# Patient Record
Sex: Female | Born: 1945 | Race: White | Hispanic: Yes | Marital: Married | Smoking: Never smoker
Health system: Southern US, Community
[De-identification: ages and names within clinical notes are randomized; demographics above are authoritative.]

## PROBLEM LIST (undated history)

## (undated) ENCOUNTER — Emergency Department (HOSPITAL_COMMUNITY): Discharge status: Absent without leave | Payer: Medicare (Managed Care)

## (undated) DIAGNOSIS — K219 Gastro-esophageal reflux disease without esophagitis: Secondary | ICD-10-CM

## (undated) DIAGNOSIS — E7800 Pure hypercholesterolemia, unspecified: Secondary | ICD-10-CM

## (undated) DIAGNOSIS — I10 Essential (primary) hypertension: Secondary | ICD-10-CM

## (undated) DIAGNOSIS — G8929 Other chronic pain: Secondary | ICD-10-CM

## (undated) DIAGNOSIS — N200 Calculus of kidney: Secondary | ICD-10-CM

## (undated) DIAGNOSIS — E079 Disorder of thyroid, unspecified: Secondary | ICD-10-CM

## (undated) DIAGNOSIS — G459 Transient cerebral ischemic attack, unspecified: Secondary | ICD-10-CM

## (undated) DIAGNOSIS — Z87442 Personal history of urinary calculi: Secondary | ICD-10-CM

## (undated) DIAGNOSIS — M549 Dorsalgia, unspecified: Secondary | ICD-10-CM

## (undated) DIAGNOSIS — M1990 Unspecified osteoarthritis, unspecified site: Secondary | ICD-10-CM

## (undated) DIAGNOSIS — F410 Panic disorder [episodic paroxysmal anxiety] without agoraphobia: Secondary | ICD-10-CM

## (undated) DIAGNOSIS — F329 Major depressive disorder, single episode, unspecified: Secondary | ICD-10-CM

## (undated) DIAGNOSIS — F449 Dissociative and conversion disorder, unspecified: Secondary | ICD-10-CM

## (undated) DIAGNOSIS — F32A Depression, unspecified: Secondary | ICD-10-CM

## (undated) DIAGNOSIS — Z86718 Personal history of other venous thrombosis and embolism: Secondary | ICD-10-CM

## (undated) HISTORY — PX: THYROID SURGERY: SHX805

## (undated) HISTORY — PX: CYSTOSCOPY: SUR368

## (undated) HISTORY — PX: ABDOMINAL HYSTERECTOMY: SHX81

## (undated) HISTORY — PX: APPENDECTOMY: SHX54

## (undated) HISTORY — PX: CHOLECYSTECTOMY: SHX55

---

## 1968-07-23 HISTORY — PX: APPENDECTOMY: SHX54

## 1977-07-23 HISTORY — PX: STERILIZATION: SHX533

## 1978-07-23 HISTORY — PX: OVARY SURGERY: SHX727

## 1982-07-23 HISTORY — PX: LAPAROSCOPY: SHX197

## 1988-07-23 HISTORY — PX: CYSTOSCOPY: SUR368

## 2005-09-10 ENCOUNTER — Ambulatory Visit: Payer: Self-pay | Admitting: Family Medicine

## 2005-09-11 ENCOUNTER — Ambulatory Visit: Payer: Self-pay | Admitting: Family Medicine

## 2005-09-12 ENCOUNTER — Ambulatory Visit: Payer: Self-pay | Admitting: *Deleted

## 2005-10-08 ENCOUNTER — Ambulatory Visit: Payer: Self-pay | Admitting: Family Medicine

## 2005-12-21 DIAGNOSIS — Z8711 Personal history of peptic ulcer disease: Secondary | ICD-10-CM | POA: Insufficient documentation

## 2006-01-16 ENCOUNTER — Ambulatory Visit: Payer: Self-pay | Admitting: Family Medicine

## 2006-01-24 ENCOUNTER — Ambulatory Visit: Payer: Self-pay | Admitting: Family Medicine

## 2006-03-28 ENCOUNTER — Ambulatory Visit: Payer: Self-pay | Admitting: Family Medicine

## 2006-05-21 ENCOUNTER — Ambulatory Visit: Payer: Self-pay | Admitting: Family Medicine

## 2006-06-26 ENCOUNTER — Ambulatory Visit: Payer: Self-pay | Admitting: Family Medicine

## 2006-09-12 ENCOUNTER — Ambulatory Visit (HOSPITAL_COMMUNITY): Admission: RE | Admit: 2006-09-12 | Discharge: 2006-09-12 | Payer: Self-pay | Admitting: Family Medicine

## 2007-03-03 ENCOUNTER — Ambulatory Visit: Payer: Self-pay | Admitting: Internal Medicine

## 2007-03-03 DIAGNOSIS — S139XXA Sprain of joints and ligaments of unspecified parts of neck, initial encounter: Secondary | ICD-10-CM | POA: Insufficient documentation

## 2007-03-03 DIAGNOSIS — G43909 Migraine, unspecified, not intractable, without status migrainosus: Secondary | ICD-10-CM | POA: Insufficient documentation

## 2007-03-04 ENCOUNTER — Ambulatory Visit (HOSPITAL_COMMUNITY): Admission: RE | Admit: 2007-03-04 | Discharge: 2007-03-04 | Payer: Self-pay | Admitting: Internal Medicine

## 2007-03-04 ENCOUNTER — Ambulatory Visit: Payer: Self-pay | Admitting: Internal Medicine

## 2007-03-04 ENCOUNTER — Encounter (INDEPENDENT_AMBULATORY_CARE_PROVIDER_SITE_OTHER): Payer: Self-pay | Admitting: Nurse Practitioner

## 2007-03-04 LAB — CONVERTED CEMR LAB
ALT: 12 units/L (ref 0–35)
Basophils Absolute: 0 10*3/uL (ref 0.0–0.1)
CO2: 23 meq/L (ref 19–32)
Chloride: 104 meq/L (ref 96–112)
Eosinophils Relative: 3 % (ref 0–5)
Lymphocytes Relative: 16 % (ref 12–46)
Neutro Abs: 5.9 10*3/uL (ref 1.7–7.7)
Neutrophils Relative %: 75 % (ref 43–77)
Platelets: 271 10*3/uL (ref 150–400)
RDW: 14.5 % — ABNORMAL HIGH (ref 11.5–14.0)
Sodium: 140 meq/L (ref 135–145)
TSH: 0.979 microintl units/mL (ref 0.350–5.50)
Total Bilirubin: 0.8 mg/dL (ref 0.3–1.2)
Total Protein: 7.2 g/dL (ref 6.0–8.3)

## 2007-03-05 ENCOUNTER — Encounter (INDEPENDENT_AMBULATORY_CARE_PROVIDER_SITE_OTHER): Payer: Self-pay | Admitting: Nurse Practitioner

## 2007-03-07 ENCOUNTER — Encounter (INDEPENDENT_AMBULATORY_CARE_PROVIDER_SITE_OTHER): Payer: Self-pay | Admitting: Nurse Practitioner

## 2007-03-07 DIAGNOSIS — M1710 Unilateral primary osteoarthritis, unspecified knee: Secondary | ICD-10-CM | POA: Insufficient documentation

## 2007-03-07 DIAGNOSIS — K219 Gastro-esophageal reflux disease without esophagitis: Secondary | ICD-10-CM | POA: Insufficient documentation

## 2007-03-07 DIAGNOSIS — E785 Hyperlipidemia, unspecified: Secondary | ICD-10-CM | POA: Insufficient documentation

## 2007-03-17 ENCOUNTER — Telehealth (INDEPENDENT_AMBULATORY_CARE_PROVIDER_SITE_OTHER): Payer: Self-pay | Admitting: *Deleted

## 2007-04-08 ENCOUNTER — Telehealth (INDEPENDENT_AMBULATORY_CARE_PROVIDER_SITE_OTHER): Payer: Self-pay | Admitting: *Deleted

## 2007-04-09 ENCOUNTER — Encounter (INDEPENDENT_AMBULATORY_CARE_PROVIDER_SITE_OTHER): Payer: Self-pay | Admitting: *Deleted

## 2007-05-21 ENCOUNTER — Telehealth (INDEPENDENT_AMBULATORY_CARE_PROVIDER_SITE_OTHER): Payer: Self-pay | Admitting: *Deleted

## 2007-05-30 ENCOUNTER — Ambulatory Visit: Payer: Self-pay | Admitting: Nurse Practitioner

## 2007-05-31 ENCOUNTER — Encounter (INDEPENDENT_AMBULATORY_CARE_PROVIDER_SITE_OTHER): Payer: Self-pay | Admitting: Nurse Practitioner

## 2007-06-16 ENCOUNTER — Emergency Department (HOSPITAL_COMMUNITY): Admission: EM | Admit: 2007-06-16 | Discharge: 2007-06-16 | Payer: Self-pay | Admitting: Emergency Medicine

## 2007-06-27 ENCOUNTER — Inpatient Hospital Stay (HOSPITAL_COMMUNITY): Admission: EM | Admit: 2007-06-27 | Discharge: 2007-06-29 | Payer: Self-pay | Admitting: Emergency Medicine

## 2007-07-09 ENCOUNTER — Ambulatory Visit: Payer: Self-pay | Admitting: Nurse Practitioner

## 2007-07-09 DIAGNOSIS — M26629 Arthralgia of temporomandibular joint, unspecified side: Secondary | ICD-10-CM | POA: Insufficient documentation

## 2007-07-23 ENCOUNTER — Ambulatory Visit: Payer: Self-pay | Admitting: Nurse Practitioner

## 2007-09-01 ENCOUNTER — Encounter (INDEPENDENT_AMBULATORY_CARE_PROVIDER_SITE_OTHER): Payer: Self-pay | Admitting: Nurse Practitioner

## 2007-09-09 ENCOUNTER — Ambulatory Visit: Payer: Self-pay | Admitting: Nurse Practitioner

## 2007-09-11 ENCOUNTER — Encounter (INDEPENDENT_AMBULATORY_CARE_PROVIDER_SITE_OTHER): Payer: Self-pay | Admitting: Nurse Practitioner

## 2007-09-17 ENCOUNTER — Encounter (INDEPENDENT_AMBULATORY_CARE_PROVIDER_SITE_OTHER): Payer: Self-pay | Admitting: Nurse Practitioner

## 2007-09-17 LAB — CONVERTED CEMR LAB
ALT: 15 units/L (ref 0–35)
AST: 16 units/L (ref 0–37)
Alkaline Phosphatase: 139 units/L — ABNORMAL HIGH (ref 39–117)
Basophils Absolute: 0.1 10*3/uL (ref 0.0–0.1)
Basophils Relative: 1 % (ref 0–1)
Chloride: 98 meq/L (ref 96–112)
Creatinine, Ser: 0.56 mg/dL (ref 0.40–1.20)
Eosinophils Relative: 7 % — ABNORMAL HIGH (ref 0–5)
Hemoglobin: 15.1 g/dL — ABNORMAL HIGH (ref 12.0–15.0)
LDL Cholesterol: 214 mg/dL — ABNORMAL HIGH (ref 0–99)
MCHC: 32.3 g/dL (ref 30.0–36.0)
Monocytes Absolute: 0.4 10*3/uL (ref 0.1–1.0)
Neutro Abs: 4.6 10*3/uL (ref 1.7–7.7)
RDW: 14.7 % (ref 11.5–15.5)
TSH: 1.058 microintl units/mL (ref 0.350–5.50)
Total Bilirubin: 0.7 mg/dL (ref 0.3–1.2)
Total CHOL/HDL Ratio: 5
VLDL: 41 mg/dL — ABNORMAL HIGH (ref 0–40)

## 2007-11-07 ENCOUNTER — Ambulatory Visit: Payer: Self-pay | Admitting: Nurse Practitioner

## 2007-11-13 ENCOUNTER — Ambulatory Visit (HOSPITAL_COMMUNITY): Admission: RE | Admit: 2007-11-13 | Discharge: 2007-11-13 | Payer: Self-pay | Admitting: Internal Medicine

## 2007-11-21 ENCOUNTER — Encounter: Admission: RE | Admit: 2007-11-21 | Discharge: 2007-11-21 | Payer: Self-pay | Admitting: Internal Medicine

## 2008-01-08 ENCOUNTER — Ambulatory Visit: Payer: Self-pay | Admitting: Nurse Practitioner

## 2008-01-08 DIAGNOSIS — K5289 Other specified noninfective gastroenteritis and colitis: Secondary | ICD-10-CM | POA: Insufficient documentation

## 2008-01-08 DIAGNOSIS — R319 Hematuria, unspecified: Secondary | ICD-10-CM | POA: Insufficient documentation

## 2008-01-08 LAB — CONVERTED CEMR LAB
Specific Gravity, Urine: 1.015
pH: 6

## 2008-01-09 ENCOUNTER — Encounter (INDEPENDENT_AMBULATORY_CARE_PROVIDER_SITE_OTHER): Payer: Self-pay | Admitting: Nurse Practitioner

## 2008-01-10 ENCOUNTER — Encounter (INDEPENDENT_AMBULATORY_CARE_PROVIDER_SITE_OTHER): Payer: Self-pay | Admitting: Nurse Practitioner

## 2008-01-16 ENCOUNTER — Emergency Department (HOSPITAL_COMMUNITY): Admission: EM | Admit: 2008-01-16 | Discharge: 2008-01-16 | Payer: Self-pay | Admitting: Emergency Medicine

## 2008-01-16 LAB — CONVERTED CEMR LAB
Calcium: 1.2 mg/dL
Creatinine, Ser: 1 mg/dL
Glucose, Bld: 87 mg/dL
Hemoglobin: 14.6 g/dL

## 2008-01-20 ENCOUNTER — Encounter (INDEPENDENT_AMBULATORY_CARE_PROVIDER_SITE_OTHER): Payer: Self-pay | Admitting: *Deleted

## 2008-02-06 ENCOUNTER — Encounter (INDEPENDENT_AMBULATORY_CARE_PROVIDER_SITE_OTHER): Payer: Self-pay | Admitting: *Deleted

## 2008-03-18 ENCOUNTER — Ambulatory Visit: Payer: Self-pay | Admitting: Nurse Practitioner

## 2008-03-18 DIAGNOSIS — N200 Calculus of kidney: Secondary | ICD-10-CM | POA: Insufficient documentation

## 2008-03-18 LAB — CONVERTED CEMR LAB
Glucose, Urine, Semiquant: NEGATIVE
Nitrite: NEGATIVE
Protein, U semiquant: 100
Specific Gravity, Urine: >=1.03
WBC Urine, dipstick: NEGATIVE
pH: 5

## 2008-03-23 ENCOUNTER — Encounter (INDEPENDENT_AMBULATORY_CARE_PROVIDER_SITE_OTHER): Payer: Self-pay | Admitting: Nurse Practitioner

## 2008-03-24 ENCOUNTER — Ambulatory Visit (HOSPITAL_COMMUNITY): Admission: RE | Admit: 2008-03-24 | Discharge: 2008-03-24 | Payer: Self-pay | Admitting: Urology

## 2008-03-31 ENCOUNTER — Telehealth (INDEPENDENT_AMBULATORY_CARE_PROVIDER_SITE_OTHER): Payer: Self-pay | Admitting: Nurse Practitioner

## 2008-05-11 ENCOUNTER — Emergency Department (HOSPITAL_COMMUNITY): Admission: EM | Admit: 2008-05-11 | Discharge: 2008-05-11 | Payer: Self-pay | Admitting: Emergency Medicine

## 2008-06-21 ENCOUNTER — Telehealth (INDEPENDENT_AMBULATORY_CARE_PROVIDER_SITE_OTHER): Payer: Self-pay | Admitting: Nurse Practitioner

## 2008-06-24 ENCOUNTER — Ambulatory Visit: Payer: Self-pay | Admitting: Nurse Practitioner

## 2008-06-24 LAB — CONVERTED CEMR LAB
Cholesterol, target level: 200 mg/dL
Urobilinogen, UA: 1

## 2008-06-30 LAB — CONVERTED CEMR LAB
AST: 20 units/L (ref 0–37)
Albumin: 4.5 g/dL (ref 3.5–5.2)
Alkaline Phosphatase: 152 units/L — ABNORMAL HIGH (ref 39–117)
BUN: 18 mg/dL (ref 6–23)
Basophils Relative: 1 % (ref 0–1)
Creatinine, Ser: 0.68 mg/dL (ref 0.40–1.20)
Eosinophils Absolute: 0.3 10*3/uL (ref 0.0–0.7)
Eosinophils Relative: 4 % (ref 0–5)
Glucose, Bld: 97 mg/dL (ref 70–99)
HCT: 47.8 % — ABNORMAL HIGH (ref 36.0–46.0)
HDL: 59 mg/dL (ref >39)
Hemoglobin: 15.4 g/dL — ABNORMAL HIGH (ref 12.0–15.0)
LDL Cholesterol: 189 mg/dL — ABNORMAL HIGH (ref 0–99)
Lymphs Abs: 1.8 10*3/uL (ref 0.7–4.0)
MCHC: 32.2 g/dL (ref 30.0–36.0)
MCV: 97.6 fL (ref 78.0–100.0)
Monocytes Absolute: 0.5 10*3/uL (ref 0.1–1.0)
Monocytes Relative: 7 % (ref 3–12)
Neutrophils Relative %: 62 % (ref 43–77)
RBC: 4.9 M/uL (ref 3.87–5.11)
Total Bilirubin: 0.7 mg/dL (ref 0.3–1.2)
Total CHOL/HDL Ratio: 4.8
Triglycerides: 179 mg/dL — ABNORMAL HIGH (ref <150)
WBC: 7 10*3/uL (ref 4.0–10.5)

## 2008-07-01 ENCOUNTER — Telehealth (INDEPENDENT_AMBULATORY_CARE_PROVIDER_SITE_OTHER): Payer: Self-pay | Admitting: Nurse Practitioner

## 2008-07-13 ENCOUNTER — Emergency Department (HOSPITAL_COMMUNITY): Admission: EM | Admit: 2008-07-13 | Discharge: 2008-07-13 | Payer: Self-pay | Admitting: Emergency Medicine

## 2008-07-14 ENCOUNTER — Telehealth (INDEPENDENT_AMBULATORY_CARE_PROVIDER_SITE_OTHER): Payer: Self-pay | Admitting: Nurse Practitioner

## 2008-07-14 DIAGNOSIS — M899 Disorder of bone, unspecified: Secondary | ICD-10-CM | POA: Insufficient documentation

## 2008-07-14 DIAGNOSIS — M7730 Calcaneal spur, unspecified foot: Secondary | ICD-10-CM | POA: Insufficient documentation

## 2008-07-14 DIAGNOSIS — M949 Disorder of cartilage, unspecified: Secondary | ICD-10-CM

## 2008-07-23 HISTORY — PX: KIDNEY STONE SURGERY: SHX686

## 2008-07-23 HISTORY — PX: THYROID SURGERY: SHX805

## 2008-07-30 ENCOUNTER — Encounter (INDEPENDENT_AMBULATORY_CARE_PROVIDER_SITE_OTHER): Payer: Self-pay | Admitting: Nurse Practitioner

## 2008-08-03 ENCOUNTER — Ambulatory Visit: Payer: Self-pay | Admitting: Nurse Practitioner

## 2008-08-03 DIAGNOSIS — M545 Low back pain, unspecified: Secondary | ICD-10-CM | POA: Insufficient documentation

## 2008-09-29 ENCOUNTER — Ambulatory Visit: Payer: Self-pay | Admitting: Nurse Practitioner

## 2008-09-29 DIAGNOSIS — H8309 Labyrinthitis, unspecified ear: Secondary | ICD-10-CM | POA: Insufficient documentation

## 2009-01-11 ENCOUNTER — Ambulatory Visit: Payer: Self-pay | Admitting: Nurse Practitioner

## 2009-01-11 DIAGNOSIS — M25569 Pain in unspecified knee: Secondary | ICD-10-CM | POA: Insufficient documentation

## 2009-01-17 LAB — CONVERTED CEMR LAB
ALT: 23 units/L (ref 0–35)
AST: 28 units/L (ref 0–37)
Albumin: 4.3 g/dL (ref 3.5–5.2)
Alkaline Phosphatase: 140 units/L — ABNORMAL HIGH (ref 39–117)
BUN: 19 mg/dL (ref 6–23)
CO2: 27 meq/L (ref 19–32)
Calcium: 10.2 mg/dL (ref 8.4–10.5)
Chloride: 100 meq/L (ref 96–112)
Cholesterol: 321 mg/dL — ABNORMAL HIGH (ref 0–200)
Creatinine, Ser: 0.67 mg/dL (ref 0.40–1.20)
Glucose, Bld: 99 mg/dL (ref 70–99)
HDL: 58 mg/dL (ref >39)
LDL Cholesterol: 219 mg/dL — ABNORMAL HIGH (ref 0–99)
Potassium: 4.8 meq/L (ref 3.5–5.3)
Sodium: 140 meq/L (ref 135–145)
TSH: 0.809 microintl units/mL (ref 0.350–4.500)
Total Bilirubin: 0.6 mg/dL (ref 0.3–1.2)
Total CHOL/HDL Ratio: 5.5
Total Protein: 7.4 g/dL (ref 6.0–8.3)
Triglycerides: 221 mg/dL — ABNORMAL HIGH (ref <150)
VLDL: 44 mg/dL — ABNORMAL HIGH (ref 0–40)

## 2009-01-18 ENCOUNTER — Encounter (INDEPENDENT_AMBULATORY_CARE_PROVIDER_SITE_OTHER): Payer: Self-pay | Admitting: Nurse Practitioner

## 2009-01-18 ENCOUNTER — Ambulatory Visit (HOSPITAL_COMMUNITY): Admission: RE | Admit: 2009-01-18 | Discharge: 2009-01-18 | Payer: Self-pay | Admitting: Internal Medicine

## 2009-01-20 ENCOUNTER — Encounter (INDEPENDENT_AMBULATORY_CARE_PROVIDER_SITE_OTHER): Payer: Self-pay | Admitting: Nurse Practitioner

## 2009-01-27 IMAGING — CT CT HEAD W/O CM
1 series · 16 of 30 positions shown, 20 images · non-contrast
Comparison: none

CLINICAL DATA: Headache

[Series 2: head routine 4.8 h47s · axial · 0.45mm/px · z∈[-127,+10]mm · 16 of 30 slices shown, 20 images]
[im 2/30  brain]
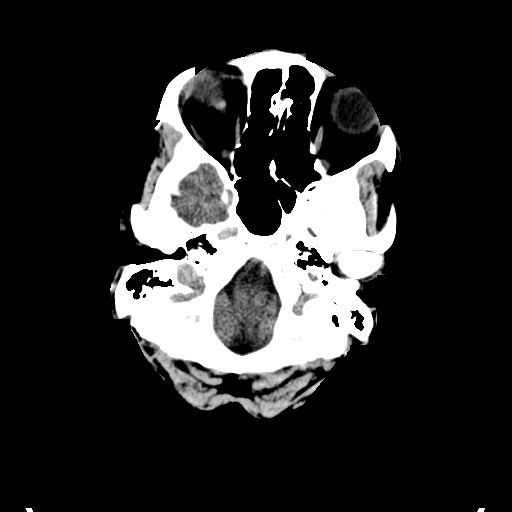
[im 2/30  bone]
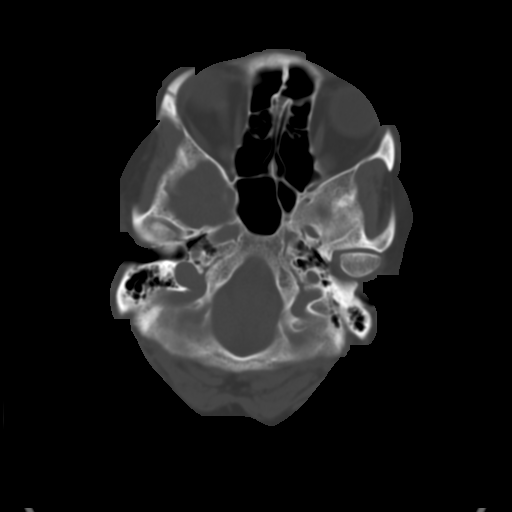
[im 4/30  brain]
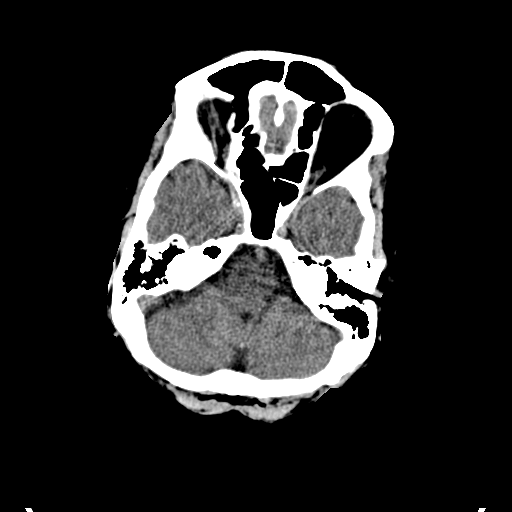
[im 6/30  brain]
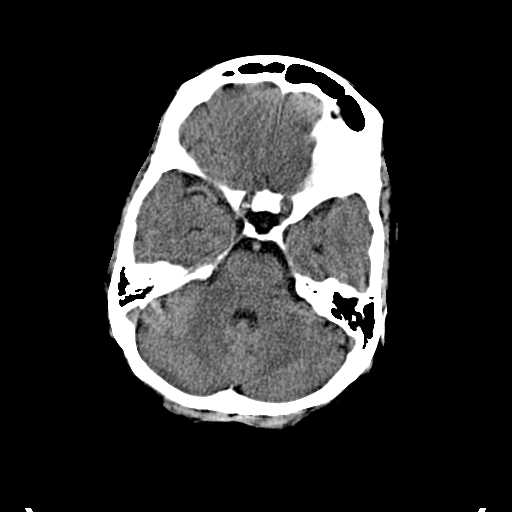
[im 8/30  brain]
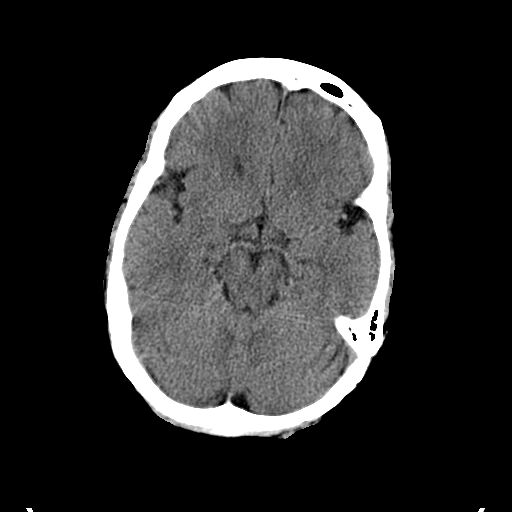
[im 9/30  brain]
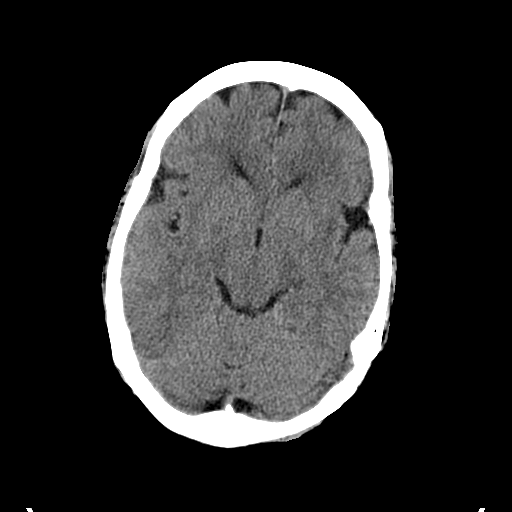
[im 9/30  bone]
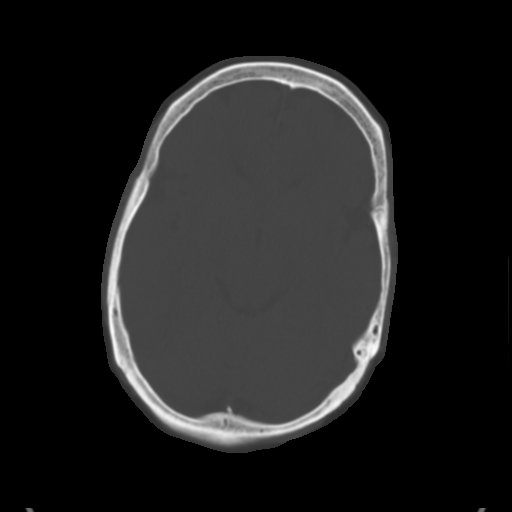
[im 11/30  brain]
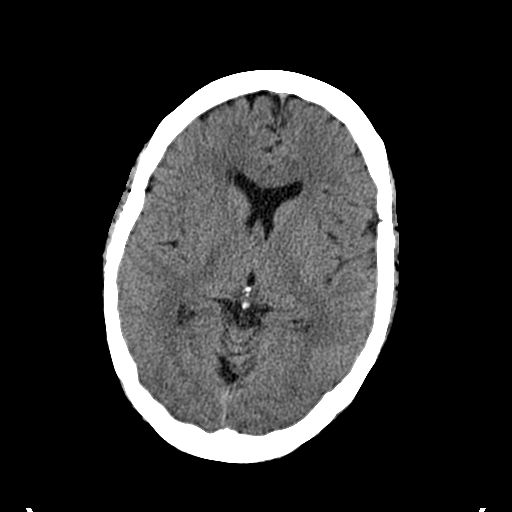
[im 13/30  brain]
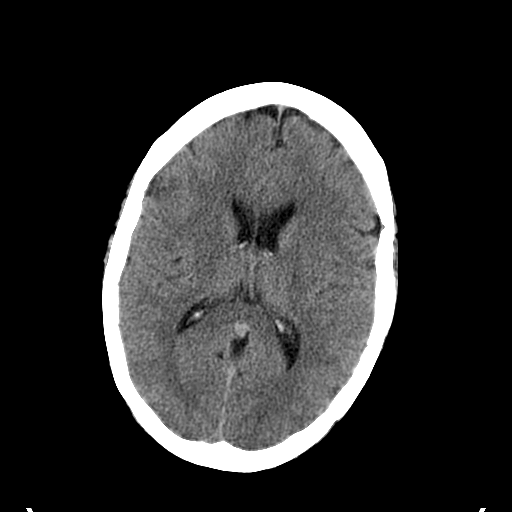
[im 15/30  brain]
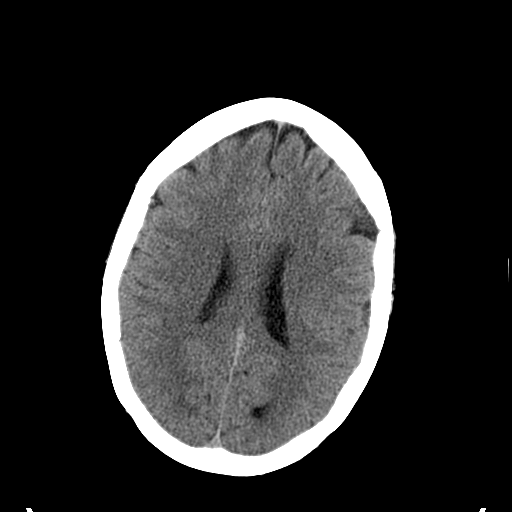
[im 16/30  brain]
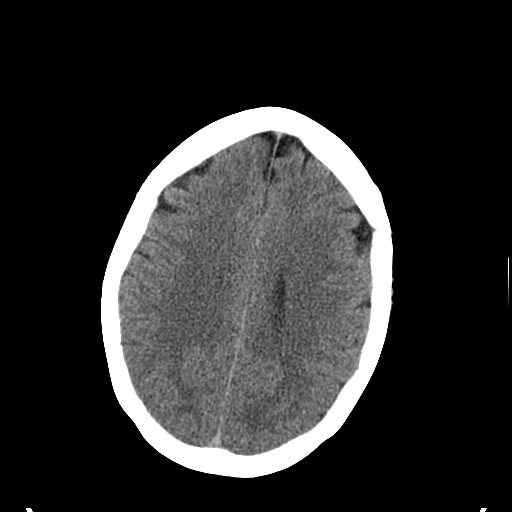
[im 16/30  bone]
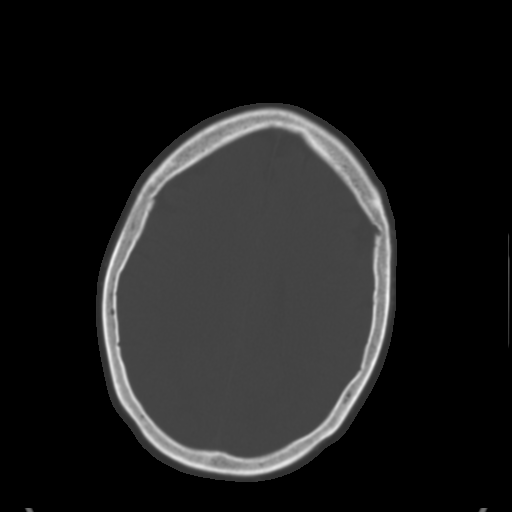
[im 18/30  brain]
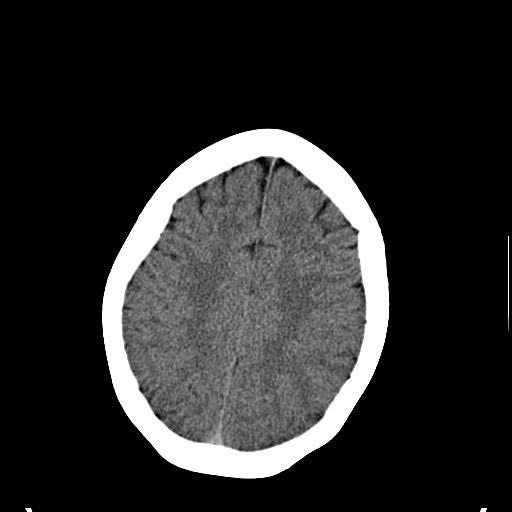
[im 20/30  brain]
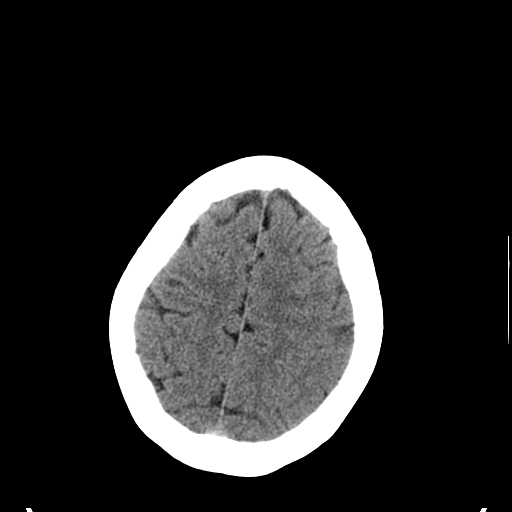
[im 22/30  brain]
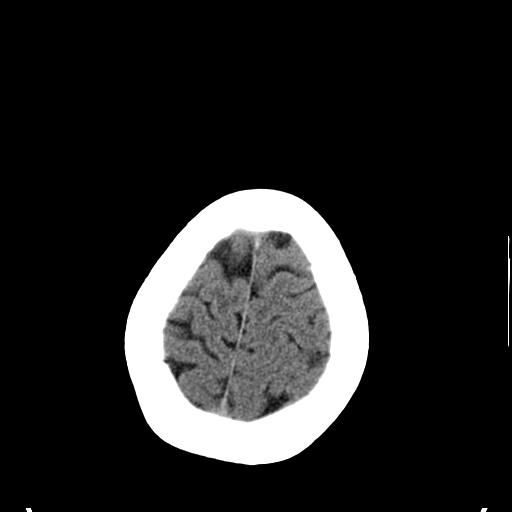
[im 23/30  brain]
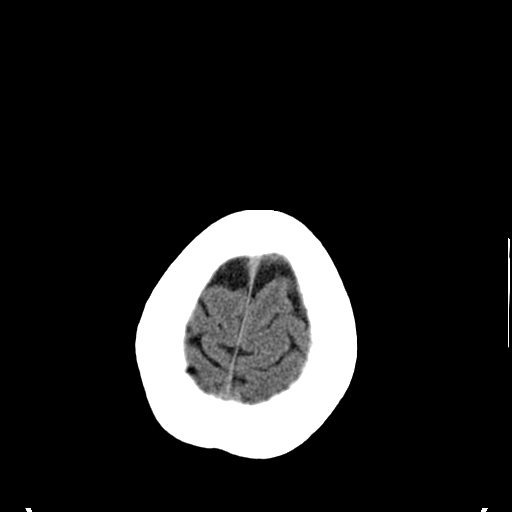
[im 23/30  bone]
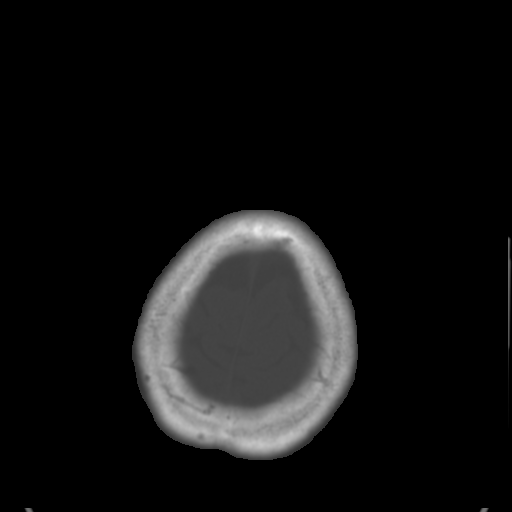
[im 25/30  brain]
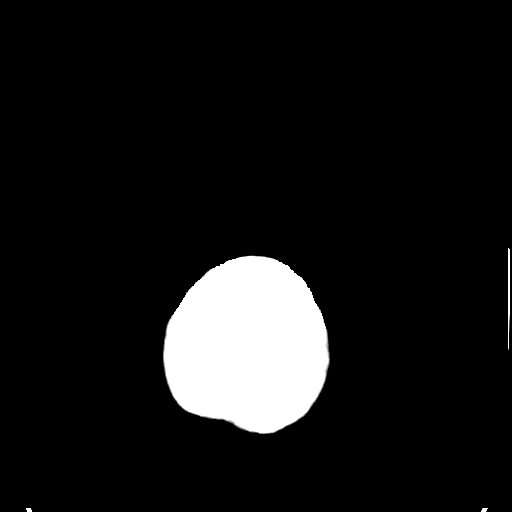
[im 27/30  brain]
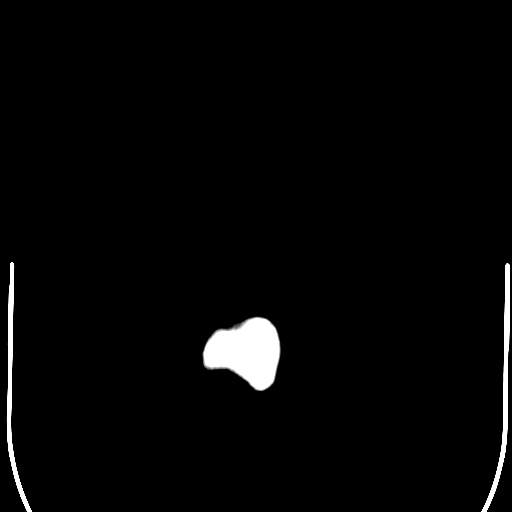
[im 29/30  brain]
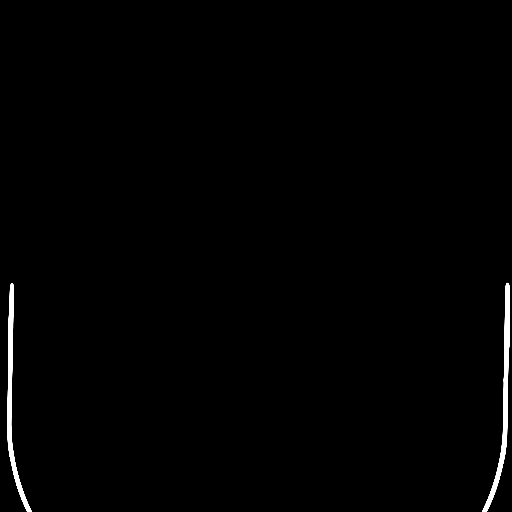

[16 of 30 positions shown; findings below may reference images not displayed]

CT head without contrast:

No previous for comparison.  Mild diffuse parenchymal atrophy. Patchy areas of
hypoattenuation in  periventricular white matter most marked in the frontal
regions bilaterally. Negative for acute intracranial hemorrhage, midline shift,
or mass-effect. Acute infarct may be inapparent on noncontrast CT. Ventricles
and sulci symmetric. Bone windows demonstrate no focal lesion.
IMPRESSION: 1. Negative for bleed or other acute intracranial process.
2. Early atrophy and nonspecific white matter changes

## 2009-03-14 ENCOUNTER — Telehealth (INDEPENDENT_AMBULATORY_CARE_PROVIDER_SITE_OTHER): Payer: Self-pay | Admitting: Nurse Practitioner

## 2009-04-04 ENCOUNTER — Telehealth (INDEPENDENT_AMBULATORY_CARE_PROVIDER_SITE_OTHER): Payer: Self-pay | Admitting: Nurse Practitioner

## 2009-04-13 ENCOUNTER — Emergency Department (HOSPITAL_COMMUNITY): Admission: EM | Admit: 2009-04-13 | Discharge: 2009-04-13 | Payer: Self-pay | Admitting: Emergency Medicine

## 2009-04-15 ENCOUNTER — Telehealth (INDEPENDENT_AMBULATORY_CARE_PROVIDER_SITE_OTHER): Payer: Self-pay | Admitting: Nurse Practitioner

## 2009-04-27 ENCOUNTER — Ambulatory Visit: Payer: Self-pay | Admitting: Nurse Practitioner

## 2009-04-27 LAB — CONVERTED CEMR LAB
Protein, U semiquant: NEGATIVE
Urobilinogen, UA: 0.2

## 2009-06-01 ENCOUNTER — Encounter (INDEPENDENT_AMBULATORY_CARE_PROVIDER_SITE_OTHER): Payer: Self-pay | Admitting: Nurse Practitioner

## 2009-06-03 ENCOUNTER — Encounter (INDEPENDENT_AMBULATORY_CARE_PROVIDER_SITE_OTHER): Payer: Self-pay | Admitting: Nurse Practitioner

## 2009-06-08 ENCOUNTER — Telehealth (INDEPENDENT_AMBULATORY_CARE_PROVIDER_SITE_OTHER): Payer: Self-pay | Admitting: Nurse Practitioner

## 2009-06-20 ENCOUNTER — Encounter (INDEPENDENT_AMBULATORY_CARE_PROVIDER_SITE_OTHER): Payer: Self-pay | Admitting: Nurse Practitioner

## 2009-06-22 ENCOUNTER — Encounter (INDEPENDENT_AMBULATORY_CARE_PROVIDER_SITE_OTHER): Payer: Self-pay | Admitting: Nurse Practitioner

## 2009-07-13 ENCOUNTER — Encounter (INDEPENDENT_AMBULATORY_CARE_PROVIDER_SITE_OTHER): Payer: Self-pay | Admitting: Nurse Practitioner

## 2009-07-19 ENCOUNTER — Encounter (INDEPENDENT_AMBULATORY_CARE_PROVIDER_SITE_OTHER): Payer: Self-pay | Admitting: Nurse Practitioner

## 2009-07-19 DIAGNOSIS — E213 Hyperparathyroidism, unspecified: Secondary | ICD-10-CM | POA: Insufficient documentation

## 2009-07-21 ENCOUNTER — Encounter (INDEPENDENT_AMBULATORY_CARE_PROVIDER_SITE_OTHER): Payer: Self-pay | Admitting: Nurse Practitioner

## 2009-07-23 HISTORY — PX: CHOLECYSTECTOMY: SHX55

## 2009-07-25 ENCOUNTER — Encounter (INDEPENDENT_AMBULATORY_CARE_PROVIDER_SITE_OTHER): Payer: Self-pay | Admitting: Nurse Practitioner

## 2009-08-25 ENCOUNTER — Encounter (INDEPENDENT_AMBULATORY_CARE_PROVIDER_SITE_OTHER): Payer: Self-pay | Admitting: Nurse Practitioner

## 2009-08-26 ENCOUNTER — Telehealth (INDEPENDENT_AMBULATORY_CARE_PROVIDER_SITE_OTHER): Payer: Self-pay | Admitting: Nurse Practitioner

## 2009-09-16 ENCOUNTER — Encounter (INDEPENDENT_AMBULATORY_CARE_PROVIDER_SITE_OTHER): Payer: Self-pay | Admitting: Nurse Practitioner

## 2009-10-12 ENCOUNTER — Ambulatory Visit: Payer: Self-pay | Admitting: Nurse Practitioner

## 2009-10-12 DIAGNOSIS — J309 Allergic rhinitis, unspecified: Secondary | ICD-10-CM | POA: Insufficient documentation

## 2009-10-12 DIAGNOSIS — G4733 Obstructive sleep apnea (adult) (pediatric): Secondary | ICD-10-CM | POA: Insufficient documentation

## 2009-10-12 DIAGNOSIS — G4730 Sleep apnea, unspecified: Secondary | ICD-10-CM

## 2009-10-13 ENCOUNTER — Encounter (INDEPENDENT_AMBULATORY_CARE_PROVIDER_SITE_OTHER): Payer: Self-pay | Admitting: Nurse Practitioner

## 2009-10-19 ENCOUNTER — Encounter (INDEPENDENT_AMBULATORY_CARE_PROVIDER_SITE_OTHER): Payer: Self-pay | Admitting: Nurse Practitioner

## 2009-10-20 ENCOUNTER — Encounter (INDEPENDENT_AMBULATORY_CARE_PROVIDER_SITE_OTHER): Payer: Self-pay | Admitting: Nurse Practitioner

## 2009-10-21 ENCOUNTER — Encounter (INDEPENDENT_AMBULATORY_CARE_PROVIDER_SITE_OTHER): Payer: Self-pay | Admitting: Nurse Practitioner

## 2009-10-24 LAB — CONVERTED CEMR LAB
Bilirubin, Direct: 0.1 mg/dL (ref 0.0–0.3)
Indirect Bilirubin: 0.3 mg/dL (ref 0.0–0.9)
LDL Cholesterol: 194 mg/dL — ABNORMAL HIGH (ref 0–99)
Total Bilirubin: 0.4 mg/dL (ref 0.3–1.2)
Total CHOL/HDL Ratio: 5.2
VLDL: 45 mg/dL — ABNORMAL HIGH (ref 0–40)

## 2009-10-28 ENCOUNTER — Encounter (INDEPENDENT_AMBULATORY_CARE_PROVIDER_SITE_OTHER): Payer: Self-pay | Admitting: Nurse Practitioner

## 2009-10-28 DIAGNOSIS — I10 Essential (primary) hypertension: Secondary | ICD-10-CM | POA: Insufficient documentation

## 2009-10-31 ENCOUNTER — Telehealth (INDEPENDENT_AMBULATORY_CARE_PROVIDER_SITE_OTHER): Payer: Self-pay | Admitting: Nurse Practitioner

## 2009-11-10 ENCOUNTER — Encounter (INDEPENDENT_AMBULATORY_CARE_PROVIDER_SITE_OTHER): Payer: Self-pay | Admitting: Nurse Practitioner

## 2009-11-15 ENCOUNTER — Encounter (INDEPENDENT_AMBULATORY_CARE_PROVIDER_SITE_OTHER): Payer: Self-pay | Admitting: *Deleted

## 2010-02-03 ENCOUNTER — Emergency Department (HOSPITAL_COMMUNITY): Admission: EM | Admit: 2010-02-03 | Discharge: 2010-02-04 | Payer: Self-pay | Admitting: Emergency Medicine

## 2010-02-10 ENCOUNTER — Telehealth (INDEPENDENT_AMBULATORY_CARE_PROVIDER_SITE_OTHER): Payer: Self-pay | Admitting: Nurse Practitioner

## 2010-03-17 ENCOUNTER — Ambulatory Visit: Payer: Self-pay | Admitting: Nurse Practitioner

## 2010-03-17 DIAGNOSIS — F329 Major depressive disorder, single episode, unspecified: Secondary | ICD-10-CM

## 2010-04-19 ENCOUNTER — Ambulatory Visit: Payer: Self-pay | Admitting: Nurse Practitioner

## 2010-04-20 ENCOUNTER — Ambulatory Visit: Payer: Self-pay | Admitting: Cardiology

## 2010-04-21 ENCOUNTER — Encounter: Payer: Self-pay | Admitting: Cardiology

## 2010-04-21 ENCOUNTER — Inpatient Hospital Stay (HOSPITAL_COMMUNITY): Admission: EM | Admit: 2010-04-21 | Discharge: 2010-04-25 | Payer: Self-pay | Admitting: Emergency Medicine

## 2010-04-24 ENCOUNTER — Encounter (INDEPENDENT_AMBULATORY_CARE_PROVIDER_SITE_OTHER): Payer: Self-pay

## 2010-04-25 DIAGNOSIS — Z9089 Acquired absence of other organs: Secondary | ICD-10-CM | POA: Insufficient documentation

## 2010-06-01 ENCOUNTER — Telehealth (INDEPENDENT_AMBULATORY_CARE_PROVIDER_SITE_OTHER): Payer: Self-pay | Admitting: Nurse Practitioner

## 2010-06-02 ENCOUNTER — Ambulatory Visit: Payer: Self-pay | Admitting: Nurse Practitioner

## 2010-06-13 ENCOUNTER — Ambulatory Visit (HOSPITAL_COMMUNITY)
Admission: RE | Admit: 2010-06-13 | Discharge: 2010-06-13 | Payer: Self-pay | Source: Home / Self Care | Admitting: Internal Medicine

## 2010-06-29 ENCOUNTER — Ambulatory Visit: Payer: Self-pay | Admitting: Nurse Practitioner

## 2010-07-06 ENCOUNTER — Telehealth (INDEPENDENT_AMBULATORY_CARE_PROVIDER_SITE_OTHER): Payer: Self-pay | Admitting: Nurse Practitioner

## 2010-07-12 ENCOUNTER — Ambulatory Visit: Payer: Self-pay | Admitting: Nurse Practitioner

## 2010-07-12 DIAGNOSIS — E669 Obesity, unspecified: Secondary | ICD-10-CM

## 2010-07-19 ENCOUNTER — Ambulatory Visit: Payer: Self-pay | Admitting: Nurse Practitioner

## 2010-07-25 ENCOUNTER — Encounter (INDEPENDENT_AMBULATORY_CARE_PROVIDER_SITE_OTHER): Payer: Self-pay | Admitting: Nurse Practitioner

## 2010-07-25 ENCOUNTER — Ambulatory Visit
Admission: RE | Admit: 2010-07-25 | Discharge: 2010-07-25 | Payer: Self-pay | Source: Home / Self Care | Attending: Nurse Practitioner | Admitting: Nurse Practitioner

## 2010-07-27 ENCOUNTER — Encounter (INDEPENDENT_AMBULATORY_CARE_PROVIDER_SITE_OTHER): Payer: Self-pay | Admitting: Nurse Practitioner

## 2010-07-27 LAB — CONVERTED CEMR LAB
Albumin: 4.3 g/dL (ref 3.5–5.2)
Alkaline Phosphatase: 121 units/L — ABNORMAL HIGH (ref 39–117)
BUN: 20 mg/dL (ref 6–23)
Creatinine, Ser: 0.6 mg/dL (ref 0.40–1.20)
Eosinophils Absolute: 0.4 10*3/uL (ref 0.0–0.7)
Eosinophils Relative: 5 % (ref 0–5)
Glucose, Bld: 107 mg/dL — ABNORMAL HIGH (ref 70–99)
HCT: 44.4 % (ref 36.0–46.0)
HDL: 46 mg/dL (ref >39)
Hemoglobin: 14.9 g/dL (ref 12.0–15.0)
LDL Cholesterol: 224 mg/dL — ABNORMAL HIGH (ref 0–99)
Lymphs Abs: 1.8 10*3/uL (ref 0.7–4.0)
MCHC: 33.6 g/dL (ref 30.0–36.0)
MCV: 94.1 fL (ref 78.0–100.0)
Monocytes Absolute: 0.4 10*3/uL (ref 0.1–1.0)
Monocytes Relative: 5 % (ref 3–12)
Neutrophils Relative %: 67 % (ref 43–77)
Potassium: 5 meq/L (ref 3.5–5.3)
RBC: 4.72 M/uL (ref 3.87–5.11)
Total CHOL/HDL Ratio: 6.7
Triglycerides: 190 mg/dL — ABNORMAL HIGH (ref <150)
WBC: 7.8 10*3/uL (ref 4.0–10.5)

## 2010-08-22 NOTE — Progress Notes (Signed)
Summary: Office Visit//DEPRESSION SCREENING  Office Visit//DEPRESSION SCREENING   Imported By: Arta Bruce 03/17/2010 11:37:04  _____________________________________________________________________  External Attachment:    Type:   Image     Comment:   External Document

## 2010-08-22 NOTE — Letter (Signed)
Summary: surgical pathology report  surgical pathology report   Imported By: Arta Bruce 09/07/2009 12:40:30  _____________________________________________________________________  External Attachment:    Type:   Image     Comment:   External Document

## 2010-08-22 NOTE — Letter (Signed)
Summary: Handout Printed  Printed Handout:  - Depression-Brief 

## 2010-08-22 NOTE — Miscellaneous (Signed)
Summary: Med changes  Full discharge note from [facility] to be scanned in EMR  Medications Added PROPRANOLOL HCL 20 MG  TABS (PROPRANOLOL HCL) One tablet by mouth three times a day FISH OIL 1000 MG CAPS (OMEGA-3 FATTY ACIDS) One capsule by mouth two times a day TYLENOL 325 MG TABS (ACETAMINOPHEN) 1-2 tablets by mouth every 6 hours as needed for pain       Clinical Lists Changes  Problems: Changed problem from HYPERTENSION, BENIGN ESSENTIAL (ICD-401.1) to HYPERTENSION, BENIGN ESSENTIAL (ICD-401.1) - Echo done 10/20/2009 - EF 55% during hospitalization for chest pain Medications: Changed medication from PROPRANOLOL HCL 20 MG  TABS (PROPRANOLOL HCL) 1 tablet by mouth twice daily to PROPRANOLOL HCL 20 MG  TABS (PROPRANOLOL HCL) One tablet by mouth three times a day Added new medication of FISH OIL 1000 MG CAPS (OMEGA-3 FATTY ACIDS) One capsule by mouth two times a day Added new medication of TYLENOL 325 MG TABS (ACETAMINOPHEN) 1-2 tablets by mouth every 6 hours as needed for pain

## 2010-08-22 NOTE — Letter (Signed)
Summary: [facility]/GENERAL SURGERY  [facility]/GENERAL [facility]   Imported By: Arta Bruce 12/14/2009 15:34:46  _____________________________________________________________________  External Attachment:    Type:   Image     Comment:   External Document

## 2010-08-22 NOTE — Letter (Signed)
Summary: [facility] /GENERAL [facility]GERY  [facility] /GENERAL [facility]   Imported By: Arta Bruce 12/02/2009 14:16:43  _____________________________________________________________________  External Attachment:    Type:   Image     Comment:   External Document

## 2010-08-22 NOTE — Letter (Signed)
Summary: [facility]//KIDNEY [facility]  [facility]//K[facility]NEY [facility]   Imported By: Arta Bruce 08/04/2009 14:46:33  _____________________________________________________________________  External Attachment:    Type:   Image     Comment:   External Document

## 2010-08-22 NOTE — Letter (Signed)
Summary: [facility]//COMPLAINT/[facility]ON[facility]  [facility]//COMPLAINT/RENAL STONES   Imported By: Arta Bruce 11/04/2009 10:01:03  _____________________________________________________________________  External Attachment:    Type:   Image     Comment:   External Document

## 2010-08-22 NOTE — Letter (Signed)
Summary: External OtherINTACT PTH  [facility]TACT PTH   Imported By: Arta Bruce 08/04/2009 14:54:51  _____________________________________________________________________  External Attachment:    Type:   Image     Comment:   External Document

## 2010-08-22 NOTE — Progress Notes (Signed)
Summary: Thyroid Surgery             Phone Note Call from Patient Call back at Home Phone [phone]   Summary of Call: This past Tuesday the pt went to [facility] to do different test including (CT Scan test, and she thinks that they have done MRI) and according to her she needs to do a thyroid surgery this coming Monday (Feb 7).  The reason why she is having the thyroid surgery is because   the tyroid are segregating a lot of calcium and as result they need to extract both glands because she had kidney stone.  If you have any further question, please call back to Duanne Guess MD ([facility] [phone]) fax [phone].  [facility] FNP   Initial call taken by: Manon Hilding,  August 26, 2009 4:45 PM  Follow-up for Phone Call        This note is just an FYI. will contact pt to see how she is doing. Continue to f/u at [facility] and schedule to see Zena Amos once she is cleared from [facility] ( this was also discussed with pts husband at last visit) Follow-up by: Michelle Nasuti,  September 08, 2009 12:13 PM

## 2010-08-22 NOTE — Letter (Signed)
Summary: OUTPATIENT CHEMISTRY RESULT  OUTPATIENT CHEMISTRY RESULT   Imported By: Arta Bruce 11/16/2009 15:01:23  _____________________________________________________________________  External Attachment:    Type:   Image     Comment:   External Document

## 2010-08-22 NOTE — Letter (Signed)
Summary: Lipid Letter  [facility]  [address] [place], [place] [address]   Phone: [phone]  Fax: [phone]    10/21/2009  Dayjah Selman [address] [place], [place]  [address]  Dear Ms. Krasner:  We have carefully reviewed your last lipid profile from 10/12/2009 and the results are noted below with a summary of recommendations for lipid management.    Cholesterol:       296     Goal: less than 200   HDL "good" Cholesterol:   57     Goal: greater than 40   LDL "bad" Cholesterol:   194     Goal: less than 100   Triglycerides:       226     Goal: less than 150    This office has been trying to reach you about your cholesterol.  You need to add crestor back into your medication regimen.  The zetia is not enough.  I am aware that you have been to [facility] for chest pain.  Inform this office when you get out of the hospital of any changes in medications.  Call the office to get more information about what you should do to lower your cholesterol.    Current Medications: 1)    Amitriptyline Hcl 50 Mg  Tabs (Amitriptyline hcl) .... 2 tablets by mouth at night for headache 2)    Crestor 20 Mg  Tabs (Rosuvastatin calcium) .Marland Kitchen.. 1 tablet by mouth at night for cholesterol 3)    Zetia 10 Mg  Tabs (Ezetimibe) .Marland Kitchen.. 1 tablet by mouth once daily 4)    Lisinopril-hydrochlorothiazide 20-25 Mg  Tabs (Lisinopril-hydrochlorothiazide) .Marland Kitchen.. 1 tablet by mouth daily 5)    Protonix 40 Mg Tbec (Pantoprazole sodium) .... One tablet by mouth daily 6)    Propranolol Hcl 20 Mg  Tabs (Propranolol hcl) .Marland Kitchen.. 1 tablet by mouth twice daily 7)    Aspirin Ec 81 Mg  Tbec (Aspirin) .Marland Kitchen.. 1 tablet by mouth daily for circulation 8)    Calcium Antacid Extra Strength 750 Mg Chew (Calcium carbonate antacid) .... 2 tablets by mouth three times a day  If you have any questions, please call. We appreciate being able to work with you.   Sincerely,    [facility] Lehman Prom  FNP

## 2010-08-22 NOTE — Letter (Signed)
Summary: Discharge Summary  Discharge Summary   Imported By: Arta Bruce 11/09/2009 09:53:41  _____________________________________________________________________  External Attachment:    Type:   Image     Comment:   External Document

## 2010-08-22 NOTE — Progress Notes (Signed)
Summary: Pt is under a lot of depression   Phone Note Call from Patient Call back at [phone]   Summary of Call: On Friday, the pt lost her child in a car accident and the pt is under a lot of depression.  The pt is constantly crying and she out from three different medications including amitriptyline, crestor and zetia. [facility] FNP  Initial call taken by: Manon Hilding,  February 10, 2010 9:49 AM  Follow-up for Phone Call        Pt.'s son age 64 was killed in a car accident yesterday. Pt. requesting refills of Amitriptyline, Crestor and Zetia, states she has no way to pay for medications. Per med record pt. has refills available. Contacted [facility] [address] they do have an active script for Amitriptyline($10.53) none for Crestor or Zetia. Check with [facility] no active scripts there. Spoke with husband, he said no problem they can get the Amitriptyline. He does not know where she has gotten Crestor or Zetia however really only wanted the Amitriptyline @ this time. Offered to have her come in today to see our Child psychotherapist. Husband will talk with her and call back if she wants to do that. Jesse Fall FNP aware. Follow-up by: Gaylyn Cheers RN,  February 10, 2010 10:45 AM     Appended Document: Crestor Samples Notified Husband if someone wants to come by the office we will give her samples of Crestor. Gaylyn Cheers RN  February 10, 2010 11:35 AM  Husband came by office 4 pkgs of 7 tablets Crestor 20 mg given. per Jesse Fall FNP    Clinical Lists Changes

## 2010-08-22 NOTE — Letter (Signed)
Summary: Discharge Summary//[facility]  Discharge Summary//[facility]   Imported By: Arta Bruce 12/30/2009 14:40:37  _____________________________________________________________________  External Attachment:    Type:   Image     Comment:   External Document

## 2010-08-22 NOTE — Assessment & Plan Note (Signed)
Summary: Depression   Vital Signs:  Patient profile:   65 year old female Menstrual status:  postmenopausal Height:      62.25 inches Weight:      214 pounds BMI:     38.97 Temp:     98.6 degrees F oral Pulse rate:   88 / minute Pulse rhythm:   regular Resp:     18 per minute BP sitting:   140 / 82  (left arm) Cuff size:   large  Vitals Entered By: [name] (April 19, 2010 3:21 PM) CC: f/u.... med review.. Is Patient Diabetic? No Pain Assessment Patient in pain? no       Does patient need assistance? Functional Status Self care Ambulation Normal   Primary Care Provider:  Lehman Prom FNP  CC:  f/u.... med review...  History of Present Illness: Here for f/u on depression. Daughter recenlty died. Jesse Fall started on Sertraline.  Patient only taking as needed.  Husband to interpret.  States he misunderstood the directions.  Has taken xanax as needed. Seems to help when she takes it.  Having crying spells.  No suicidal thoughts.  Gets chest pain when she gets upset.  Open to going to counseling. Sleeping ok.  Current Medications (verified): 1)  Amitriptyline Hcl 50 Mg  Tabs (Amitriptyline Hcl) .... 2 Tablets By Mouth At Night For Headache 2)  Crestor 20 Mg  Tabs (Rosuvastatin Calcium) .Marland Kitchen.. 1 Tablet By Mouth At Night For Cholesterol 3)  Zetia 10 Mg  Tabs (Ezetimibe) .Marland Kitchen.. 1 Tablet By Mouth Once Daily 4)  Lisinopril-Hydrochlorothiazide 20-25 Mg  Tabs (Lisinopril-Hydrochlorothiazide) .Marland Kitchen.. 1 Tablet By Mouth Daily 5)  Protonix 40 Mg Tbec (Pantoprazole Sodium) .... One Tablet By Mouth Daily 6)  Propranolol Hcl 20 Mg  Tabs (Propranolol Hcl) .... One Tablet By Mouth Three Times A Day 7)  Aspirin Ec 81 Mg  Tbec (Aspirin) .Marland Kitchen.. 1 Tablet By Mouth Daily For Circulation 8)  Calcium Antacid Extra Strength 750 Mg Chew (Calcium Carbonate Antacid) .... 2 Tablets By Mouth Three Times A Day 9)  Fish Oil 1000 Mg Caps (Omega-3 Fatty Acids) .... One Capsule By Mouth Two Times A  Day 10)  Tylenol 325 Mg Tabs (Acetaminophen) .Marland Kitchen.. 1-2 Tablets By Mouth Every 6 Hours As Needed For Pain 11)  Sertraline Hcl 50 Mg Tabs (Sertraline Hcl) .... One Tablet By Mouth Nightly For Mood 12)  Alprazolam 0.5 Mg Tabs (Alprazolam) .... One Tablet By Mouth Daily As Needed For Nerves  Allergies (verified): 1)  ! Morphine  Physical Exam  General:  alert, well-developed, and well-nourished.   Head:  normocephalic and atraumatic.   Neck:  supple.   Lungs:  normal breath sounds.   Heart:  normal rate and regular rhythm.   Neurologic:  alert & oriented X3 and cranial nerves II-XII intact.   Psych:  tearful.     Impression & Recommendations:  Problem # 1:  DEPRESSION (ICD-311)  not really taking sertraline correctly discussed this with husband refer to Ethelene Browns f/u with PCP in 6 weeks  Her updated medication list for this problem includes:    Amitriptyline Hcl 50 Mg Tabs (Amitriptyline hcl) .Marland Kitchen... 2 tablets by mouth at night for headache    Sertraline Hcl 50 Mg Tabs (Sertraline hcl) ..... One tablet by mouth nightly for mood    Alprazolam 0.5 Mg Tabs (Alprazolam) ..... One tablet by mouth daily as needed for nerves  Orders: Psychology Referral (Psychology)  Problem # 2:  HYPERTENSION, BENIGN ESSENTIAL (  ICD-401.1) Assessment: Improved  Her updated medication list for this problem includes:    Lisinopril-hydrochlorothiazide 20-25 Mg Tabs (Lisinopril-hydrochlorothiazide) .Marland Kitchen... 1 tablet by mouth daily    Propranolol Hcl 20 Mg Tabs (Propranolol hcl) ..... One tablet by mouth three times a day  Complete Medication List: 1)  Amitriptyline Hcl 50 Mg Tabs (Amitriptyline hcl) .... 2 tablets by mouth at night for headache 2)  Crestor 20 Mg Tabs (Rosuvastatin calcium) .Marland Kitchen.. 1 tablet by mouth at night for cholesterol 3)  Zetia 10 Mg Tabs (Ezetimibe) .Marland Kitchen.. 1 tablet by mouth once daily 4)  Lisinopril-hydrochlorothiazide 20-25 Mg Tabs (Lisinopril-hydrochlorothiazide) .Marland Kitchen.. 1 tablet by  mouth daily 5)  Protonix 40 Mg Tbec (Pantoprazole sodium) .... One tablet by mouth daily 6)  Propranolol Hcl 20 Mg Tabs (Propranolol hcl) .... One tablet by mouth three times a day 7)  Aspirin Ec 81 Mg Tbec (Aspirin) .Marland Kitchen.. 1 tablet by mouth daily for circulation 8)  Calcium Antacid Extra Strength 750 Mg Chew (Calcium carbonate antacid) .... 2 tablets by mouth three times a day 9)  Fish Oil 1000 Mg Caps (Omega-3 fatty acids) .... One capsule by mouth two times a day 10)  Tylenol 325 Mg Tabs (Acetaminophen) .Marland Kitchen.. 1-2 tablets by mouth every 6 hours as needed for pain 11)  Sertraline Hcl 50 Mg Tabs (Sertraline hcl) .... One tablet by mouth nightly for mood 12)  Alprazolam 0.5 Mg Tabs (Alprazolam) .... One tablet by mouth daily as needed for nerves  Patient Instructions: 1)  Take Sertraline 50 mg at bedtime.  Take this medicine every night.  It will take time to make you feel better. 2)  Schedule an appt with Ethelene Browns.  Interpreter will be needed. 3)  Schedule follow up with Lehman Prom, NP in 6 weeks for depression.

## 2010-08-22 NOTE — Progress Notes (Signed)
Summary: NEED REFILL amitriptyline  Phone Note Refill Request Call back at [phone]   Refills Requested: Medication #1:  AMITRIPTYLINE HCL 50 MG  TABS 2 tablets by mouth at night for headache  NEED REFILL PHARMACY [facility] [address]  Initial call taken by: Domenic Polite,  June 01, 2010 11:20 AM    Prescriptions: AMITRIPTYLINE HCL 50 MG  TABS (AMITRIPTYLINE HCL) 2 tablets by mouth at night for headache  #60 x 2   Entered by:   Dutch Quint RN   Authorized by:   Lehman Prom FNP   Signed by:   Dutch Quint RN on 06/01/2010   Method used:   Electronically to        [facility] [phone]* (retail)       [address]       [place], [place]  [address]       Ph: [phone]       Fax: [phone]   RxID:   [id number]

## 2010-08-22 NOTE — Letter (Signed)
Summary: Lipid Letter  [facility]  [address] [place], [place] [address]   Phone: [phone]  Fax: [phone]    10/13/2009  Kendra [name] [address] [place], [place]  [address]  Dear Ms. Ternes:  We have carefully reviewed your last lipid profile from 10/12/2009 and the results are noted below with a summary of recommendations for lipid management.    Cholesterol:       296     Goal: less than 200   HDL "good" Cholesterol:   57     Goal: greater than 40   LDL "bad" Cholesterol:   194     Goal: less than 100   Triglycerides:       226     Goal: less than 150    Your cholesterol is still VERY high.  You should have been contacted by this office. You should continue to take BOTH crestor and zetia.       Current Medications: 1)    Amitriptyline Hcl 50 Mg  Tabs (Amitriptyline hcl) .... 2 tablets by mouth at night for headache 2)    Crestor 20 Mg  Tabs (Rosuvastatin calcium) .Marland Kitchen.. 1 tablet by mouth at night for cholesterol 3)    Zetia 10 Mg  Tabs (Ezetimibe) .Marland Kitchen.. 1 tablet by mouth once daily 4)    Lisinopril-hydrochlorothiazide 20-25 Mg  Tabs (Lisinopril-hydrochlorothiazide) .Marland Kitchen.. 1 tablet by mouth daily 5)    Protonix 40 Mg Tbec (Pantoprazole sodium) .... One tablet by mouth daily 6)    Propranolol Hcl 20 Mg  Tabs (Propranolol hcl) .Marland Kitchen.. 1 tablet by mouth twice daily 7)    Aspirin Ec 81 Mg  Tbec (Aspirin) .Marland Kitchen.. 1 tablet by mouth daily for circulation 8)    Calcium Antacid Extra Strength 750 Mg Chew (Calcium carbonate antacid) .... 2 tablets by mouth three times a day  If you have any questions, please call. We appreciate being able to work with you.   Sincerely,    [facility] Lehman Prom FNP

## 2010-08-22 NOTE — Letter (Signed)
Summary: [facility]GENERAL SURGERY  [facility]GENERAL [facility]   Imported By: Arta Bruce 12/02/2009 10:07:16  _____________________________________________________________________  External Attachment:    Type:   Image     Comment:   External Document

## 2010-08-22 NOTE — Assessment & Plan Note (Signed)
Summary: Depression   Vital Signs:  Patient profile:   65 year old female Menstrual status:  postmenopausal Weight:      213.2 pounds Pulse rate:   80 / minute Pulse rhythm:   regular Resp:     20 per minute BP sitting:   150 / 110  (left arm) Cuff size:   large  Vitals Entered By: Levon Hedger (March 17, 2010 10:01 AM) CC: nervousness x 2 months, Depression, Hypertension Management, Lipid Management, Headache Is Patient Diabetic? No Pain Assessment Patient in pain? yes     Location: head Intensity: 6-7  Does patient need assistance? Functional Status Self care Ambulation Normal     Menstrual Status postmenopausal   CC:  nervousness x 2 months, Depression, Hypertension Management, Lipid Management, and Headache.  History of Present Illness:  Pt into the office for f/u on mood. Pt recently experienced the death of her son.  Husband present today with pt who is her interpreter  Depression History:      The patient comes in today for her first follow up visit for depression.  The patient is having a depressed mood most of the day and has a diminished interest in her usual daily activities.  Positive alarm features for depression include impaired concentration (indecisiveness).  However, she denies recurrent thoughts of death or suicide.        Psychosocial stress factors include the recent death of a loved one.  The patient denies that she feels like life is not worth living, denies that she wishes that she were dead, and denies that she has thought about ending her life.        Comments:  Sudden loss of Son on last month in a car accident.  Pt does not want to drive anymore.  Depression Treatment History:  Prior Medication Used:   Start Date: Assessment of Effect:   Comments:  Zoloft (sertraline)     03/17/2010     --       improved  Headache HPI:      The headaches are not associated with an aura.  The location of the headaches are unilateral-right.  Headache  quality is throbbing or pulsating.  The headaches are associated with nausea.         Headache Treatment History:      She has tried beta blockers and tricyclic antidepressants which were effective.    Hypertension History:      She denies headache, chest pain, and palpitations.  She notes no problems with any antihypertensive medication side effects.        Positive major cardiovascular risk factors include female age 30 years old or older, hyperlipidemia, hypertension, and family history for ischemic heart disease (females less than 23 years old).  Negative major cardiovascular risk factors include no history of diabetes and non-tobacco-user status.        Further assessment for target organ damage reveals no history of ASHD, cardiac end-organ damage (CHF/LVH), stroke/TIA, peripheral vascular disease, renal insufficiency, or hypertensive retinopathy.    Lipid Management History:      Positive NCEP/ATP III risk factors include female age 50 years old or older, family history for ischemic heart disease (females less than 79 years old), and hypertension.  Negative NCEP/ATP III risk factors include non-diabetic, non-tobacco-user status, no ASHD (atherosclerotic heart disease), no prior stroke/TIA, no peripheral vascular disease, and no history of aortic aneurysm.        The patient states that she knows about the "  Therapeutic Lifestyle Change" diet.  Her compliance with the TLC diet is fair.  The patient does not know about adjunctive measures for cholesterol lowering.  She expresses no side effects from her lipid-lowering medication.  Comments include: Pt is taking crestor 20mg  samples.  The patient denies any symptoms to suggest myopathy or liver disease.            Habits & Providers  Alcohol-Tobacco-Diet     Alcohol drinks/day: 0     Tobacco Status: never  Exercise-Depression-Behavior     Does Patient Exercise: no     Have you felt down or hopeless? yes     Have you felt little pleasure  in things? yes     Depression Counseling: further diagnostic testing and/or other treatment is indicated     Drug Use: no     Sun Exposure: occasionally  Comments: PHQ-9 score = 9  Current Medications (verified): 1)  Amitriptyline Hcl 50 Mg  Tabs (Amitriptyline Hcl) .... 2 Tablets By Mouth At Night For Headache 2)  Crestor 20 Mg  Tabs (Rosuvastatin Calcium) .Marland Kitchen.. 1 Tablet By Mouth At Night For Cholesterol 3)  Zetia 10 Mg  Tabs (Ezetimibe) .Marland Kitchen.. 1 Tablet By Mouth Once Daily 4)  Lisinopril-Hydrochlorothiazide 20-25 Mg  Tabs (Lisinopril-Hydrochlorothiazide) .Marland Kitchen.. 1 Tablet By Mouth Daily 5)  Protonix 40 Mg Tbec (Pantoprazole Sodium) .... One Tablet By Mouth Daily 6)  Propranolol Hcl 20 Mg  Tabs (Propranolol Hcl) .... One Tablet By Mouth Three Times A Day 7)  Aspirin Ec 81 Mg  Tbec (Aspirin) .Marland Kitchen.. 1 Tablet By Mouth Daily For Circulation 8)  Calcium Antacid Extra Strength 750 Mg Chew (Calcium Carbonate Antacid) .... 2 Tablets By Mouth Three Times A Day 9)  Fish Oil 1000 Mg Caps (Omega-3 Fatty Acids) .... One Capsule By Mouth Two Times A Day 10)  Tylenol 325 Mg Tabs (Acetaminophen) .Marland Kitchen.. 1-2 Tablets By Mouth Every 6 Hours As Needed For Pain  Allergies (verified): 1)  ! Morphine  Review of Systems CV:  Denies chest pain or discomfort. Resp:  Denies cough. GI:  Denies abdominal pain, nausea, and vomiting. Neuro:  Complains of headaches. Psych:  Complains of anxiety and depression.  Physical Exam  General:  alert.   Head:  normocephalic.   Msk:  up to the exam table Neurologic:  alert & oriented X3.   Psych:  crying during the exam   Impression & Recommendations:  Problem # 1:  DEPRESSION (ICD-311) will start sertraline  handout given recent loss of son has triggered pt Her updated medication list for this problem includes:    Amitriptyline Hcl 50 Mg Tabs (Amitriptyline hcl) .Marland Kitchen... 2 tablets by mouth at night for headache    Sertraline Hcl 50 Mg Tabs (Sertraline hcl) ..... One tablet  by mouth nightly for mood    Alprazolam 0.5 Mg Tabs (Alprazolam) ..... One tablet by mouth daily as needed for nerves  Problem # 2:  HYPERTENSION, BENIGN ESSENTIAL (ICD-401.1) BP elevated today but pt has not taken her medications yet advised her to take daily as ordered Her updated medication list for this problem includes:    Lisinopril-hydrochlorothiazide 20-25 Mg Tabs (Lisinopril-hydrochlorothiazide) .Marland Kitchen... 1 tablet by mouth daily    Propranolol Hcl 20 Mg Tabs (Propranolol hcl) ..... One tablet by mouth three times a day  Problem # 3:  DYSLIPIDEMIA (ICD-272.4) will give crestor samples Her updated medication list for this problem includes:    Crestor 20 Mg Tabs (Rosuvastatin calcium) .Marland Kitchen... 1 tablet by mouth  at night for cholesterol    Zetia 10 Mg Tabs (Ezetimibe) .Marland Kitchen... 1 tablet by mouth once daily  Problem # 4:  GERD (ICD-530.81)  Her updated medication list for this problem includes:    Protonix 40 Mg Tbec (Pantoprazole sodium) ..... One tablet by mouth daily    Calcium Antacid Extra Strength 750 Mg Chew (Calcium carbonate antacid) .Marland Kitchen... 2 tablets by mouth three times a day  Complete Medication List: 1)  Amitriptyline Hcl 50 Mg Tabs (Amitriptyline hcl) .... 2 tablets by mouth at night for headache 2)  Crestor 20 Mg Tabs (Rosuvastatin calcium) .Marland Kitchen.. 1 tablet by mouth at night for cholesterol 3)  Zetia 10 Mg Tabs (Ezetimibe) .Marland Kitchen.. 1 tablet by mouth once daily 4)  Lisinopril-hydrochlorothiazide 20-25 Mg Tabs (Lisinopril-hydrochlorothiazide) .Marland Kitchen.. 1 tablet by mouth daily 5)  Protonix 40 Mg Tbec (Pantoprazole sodium) .... One tablet by mouth daily 6)  Propranolol Hcl 20 Mg Tabs (Propranolol hcl) .... One tablet by mouth three times a day 7)  Aspirin Ec 81 Mg Tbec (Aspirin) .Marland Kitchen.. 1 tablet by mouth daily for circulation 8)  Calcium Antacid Extra Strength 750 Mg Chew (Calcium carbonate antacid) .... 2 tablets by mouth three times a day 9)  Fish Oil 1000 Mg Caps (Omega-3 fatty acids) .... One  capsule by mouth two times a day 10)  Tylenol 325 Mg Tabs (Acetaminophen) .Marland Kitchen.. 1-2 tablets by mouth every 6 hours as needed for pain 11)  Sertraline Hcl 50 Mg Tabs (Sertraline hcl) .... One tablet by mouth nightly for mood 12)  Alprazolam 0.5 Mg Tabs (Alprazolam) .... One tablet by mouth daily as needed for nerves  Hypertension Assessment/Plan:      The patient's hypertensive risk group is category B: At least one risk factor (excluding diabetes) with no target organ damage.  Her calculated 10 year risk of coronary heart disease is 20 %.  Today's blood pressure is 150/110.  Her blood pressure goal is < 140/90.  Lipid Assessment/Plan:      Based on NCEP/ATP III, the patient's risk factor category is "2 or more risk factors and a calculated 10 year CAD risk of < 20%".  The patient's lipid goals are as follows: Total cholesterol goal is 200; LDL cholesterol goal is 130; HDL cholesterol goal is 40; Triglyceride goal is 150.    Patient Instructions: 1)  Read handnout on depression 2)  Start sertraline 50mg  1/2 tablet by mouth nightly for 1 week then increase to a full tablet by mouth nightly. 3)  May take alporazolam as needed for increased anxiety and crying spells during the day.  4)  It will take the sertraline some time (about 4-6 weeks) to build up the system so take it nightly. 5)  Follow up in 4 weeks for mood. 6)  Will review medications at that time Prescriptions: ALPRAZOLAM 0.5 MG TABS (ALPRAZOLAM) One tablet by mouth daily as needed for nerves  #20 x 0   Entered and Authorized by:   Lehman Prom FNP   Signed by:   Lehman Prom FNP on 03/17/2010   Method used:   Print then Give to Patient   RxID:   [id number] SERTRALINE HCL 50 MG TABS (SERTRALINE HCL) One tablet by mouth nightly for mood  #30 x 1   Entered and Authorized by:   Lehman Prom FNP   Signed by:   Lehman Prom FNP on 03/17/2010   Method used:   Print then Give to Patient   RxID:    [id number]  Appended Document: Depression     Clinical Lists Changes  Orders: Added new Service order of Est. Patient Level III ([address]) - Signed

## 2010-08-22 NOTE — Letter (Signed)
Summary: [place] [facility]OUTPATIENT CHE[facility]STRY RESULTS  [place] [facility]OUTPATIENT CHEMISTRY RESULTS   Imported By: Arta Bruce 09/20/2009 14:54:18  _____________________________________________________________________  External Attachment:    Type:   Image     Comment:   External Document

## 2010-08-22 NOTE — Progress Notes (Signed)
Summary: Please f/u with patient   Phone Note Call from Patient Call back at [facility] Phone [phone]   Summary of Call: The pt needs more refills from crestor medication.   Pt states that the provider send a prescription to [facility]) but the medication cost 80 dollars and she doesn't remember the name of the medication. [facility] FNP Initial call taken by: Manon Hilding,  October 31, 2009 11:47 AM  Follow-up for Phone Call        Ashby Dawes, Please call patient to clarify this message. I understand she needs a refill on crestor not sure what the second half of the message means. Is she talking about the crestor or a different medication. Unless she tells [place] the name we dont know what medication cost $80.00 Follow-up by: Mikey College CMA,  November 01, 2009 11:19 AM  Additional Follow-up for Phone Call Additional follow up Details #1::        Left a message to the pt to call back.Manon Hilding  November 03, 2009 1:48 PM  The pt cannot get the refills from the [facility] because she doesn't  has the plan D from medicare, so as result she needs to have the plan D to get the medication from [facility].  It seems that needs refills from crestor, so when she went to [facility], the pharmacist told her that there was a medication that cost $80 but she didn't asked which one was.  Pt is out of crestor and amitriptyline. Manon Hilding  November 09, 2009 8:21 AM Daphine Deutscher FNP    Additional Follow-up for Phone Call Additional follow up Details #2::    Regardless of having a plan D or not she can not use [facility] with medicare, will check to see if there is something on [facility] $4 plan for cholesterol with [employer]. Follow-up by: Vesta Mixer CMA,  November 10, 2009 9:42 AM  Additional Follow-up for Phone Call Additional follow up Details #3:: Details for Additional Follow-up Action Taken: pt really need crestor for cholesterol - I have a savings card for here and  she can pick up along with a new Rx so she can get a discount from [facility] In basket for pt to pick up  Levon Hedger  November 10, 2009 12:52 PM Left message on machine for pt to return call to the office Levon Hedger  November 14, 2009 3:12 PM Left message on machine for pt to return call to the office.  Left message on answering machine for pt to call back...will mail letter.Marland KitchenMarland KitchenArmenia Shannon  November 15, 2009 2:38 PM  Additional Follow-up by: Lehman Prom FNP,  November 10, 2009 9:50 AM  Prescriptions: AMITRIPTYLINE HCL 50 MG  TABS (AMITRIPTYLINE HCL) 2 tablets by mouth at night for headache  #60 x 5   Entered and Authorized by:   Lehman Prom FNP   Signed by:   Lehman Prom FNP on 11/10/2009   Method used:   Print then Give to Patient   RxID:   [id number] CRESTOR 20 MG  TABS (ROSUVASTATIN CALCIUM) 1 tablet by mouth at night for cholesterol  #30 x 5   Entered and Authorized by:   Lehman Prom FNP   Signed by:   Lehman Prom FNP on 11/10/2009   Method used:   Print then Give to Patient   RxID:   [id number]

## 2010-08-22 NOTE — Assessment & Plan Note (Signed)
Summary: S/p Cholectystectomy   Vital Signs:  Patient profile:   65 year old female Menstrual status:  postmenopausal Weight:      206.3 pounds BMI:     37.57 Temp:     98.1 degrees F oral Pulse rate:   80 / minute Pulse rhythm:   regular Resp:     20 per minute BP sitting:   128 / 94  (left arm) Cuff size:   large  Vitals Entered By: Levon Hedger (June 02, 2010 11:14 AM)  Nutrition Counseling: Patient's BMI is greater than 25 and therefore counseled on weight management options. CC: everytime she eats  she has pain in her stomach...states she  had her gallbladder removed and she is still having pain with alot of diarrhea...pain in right breast, Hypertension Management, Lipid Management, Depression Is Patient Diabetic? No Pain Assessment Patient in pain? no       Does patient need assistance? Functional Status Self care Ambulation Normal   Primary Care Provider:  Lehman Prom FNP  CC:  everytime she eats  she has pain in her stomach...states she  had her gallbladder removed and she is still having pain with alot of diarrhea...pain in right breast, Hypertension Management, Lipid Management, and Depression.  History of Present Illness:  Pt into the office for f/u s/p cholecystectomy. Operation done 04/25/2010 at [facility] Her complaints at this is diarrhea.  Pt reports that she was told in 2 weeks she would be back to normal. Diet - she is eating salad, chicken, No red meat  [employer] used for interpreter  Depression - still grieving the loss of her son who died suddenly  Depression History:      The patient comes in today for her second follow up visit for depression.  The patient is having a depressed mood most of the day and has a diminished interest in her usual daily activities.  Positive alarm features for depression include insomnia and fatigue (loss of energy).  However, she denies recurrent thoughts of death or suicide.        Psychosocial  stress factors include the recent death of a loved one.  The patient denies that she feels like life is not worth living, denies that she wishes that she were dead, and denies that she has thought about ending her life.         Depression Treatment History:  Prior Medication Used:   Start Date: Assessment of Effect:   Comments:  Zoloft (sertraline)     03/17/2010   some improvement     improved  Hypertension History:      She denies headache, chest pain, and palpitations.  She notes no problems with any antihypertensive medication side effects.        Positive major cardiovascular risk factors include female age 47 years old or older, hyperlipidemia, hypertension, and family history for ischemic heart disease (females less than 19 years old).  Negative major cardiovascular risk factors include no history of diabetes and non-tobacco-user status.        Further assessment for target organ damage reveals no history of ASHD, cardiac end-organ damage (CHF/LVH), stroke/TIA, peripheral vascular disease, renal insufficiency, or hypertensive retinopathy.    Lipid Management History:      Positive NCEP/ATP III risk factors include female age 69 years old or older, family history for ischemic heart disease (females less than 14 years old), and hypertension.  Negative NCEP/ATP III risk factors include non-diabetic, non-tobacco-user status, no ASHD (atherosclerotic heart  disease), no prior stroke/TIA, no peripheral vascular disease, and no history of aortic aneurysm.        The patient states that she knows about the "Therapeutic Lifestyle Change" diet.  Her compliance with the TLC diet is fair.  Comments include: no current medications - meds stopped while in hospital.       Allergies (verified): 1)  ! Morphine  Past History:  Past Surgical History: Hysterectomy Cholecystectomy 04/25/2010  Review of Systems CV:  Denies chest pain or discomfort. Resp:  Denies cough. GI:  Complains of diarrhea. MS:   Complains of low back pain. Psych:  Complains of depression; pt missed her appt with amanda vaughn.  Physical Exam  General:  alert.   Head:  normocephalic.   Eyes:  glasses Breasts:  no masses.   Lungs:  normal breath sounds.   Heart:  normal rate and regular rhythm.   Msk:  normal ROM.   Neurologic:  alert & oriented X3.   Skin:  color normal.   Psych:  Oriented X3.     Impression & Recommendations:  Problem # 1:  CHOLECYSTECTOMY, HX OF (ICD-V45.79) Reviewed with pt that she needs a low fat diet to decrease the diarrhea  Problem # 2:  UNSPECIFIED BREAST SCREENING (ICD-V76.10) will order mammogram Orders: Mammogram (Screening) (Mammo)  Problem # 3:  HYPERTENSION, BENIGN ESSENTIAL (ICD-401.1) some meds d/c'd when pt was in the hospital The following medications were removed from the medication list:    Lisinopril-hydrochlorothiazide 20-25 Mg Tabs (Lisinopril-hydrochlorothiazide) .Marland Kitchen... 1 tablet by mouth daily Her updated medication list for this problem includes:    Propranolol Hcl 20 Mg Tabs (Propranolol hcl) ..... One tablet by mouth three times a day  Problem # 4:  NEED PROPHYLACTIC VACCINATION&INOCULATION FLU (ICD-V04.81) pt had while in the hospital  Complete Medication List: 1)  Amitriptyline Hcl 50 Mg Tabs (Amitriptyline hcl) .... 2 tablets by mouth at night for headache 2)  Propranolol Hcl 20 Mg Tabs (Propranolol hcl) .... One tablet by mouth three times a day 3)  Aspirin Ec 81 Mg Tbec (Aspirin) .Marland Kitchen.. 1 tablet by mouth daily for circulation 4)  Calcium Antacid Extra Strength 750 Mg Chew (Calcium carbonate antacid) .... 2 tablets by mouth three times a day 5)  Fish Oil 1000 Mg Caps (Omega-3 fatty acids) .... One capsule by mouth two times a day 6)  Tylenol 325 Mg Tabs (Acetaminophen) .Marland Kitchen.. 1-2 tablets by mouth every 6 hours as needed for pain 7)  Sertraline Hcl 50 Mg Tabs (Sertraline hcl) .... One tablet by mouth nightly for mood 8)  Alprazolam 0.5 Mg Tabs  (Alprazolam) .... One tablet by mouth daily as needed for nerves  Hypertension Assessment/Plan:      The patient's hypertensive risk group is category B: At least one risk factor (excluding diabetes) with no target organ damage.  Her calculated 10 year risk of coronary heart disease is 17 %.  Today's blood pressure is 128/94.  Her blood pressure goal is < 140/90.  Lipid Assessment/Plan:      Based on NCEP/ATP III, the patient's risk factor category is "2 or more risk factors and a calculated 10 year CAD risk of < 20%".  The patient's lipid goals are as follows: Total cholesterol goal is 200; LDL cholesterol goal is 130; HDL cholesterol goal is 40; Triglyceride goal is 150.    Patient Instructions: 1)  Schedule an appointment with Aquilla Solian 2)  Restart your medications for mood 3)  Gallbladder removal - You  may still have some diarrhea so be sure to eat low fat foods. Also avoid fried foods. 4)  Keep your appointment for mammogram 5)  Follow up in 6 weeks or sooner if necessary. Prescriptions: ALPRAZOLAM 0.5 MG TABS (ALPRAZOLAM) One tablet by mouth daily as needed for nerves  #20 x 0   Entered and Authorized by:   Lehman Prom FNP   Signed by:   Lehman Prom FNP on 06/02/2010   Method used:   Print then Give to Patient   RxID:   [id number] SERTRALINE HCL 50 MG TABS (SERTRALINE HCL) One tablet by mouth nightly for mood  #30 x 3   Entered and Authorized by:   Lehman Prom FNP   Signed by:   Lehman Prom FNP on 06/02/2010   Method used:   Print then Give to Patient   RxID:   [id number] PROPRANOLOL HCL 20 MG  TABS (PROPRANOLOL HCL) One tablet by mouth three times a day  #120 x 5   Entered and Authorized by:   Lehman Prom FNP   Signed by:   Lehman Prom FNP on 06/02/2010   Method used:   Print then Give to Patient   RxID:   [id number]    Orders Added: 1)  Est. Patient Level III [01027] 2)  Mammogram (Screening) [Mammo]

## 2010-08-22 NOTE — Letter (Signed)
Summary: *HSN Results Follow up  [facility]  [address] [place], [place] [address]   Phone: [phone]  Fax: [phone]      11/15/2009   MIIA BLANKS [address] [place], [place]  [address]   Dear  Ms. Kendra [name],                            ____S.Drinkard,FNP   ____D. Gore,FNP       ____B. McPherson,MD   ____V. Rankins,MD    ____E. Mulberry,MD    ____N. Daphine Deutscher, FNP  ____D. Reche Dixon, MD    ____K. Philipp Deputy, MD    ____Other     This letter is to inform you that your recent test(s):  _______Pap Smear    _______Lab Test     _______X-ray    _______ is within acceptable limits  _______ requires a medication change  _______ requires a follow-up lab visit  ___X____ requires a follow-up visit with your provider   Comments:  We have been trying to reach you.  Please give the office a call at your earliest convenience.       _________________________________________________________ If you have any questions, please contact our office                     Sincerely,  [name] [facility]

## 2010-08-22 NOTE — Progress Notes (Signed)
Summary: Office Visit//[facility]  Office Visit//[facility]   Imported By: Arta Bruce 09/07/2009 13:00:00  _____________________________________________________________________  External Attachment:    Type:   Image     Comment:   External Document

## 2010-08-22 NOTE — Letter (Signed)
Summary: [facility]//POST -OP FOLLOW UP  [facility]//POST -OP FOLLOW UP   Imported By: Arta Bruce 09/07/2009 12:51:04  _____________________________________________________________________  External Attachment:    Type:   Image     Comment:   External Document

## 2010-08-22 NOTE — Assessment & Plan Note (Signed)
Summary: Allergic Rhinitis   Vital Signs:  Patient profile:   65 year old female Weight:      215.3 pounds BMI:     39.20 BSA:     1.98 Temp:     98.1 degrees F oral Pulse rate:   79 / minute Pulse rhythm:   regular Resp:     20 per minute BP sitting:   144 / 95  (left arm) Cuff size:   large  Vitals Entered By: Levon Hedger (October 12, 2009 2:36 PM) CC: pt states she needs her cholesterol checked...Marland KitchenMarland Kitchenfeeling nauseated, Hypertension Management, Lipid Management Is Patient Diabetic? No Pain Assessment Patient in pain? no       Does patient need assistance? Functional Status Self care Ambulation Normal Comments pt had coffee this morning   CC:  pt states she needs her cholesterol checked...Marland KitchenMarland Kitchenfeeling nauseated, Hypertension Management, and Lipid Management.  History of Present Illness:  Pt into the office with complaints of right ear pain. No drainage from ear Slight itching +dizziness  Sleep Apnea - pt has a CPAP that she has had for many years, at least 10 years.  She purchased the machine in [place].  The mask and supplies that she has for the machine are getting worn and needs replacement.  Hyperparathyroidism - s/p removal at [facility].   Parathyroid removed. Pt is taking calcium tablets. Likely kidney stones, osteoporosis and cholesterol are side effects of parathyroid  Son present today during the exam who is serving as her interpreter  Hypertension History:      She denies headache, chest pain, and palpitations.  She notes no problems with any antihypertensive medication side effects.        Positive major cardiovascular risk factors include female age 42 years old or older, hyperlipidemia, hypertension, and family history for ischemic heart disease (females less than 49 years old).  Negative major cardiovascular risk factors include non-tobacco-user status.        Further assessment for target organ damage reveals no history of ASHD, cardiac end-organ  damage (CHF/LVH), stroke/TIA, peripheral vascular disease, renal insufficiency, or hypertensive retinopathy.    Lipid Management History:      Positive NCEP/ATP III risk factors include female age 33 years old or older, family history for ischemic heart disease (females less than 56 years old), and hypertension.  Negative NCEP/ATP III risk factors include non-tobacco-user status, no ASHD (atherosclerotic heart disease), no prior stroke/TIA, no peripheral vascular disease, and no history of aortic aneurysm.        The patient states that she knows about the "Therapeutic Lifestyle Change" diet.  Her compliance with the TLC diet is fair.  The patient does not know about adjunctive measures for cholesterol lowering.  She expresses no side effects from her lipid-lowering medication.  Comments include: Pt has stopped taking her zetia under the advisement of provider at [facility].  The patient denies any symptoms to suggest myopathy or liver disease.      Allergies (verified): 1)  ! Morphine  Review of Systems General:  Denies fever. ENT:  Complains of earache; right . CV:  Denies chest pain or discomfort. Resp:  Denies cough. GI:  Denies abdominal pain, nausea, and vomiting.  Physical Exam  General:  alert.   Head:  normocephalic.   Ears:  bil ears with clear fluid - no erythema Mouth:  pharynx pink and moist.   Neck:  healed incision to anterior neck Lungs:  normal breath sounds.   Heart:  normal  rate and regular rhythm.   Abdomen:  normal bowel sounds.   Msk:  up to the exam table Neurologic:  alert & oriented X3.   Skin:  color normal.   Psych:  Oriented X3.     Impression & Recommendations:  Problem # 1:  ALLERGIC RHINITIS (ICD-477.9) pt can take allegra over the counter for allergies  Problem # 2:  HYPERPARATHYROIDISM UNSPECIFIED (ICD-252.00) pt is s/p surgery at [facility] healing well  Problem # 3:  DYSLIPIDEMIA (ICD-272.4) pt is fasting. will check labs today Her  updated medication list for this problem includes:    Crestor 20 Mg Tabs (Rosuvastatin calcium) .Marland Kitchen... 1 tablet by mouth at night for cholesterol    Zetia 10 Mg Tabs (Ezetimibe) .Marland Kitchen... 1 tablet by mouth once daily  Orders: T-Lipid Profile (54098-11914) T-Hepatic Function [phone])  Complete Medication List: 1)  Amitriptyline Hcl 50 Mg Tabs (Amitriptyline hcl) .... 2 tablets by mouth at night for headache 2)  Crestor 20 Mg Tabs (Rosuvastatin calcium) .Marland Kitchen.. 1 tablet by mouth at night for cholesterol 3)  Zetia 10 Mg Tabs (Ezetimibe) .Marland Kitchen.. 1 tablet by mouth once daily 4)  Lisinopril-hydrochlorothiazide 20-25 Mg Tabs (Lisinopril-hydrochlorothiazide) .Marland Kitchen.. 1 tablet by mouth daily 5)  Protonix 40 Mg Tbec (Pantoprazole sodium) .... One tablet by mouth daily 6)  Propranolol Hcl 20 Mg Tabs (Propranolol hcl) .Marland Kitchen.. 1 tablet by mouth twice daily 7)  Aspirin Ec 81 Mg Tbec (Aspirin) .Marland Kitchen.. 1 tablet by mouth daily for circulation 8)  Calcium Antacid Extra Strength 750 Mg Chew (Calcium carbonate antacid) .... 2 tablets by mouth three times a day  Hypertension Assessment/Plan:      The patient's hypertensive risk group is category B: At least one risk factor (excluding diabetes) with no target organ damage.  Her calculated 10 year risk of coronary heart disease is 17 %.  Today's blood pressure is 144/95.  Her blood pressure goal is < 140/90.  Lipid Assessment/Plan:      Based on NCEP/ATP III, the patient's risk factor category is "2 or more risk factors and a calculated 10 year CAD risk of < 20%".  The patient's lipid goals are as follows: Total cholesterol goal is 200; LDL cholesterol goal is 130; HDL cholesterol goal is 40; Triglyceride goal is 150.    Patient Instructions: 1)  Ear pain - most likely due to allegies.   2)  You should take allegra daily (buy over the counter) 3)  Blood pressure is slightly elevated today.  Will need to monitor on your next visit and is still elevated titrate medications 4)   You will be notified of any abnormal labs

## 2010-08-22 NOTE — Letter (Signed)
Summary: [facility]OUTPATIENT CHEMISTRY RESULT  [facility]OUTPATIENT CHEMISTRY RESULT   Imported By: Arta Bruce 09/20/2009 14:52:27  _____________________________________________________________________  External Attachment:    Type:   Image     Comment:   External Document

## 2010-08-24 NOTE — Progress Notes (Signed)
Summary: Refill of lisinopril  Phone Note Call from Patient   Summary of Call: pt's husband called got 3 refills from [facility] wed. but did not get the lisinoprol for blood pressure  Follow-up for Phone Call        COMPUTER SHUT DOWN BEFORE I FINISHED WRITING  PHONE NOTE///NEEDS LISINOPRIL FOR BLOOD PRESSURE//PICKED UP 3 REFILLS WED ,BUT DID'NT HAVE THIS ONE/USES [facility] [address]//PHONE # IS LISTED IN RECORDS Follow-up by: Arta Bruce,  July 06, 2010 4:53 PM  Additional Follow-up for Phone Call Additional follow up Details #1::        Lisinopril was d/c'd on last month's visit.  Does not need refill.  Left message on answering machine for pt. to return call.  Dutch Quint RN  July 06, 2010 5:11 PM     Additional Follow-up for Phone Call Additional follow up Details #2::    Pt. called... adv. her that her lisinipril was discon't at the hosp. and she is to take  Propranolol Hcl 20 Mg Tabs (Propranolol hcl) ..... One tablet by mouth three times a day .... Hale Drone CMA  July 07, 2010 10:49 AM

## 2010-08-24 NOTE — Assessment & Plan Note (Signed)
Summary: Depression   Vital Signs:  Patient profile:   65 year old female Menstrual status:  postmenopausal Weight:      208 pounds Temp:     97.9 degrees F oral Pulse rate:   84 / minute Pulse rhythm:   regular Resp:     20 per minute BP sitting:   158 / 106  (left arm) Cuff size:   regular  Vitals Entered By: Hale Drone CMA (July 12, 2010 10:32 AM) CC: 6 week f/u. No longer having abd. pain but is having lower back pain. Believes it may be due to kidney stones. BP was high. Complaining of HA's, feeling lighted headed, blurry vision and ankle swelling. Denies chest pains. States her BP yesterday morning was 170/115. , Hypertension Management, Depression, Lipid Management Pain Assessment Patient in pain? yes     Location: lower back Intensity: 6 Type: throbbing/sharp  Does patient need assistance? Functional Status Self care Ambulation Normal   Primary Care Provider:  Lehman Prom FNP  CC:  6 week f/u. No longer having abd. pain but is having lower back pain. Believes it may be due to kidney stones. BP was high. Complaining of HA's, feeling lighted headed, blurry vision and ankle swelling. Denies chest pains. States her BP yesterday morning was 170/115. , Hypertension Management, Depression, and Lipid Management.  History of Present Illness:  Pt into the office for 6 week f/u - depression  Community liason present today to interpret for pt  Depression History:      The patient comes in today for her third follow up visit for depression.  The patient is having a depressed mood most of the day but denies diminished interest in her usual daily activities.  The patient denies recurrent thoughts of death or suicide.        Psychosocial stress factors include the recent death of a loved one and major life changes.  The patient denies that she feels like life is not worth living, denies that she wishes that she were dead, and denies that she has thought about ending her  life.        Comments:  pt did see Aquilla Solian as ordered.  Depression Treatment History:  Prior Medication Used:   Start Date: Assessment of Effect:   Comments:  Zoloft (sertraline)     03/17/2010   some improvement     dose increased  Hypertension History:      She denies headache, chest pain, and palpitations.  pt has not been taking her lisinopril. She needs a refill.        Positive major cardiovascular risk factors include female age 21 years old or older, hyperlipidemia, hypertension, and family history for ischemic heart disease (females less than 38 years old).  Negative major cardiovascular risk factors include no history of diabetes and non-tobacco-user status.        Further assessment for target organ damage reveals no history of ASHD, cardiac end-organ damage (CHF/LVH), stroke/TIA, peripheral vascular disease, renal insufficiency, or hypertensive retinopathy.    Lipid Management History:      Positive NCEP/ATP III risk factors include female age 37 years old or older, family history for ischemic heart disease (females less than 79 years old), and hypertension.  Negative NCEP/ATP III risk factors include non-diabetic, non-tobacco-user status, no ASHD (atherosclerotic heart disease), no prior stroke/TIA, no peripheral vascular disease, and no history of aortic aneurysm.        The patient states that she knows about the "  Therapeutic Lifestyle Change" diet.  Her compliance with the TLC diet is fair.  Comments include: no current medications for cholesterol. This was stopped when pt was in the hospital.       Habits & Providers  Alcohol-Tobacco-Diet     Alcohol drinks/day: 0     Tobacco Status: never  Exercise-Depression-Behavior     Does Patient Exercise: no     Depression Counseling: further diagnostic testing and/or other treatment is indicated     Drug Use: no     Sun Exposure: occasionally  Current Medications (verified): 1)  Amitriptyline Hcl 50 Mg  Tabs  (Amitriptyline Hcl) .... 2 Tablets By Mouth At Night For Headache 2)  Propranolol Hcl 20 Mg  Tabs (Propranolol Hcl) .... One Tablet By Mouth Three Times A Day 3)  Aspirin Ec 81 Mg  Tbec (Aspirin) .Marland Kitchen.. 1 Tablet By Mouth Daily For Circulation 4)  Calcium Antacid Extra Strength 750 Mg Chew (Calcium Carbonate Antacid) .... 2 Tablets By Mouth Three Times A Day 5)  Fish Oil 1000 Mg Caps (Omega-3 Fatty Acids) .... One Capsule By Mouth Two Times A Day 6)  Tylenol 325 Mg Tabs (Acetaminophen) .Marland Kitchen.. 1-2 Tablets By Mouth Every 6 Hours As Needed For Pain 7)  Sertraline Hcl 50 Mg Tabs (Sertraline Hcl) .... One Tablet By Mouth Nightly For Mood 8)  Alprazolam 0.5 Mg Tabs (Alprazolam) .... One Tablet By Mouth Daily As Needed For Nerves  Allergies (verified): 1)  ! Morphine  Review of Systems General:  Denies fever. CV:  Denies fatigue. Resp:  Denies cough. GI:  Denies abdominal pain, nausea, and vomiting.  Physical Exam  General:  alert.   Head:  normocephalic.   Eyes:  glasses Lungs:  normal breath sounds.   Heart:  normal rate and regular rhythm.   Msk:  up to the exam table Neurologic:  alert & oriented X3.   Skin:  color normal.   Psych:  Oriented X3.     Impression & Recommendations:  Problem # 1:  DEPRESSION (ICD-311) pt has been to see amanda vaughn and was referred to psych advised pt to find ways to keep occupied. pt is using alprazolam very sparingly. still some left in bottle from last refill Her updated medication list for this problem includes:    Amitriptyline Hcl 50 Mg Tabs (Amitriptyline hcl) .Marland Kitchen... 2 tablets by mouth at night for headache    Sertraline Hcl 50 Mg Tabs (Sertraline hcl) .Marland Kitchen... 2 tablet by mouth nightly for mood    Alprazolam 0.5 Mg Tabs (Alprazolam) ..... One tablet by mouth daily as needed for nerves  Problem # 2:  HYPERTENSION, BENIGN ESSENTIAL (ICD-401.1) BP elevated will restart lisinopril/HCTZ Her updated medication list for this problem includes:     Propranolol Hcl 20 Mg Tabs (Propranolol hcl) ..... One tablet by mouth two times a day    Lisinopril-hydrochlorothiazide 10-12.5 Mg Tabs (Lisinopril-hydrochlorothiazide) ..... One tablet by mouth daily for blood pressure  Problem # 3:  DYSLIPIDEMIA (ICD-272.4)  pt is not fasting today for labs she will schedule a fasting lab visit  Problem # 4:  OBESITY (ICD-278.00) weight increasing encourage exercise  Complete Medication List: 1)  Amitriptyline Hcl 50 Mg Tabs (Amitriptyline hcl) .... 2 tablets by mouth at night for headache 2)  Propranolol Hcl 20 Mg Tabs (Propranolol hcl) .... One tablet by mouth two times a day 3)  Aspirin Ec 81 Mg Tbec (Aspirin) .Marland Kitchen.. 1 tablet by mouth daily for circulation 4)  Calcium Antacid Extra Strength  750 Mg Chew (Calcium carbonate antacid) .... 2 tablets by mouth three times a day 5)  Fish Oil 1000 Mg Caps (Omega-3 fatty acids) .... One capsule by mouth two times a day 6)  Tylenol 325 Mg Tabs (Acetaminophen) .Marland Kitchen.. 1-2 tablets by mouth every 6 hours as needed for pain 7)  Sertraline Hcl 50 Mg Tabs (Sertraline hcl) .... 2 tablet by mouth nightly for mood 8)  Alprazolam 0.5 Mg Tabs (Alprazolam) .... One tablet by mouth daily as needed for nerves 9)  Lisinopril-hydrochlorothiazide 10-12.5 Mg Tabs (Lisinopril-hydrochlorothiazide) .... One tablet by mouth daily for blood pressure  Other Orders: Pneumococcal Vaccine (04540) Admin 1st Vaccine (98119)  Hypertension Assessment/Plan:      The patient's hypertensive risk group is category B: At least one risk factor (excluding diabetes) with no target organ damage.  Her calculated 10 year risk of coronary heart disease is 20 %.  Today's blood pressure is 158/106.  Her blood pressure goal is < 140/90.  Lipid Assessment/Plan:      Based on NCEP/ATP III, the patient's risk factor category is "2 or more risk factors and a calculated 10 year CAD risk of < 20%".  The patient's lipid goals are as follows: Total cholesterol goal  is 200; LDL cholesterol goal is 130; HDL cholesterol goal is 40; Triglyceride goal is 150.    Patient Instructions: 1)  Schedule an appointment for fasting labs - no food after midnight before this visit.  You may drink water only. 2)  will need lipids (272.4), cmet (401.1), cbc (530.81), TSH (311.0 & 280.0) 3)  Blood pressure - most likely high becuase you have not been taking lisinopril.  Headaches and dizziness may be due to increased blood pressure.  4)  You have received the pnuemovax today 5)  Keep your appointment with the [facility] center.  Be sure to tell them about the changes in your medications.  They may want to change your medications and this is ok just let me know what your medications are changed to. 6)  Follow up in 2 months with n.martin,fnp for blood pressure and depression. or sooner if dizziness continues Prescriptions: LISINOPRIL-HYDROCHLOROTHIAZIDE 10-12.5 MG TABS (LISINOPRIL-HYDROCHLOROTHIAZIDE) One tablet by mouth daily for blood pressure  #30 x 5   Entered and Authorized by:   Lehman Prom FNP   Signed by:   Lehman Prom FNP on 07/12/2010   Method used:   Print then Give to Patient   RxID:   [id number] SERTRALINE HCL 50 MG TABS (SERTRALINE HCL) 2 tablet by mouth nightly for mood  #60 x 5   Entered and Authorized by:   Lehman Prom FNP   Signed by:   Lehman Prom FNP on 07/12/2010   Method used:   Print then Give to Patient   RxID:   [id number] PROPRANOLOL HCL 20 MG  TABS (PROPRANOLOL HCL) One tablet by mouth two times a day  #60 x 5   Entered and Authorized by:   Lehman Prom FNP   Signed by:   Lehman Prom FNP on 07/12/2010   Method used:   Print then Give to Patient   RxID:   [id number]    Orders Added: 1)  Pneumococcal Vaccine [42595] 2)  Admin 1st Vaccine [90471] 3)  Est. Patient Level III [63875]   Immunizations Administered:  Pneumonia Vaccine:    Vaccine Type: Pneumovax (Medicare)    Site: right deltoid     Mfr: Merck    Dose: 0.5 ml    Route: IM  Given by: Hale Drone CMA    Exp. Date: 11/26/2011    Lot #: [id number]    VIS given: 06/27/09 version given July 12, 2010.   Immunizations Administered:  Pneumonia Vaccine:    Vaccine Type: Pneumovax (Medicare)    Site: right deltoid    Mfr: Merck    Dose: 0.5 ml    Route: IM    Given by: Hale Drone CMA    Exp. Date: 11/26/2011    Lot #: [id number]    VIS given: 06/27/09 version given July 12, 2010.   Prevention & Chronic Care Immunizations   Influenza vaccine: given in hospital  (04/24/2010)    Tetanus booster: Not documented    Pneumococcal vaccine: Pneumovax (Medicare)  (07/12/2010)    H. zoster vaccine: Not documented  Colorectal Screening   Hemoccult: Not documented    Colonoscopy: H. Pylori  (07/24/2003)  Other Screening   Pap smear: Not documented    Mammogram: ASSESSMENT: Negative - BI-RADS 1^MM DIGITAL SCREENING  (06/13/2010)   Mammogram action/deferral: Screening mammogram in 1 year.     (01/18/2009)    DXA bone density scan: Not documented   Smoking status: never  (07/12/2010)  Lipids   Total Cholesterol: 296  (10/12/2009)   LDL: 194  (10/12/2009)   LDL Direct: Not documented   HDL: 57  (10/12/2009)   Triglycerides: 226  (10/12/2009)    SGOT (AST): 17  (10/12/2009)   SGPT (ALT): 10  (10/12/2009)   Alkaline phosphatase: 118  (10/12/2009)   Total bilirubin: 0.4  (10/12/2009)  Hypertension   Last Blood Pressure: 158 / 106  (07/12/2010)   Serum creatinine: 0.67  (01/11/2009)   Serum potassium 4.8  (01/11/2009)  Self-Management Support :    Hypertension self-management support: Not documented    Lipid self-management support: Not documented

## 2010-08-24 NOTE — Letter (Signed)
Summary: Lipid Letter  [facility]  [address] [place], [place] [address]   Phone: [phone]  Fax: [phone]    07/27/2010  Kendra [name] [address] [place], [place]  [address]  Dear Ms. Hackel:  We have carefully reviewed your last lipid profile from 07/25/2010 and the results are noted below with a summary of recommendations for lipid management.    Cholesterol:       308     Goal: less than 200   HDL "good" Cholesterol:   46     Goal: greater than 40   LDL "bad" Cholesterol:   224     Goal: less than 100   Triglycerides:       190     Goal: less than 150    You should have been called by this office about the need to restart your cholesterol medications. You will need your labs rechecked in 6 weeks    Current Medications: 1)    Amitriptyline Hcl 50 Mg  Tabs (Amitriptyline hcl) .... 2 tablets by mouth at night for headache 2)    Propranolol Hcl 20 Mg  Tabs (Propranolol hcl) .... One tablet by mouth two times a day 3)    Aspirin Ec 81 Mg  Tbec (Aspirin) .Marland Kitchen.. 1 tablet by mouth daily for circulation 4)    Calcium Antacid Extra Strength 750 Mg Chew (Calcium carbonate antacid) .... 2 tablets by mouth three times a day 5)    Fish Oil 1000 Mg Caps (Omega-3 fatty acids) .... One capsule by mouth two times a day 6)    Tylenol 325 Mg Tabs (Acetaminophen) .Marland Kitchen.. 1-2 tablets by mouth every 6 hours as needed for pain 7)    Sertraline Hcl 50 Mg Tabs (Sertraline hcl) .... 2 tablet by mouth nightly for mood 8)    Alprazolam 0.5 Mg Tabs (Alprazolam) .... One tablet by mouth daily as needed for nerves 9)    Lisinopril-hydrochlorothiazide 10-12.5 Mg Tabs (Lisinopril-hydrochlorothiazide) .... One tablet by mouth daily for blood pressure 10) Crestor 10mg  ... One tablet by mouth nightly for cholesterol If you have any questions, please call. We appreciate being able to work with you.   Sincerely,    [facility] Lehman Prom FNP

## 2010-09-06 ENCOUNTER — Encounter (INDEPENDENT_AMBULATORY_CARE_PROVIDER_SITE_OTHER): Payer: Self-pay | Admitting: Nurse Practitioner

## 2010-09-06 ENCOUNTER — Telehealth (INDEPENDENT_AMBULATORY_CARE_PROVIDER_SITE_OTHER): Payer: Self-pay | Admitting: Nurse Practitioner

## 2010-09-06 DIAGNOSIS — K047 Periapical abscess without sinus: Secondary | ICD-10-CM | POA: Insufficient documentation

## 2010-09-12 ENCOUNTER — Encounter: Payer: Self-pay | Admitting: Nurse Practitioner

## 2010-09-12 ENCOUNTER — Encounter (INDEPENDENT_AMBULATORY_CARE_PROVIDER_SITE_OTHER): Payer: Self-pay | Admitting: Nurse Practitioner

## 2010-09-12 LAB — CONVERTED CEMR LAB
HDL: 55 mg/dL (ref >39)
LDL Cholesterol: 122 mg/dL — ABNORMAL HIGH (ref 0–99)
Total CHOL/HDL Ratio: 3.8

## 2010-09-13 ENCOUNTER — Encounter (INDEPENDENT_AMBULATORY_CARE_PROVIDER_SITE_OTHER): Payer: Self-pay | Admitting: Nurse Practitioner

## 2010-09-19 NOTE — Assessment & Plan Note (Signed)
Summary: HTN   Vital Signs:  Patient profile:   65 year old female Menstrual status:  postmenopausal Weight:      205.3 pounds Temp:     97.4 degrees F oral Pulse rate:   80 / minute Pulse rhythm:   regular Resp:     20 per minute BP sitting:   110 / 74  (left arm) Cuff size:   regular  Vitals Entered By: Levon Hedger (September 12, 2010 12:10 PM) CC: dizziness, Hypertension Management, Lipid Management, Headache, Abdominal Pain, Depression Pain Assessment Patient in pain? no       Does patient need assistance? Functional Status Self care Ambulation Normal   Primary Care Provider:  Lehman Prom FNP  CC:  dizziness, Hypertension Management, Lipid Management, Headache, Abdominal Pain, and Depression.  History of Present Illness: Pt into the office for follow up on diabetes  Depression History:      The patient denies recurrent thoughts of death or suicide.        The patient denies that she feels like life is not worth living, denies that she wishes that she were dead, and denies that she has thought about ending her life.         Depression Treatment History:  Prior Medication Used:   Start Date: Assessment of Effect:   Comments:  Zoloft (sertraline)     03/17/2010   some improvement     dose increased  Dyspepsia History:      She has no alarm features of dyspepsia including no history of melena, hematochezia, dysphagia, persistent vomiting, or involuntary weight loss > 5%.  There is a prior history of GERD.  The patient has a prior history of documented ulcer disease.  The dominant symptom is not heartburn or acid reflux.  An H-2 blocker medication is not currently being taken.    Headache HPI:      The headaches are not associated with an aura.  The location of the headaches are unilateral-right.  Headache quality is throbbing or pulsating.  The headaches are associated with nausea.         Headache Treatment History:      She has tried beta blockers and  tricyclic antidepressants which were effective.    Hypertension History:      She denies headache, chest pain, and palpitations.  She notes no problems with any antihypertensive medication side effects.        Positive major cardiovascular risk factors include female age 63 years old or older, hyperlipidemia, hypertension, and family history for ischemic heart disease (females less than 78 years old).  Negative major cardiovascular risk factors include no history of diabetes and non-tobacco-user status.        Further assessment for target organ damage reveals no history of ASHD, cardiac end-organ damage (CHF/LVH), stroke/TIA, peripheral vascular disease, renal insufficiency, or hypertensive retinopathy.    Lipid Management History:      Positive NCEP/ATP III risk factors include female age 3 years old or older, family history for ischemic heart disease (females less than 109 years old), and hypertension.  Negative NCEP/ATP III risk factors include non-diabetic, non-tobacco-user status, no ASHD (atherosclerotic heart disease), no prior stroke/TIA, no peripheral vascular disease, and no history of aortic aneurysm.        The patient states that she knows about the "Therapeutic Lifestyle Change" diet.  Her compliance with the TLC diet is fair.  She expresses no side effects from her lipid-lowering medication.  Comments include: pt  is taking crestor .  The patient denies any symptoms to suggest myopathy or liver disease.         Allergies (verified): 1)  ! Morphine  Review of Systems General:  Denies fever. CV:  Denies chest pain or discomfort. Resp:  Denies cough. GI:  Denies abdominal pain, nausea, and vomiting. Neuro:  Complains of headaches. Psych:  Complains of depression.  Physical Exam  General:  alert.   Head:  normocephalic.   Lungs:  normal breath sounds.   Heart:  normal rate and regular rhythm.   Abdomen:  normal bowel sounds.   Neurologic:  alert & oriented X3.   Skin:   color normal.   Psych:  Oriented X3.     Impression & Recommendations:  Problem # 1:  HYPERTENSION, BENIGN ESSENTIAL (ICD-401.1) BP is stable continue current meds Her updated medication list for this problem includes:    Propranolol Hcl 20 Mg Tabs (Propranolol hcl) ..... One tablet by mouth two times a day    Lisinopril-hydrochlorothiazide 10-12.5 Mg Tabs (Lisinopril-hydrochlorothiazide) ..... One tablet by mouth daily for blood pressure  Problem # 2:  DYSLIPIDEMIA (ICD-272.4) crestor samples given will check labs today Her updated medication list for this problem includes:    Crestor 20 Mg Tabs (Rosuvastatin calcium) ..... One tablet by mouth daily for cholesterol  Orders: T-Lipid Profile (34742-59563)  Problem # 3:  GERD (ICD-530.81)  Her updated medication list for this problem includes:    Calcium Antacid Extra Strength 750 Mg Chew (Calcium carbonate antacid) .Marland Kitchen... 2 tablets by mouth three times a day  Problem # 4:  DEPRESSION (ICD-311)  The following medications were removed from the medication list:    Alprazolam 0.5 Mg Tabs (Alprazolam) ..... One tablet by mouth daily as needed for nerves Her updated medication list for this problem includes:    Amitriptyline Hcl 50 Mg Tabs (Amitriptyline hcl) .Marland Kitchen... 2 tablets by mouth at night for headache    Sertraline Hcl 50 Mg Tabs (Sertraline hcl) .Marland Kitchen... 2 tablet by mouth nightly for mood    Clonazepam 0.5 Mg Tabs (Clonazepam) ..... One tablet by mouth two times a day as needed for nerves **rx by [facility]**  Problem # 5:  MIGRAINE HEADACHE (ICD-346.90)  Her updated medication list for this problem includes:    Propranolol Hcl 20 Mg Tabs (Propranolol hcl) ..... One tablet by mouth two times a day    Aspirin Ec 81 Mg Tbec (Aspirin) .Marland Kitchen... 1 tablet by mouth daily for circulation    Tylenol 325 Mg Tabs (Acetaminophen) .Marland Kitchen... 1-2 tablets by mouth every 6 hours as needed for pain  Complete Medication List: 1)  Amitriptyline  Hcl 50 Mg Tabs (Amitriptyline hcl) .... 2 tablets by mouth at night for headache 2)  Propranolol Hcl 20 Mg Tabs (Propranolol hcl) .... One tablet by mouth two times a day 3)  Aspirin Ec 81 Mg Tbec (Aspirin) .Marland Kitchen.. 1 tablet by mouth daily for circulation 4)  Calcium Antacid Extra Strength 750 Mg Chew (Calcium carbonate antacid) .... 2 tablets by mouth three times a day 5)  Fish Oil 1000 Mg Caps (Omega-3 fatty acids) .... One capsule by mouth two times a day 6)  Tylenol 325 Mg Tabs (Acetaminophen) .Marland Kitchen.. 1-2 tablets by mouth every 6 hours as needed for pain 7)  Sertraline Hcl 50 Mg Tabs (Sertraline hcl) .... 2 tablet by mouth nightly for mood 8)  Lisinopril-hydrochlorothiazide 10-12.5 Mg Tabs (Lisinopril-hydrochlorothiazide) .... One tablet by mouth daily for blood pressure 9)  Crestor 20 Mg Tabs (Rosuvastatin calcium) .... One tablet by mouth daily for cholesterol 10)  Clonazepam 0.5 Mg Tabs (Clonazepam) .... One tablet by mouth two times a day as needed for nerves **rx by [facility]**  Hypertension Assessment/Plan:      The patient's hypertensive risk group is category B: At least one risk factor (excluding diabetes) with no target organ damage.  Her calculated 10 year risk of coronary heart disease is 9 %.  Today's blood pressure is 110/74.  Her blood pressure goal is < 140/90.  Lipid Assessment/Plan:      Based on NCEP/ATP III, the patient's risk factor category is "2 or more risk factors and a calculated 10 year CAD risk of < 20%".  The patient's lipid goals are as follows: Total cholesterol goal is 200; LDL cholesterol goal is 130; HDL cholesterol goal is 40; Triglyceride goal is 150.    Patient Instructions: 1)  Dental clinic - your referral has been sent to the dental clinic.  You will be notified of the time/date of the appointment.  Take the amoxil 500mg  by mouth three times a day for infection 2)  Cholesterol - Your labs will be checked today.  You will be notified of the  results. 3)  Keep your appointment to the [facility] as ordered 4)  Follow up in 2 months for blood pressure and cholesterol. Prescriptions: CRESTOR 20 MG TABS (ROSUVASTATIN CALCIUM) One tablet by mouth daily for cholesterol  #43 x 0   Entered and Authorized by:   Lehman Prom FNP   Signed by:   Lehman Prom FNP on 09/12/2010   Method used:   Samples Given   RxID:   [id number]    Orders Added: 1)  Est. Patient Level IV [95621] 2)  T-Lipid Profile [[id number]]

## 2010-09-19 NOTE — Letter (Signed)
Summary: Lipid Letter  [facility]  [address] [place], [place] [address]   Phone: [phone]  Fax: [phone]    09/13/2010  Kendra [name] [address] [place], [place]  [address]  Dear [name]:  We have carefully reviewed your last lipid profile from 09/12/2010 and the results are noted below with a summary of recommendations for lipid management.    Cholesterol:      210    Goal: less than 200   HDL "good" Cholesterol:   55     Goal: greater than 40   LDL "bad" Cholesterol:  122    Goal: less than 100   Triglycerides:      163    Goal: less than 150  Cholesterol lab have improved.  Keep taking your cholesterol medication as ordered.    Current Medications: 1)    Amitriptyline Hcl 50 Mg  Tabs (Amitriptyline hcl) .... 2 tablets by mouth at night for headache 2)    Propranolol Hcl 20 Mg  Tabs (Propranolol hcl) .... One tablet by mouth two times a day 3)    Aspirin Ec 81 Mg  Tbec (Aspirin) .Marland Kitchen.. 1 tablet by mouth daily for circulation 4)    Calcium Antacid Extra Strength 750 Mg Chew (Calcium carbonate antacid) .... 2 tablets by mouth three times a day 5)    Fish Oil 1000 Mg Caps (Omega-3 fatty acids) .... One capsule by mouth two times a day 6)    Tylenol 325 Mg Tabs (Acetaminophen) .Marland Kitchen.. 1-2 tablets by mouth every 6 hours as needed for pain 7)    Sertraline Hcl 50 Mg Tabs (Sertraline hcl) .... 2 tablet by mouth nightly for mood 8)    Lisinopril-hydrochlorothiazide 10-12.5 Mg Tabs (Lisinopril-hydrochlorothiazide) .... One tablet by mouth daily for blood pressure 9)    Crestor 20 Mg Tabs (Rosuvastatin calcium) .... One tablet by mouth daily for cholesterol 10)    Clonazepam 0.5 Mg Tabs (Clonazepam) .... One tablet by mouth two times a day as needed for nerves **rx by [facility]**  If you have any questions, please call. We appreciate being able to work with you.   Sincerely,    [facility] Lehman Prom FNP

## 2010-09-19 NOTE — Progress Notes (Signed)
Summary: AMOXICILLIN  REFILL for tooth pain  Phone Note Call from Patient   Summary of Call: PT IS HAVING A TOOTH PAIN AND SHE NEED A REFILL OF AMOXICILLIN PT SAID THAT FNP MARTIN PRESCRIBE HER IN [facility] [address] . Fuller Song PT [phone] THANK YOU  Initial call taken by: Cheryll Dessert,  September 06, 2010 3:28 PM  Follow-up for Phone Call        Voicemail mailbox is full.  Dutch Quint RN  September 06, 2010 4:04 PM  Voice mailbox is full -- cannot accept new messages.  Dutch Quint RN  September 07, 2010 12:36 PM  Has a hole in the tooth and it hurts - the nerve is exposed -- it is the same lower molar on the right.  Her whole cheek is in pain, no swelling, no drainage, no fever.  Has sore throat and sore ear on right side.  Had treatment from pharmacy to cover the hole, uses anbesol to help her get to sleep, treatment used came out of tooth.  Advised to use OTC NSAIDS with foods.  If she can't take NSAIDS, to take Tylenol ES 2 tabs no more than every 6 hours.  Also wants refills on amitriptyline -- refills completed- pt. notified. Follow-up by: Dutch Quint RN,  September 08, 2010 4:00 PM  Additional Follow-up for Phone Call Additional follow up Details #1::        Pt needs the tooth extracted.  will do an urgent dental referral given her symptoms and i have seen her before for this but it has progressed. be advised that the dental services have been cut so i don't know when she will be seen.  she can call later this week to see where she is on the list.  however advise pt that if she has a private dentist she can go to for extraction that will be better.  will refill amoxil 500mg  three times a day for infection (rx in basket)  New Problems: ABSCESS, TOOTH (ICD-522.5)   Additional Follow-up for Phone Call Additional follow up Details #2::    SENT A REFERRAL TO [facility]    [facility] # [id number] ADDRESS [address]  THEY WILL CONTACT THE PT TO MADE AN  APPT.Marland KitchenCheryll Dessert  September 11, 2010 9:01 AM  Pt. notified re Rx - faxed to [facility] [address] per request.  Also notified re urgent dental referral and provider's response re same -- does not have private dentist, given number of dental clinic to check for dental appt.  Dutch Quint RN  September 11, 2010 12:20 PM   New Problems: ABSCESS, TOOTH (ICD-522.5) New/Updated Medications: AMOXICILLIN 500 MG TABS (AMOXICILLIN) One tablet by mouth three times a day for infection Prescriptions: AMOXICILLIN 500 MG TABS (AMOXICILLIN) One tablet by mouth three times a day for infection  #30 x 0   Entered and Authorized by:   Lehman Prom FNP   Signed by:   Lehman Prom FNP on 09/11/2010   Method used:   Printed then faxed to ...       [facility] [address] [phone]* (retail)       [address]       [place], [place]  [address]       Ph: [phone]       Fax: [phone]   RxID:   [id number] AMITRIPTYLINE HCL 50 MG  TABS (AMITRIPTYLINE HCL) 2 tablets by mouth at night for headache  #60 x  2   Entered by:   Dutch Quint RN   Authorized by:   Lehman Prom FNP   Signed by:   Dutch Quint RN on 09/08/2010   Method used:   Electronically to        [facility] [phone]* (retail)       [address]       [place], [place]  [address]       Ph: [phone]       Fax: [phone]   RxID:   [id number]

## 2010-09-19 NOTE — Letter (Signed)
Summary: DENTAL REFERRAL  DENTAL REFERRAL   Imported By: Arta Bruce 09/11/2010 10:14:52  _____________________________________________________________________  External Attachment:    Type:   Image     Comment:   External Document

## 2010-10-05 LAB — DIFFERENTIAL
Basophils Absolute: 0 10*3/uL (ref 0.0–0.1)
Basophils Relative: 1 % (ref 0–1)
Basophils Relative: 1 % (ref 0–1)
Eosinophils Absolute: 0.2 10*3/uL (ref 0.0–0.7)
Lymphocytes Relative: 17 % (ref 12–46)
Lymphs Abs: 1.4 10*3/uL (ref 0.7–4.0)
Monocytes Absolute: 0.4 10*3/uL (ref 0.1–1.0)
Monocytes Absolute: 0.4 10*3/uL (ref 0.1–1.0)
Monocytes Relative: 5 % (ref 3–12)
Monocytes Relative: 7 % (ref 3–12)
Monocytes Relative: 5 % (ref 3–12)
Neutro Abs: 5.6 10*3/uL (ref 1.7–7.7)
Neutro Abs: 6.4 10*3/uL (ref 1.7–7.7)
Neutrophils Relative %: 71 % (ref 43–77)

## 2010-10-05 LAB — COMPREHENSIVE METABOLIC PANEL
ALT: 623 U/L — ABNORMAL HIGH (ref 0–35)
AST: 588 U/L — ABNORMAL HIGH (ref 0–37)
Albumin: 3.6 g/dL (ref 3.5–5.2)
Albumin: 3.7 g/dL (ref 3.5–5.2)
Alkaline Phosphatase: 354 U/L — ABNORMAL HIGH (ref 39–117)
Alkaline Phosphatase: 370 U/L — ABNORMAL HIGH (ref 39–117)
BUN: 5 mg/dL — ABNORMAL LOW (ref 6–23)
Chloride: 95 mEq/L — ABNORMAL LOW (ref 96–112)
Glucose, Bld: 138 mg/dL — ABNORMAL HIGH (ref 70–99)
Potassium: 3.7 mEq/L (ref 3.5–5.1)
Potassium: 3.7 mEq/L (ref 3.5–5.1)
Sodium: 134 mEq/L — ABNORMAL LOW (ref 135–145)
Total Bilirubin: 1 mg/dL (ref 0.3–1.2)
Total Protein: 7 g/dL (ref 6.0–8.3)

## 2010-10-05 LAB — CBC
HCT: 42.8 % (ref 36.0–46.0)
Hemoglobin: 13.2 g/dL (ref 12.0–15.0)
Hemoglobin: 14.6 g/dL (ref 12.0–15.0)
MCH: 32 pg (ref 26.0–34.0)
MCHC: 33.9 g/dL (ref 30.0–36.0)
MCHC: 34.1 g/dL (ref 30.0–36.0)
Platelets: 221 10*3/uL (ref 150–400)
Platelets: 227 10*3/uL (ref 150–400)
RBC: 4.57 MIL/uL (ref 3.87–5.11)
RDW: 14.2 % (ref 11.5–15.5)
RDW: 14.4 % (ref 11.5–15.5)
WBC: 7.9 10*3/uL (ref 4.0–10.5)

## 2010-10-05 LAB — POCT CARDIAC MARKERS
Myoglobin, poc: 31.4 ng/mL (ref 12–200)
Troponin i, poc: <0.05 ng/mL (ref 0.00–0.09)

## 2010-10-05 LAB — HEPATIC FUNCTION PANEL
AST: 138 U/L — ABNORMAL HIGH (ref 0–37)
AST: 1277 U/L — ABNORMAL HIGH (ref 0–37)
Albumin: 3.2 g/dL — ABNORMAL LOW (ref 3.5–5.2)
Albumin: 3.4 g/dL — ABNORMAL LOW (ref 3.5–5.2)
Bilirubin, Direct: 2.2 mg/dL — ABNORMAL HIGH (ref 0.0–0.3)
Total Protein: 6.4 g/dL (ref 6.0–8.3)

## 2010-10-05 LAB — TSH: TSH: 1.223 u[IU]/mL (ref 0.350–4.500)

## 2010-10-05 LAB — BASIC METABOLIC PANEL
CO2: 31 mEq/L (ref 19–32)
Calcium: 8.6 mg/dL (ref 8.4–10.5)
Creatinine, Ser: 0.54 mg/dL (ref 0.4–1.2)
GFR calc Af Amer: >60 mL/min (ref >60)
GFR calc non Af Amer: >60 mL/min (ref >60)
Glucose, Bld: 122 mg/dL — ABNORMAL HIGH (ref 70–99)
Glucose, Bld: 113 mg/dL — ABNORMAL HIGH (ref 70–99)
Potassium: 4.2 mEq/L (ref 3.5–5.1)
Sodium: 140 mEq/L (ref 135–145)
Sodium: 138 mEq/L (ref 135–145)

## 2010-10-05 LAB — CARDIAC PANEL(CRET KIN+CKTOT+MB+TROPI)
CK, MB: 0.8 ng/mL (ref 0.3–4.0)
Relative Index: INVALID (ref 0.0–2.5)
Relative Index: INVALID (ref 0.0–2.5)
Total CK: 43 U/L (ref 7–177)
Total CK: 48 U/L (ref 7–177)
Total CK: 61 U/L (ref 7–177)
Troponin I: 0.03 ng/mL (ref 0.00–0.06)

## 2010-10-05 LAB — AMYLASE: Amylase: 57 U/L (ref 0–105)

## 2010-10-05 LAB — LIPASE, BLOOD: Lipase: 33 U/L (ref 11–59)

## 2010-10-05 LAB — LIPID PANEL
Cholesterol: 202 mg/dL — ABNORMAL HIGH (ref 0–200)
LDL Cholesterol: 129 mg/dL — ABNORMAL HIGH (ref 0–99)
Total CHOL/HDL Ratio: 3.7 RATIO

## 2010-10-05 LAB — HEPATITIS PANEL, ACUTE
Hep A IgM: NEGATIVE
Hep B C IgM: NEGATIVE

## 2010-10-05 LAB — APTT: aPTT: 39 seconds — ABNORMAL HIGH (ref 24–37)

## 2010-10-07 LAB — CBC
HCT: 43.9 % (ref 36.0–46.0)
Hemoglobin: 15.1 g/dL — ABNORMAL HIGH (ref 12.0–15.0)
MCHC: 34.4 g/dL (ref 30.0–36.0)
RBC: 4.63 MIL/uL (ref 3.87–5.11)
WBC: 10.5 10*3/uL (ref 4.0–10.5)

## 2010-10-07 LAB — DIFFERENTIAL
Basophils Relative: 1 % (ref 0–1)
Lymphocytes Relative: 12 % (ref 12–46)
Lymphs Abs: 1.3 10*3/uL (ref 0.7–4.0)
Monocytes Absolute: 0.4 10*3/uL (ref 0.1–1.0)
Monocytes Relative: 4 % (ref 3–12)
Neutro Abs: 8.6 10*3/uL — ABNORMAL HIGH (ref 1.7–7.7)
Neutrophils Relative %: 82 % — ABNORMAL HIGH (ref 43–77)

## 2010-10-07 LAB — GLUCOSE, CAPILLARY: Glucose-Capillary: 98 mg/dL (ref 70–99)

## 2010-10-07 LAB — BASIC METABOLIC PANEL
Calcium: 8.9 mg/dL (ref 8.4–10.5)
GFR calc Af Amer: >60 mL/min (ref >60)
GFR calc non Af Amer: >60 mL/min (ref >60)
Glucose, Bld: 145 mg/dL — ABNORMAL HIGH (ref 70–99)
Potassium: 3.9 mEq/L (ref 3.5–5.1)
Sodium: 137 mEq/L (ref 135–145)

## 2010-10-27 LAB — CBC
HCT: 41.9 % (ref 36.0–46.0)
MCV: 93.5 fL (ref 78.0–100.0)
Platelets: 282 10*3/uL (ref 150–400)
RDW: 13.7 % (ref 11.5–15.5)
WBC: 10.2 10*3/uL (ref 4.0–10.5)

## 2010-10-27 LAB — COMPREHENSIVE METABOLIC PANEL
ALT: 17 U/L (ref 0–35)
Alkaline Phosphatase: 120 U/L — ABNORMAL HIGH (ref 39–117)
Calcium: 9.9 mg/dL (ref 8.4–10.5)
GFR calc Af Amer: >60 mL/min (ref >60)
GFR calc non Af Amer: >60 mL/min (ref >60)
Glucose, Bld: 99 mg/dL (ref 70–99)
Total Bilirubin: 0.7 mg/dL (ref 0.3–1.2)

## 2010-10-27 LAB — URINALYSIS, ROUTINE W REFLEX MICROSCOPIC
Bilirubin Urine: NEGATIVE
Specific Gravity, Urine: 1.015 (ref 1.005–1.030)
Urobilinogen, UA: 1 mg/dL (ref 0.0–1.0)
pH: 6.5 (ref 5.0–8.0)

## 2010-10-27 LAB — URINE MICROSCOPIC-ADD ON

## 2010-10-27 LAB — DIFFERENTIAL
Basophils Absolute: 0 10*3/uL (ref 0.0–0.1)
Basophils Relative: 0 % (ref 0–1)
Eosinophils Absolute: 0.5 10*3/uL (ref 0.0–0.7)
Neutrophils Relative %: 66 % (ref 43–77)

## 2010-12-05 NOTE — Discharge Summary (Signed)
Kendra [name], [place]               ACCOUNT NO.:  [id number]   MEDICAL RECORD NO.:  [id number]          PATIENT TYPE:  INP   LOCATION:  4728                         FACILITY:  [facility]   PHYSICIAN:  Elliot Cousin, M.D.    DATE OF BIRTH:  01-Jul-1946   DATE OF ADMISSION:  06/27/2007  DATE OF DISCHARGE:  06/29/2007                               DISCHARGE SUMMARY   DISCHARGE DIAGNOSES:  1. Chest pain, myocardial infarction ruled out.  Most likely etiology      was gastrointestinal in origin.  2. Chronic nonsteroidal anti-inflammatory drug use secondary to      migraine headaches.  3. Migraine headaches with occasional dizziness.  CT scan of the head      was negative.  4. Hyperlipidemia.  5. Hypertension.  6. Transient hyperglycemia.  Resolved during hospital course.      Hemoglobin A1c was 5.8.  7. Depression.   DISCHARGE MEDICATIONS:  1. Carafate 10 mL four times daily or q.a.c. and nightly.  2. Vicodin 5 mg twice daily only as needed for pain.  3. Lisinopril/HCTZ 20/25 mg daily.  4. Amitriptyline 50 mg 1-2 tablets at bedtime.  5. Inderal 20 mg b.i.d.  6. Protonix 40 mg daily.  7. Prozac, question dose daily.  8. Aspirin 325 mg daily.  9. Zetia 10 mg daily.  10.Crestor 10 mg daily.  11.DISCONTINUE IBUPROFEN.   DISPOSITION:  The patient was discharged to home in improved and stable  condition on June 29, 2007.   FOLLOW UP:  She was advised to follow up with her primary care physician  at the [facility] in 1 week.   PROCEDURES PERFORMED:  1. Chest x-ray on June 27, 2007, with no focal air space disease.      Prominent interstitial markings may be chronic.  2. CT scan of the head without contrast on June 27, 2007, with no      interval change from comparison exam.  No acute intracranial      process.  Minimal white matter disease consistent with mild small      vessel ischemia.   CONSULTATIONS:  None.   HISTORY OF PRESENT ILLNESS:  The patient is a  65 year old woman with a  past medical history significant for hypertension, migraine headaches,  hyperlipidemia and gastroesophageal reflux disease.  She presented to  the emergency department on June 27, 2007, with a chief complaint of  chest pain.  She had also had intermittent headaches with some  accompanying dizziness.  The patient does have a history of migraine  headaches.  During the evaluation in the emergency department, the  patient was noted to be afebrile and hemodynamically stable.  However,  given her risk factors, she was admitted for further evaluation and  management.   For additional details, please see the dictated history and physical.   HOSPITAL COURSE:  Problem 1.  CHEST PAIN, POSSIBLY SECONDARY TO  ESOPHAGITIS OR GASTROESOPHAGEAL REFLUX DISEASE:  The patient's EKG at  the time of the hospital admission revealed normal sinus rhythm with  right atrial enlargement and a heart rate of 100 beats  per minute.  Her  initial cardiac markers were negative.  She was started empirically on a  nitroglycerin drip, prophylactic Lovenox and prophylactic Protonix.  Aspirin at 325 mg daily was continued as well as her cardiac  medications.  For further evaluation, cardiac enzymes were ordered as  well as a TSH and fasting lipid profile.  The cardiac enzymes were  completely normal during the hospitalization.  Her TSH was within normal  limits at 0.917.  Her fasting lipid profile revealed a total cholesterol  of 259, triglycerides of 104, HDL of 49 and LDL of 189.  The patient was  maintained on Zetia during the hospital course, however, the patient  informed the dictating physician that she also took Crestor 10 mg daily.  The patient was advised to continue Zetia and Crestor following hospital  discharge.  She was advised to follow a low-fat diet.  Further  management will be deferred to her primary care physician at  [facility].   The patient was also started on Carafate  four times a day during the  hospital course as her symptoms were felt to be secondary to  gastroesophageal reflux disease or esophagitis;  she has a hiatal hernia  by history.  It was also noted that the patient has a history of taking  800 mg of ibuprofen twice daily as needed for migraine headaches.  She  was strongly advised to discontinue the ibuprofen and other NSAIDS with  the exception of her daily aspirin.  She voiced understanding.  The  patient was informed that she could take Tylenol or Vicodin as  prescribed for pain.   Problem 2.  MIGRAINE HEADACHES WITH DIZZINESS:  The patient has a  history of chronic migraines.  She did have associated dizziness and  nausea at the time of her initial presentation.  Per  the neurological  exam, there were no focal deficits.  The patient was further evaluated  with a CT scan of the head and it revealed no acute findings.  The  patient was maintained on amitriptyline and Inderal initially, however,  they were held for low-normal blood pressures.  As-needed Vicodin was  added for pain.  In addition, the nitroglycerin was discontinued as this  was felt to worsen her headache.   Problem 3.  HYPERTENSION:  The patient's blood pressures were low-normal  during hospitalization.  The patient was advised to not take Inderal,  lisinopril or hydrochlorothiazide if she felt dizzy or if she felt her  blood pressure was low.   Problem 4.  HYPERGLYCEMIA:  The patient's venous glucose was 110 on  admission.  However, during the hospital course, her venous glucose  increased to 145.  Her capillary blood glucose was monitored during  hospital course and generally ranged between 88 and 110.  Her hemoglobin  A1c was found to be within normal limits at 5.8.      Elliot Cousin, M.D.  Electronically Signed     DF/MEDQ  D:  06/29/2007  T:  06/30/2007  Job:  [id number]

## 2010-12-05 NOTE — H&P (Signed)
[name], [name]               ACCOUNT NO.:  [id number]   MEDICAL RECORD NO.:  [id number]          PATIENT TYPE:  INP   LOCATION:  4728                         FACILITY:  [facility]   PHYSICIAN:  Della Goo, M.D. DATE OF BIRTH:  1945/08/15   DATE OF ADMISSION:  06/27/2007  DATE OF DISCHARGE:                              HISTORY & PHYSICAL   PRIMARY CARE PHYSICIAN:  [facility]   CHIEF COMPLAINT:  Chest pain.   HISTORY OF PRESENT ILLNESS:  This is a 65 year old Spanish-speaking only  female who was brought to the emergency department by her family,  secondary to complaints of severe substernal chest pain that through  translation she describes as being a 9/10 in intensity, sharp and heavy  in the middle of her chest.  She reports this pain started about 5:00  p.m. in the evening and lasted about 3 minutes and was associated with  shortness of breath, nausea, headache and dizziness.  She does report  having previous similar episodes.  Her family reports when the pain  started, she checked her blood pressure and it was elevated and she took  her blood pressure medication, and they checked her blood pressure  later, and it was even higher, then they decided to bring her to the  emergency department for further evaluation.  The patient does have a  history of migraine headaches, as well.   PAST MEDICAL HISTORY:  Significant for hypertension, migraine headaches,  hyperlipidemia, gastroesophageal reflux disease, depression.   PAST SURGICAL HISTORY:  Significant for a total abdominal hysterectomy  and history of a right salpingo-oophorectomy, along with an  appendectomy.   MEDICATIONS:  Include:  1. Lisinopril/HCTZ 20/25 one p.o. daily.  2. Crestor 10 mg one p.o. daily.  3. Zetia 10 mg one p.o. daily.  4. Aspirin 325 mg one p.o. daily.  5. Amitriptyline 50 mg one p.o. q.h.s.  6. Prozac, dose unknown at this time.  7. Propranolol 20 mg one p.o. b.i.d.  8. Ibuprofen 800 mg one  p.o. b.i.d. p.r.n. headaches.   ALLERGIES:  TO MORPHINE WHICH CAUSE SWELLING AND REDNESS.   SOCIAL HISTORY:  Patient is a nonsmoker, nondrinker.   FAMILY HISTORY:  Noncontributory.   REVIEW OF SYSTEMS:  Pertinent as mentioned above.   PHYSICAL EXAMINATION FINDINGS:  GENERAL:  This is an obese 65 year old  female, in no acute distress currently.  VITAL SIGNS:  Temperature 91, blood pressure 135/71 heart rate 92,  respirations 20, O2 saturations 96% on room air.  HEENT:  Examination normocephalic, atraumatic.  Pupils equally round,  reactive to light.  Extraocular muscles are intact, funduscopic benign.  Oropharynx is clear.  NECK:  Supple, full range of motion.  No thyromegaly, adenopathy,  jugular venous distention.  CARDIOVASCULAR:  Regular rate and rhythm.  No murmurs, gallops or rubs.  LUNGS:  Clear to auscultation bilaterally.  ABDOMEN:  Positive bowel sounds, soft, nontender, nondistended.  EXTREMITIES:  Without cyanosis, clubbing or edema.  NEUROLOGIC:  The patient is alert and oriented.  There is no facial  asymmetry or drooping.  Cranial nerves are intact.  Motor and sensory  function are intact.   LABORATORY STUDIES:  White blood cell count 11.3, hemoglobin 15.0,  hematocrit 44.1, MCV 94.7, platelets 298, neutrophils 71%, lymphocytes  18%,  sodium 135, potassium 3.9, chloride 107, bicarb 25.4, glucose 110.  Cardiac enzymes:  Myoglobin 41.7, CK-MB less than 1.0, troponin less  than 0.05.   EKG:  Normal sinus rhythm without acute ST-segment changes.  CT of the  head performed, results pending at this time.   ASSESSMENT:  A 65 year old female being admitted with:  1. Atypical chest pain.  2. Hypertension.  3. Hyperlipidemia.  4. Migraine headaches.   PLAN:  The patient will be admitted to telemetry area, and cardiac  enzymes will be performed.  The patient will be placed on nitrates,  oxygen and aspirin therapy, and she will continue on her regular  medications at  this time.  Further workup and evaluation will ensue,  pending results of her studies.      Della Goo, M.D.  Electronically Signed     HJ/MEDQ  D:  06/28/2007  T:  06/28/2007  Job:  [id number]

## 2011-04-03 ENCOUNTER — Emergency Department (HOSPITAL_COMMUNITY)
Admission: EM | Admit: 2011-04-03 | Discharge: 2011-04-03 | Disposition: A | Discharge status: Home / Self Care | Payer: Medicare Other | Attending: Emergency Medicine | Admitting: Emergency Medicine

## 2011-04-03 ENCOUNTER — Emergency Department (HOSPITAL_COMMUNITY): Payer: Medicare Other

## 2011-04-03 DIAGNOSIS — Z79899 Other long term (current) drug therapy: Secondary | ICD-10-CM | POA: Insufficient documentation

## 2011-04-03 DIAGNOSIS — R109 Unspecified abdominal pain: Secondary | ICD-10-CM | POA: Insufficient documentation

## 2011-04-03 DIAGNOSIS — R112 Nausea with vomiting, unspecified: Secondary | ICD-10-CM | POA: Insufficient documentation

## 2011-04-03 DIAGNOSIS — R0789 Other chest pain: Secondary | ICD-10-CM | POA: Insufficient documentation

## 2011-04-03 DIAGNOSIS — I10 Essential (primary) hypertension: Secondary | ICD-10-CM | POA: Insufficient documentation

## 2011-04-03 DIAGNOSIS — R51 Headache: Secondary | ICD-10-CM | POA: Insufficient documentation

## 2011-04-03 DIAGNOSIS — E7800 Pure hypercholesterolemia, unspecified: Secondary | ICD-10-CM | POA: Insufficient documentation

## 2011-04-03 DIAGNOSIS — R05 Cough: Secondary | ICD-10-CM | POA: Insufficient documentation

## 2011-04-03 DIAGNOSIS — L509 Urticaria, unspecified: Secondary | ICD-10-CM | POA: Insufficient documentation

## 2011-04-03 DIAGNOSIS — R42 Dizziness and giddiness: Secondary | ICD-10-CM | POA: Insufficient documentation

## 2011-04-03 DIAGNOSIS — R059 Cough, unspecified: Secondary | ICD-10-CM | POA: Insufficient documentation

## 2011-04-03 DIAGNOSIS — R197 Diarrhea, unspecified: Secondary | ICD-10-CM | POA: Insufficient documentation

## 2011-04-03 DIAGNOSIS — J40 Bronchitis, not specified as acute or chronic: Secondary | ICD-10-CM

## 2011-04-03 HISTORY — DX: Disorder of thyroid, unspecified: E07.9

## 2011-04-03 HISTORY — DX: Unspecified osteoarthritis, unspecified site: M19.90

## 2011-04-03 HISTORY — DX: Essential (primary) hypertension: I10

## 2011-04-03 HISTORY — DX: Pure hypercholesterolemia, unspecified: E78.00

## 2011-04-03 LAB — COMPREHENSIVE METABOLIC PANEL
CO2: 31 mEq/L (ref 19–32)
Calcium: 9.8 mg/dL (ref 8.4–10.5)
Chloride: 101 mEq/L (ref 96–112)
Creatinine, Ser: 0.54 mg/dL (ref 0.50–1.10)
GFR calc Af Amer: >60 mL/min (ref >60)
GFR calc non Af Amer: >60 mL/min (ref >60)
Glucose, Bld: 98 mg/dL (ref 70–99)
Total Bilirubin: 0.4 mg/dL (ref 0.3–1.2)

## 2011-04-03 LAB — URINALYSIS, ROUTINE W REFLEX MICROSCOPIC
Bilirubin Urine: NEGATIVE
Nitrite: NEGATIVE
Protein, ur: NEGATIVE mg/dL
Specific Gravity, Urine: >1.03 — ABNORMAL HIGH (ref 1.005–1.030)
Urobilinogen, UA: 0.2 mg/dL (ref 0.0–1.0)

## 2011-04-03 LAB — CBC
HCT: 44.1 % (ref 36.0–46.0)
Hemoglobin: 14.8 g/dL (ref 12.0–15.0)
MCH: 31.4 pg (ref 26.0–34.0)
MCV: 93.6 fL (ref 78.0–100.0)
RBC: 4.71 MIL/uL (ref 3.87–5.11)

## 2011-04-03 MED ORDER — DIPHENHYDRAMINE HCL 50 MG/ML IJ SOLN
50.0000 mg | Freq: Once | INTRAMUSCULAR | Status: AC
  Administered 2011-04-03: 50 mg via INTRAMUSCULAR

## 2011-04-03 MED ORDER — PREDNISONE 20 MG PO TABS
60.0000 mg | ORAL_TABLET | Freq: Once | ORAL | Status: AC
  Administered 2011-04-03: 60 mg via ORAL
  Filled 2011-04-03: qty 3

## 2011-04-03 MED ORDER — METOCLOPRAMIDE HCL 5 MG/ML IJ SOLN
10.0000 mg | Freq: Once | INTRAMUSCULAR | Status: DC
  Filled 2011-04-03: qty 2

## 2011-04-03 MED ORDER — PREDNISONE 10 MG PO TABS: ORAL_TABLET | ORAL | Status: AC

## 2011-04-03 MED ORDER — AZITHROMYCIN 250 MG PO TABS
500.0000 mg | ORAL_TABLET | Freq: Once | ORAL | Status: AC
  Administered 2011-04-03: 500 mg via ORAL
  Filled 2011-04-03: qty 2

## 2011-04-03 MED ORDER — AZITHROMYCIN 250 MG PO TABS: 250.0000 mg | ORAL_TABLET | Freq: Every day | ORAL | Status: AC

## 2011-04-03 MED ORDER — METOCLOPRAMIDE HCL 5 MG/ML IJ SOLN
10.0000 mg | Freq: Once | INTRAMUSCULAR | Status: AC
  Administered 2011-04-03: 10 mg via INTRAMUSCULAR

## 2011-04-03 MED ORDER — DIPHENHYDRAMINE HCL 50 MG/ML IJ SOLN
25.0000 mg | Freq: Once | INTRAMUSCULAR | Status: DC
  Filled 2011-04-03: qty 1

## 2011-04-03 MED ORDER — SODIUM CHLORIDE 0.9 % IV BOLUS (SEPSIS): 1000.0000 mL | Freq: Once | INTRAVENOUS | Status: DC

## 2011-04-03 NOTE — ED Notes (Signed)
Pt states her ha is getting better. NAD noted. Pt resting w/ eyes closed, rise & fall of the chest noted. Family at the bed side.

## 2011-04-03 NOTE — ED Notes (Signed)
Awakened and assisted to restroom to collect urine for u/a--Continues to report much decrease in pain.

## 2011-04-03 NOTE — ED Notes (Signed)
Returned to ED via carrier--Lights down to increase comfort--Husband at bedside.

## 2011-04-03 NOTE — ED Notes (Signed)
After 4 attempts by me and another nurse--unable to obtain IV--veins very superficial, fragile--pt. States it is always difficult to draw blood or obtain IV's on her--Dr. Notified and order changed to Benadryl 50 mg IM and Reglan 10 mg IM--  Given in two sites left hip--Tolerated well.  Pt. Still c/o severe headache pain

## 2011-04-03 NOTE — ED Notes (Signed)
Lights out to increase comfort and ice pack given.

## 2011-04-03 NOTE — ED Notes (Signed)
Sleeping on carrier---Husband at bedside.  Side Rails up

## 2011-04-03 NOTE — ED Provider Notes (Signed)
Scribed for Ward Givens, MD, the patient was seen in room APA03/APA03 . This chart was scribed by Ellie Lunch. This patient's care was started at 15:08 PM.   CSN: [id number] Arrival date & time: 04/03/2011  1:51 PM  Chief Complaint  Patient presents with  . Nausea  . Emesis  . Dizziness  . Abdominal Pain   HPI Kendra [name] is a 65 y.o. female who presents to the Emergency Department complaining of  Unproductive cough and low grade fever sx starting on week ago. Pt c/o associated chills, runny nose, SOB and left chest pain when she coughs. Pt also c/o nausea, general abdominal pain, and diahrrea 3-4 times a day when she eats. Diarrhea is loose and watery. Pt denies vomiting. Pt denies sick contact. Pt has a history of migraines and reports having a migraine now with associated photophobia. PT has an ice pack on her head which she states usually helps her headache. Pt also reports a rash for the past few weeks that itches all over. Denies changing soap or detergent.    Past Medical History  Diagnosis Date  . Hypertension   . Migraine   . Hypercholesterolemia   . Arthritis   . Thyroid disease     Past Surgical History  Procedure Date  . Cholecystectomy   . Thyroid surgery   . Abdominal hysterectomy    MEDICATIONS:  Current Discharge Medication List    CONTINUE these medications which have NOT CHANGED   Details  pantoprazole (PROTONIX) 20 MG tablet Take 20 mg by mouth daily.      propranolol (INDERAL) 20 MG tablet Take 20 mg by mouth 2 (two) times daily.      amitriptyline (ELAVIL) 50 MG tablet Take 50 mg by mouth at bedtime as needed. For sleep     lisinopril-hydrochlorothiazide (PRINZIDE,ZESTORETIC) 10-12.5 MG per tablet Take 1 tablet by mouth daily.      sertraline (ZOLOFT) 50 MG tablet Take 100 mg by mouth at bedtime as needed. For mood       Propranolol Crestor- Rx'd but cannot afford Amitriptyline- ran out Sertraline protonix  ALLERGIES:  Allergies as of  04/03/2011 - Review Complete 04/03/2011  Allergen Reaction Noted  . Morphine  03/07/2007     History reviewed. No pertinent family history.  History  Substance Use Topics  . Smoking status: Never Smoker   . Smokeless tobacco: Not on file  . Alcohol Use: No  Accompanied to ED with Daughter and husband  Review of Systems  Constitutional: Positive for fever and chills.  HENT: Positive for rhinorrhea.   Eyes: Positive for photophobia.  Respiratory: Positive for cough and chest tightness.   Gastrointestinal: Positive for nausea, abdominal pain and diarrhea. Negative for vomiting.  Skin: Positive for rash.  Neurological: Positive for headaches.  All other systems reviewed and are negative.    Physical Exam  BP 121/84  Pulse 75  Temp(Src) 98.5 F (36.9 C) (Oral)  Resp 22  Ht 5\' 5"  (1.651 m)  Wt 217 lb (98.431 kg)  BMI 36.11 kg/m2  SpO2 100%  Physical Exam  Constitutional: She appears well-developed and well-nourished.  HENT:  Head: Normocephalic and atraumatic.  Eyes: Conjunctivae and EOM are normal. Pupils are equal, round, and reactive to light.  Neck: Normal range of motion. Neck supple.  Cardiovascular: Normal rate, regular rhythm and normal heart sounds.   Pulmonary/Chest: Effort normal and breath sounds normal.  Abdominal: Soft. Bowel sounds are normal.  Musculoskeletal: Normal range of motion.  Neurological: She is alert.       Patient has an ice pack on her forehead and appears to have pain with her headache she also appears to have some photophobia. She has not had any focal neurological deficit  Skin: Skin is warm and dry.       Patient has diffuse urticarial type lesions on her arms rarely patients on her legs no swelling of her face.  Psychiatric:       Patient has flat affect   Procedures  OTHER DATA REVIEWED: Nursing notes, vital signs, and past medical records reviewed.   DIAGNOSTIC STUDIES: Oxygen Saturation is 100% on room air, normal by my  interpretation.    LABS / RADIOLOGY:  Results for orders placed during the hospital encounter of 04/03/11  CBC      Component Value Range   WBC 9.2  4.0 - 10.5 (K/uL)   RBC 4.71  3.87 - 5.11 (MIL/uL)   Hemoglobin 14.8  12.0 - 15.0 (g/dL)   HCT 16.1  09.6 - 04.5 (%)   MCV 93.6  78.0 - 100.0 (fL)   MCH 31.4  26.0 - 34.0 (pg)   MCHC 33.6  30.0 - 36.0 (g/dL)   RDW 40.9  81.1 - 91.4 (%)   Platelets 276  150 - 400 (K/uL)  COMPREHENSIVE METABOLIC PANEL      Component Value Range   Sodium 140  135 - 145 (mEq/L)   Potassium 3.8  3.5 - 5.1 (mEq/L)   Chloride 101  96 - 112 (mEq/L)   CO2 31  19 - 32 (mEq/L)   Glucose, Bld 98  70 - 99 (mg/dL)   BUN 15  6 - 23 (mg/dL)   Creatinine, Ser 7.82  0.50 - 1.10 (mg/dL)   Calcium 9.8  8.4 - 95.6 (mg/dL)   Total Protein 7.5  6.0 - 8.3 (g/dL)   Albumin 3.9  3.5 - 5.2 (g/dL)   AST 21  0 - 37 (U/L)   ALT 13  0 - 35 (U/L)   Alkaline Phosphatase 99  39 - 117 (U/L)   Total Bilirubin 0.4  0.3 - 1.2 (mg/dL)   GFR calc non Af Amer >60  >60 (mL/min)   GFR calc Af Amer >60  >60 (mL/min)  URINALYSIS, ROUTINE W REFLEX MICROSCOPIC      Component Value Range   Color, Urine YELLOW  YELLOW    Appearance CLEAR  CLEAR    Specific Gravity, Urine >1.030 (*) 1.005 - 1.030    pH 5.5  5.0 - 8.0    Glucose, UA NEGATIVE  NEGATIVE (mg/dL)   Hgb urine dipstick NEGATIVE  NEGATIVE    Bilirubin Urine NEGATIVE  NEGATIVE    Ketones, ur TRACE (*) NEGATIVE (mg/dL)   Protein, ur NEGATIVE  NEGATIVE (mg/dL)   Urobilinogen, UA 0.2  0.0 - 1.0 (mg/dL)   Nitrite NEGATIVE  NEGATIVE    Leukocytes, UA NEGATIVE  NEGATIVE    Laboratory interpretation all normal  Dg Chest 2 View  04/03/2011  *RADIOLOGY REPORT*  Clinical Data: Cough, fever  CHEST - 2 VIEW  Comparison: 04/20/2010  Findings: Lungs are hyperinflated and clear. No pleural effusion or pneumothorax. The cardiomediastinal contours are within normal limits. Atherosclerotic calcification of the aortic arch The visualized bones  and soft tissues are without significant appreciable abnormality.  Multilevel degenerative changes of the thoracic spine.  Surgical clips right upper quadrant.  IMPRESSION: No acute process identified.  Original Report Authenticated By: Waneta Martins, M.D.  ED COURSE / COORDINATION OF CARE: 15:17- Discussed ordering a Chest xray to rule out pneumonia with PT and family.  19:23- PT recheck. PT reports improvement of HA. EDP discussed xray and lab results with PT and family member. 20:00- PT recheck. Pt states her rash is also better after the benadryl injection.   MDM: 4:32 nurses unable to get IV started, IV meds changed to IM meds.    MEDICATIONS GIVEN IN THE E.D.  Medications  metoCLOPramide (REGLAN) injection 10 mg (not administered)  diphenhydrAMINE (BENADRYL) injection 25 mg (not administered)    DISCHARGE MEDICATIONS: New Prescriptions   AZITHROMYCIN (ZITHROMAX Z-PAK) 250 MG TABLET    Take 1 tablet (250 mg total) by mouth daily.   PREDNISONE (DELTASONE) 10 MG TABLET    Take 3 po QD x 2d starting tomorrow, then 2 po QD x 3d then 1 po QD x 3d    I personally performed the services described in this documentation, which was scribed in my presence. The recorded information has been reviewed and considered. Devoria Albe, MD, Armando Gang        Ward Givens, MD 04/03/11 2019

## 2011-04-03 NOTE — ED Notes (Signed)
Taken to radiology via stretcher.  Stable

## 2011-04-03 NOTE — ED Notes (Signed)
Reports slight easing of headache pain---Lights off and husband at bedside.

## 2011-04-03 NOTE — ED Notes (Signed)
Pt presents with abdominal pain, n/v/d, and headache for 3 weeks per husband. Pt speaks little english. No active vomiting in triage. Per husband pt has been depressed.

## 2011-04-03 NOTE — ED Notes (Signed)
Pt states nausea is better, ha rate the pain a 8/10 at this time.

## 2011-04-03 NOTE — ED Notes (Signed)
C/o "Migraine headache" for the past two days---with nausea and vomiting--similar to previous episodes--onset two days ago--rates pain 8 on 1-10 scale--Photophobic---also rash to left forearm

## 2011-04-19 LAB — CBC
HCT: 41.8
Hemoglobin: 14.1
MCV: 93.9
Platelets: 269
RBC: 4.45
WBC: 8.5

## 2011-04-19 LAB — POCT I-STAT, CHEM 8
BUN: 18
Calcium, Ion: 1.2
Creatinine, Ser: 1
Glucose, Bld: 87
Hemoglobin: 14.6
TCO2: 27

## 2011-04-19 LAB — DIFFERENTIAL
Eosinophils Absolute: 0.4
Eosinophils Relative: 5
Lymphocytes Relative: 17
Lymphs Abs: 1.4
Monocytes Absolute: 0.6
Monocytes Relative: 8

## 2011-04-19 LAB — URINE MICROSCOPIC-ADD ON

## 2011-04-19 LAB — URINALYSIS, ROUTINE W REFLEX MICROSCOPIC
Ketones, ur: NEGATIVE
Protein, ur: NEGATIVE
Urobilinogen, UA: 1

## 2011-04-30 LAB — I-STAT 8, (EC8 V) (CONVERTED LAB)
BUN: 22
Chloride: 107
Glucose, Bld: 110 — ABNORMAL HIGH
HCT: 48 — ABNORMAL HIGH
Hemoglobin: 16.3 — ABNORMAL HIGH
Operator id: 294511
Sodium: 135

## 2011-04-30 LAB — CBC
HCT: 40.5
Hemoglobin: 15
MCHC: 34
MCHC: 34.8
MCV: 93.2
Platelets: 298
Platelets: 331
RDW: 13.8
RDW: 13.9

## 2011-04-30 LAB — CK TOTAL AND CKMB (NOT AT ARMC)
CK, MB: 0.7
Relative Index: INVALID
Total CK: 69

## 2011-04-30 LAB — BASIC METABOLIC PANEL
BUN: 16
BUN: 19
CO2: 23
Calcium: 10.1
Calcium: 10.5
Chloride: 100
Creatinine, Ser: 0.79
GFR calc non Af Amer: >60
GFR calc non Af Amer: >60
Glucose, Bld: 109 — ABNORMAL HIGH
Sodium: 133 — ABNORMAL LOW

## 2011-04-30 LAB — DIFFERENTIAL
Basophils Absolute: 0
Basophils Relative: 0
Eosinophils Relative: 3
Lymphocytes Relative: 18
Monocytes Absolute: 0.9
Neutro Abs: 8 — ABNORMAL HIGH

## 2011-04-30 LAB — LIPID PANEL
Cholesterol: 259 — ABNORMAL HIGH
HDL: 49
LDL Cholesterol: 189 — ABNORMAL HIGH
Triglycerides: 104

## 2011-04-30 LAB — POCT CARDIAC MARKERS
CKMB, poc: <1 — ABNORMAL LOW
Operator id: 294511

## 2011-04-30 LAB — CARDIAC PANEL(CRET KIN+CKTOT+MB+TROPI)
Relative Index: INVALID
Relative Index: INVALID

## 2011-04-30 LAB — HOMOCYSTEINE: Homocysteine: 9.6

## 2011-05-01 LAB — I-STAT 8, (EC8 V) (CONVERTED LAB)
Acid-Base Excess: 5 — ABNORMAL HIGH
Glucose, Bld: 90
Hemoglobin: 17 — ABNORMAL HIGH
Potassium: 5
Sodium: 135
TCO2: 35

## 2011-05-01 LAB — DIFFERENTIAL
Eosinophils Absolute: 0.4
Lymphs Abs: 2
Monocytes Relative: 6
Neutrophils Relative %: 70

## 2011-05-01 LAB — CBC
MCV: 94.3
RBC: 4.74
WBC: 10.1

## 2011-05-01 LAB — POCT I-STAT CREATININE
Creatinine, Ser: 0.8
Operator id: 294501

## 2011-05-08 ENCOUNTER — Encounter (HOSPITAL_COMMUNITY): Payer: Self-pay | Admitting: Radiology

## 2011-05-08 ENCOUNTER — Emergency Department (HOSPITAL_COMMUNITY)
Admission: EM | Admit: 2011-05-08 | Discharge: 2011-05-08 | Disposition: A | Discharge status: Home / Self Care | Payer: Medicare Other | Attending: Emergency Medicine | Admitting: Emergency Medicine

## 2011-05-08 ENCOUNTER — Emergency Department (HOSPITAL_COMMUNITY): Payer: Medicare Other

## 2011-05-08 DIAGNOSIS — Z79899 Other long term (current) drug therapy: Secondary | ICD-10-CM | POA: Insufficient documentation

## 2011-05-08 DIAGNOSIS — F3289 Other specified depressive episodes: Secondary | ICD-10-CM | POA: Insufficient documentation

## 2011-05-08 DIAGNOSIS — F329 Major depressive disorder, single episode, unspecified: Secondary | ICD-10-CM | POA: Insufficient documentation

## 2011-05-08 DIAGNOSIS — I252 Old myocardial infarction: Secondary | ICD-10-CM | POA: Insufficient documentation

## 2011-05-08 DIAGNOSIS — E7800 Pure hypercholesterolemia, unspecified: Secondary | ICD-10-CM | POA: Insufficient documentation

## 2011-05-08 DIAGNOSIS — I10 Essential (primary) hypertension: Secondary | ICD-10-CM | POA: Insufficient documentation

## 2011-05-08 DIAGNOSIS — Z8673 Personal history of transient ischemic attack (TIA), and cerebral infarction without residual deficits: Secondary | ICD-10-CM | POA: Insufficient documentation

## 2011-05-08 DIAGNOSIS — I2510 Atherosclerotic heart disease of native coronary artery without angina pectoris: Secondary | ICD-10-CM | POA: Insufficient documentation

## 2011-05-08 DIAGNOSIS — Z87442 Personal history of urinary calculi: Secondary | ICD-10-CM | POA: Insufficient documentation

## 2011-05-08 DIAGNOSIS — M545 Low back pain, unspecified: Secondary | ICD-10-CM | POA: Insufficient documentation

## 2011-05-08 DIAGNOSIS — N200 Calculus of kidney: Secondary | ICD-10-CM | POA: Insufficient documentation

## 2011-05-08 DIAGNOSIS — K219 Gastro-esophageal reflux disease without esophagitis: Secondary | ICD-10-CM | POA: Insufficient documentation

## 2011-05-08 LAB — CBC
HCT: 42.4 % (ref 36.0–46.0)
Hemoglobin: 14.5 g/dL (ref 12.0–15.0)
MCH: 32.2 pg (ref 26.0–34.0)
MCHC: 34.2 g/dL (ref 30.0–36.0)

## 2011-05-08 LAB — BASIC METABOLIC PANEL
BUN: 14 mg/dL (ref 6–23)
Calcium: 9.6 mg/dL (ref 8.4–10.5)
GFR calc non Af Amer: >90 mL/min (ref >90)
Glucose, Bld: 88 mg/dL (ref 70–99)

## 2011-05-08 LAB — URINALYSIS, ROUTINE W REFLEX MICROSCOPIC
Bilirubin Urine: NEGATIVE
Ketones, ur: NEGATIVE mg/dL
Nitrite: NEGATIVE
Protein, ur: NEGATIVE mg/dL
Urobilinogen, UA: 0.2 mg/dL (ref 0.0–1.0)

## 2011-06-25 ENCOUNTER — Other Ambulatory Visit: Payer: Self-pay | Admitting: Internal Medicine

## 2011-06-25 DIAGNOSIS — Z1231 Encounter for screening mammogram for malignant neoplasm of breast: Secondary | ICD-10-CM

## 2011-07-30 ENCOUNTER — Other Ambulatory Visit: Payer: Self-pay | Admitting: Internal Medicine

## 2011-07-30 ENCOUNTER — Ambulatory Visit (HOSPITAL_COMMUNITY)
Admission: RE | Admit: 2011-07-30 | Discharge: 2011-07-30 | Disposition: A | Discharge status: Home / Self Care | Payer: Medicare Other | Source: Ambulatory Visit | Attending: Internal Medicine | Admitting: Internal Medicine

## 2011-07-30 DIAGNOSIS — Z1231 Encounter for screening mammogram for malignant neoplasm of breast: Secondary | ICD-10-CM

## 2011-07-30 DIAGNOSIS — N644 Mastodynia: Secondary | ICD-10-CM

## 2011-08-08 ENCOUNTER — Ambulatory Visit
Admission: RE | Admit: 2011-08-08 | Discharge: 2011-08-08 | Disposition: A | Discharge status: Home / Self Care | Payer: Medicare Other | Source: Ambulatory Visit | Attending: Internal Medicine | Admitting: Internal Medicine

## 2011-08-08 DIAGNOSIS — N644 Mastodynia: Secondary | ICD-10-CM

## 2011-09-13 ENCOUNTER — Encounter (HOSPITAL_COMMUNITY): Payer: Self-pay | Admitting: *Deleted

## 2011-09-13 ENCOUNTER — Emergency Department (HOSPITAL_COMMUNITY)
Admission: EM | Admit: 2011-09-13 | Discharge: 2011-09-13 | Disposition: A | Discharge status: Home / Self Care | Payer: Medicare Other | Attending: Emergency Medicine | Admitting: Emergency Medicine

## 2011-09-13 ENCOUNTER — Emergency Department (HOSPITAL_COMMUNITY): Payer: Medicare Other

## 2011-09-13 DIAGNOSIS — I10 Essential (primary) hypertension: Secondary | ICD-10-CM | POA: Insufficient documentation

## 2011-09-13 DIAGNOSIS — M549 Dorsalgia, unspecified: Secondary | ICD-10-CM | POA: Insufficient documentation

## 2011-09-13 DIAGNOSIS — Z8739 Personal history of other diseases of the musculoskeletal system and connective tissue: Secondary | ICD-10-CM | POA: Insufficient documentation

## 2011-09-13 DIAGNOSIS — Z79899 Other long term (current) drug therapy: Secondary | ICD-10-CM | POA: Insufficient documentation

## 2011-09-13 DIAGNOSIS — E7800 Pure hypercholesterolemia, unspecified: Secondary | ICD-10-CM | POA: Insufficient documentation

## 2011-09-13 DIAGNOSIS — R109 Unspecified abdominal pain: Secondary | ICD-10-CM | POA: Insufficient documentation

## 2011-09-13 DIAGNOSIS — E079 Disorder of thyroid, unspecified: Secondary | ICD-10-CM | POA: Insufficient documentation

## 2011-09-13 LAB — URINALYSIS, ROUTINE W REFLEX MICROSCOPIC
Bilirubin Urine: NEGATIVE
Glucose, UA: NEGATIVE mg/dL
Hgb urine dipstick: NEGATIVE
Specific Gravity, Urine: 1.015 (ref 1.005–1.030)
Urobilinogen, UA: 0.2 mg/dL (ref 0.0–1.0)

## 2011-09-13 MED ORDER — DIPHENHYDRAMINE HCL 12.5 MG/5ML PO ELIX
12.5000 mg | ORAL_SOLUTION | Freq: Once | ORAL | Status: AC
  Administered 2011-09-13: 12.5 mg via ORAL
  Filled 2011-09-13: qty 5

## 2011-09-13 MED ORDER — HYDROCODONE-ACETAMINOPHEN 7.5-325 MG PO TABS: 1.0000 | ORAL_TABLET | Freq: Four times a day (QID) | ORAL | Status: AC | PRN

## 2011-09-13 MED ORDER — DEXAMETHASONE 6 MG PO TABS: ORAL_TABLET | ORAL | Status: AC

## 2011-09-13 MED ORDER — ONDANSETRON 8 MG PO TBDP
8.0000 mg | ORAL_TABLET | Freq: Once | ORAL | Status: AC
  Administered 2011-09-13: 8 mg via ORAL
  Filled 2011-09-13: qty 1

## 2011-09-13 MED ORDER — HYDROMORPHONE HCL PF 1 MG/ML IJ SOLN
1.0000 mg | Freq: Once | INTRAMUSCULAR | Status: AC
Start: 2011-09-13 — End: 2011-09-13
  Administered 2011-09-13: 1 mg via INTRAMUSCULAR
  Filled 2011-09-13: qty 1

## 2011-09-13 NOTE — ED Notes (Signed)
Pain upper back for 2 weeks, no injury

## 2011-09-13 NOTE — Discharge Instructions (Signed)
Your CT scan is negative for renal colic. Please use decadron daily with food until all taken. Please use norco for pain. This may cause drowsiness. Please see Dr Eduard Clos for additional evaluation of your back pain.Dolor de espalda en el adulto (Back Pain, Adult) El dolor de cintura es frecuente. Aproximadamente 1 de cada 5 personas lo sufren.La causa rara vez pone en peligro la vida. Con frecuencia mejora luego de algn tiempo.Alrededor de la mitad de las personas que sufren un inicio sbito de dolor de cintura, se sentirn mejor luego de 2 semanas. Aproximadamente 8 de cada 10 se sentirn mejor luego de 6 semanas.  CAUSAS  Algunas causas comunes son:   Distensin de los msculos o ligamentos que sostienen la columna vertebral.   Desgaste (degeneracin) de los discos vertebrales.   Artritis   Traumatismos directos en la espalda.  DIAGNSTICO  La mayor parte de las veces, la causa directa no se conoce.Sin embargo, Chief Technology Officer puede tratarse efectivamente an cuando no se Best boy.Una de las formas ms precisas de asegurar que la causa del dolor no constituye un peligro es responder a las preguntas del mdico acerca de su salud y sus sntomas. Si el mdico necesita ms informacin, podr indicar anlisis de laboratorio o Education officer, environmental un diagnstico por imgenes (radiografas o Health visitor).Sin embargo, aunque las [employer] modificaciones, generalmente no es necesaria la [place].  INSTRUCCIONES PARA EL CUIDADO EN EL HOGAR  En algunas personas, el dolor de espalda vuelve.Como rara vez es peligroso, los pacientes pueden aprender a Interior and spatial designer.   Mantngase activo. Si permanece sentado o de pie mucho tiempo en el mismo lugar, se tensiona la espalda.  No se siente, maneje ni se quede parado en un mismo lugar por ms de 30 minutos. Realice caminatas cortas en superficies planas ni bien el dolor haya cedido. Trate de Copy tiempo que camina .   No se quede  en la cama.Si hace reposo durante ms de 1 o 2 das, puede Estate agent.   No evite los ejercicios ni el trabajo.El cuerpo est hecho para moverse.No es peligroso estar [place], aunque le duela la espalda.La espalda se curar ms rpido si contina sus actividades antes de que el dolor se vaya.   Preste atencin a su cuerpo cuando se incline y se levante. Muchas personas sienten menos molestias cuando levantan objetos si doblan las rodillas, mantienen la carga cerca del cuerpo y evitan torcerse. Generalmente, las posiciones ms cmodas son las que ejercen menos tensin en la espalda en recuperacin.   Encuentre una posicin cmoda para dormir. Utilice un colchn firme y recustese de [place]. Doble ligeramente sus rodillas. Si se recuesta sobre su espalda, coloque una almohada debajo de sus rodillas.   Tome slo medicamentos de venta libre o recetados, segn las indicaciones del mdico. Los medicamentos de venta libre para Primary school teacher y reducir Futures trader, son los que en general ms ayudan.El mdico podr prescribirle relajantes musculares.Estos medicamentos calman el dolor de modo que pueda retornar a sus actividades normales y a Investment banker, operational.   Aplique hielo sobre la zona lesionada.   Ponga el hielo en una bolsa plstica.   Colquese una toalla entre la piel y la bolsa de hielo.   Deje la bolsa de hielo durante 15 a 3 a 4 veces por da, durante los primeros 2  3 das. Luego podr alternar Eusebio Me calor y hielo para reducir Chief Technology Officer y los espasmos.   Consulte a  su mdico si puede tratar de hacer ejercicios para la espalda y recibir un masaje suave. Pueden ser beneficiosos.   Evite sentirse ansioso o estresado.El estrs aumenta la tensin muscular y puede empeorar el dolor de espalda.Es importante reconocer cuando est ansioso o estresado y aprender la forma de controlarlos.El ejercicio es una gran opcin.  SOLICITE ATENCIN MDICA SI:    Siente un dolor que no se alivia con reposo o medicamentos.   El dolor no mejora en 1 semana.   Desarrolla nuevos sntomas.   No se siente bien en general.  SOLICITE ATENCIN MDICA DE INMEDIATO SI:  Siente un dolor que se irradia desde la espalda hacia sus piernas.   Desarrolla nuevos problemas en el intestino o la vejiga.   Siente debilidad o adormecimiento inusual en sus brazos o piernas.   Presenta nuseas o vmitos.   Presenta dolor abdominal.   Se siente desfalleciente.  Document Released: 07/09/2005 Document Revised: 03/21/2011 [facility] Patient Information 2012 [place], [place].

## 2011-09-13 NOTE — ED Provider Notes (Signed)
History     CSN: [id number]  Arrival date & time 09/13/11  1247   None     Chief Complaint  Patient presents with  . Back Pain    (Consider location/radiation/quality/duration/timing/severity/associated sxs/prior treatment) HPI Comments: Pt states this pain is similar to the pain she had with a large kidney stone nearly a year ago.  Patient is a 66 y.o. female presenting with back pain. The history is provided by the patient and the spouse. The history is limited by a language barrier. Language Interpreter Used: Husband translates for the patient.  Back Pain  This is a recurrent problem. The current episode started more than 1 week ago. The problem occurs daily. The problem has been gradually worsening. The pain is associated with no known injury. Pain location: Left flank and thoracic area. The quality of the pain is described as aching. The pain is severe. The symptoms are aggravated by bending, twisting and certain positions. The pain is the same all the time. Stiffness is present all day. Pertinent negatives include no chest pain, no fever, no abdominal pain, no bowel incontinence, no perianal numbness, no bladder incontinence, no dysuria and no paresthesias. She has tried analgesics for the symptoms. The treatment provided no relief.    Past Medical History  Diagnosis Date  . Hypertension   . Migraine   . Hypercholesterolemia   . Arthritis   . Thyroid disease   . S/P appy     Past Surgical History  Procedure Date  . Cholecystectomy   . Thyroid surgery   . Abdominal hysterectomy   . Appendectomy     History reviewed. No pertinent family history.  History  Substance Use Topics  . Smoking status: Never Smoker   . Smokeless tobacco: Not on file  . Alcohol Use: No    OB History    Grav Para Term Preterm Abortions TAB SAB Ect Mult Living                  Review of Systems  Constitutional: Negative for fever and activity change.       All ROS Neg except as noted  in HPI  HENT: Negative for nosebleeds and neck pain.   Eyes: Negative for photophobia and discharge.  Respiratory: Negative for cough, shortness of breath and wheezing.   Cardiovascular: Negative for chest pain and palpitations.  Gastrointestinal: Negative for abdominal pain, blood in stool and bowel incontinence.  Genitourinary: Negative for bladder incontinence, dysuria, frequency and hematuria.  Musculoskeletal: Positive for back pain. Negative for arthralgias.  Skin: Negative.   Neurological: Negative for dizziness, seizures, speech difficulty and paresthesias.  Psychiatric/Behavioral: Negative for hallucinations and confusion.    Allergies  Morphine  Home Medications   Current Outpatient Rx  Name Route Sig Dispense Refill  . AMITRIPTYLINE HCL 50 MG PO TABS Oral Take 50 mg by mouth at bedtime as needed. For sleep     . LISINOPRIL-HYDROCHLOROTHIAZIDE 10-12.5 MG PO TABS Oral Take 1 tablet by mouth daily.      Marland Kitchen PANTOPRAZOLE SODIUM 20 MG PO TBEC Oral Take 20 mg by mouth daily.      Marland Kitchen PROPRANOLOL HCL 20 MG PO TABS Oral Take 20 mg by mouth 2 (two) times daily.      . SERTRALINE HCL 50 MG PO TABS Oral Take 100 mg by mouth at bedtime as needed. For mood       BP 116/72  Pulse 86  Temp(Src) 98.2 F (36.8 C) (Oral)  Resp  18  Ht 5\' 5"  (1.651 m)  Wt 213 lb (96.616 kg)  BMI 35.44 kg/m2  SpO2 97%  Physical Exam  Nursing note and vitals reviewed. Constitutional: She is oriented to person, place, and time. She appears well-developed and well-nourished.  Non-toxic appearance.  HENT:  Head: Normocephalic.  Right Ear: Tympanic membrane and external ear normal.  Left Ear: Tympanic membrane and external ear normal.  Eyes: EOM and lids are normal. Pupils are equal, round, and reactive to light.  Neck: Normal range of motion. Neck supple. Carotid bruit is not present.  Cardiovascular: Normal rate, regular rhythm, normal heart sounds, intact distal pulses and normal pulses.     Pulmonary/Chest: Breath sounds normal. No respiratory distress.  Abdominal: Soft. Bowel sounds are normal. There is no tenderness. There is no guarding.  Musculoskeletal: Normal range of motion.  Lymphadenopathy:       Head (right side): No submandibular adenopathy present.       Head (left side): No submandibular adenopathy present.    She has no cervical adenopathy.  Neurological: She is alert and oriented to person, place, and time. She has normal strength. No cranial nerve deficit or sensory deficit.  Skin: Skin is warm and dry.  Psychiatric: She has a normal mood and affect. Her speech is normal.    ED Course  Procedures (including critical care time)  Labs Reviewed - No data to display No results found.   Left back/flank pain   MDM  I have reviewed nursing notes, vital signs, and all appropriate lab and imaging results for this patient. This patient has a history of arthritis. She has been having approximately 2 weeks of back pain particularly on the left side. She states that she has had problems with kidney stones in the past and that this pain feels similar. CT scan reveals stones in the kidney but no obstructing stone in the ureter. The vital signs are stable. And after pain medication the patient was able to ambulate to the bathroom with assistance. The patient is advised to consult Dr. Eduard Clos for additional assessment of the spine and assistance with the ongoing pain. Prescription for Decadron and Norco given to the patient. Instructions given to the patient through her husband who does her interpreting for her.   Results for orders placed during the hospital encounter of 05/08/11  URINALYSIS, ROUTINE W REFLEX MICROSCOPIC      Component Value Range   Color, Urine YELLOW  YELLOW    APPearance CLEAR  CLEAR    Specific Gravity, Urine 1.014  1.005 - 1.030    pH 7.5  5.0 - 8.0    Glucose, UA NEGATIVE  NEGATIVE (mg/dL)   Hgb urine dipstick NEGATIVE  NEGATIVE    Bilirubin  Urine NEGATIVE  NEGATIVE    Ketones, ur NEGATIVE  NEGATIVE (mg/dL)   Protein, ur NEGATIVE  NEGATIVE (mg/dL)   Urobilinogen, UA 0.2  0.0 - 1.0 (mg/dL)   Nitrite NEGATIVE  NEGATIVE    Leukocytes, UA SMALL (*) NEGATIVE   URINE MICROSCOPIC-ADD ON      Component Value Range   Squamous Epithelial / LPF RARE  RARE    WBC, UA 0-2  <3 (WBC/hpf)   Bacteria, UA RARE  RARE   CBC      Component Value Range   WBC 8.2  4.0 - 10.5 (K/uL)   RBC 4.51  3.87 - 5.11 (MIL/uL)   Hemoglobin 14.5  12.0 - 15.0 (g/dL)   HCT 44.0  10.2 - 72.5 (%)  MCV 94.0  78.0 - 100.0 (fL)   MCH 32.2  26.0 - 34.0 (pg)   MCHC 34.2  30.0 - 36.0 (g/dL)   RDW 21.3  08.6 - 57.8 (%)   Platelets 268  150 - 400 (K/uL)  BASIC METABOLIC PANEL      Component Value Range   Sodium 136  135 - 145 (mEq/L)   Potassium 3.6  3.5 - 5.1 (mEq/L)   Chloride 97  96 - 112 (mEq/L)   CO2 28  19 - 32 (mEq/L)   Glucose, Bld 88  70 - 99 (mg/dL)   BUN 14  6 - 23 (mg/dL)   Creatinine, Ser 4.69  0.50 - 1.10 (mg/dL)   Calcium 9.6  8.4 - 62.9 (mg/dL)   GFR calc non Af Amer >90  >90 (mL/min)   GFR calc Af Amer >90  >90 (mL/min)   Ct Abdomen Pelvis Wo Contrast  09/13/2011  *RADIOLOGY REPORT*  Clinical Data: Back pain and left flank pain.  CT ABDOMEN AND PELVIS WITHOUT CONTRAST  Technique:  Multidetector CT imaging of the abdomen and pelvis was performed following the standard protocol without intravenous contrast.  Comparison: CT scan 05/08/2011.  Findings: The lung bases are clear.  No pleural effusions or pulmonary nodules.  The unenhanced appearance of the liver is unremarkable and stable. Stable calcified lesion in the right hepatic lobe inferiorly and stable cyst in the right hepatic lobe superiorly.  No biliary dilatation.  The gallbladder is surgically absent.  No significant common bile duct dilatation.  The pancreas demonstrates mild stable atrophy.  No inflammatory changes or mass lesion.  The spleen is normal in size.  No focal lesions.  The  adrenal glands and kidneys are stable.  There are small left-sided renal calculi and probable bilateral parapelvic cysts.  No hydronephrosis or hydroureter.  No obstructing ureteral calculi.  No bladder calculi.  The stomach, duodenum, small bowel and colon are grossly normal without oral contrast.  No inflammatory changes or mass lesions.  The aorta is normal in caliber.  Mild to moderate atherosclerotic calcifications but no aneurysm.  No mesenteric or retroperitoneal masses or lymphadenopathy.  The uterus is surgically absent.  The left ovary is still present and appears normal.  No pelvic mass, adenopathy or free pelvic fluid collections.  The bladder appears normal.  Stable calcified densities in the deep pelvis.  No inguinal mass or hernia.  The bony structures are intact.  There is a stable lytic lesion in the left L4 pedicle.  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  IMPRESSION:  1.  Left renal calculi but no obstructing ureteral calculi. 2.  Probable bilateral parapelvic cyst. 3.  Stable hepatic lesions. 4.  Pelvic calcifications, likely postsurgical change. 5. No acute abdominal/pelvic findings.  Original Report Authenticated By: P. Loralie Champagne, M.D.       Kathie Dike, [place] 09/15/11 1640

## 2011-09-13 NOTE — ED Notes (Signed)
Waiting on result of urinalysis to discharge pt.

## 2011-09-16 NOTE — ED Provider Notes (Signed)
Medical screening examination/treatment/procedure(s) were conducted as a shared visit with non-physician practitioner(s) and myself.  I personally evaluated the patient during the encounter  Pt well appearing, no distress Her abdomen was soft on exam Pt/family agreeable with plan  Joya Gaskins, MD 09/16/11 [phone]

## 2012-06-03 ENCOUNTER — Encounter (HOSPITAL_COMMUNITY): Payer: Self-pay | Admitting: Emergency Medicine

## 2012-06-03 ENCOUNTER — Emergency Department (HOSPITAL_COMMUNITY): Payer: Medicare Other

## 2012-06-03 ENCOUNTER — Emergency Department (HOSPITAL_COMMUNITY)
Admission: EM | Admit: 2012-06-03 | Discharge: 2012-06-03 | Disposition: A | Discharge status: Home / Self Care | Payer: Medicare Other | Attending: Emergency Medicine | Admitting: Emergency Medicine

## 2012-06-03 DIAGNOSIS — R112 Nausea with vomiting, unspecified: Secondary | ICD-10-CM | POA: Insufficient documentation

## 2012-06-03 DIAGNOSIS — Z8739 Personal history of other diseases of the musculoskeletal system and connective tissue: Secondary | ICD-10-CM | POA: Insufficient documentation

## 2012-06-03 DIAGNOSIS — F3289 Other specified depressive episodes: Secondary | ICD-10-CM | POA: Insufficient documentation

## 2012-06-03 DIAGNOSIS — D179 Benign lipomatous neoplasm, unspecified: Secondary | ICD-10-CM

## 2012-06-03 DIAGNOSIS — R51 Headache: Secondary | ICD-10-CM | POA: Insufficient documentation

## 2012-06-03 DIAGNOSIS — I10 Essential (primary) hypertension: Secondary | ICD-10-CM | POA: Insufficient documentation

## 2012-06-03 DIAGNOSIS — G43909 Migraine, unspecified, not intractable, without status migrainosus: Secondary | ICD-10-CM | POA: Insufficient documentation

## 2012-06-03 DIAGNOSIS — R42 Dizziness and giddiness: Secondary | ICD-10-CM | POA: Insufficient documentation

## 2012-06-03 DIAGNOSIS — L293 Anogenital pruritus, unspecified: Secondary | ICD-10-CM | POA: Insufficient documentation

## 2012-06-03 DIAGNOSIS — Z79899 Other long term (current) drug therapy: Secondary | ICD-10-CM | POA: Insufficient documentation

## 2012-06-03 DIAGNOSIS — E079 Disorder of thyroid, unspecified: Secondary | ICD-10-CM | POA: Insufficient documentation

## 2012-06-03 DIAGNOSIS — Z9089 Acquired absence of other organs: Secondary | ICD-10-CM | POA: Insufficient documentation

## 2012-06-03 DIAGNOSIS — M546 Pain in thoracic spine: Secondary | ICD-10-CM | POA: Insufficient documentation

## 2012-06-03 DIAGNOSIS — N760 Acute vaginitis: Secondary | ICD-10-CM

## 2012-06-03 DIAGNOSIS — F329 Major depressive disorder, single episode, unspecified: Secondary | ICD-10-CM | POA: Insufficient documentation

## 2012-06-03 DIAGNOSIS — E7800 Pure hypercholesterolemia, unspecified: Secondary | ICD-10-CM | POA: Insufficient documentation

## 2012-06-03 DIAGNOSIS — Z8673 Personal history of transient ischemic attack (TIA), and cerebral infarction without residual deficits: Secondary | ICD-10-CM | POA: Insufficient documentation

## 2012-06-03 HISTORY — DX: Major depressive disorder, single episode, unspecified: F32.9

## 2012-06-03 HISTORY — DX: Depression, unspecified: F32.A

## 2012-06-03 LAB — URINALYSIS, ROUTINE W REFLEX MICROSCOPIC
Bilirubin Urine: NEGATIVE
Glucose, UA: NEGATIVE mg/dL
Ketones, ur: NEGATIVE mg/dL
Nitrite: NEGATIVE
Specific Gravity, Urine: 1.01 (ref 1.005–1.030)
pH: 7 (ref 5.0–8.0)

## 2012-06-03 LAB — CBC WITH DIFFERENTIAL/PLATELET
HCT: 41.2 % (ref 36.0–46.0)
Hemoglobin: 14 g/dL (ref 12.0–15.0)
Lymphocytes Relative: 21 % (ref 12–46)
Monocytes Absolute: 0.5 10*3/uL (ref 0.1–1.0)
Monocytes Relative: 6 % (ref 3–12)
Neutro Abs: 5.5 10*3/uL (ref 1.7–7.7)
Neutrophils Relative %: 67 % (ref 43–77)
RBC: 4.44 MIL/uL (ref 3.87–5.11)
WBC: 8.2 10*3/uL (ref 4.0–10.5)

## 2012-06-03 LAB — URINE MICROSCOPIC-ADD ON

## 2012-06-03 LAB — BASIC METABOLIC PANEL
BUN: 21 mg/dL (ref 6–23)
CO2: 32 mEq/L (ref 19–32)
Chloride: 99 mEq/L (ref 96–112)
Creatinine, Ser: 0.71 mg/dL (ref 0.50–1.10)
Potassium: 4.2 mEq/L (ref 3.5–5.1)

## 2012-06-03 MED ORDER — HYDROCODONE-ACETAMINOPHEN 5-325 MG PO TABS: 1.0000 | ORAL_TABLET | Freq: Four times a day (QID) | ORAL | Status: DC | PRN

## 2012-06-03 MED ORDER — HYDROMORPHONE HCL PF 1 MG/ML IJ SOLN
1.0000 mg | Freq: Once | INTRAMUSCULAR | Status: AC
  Administered 2012-06-03: 1 mg via INTRAMUSCULAR

## 2012-06-03 MED ORDER — SODIUM CHLORIDE 0.9 % IV BOLUS (SEPSIS)
1000.0000 mL | Freq: Once | INTRAVENOUS | Status: AC
  Administered 2012-06-03: 1000 mL via INTRAVENOUS

## 2012-06-03 MED ORDER — ONDANSETRON 8 MG PO TBDP
ORAL_TABLET | ORAL | Status: AC
  Administered 2012-06-03: 8 mg via ORAL
  Filled 2012-06-03: qty 1

## 2012-06-03 MED ORDER — MICONAZOLE NITRATE 4 % VA CREA: TOPICAL_CREAM | VAGINAL | Status: DC

## 2012-06-03 MED ORDER — PROMETHAZINE HCL 25 MG PO TABS: 25.0000 mg | ORAL_TABLET | Freq: Four times a day (QID) | ORAL | Status: DC | PRN

## 2012-06-03 MED ORDER — HYDROMORPHONE HCL PF 1 MG/ML IJ SOLN
INTRAMUSCULAR | Status: AC
  Administered 2012-06-03: 1 mg via INTRAMUSCULAR
  Filled 2012-06-03: qty 1

## 2012-06-03 MED ORDER — ONDANSETRON 8 MG PO TBDP
8.0000 mg | ORAL_TABLET | Freq: Once | ORAL | Status: AC
  Administered 2012-06-03: 8 mg via ORAL

## 2012-06-03 NOTE — ED Notes (Signed)
Pt states is feeling al ittle better. Water given for oral trial.

## 2012-06-03 NOTE — ED Notes (Signed)
In to d/c and pt vomiting. Dizziness worse. edp aware and vo received

## 2012-06-03 NOTE — ED Notes (Signed)
edp aware of ortho statics and vo given. D/c held at this time

## 2012-06-03 NOTE — ED Notes (Addendum)
Pt c/o stabbing r side neck pain x 2 months. States now has radiating down r side of body into lower back and around into abd that started x 1 month. N/dizziness started last night. Pain worse with movement and dizziness worse with movement. Burning and itching to vaginal area x 1 month. Denies vag d/c or bleeding. States pain to rest of body feels like the pain on r side of neck. Denies v/d. Hand grip and leg strength equal. Smile symmetrical. pupills perrla.

## 2012-06-03 NOTE — ED Provider Notes (Signed)
History     CSN: [id number]  Arrival date & time 06/03/12  1154   First MD Initiated Contact with Patient 06/03/12 1305      Chief Complaint  Patient presents with  . Neck Pain    (Consider location/radiation/quality/duration/timing/severity/associated sxs/prior treatment) Patient is a 66 y.o. female presenting with neck pain. The history is provided by the patient (pt complains of upper back pain). No language interpreter was used.  Neck Pain  This is a new problem. The current episode started more than 2 days ago. The problem occurs constantly. The problem has not changed since onset.Associated with: movement. There has been no fever. The pain is present in the generalized neck. The quality of the pain is described as stabbing. Radiates to: upper back. The pain is at a severity of 3/10. The pain is moderate. The symptoms are aggravated by bending. The pain is the same all the time. Pertinent negatives include no photophobia, no chest pain and no headaches.    Past Medical History  Diagnosis Date  . Hypertension   . Migraine   . Hypercholesterolemia   . Arthritis   . Thyroid disease   . S/P appy   . Stroke   . Depression     Past Surgical History  Procedure Date  . Cholecystectomy   . Thyroid surgery   . Abdominal hysterectomy   . Appendectomy   . Thyroid surgery     History reviewed. No pertinent family history.  History  Substance Use Topics  . Smoking status: Never Smoker   . Smokeless tobacco: Not on file  . Alcohol Use: No    OB History    Grav Para Term Preterm Abortions TAB SAB Ect Mult Living                  Review of Systems  Constitutional: Negative for fatigue.  HENT: Positive for neck pain. Negative for congestion, sinus pressure and ear discharge.   Eyes: Negative for photophobia and discharge.  Respiratory: Negative for cough.   Cardiovascular: Negative for chest pain.  Gastrointestinal: Negative for abdominal pain and diarrhea.    Genitourinary: Negative for frequency and hematuria.  Musculoskeletal: Negative for back pain.  Skin: Negative for rash.  Neurological: Negative for seizures and headaches.  Hematological: Negative.   Psychiatric/Behavioral: Negative for hallucinations.    Allergies  Morphine  Home Medications   Current Outpatient Rx  Name  Route  Sig  Dispense  Refill  . AMITRIPTYLINE HCL 50 MG PO TABS   Oral   Take 100 mg by mouth at bedtime as needed. For sleep         . LISINOPRIL 20 MG PO TABS   Oral   Take 20 mg by mouth daily.         Marland Kitchen PANTOPRAZOLE SODIUM 20 MG PO TBEC   Oral   Take 20 mg by mouth daily.           Marland Kitchen PROPRANOLOL HCL 20 MG PO TABS   Oral   Take 20 mg by mouth 2 (two) times daily.           . SERTRALINE HCL 50 MG PO TABS   Oral   Take 100 mg by mouth at bedtime as needed. For mood            BP 121/78  Pulse 86  Temp 98.7 F (37.1 C) (Oral)  Resp 19  Ht 5\' 4"  (1.626 m)  Wt 210 lb (95.255 kg)  BMI 36.05 kg/m2  SpO2 98%  Physical Exam  Constitutional: She is oriented to person, place, and time. She appears well-developed.  HENT:  Head: Normocephalic and atraumatic.  Eyes: Conjunctivae normal and EOM are normal. No scleral icterus.  Neck: Neck supple. No thyromegaly present.  Cardiovascular: Normal rate and regular rhythm.  Exam reveals no gallop and no friction rub.   No murmur heard. Pulmonary/Chest: No stridor. She has no wheezes. She has no rales. She exhibits no tenderness.  Abdominal: She exhibits no distension. There is no tenderness. There is no rebound.  Musculoskeletal: Normal range of motion.       Tender and swelling upper thoracic spine  Lymphadenopathy:    She has no cervical adenopathy.  Neurological: She is oriented to person, place, and time. Coordination normal.  Skin: No rash noted. No erythema.  Psychiatric: She has a normal mood and affect. Her behavior is normal.    ED Course  Procedures (including critical care  time)  Labs Reviewed  CBC WITH DIFFERENTIAL - Abnormal; Notable for the following:    Eosinophils Relative 6 (*)     All other components within normal limits  BASIC METABOLIC PANEL - Abnormal; Notable for the following:    Glucose, Bld 108 (*)     GFR calc non Af Amer 87 (*)     All other components within normal limits  URINALYSIS, ROUTINE W REFLEX MICROSCOPIC - Abnormal; Notable for the following:    Leukocytes, UA TRACE (*)     All other components within normal limits  URINE MICROSCOPIC-ADD ON   Mr Thoracic Spine Wo Contrast  06/03/2012  *RADIOLOGY REPORT*  Clinical Data: 66 year old female upper back pain radiating to both shoulders.  Palpable mass at the base of the neck.  MRI THORACIC SPINE WITHOUT CONTRAST  Technique:  Multiplanar and multiecho pulse sequences of the thoracic spine were obtained without intravenous contrast.  Comparison: Chest radiograph 04/03/2011 and earlier. CT abdomen pelvis 09/13/2011, 05/08/2011 and earlier.  Findings: Limited sagittal imaging of the cervical spine is grossly normal.  The palpable abnormality at the posterior base of the neck was marked with skin markers and corresponds to a simple appearing lipoma in the midline encompassing roughly 39 x 44 x 95 mm (AP by transverse by CC) and located superficial to the muscular fascia. No paraspinal soft tissue edema or other abnormality identified.  There are trace bilateral pleural effusions.  There is a 10 mm T2 hyperintense lesion in the posterior right dome of the liver which is unchanged and appears benign.  Trace nonspecific left pararenal stranding and fluid.  Thyromegaly, no discrete thyroid mass or nodule.  Thoracic vertebral height, alignment, and marrow signal within normal limits.  Incidental benign vertebral hemangioma at T11. No acute osseous abnormality identified.  Patent and somewhat capacious thoracic spinal canal.  No upper or mid thoracic spinal or foraminal stenosis.  At T11-T12 there is a  small to moderate broad-based central disc protrusion which narrows the ventral CSF space, but results in no significant spinal or foraminal stenosis.  At T12-L1 there is more subtle right paracentral annular tear and tiny protrusion.  No spinal cord signal abnormality.  Conus medullaris appears normal at T12-L1.  IMPRESSION: 1.  No acute osseous abnormality in the thoracic spine.  No significant upper or mid thoracic degenerative changes.  Disc protrusions at T11-T12 and T12-L1 without significant spinal stenosis. 2.  Lipoma at the base of the neck corresponds to the palpable area of concern. 3.  Trace pleural effusions.   Original Report Authenticated By: Erskine Speed, M.D.      No diagnosis found.    MDM          Benny Lennert, MD 06/03/12 1550

## 2012-09-10 ENCOUNTER — Emergency Department (HOSPITAL_COMMUNITY)
Admission: EM | Admit: 2012-09-10 | Discharge: 2012-09-11 | Disposition: A | Discharge status: Home / Self Care | Payer: Medicare Other | Attending: Emergency Medicine | Admitting: Emergency Medicine

## 2012-09-10 ENCOUNTER — Encounter (HOSPITAL_COMMUNITY): Payer: Self-pay

## 2012-09-10 DIAGNOSIS — F329 Major depressive disorder, single episode, unspecified: Secondary | ICD-10-CM | POA: Insufficient documentation

## 2012-09-10 DIAGNOSIS — I10 Essential (primary) hypertension: Secondary | ICD-10-CM | POA: Insufficient documentation

## 2012-09-10 DIAGNOSIS — R55 Syncope and collapse: Secondary | ICD-10-CM | POA: Insufficient documentation

## 2012-09-10 DIAGNOSIS — Z8639 Personal history of other endocrine, nutritional and metabolic disease: Secondary | ICD-10-CM | POA: Insufficient documentation

## 2012-09-10 DIAGNOSIS — F411 Generalized anxiety disorder: Secondary | ICD-10-CM | POA: Insufficient documentation

## 2012-09-10 DIAGNOSIS — R209 Unspecified disturbances of skin sensation: Secondary | ICD-10-CM | POA: Insufficient documentation

## 2012-09-10 DIAGNOSIS — R002 Palpitations: Secondary | ICD-10-CM | POA: Insufficient documentation

## 2012-09-10 DIAGNOSIS — Z79899 Other long term (current) drug therapy: Secondary | ICD-10-CM | POA: Insufficient documentation

## 2012-09-10 DIAGNOSIS — F3289 Other specified depressive episodes: Secondary | ICD-10-CM | POA: Insufficient documentation

## 2012-09-10 DIAGNOSIS — Z8739 Personal history of other diseases of the musculoskeletal system and connective tissue: Secondary | ICD-10-CM | POA: Insufficient documentation

## 2012-09-10 DIAGNOSIS — H53149 Visual discomfort, unspecified: Secondary | ICD-10-CM | POA: Insufficient documentation

## 2012-09-10 DIAGNOSIS — R0789 Other chest pain: Secondary | ICD-10-CM | POA: Insufficient documentation

## 2012-09-10 DIAGNOSIS — R0602 Shortness of breath: Secondary | ICD-10-CM | POA: Insufficient documentation

## 2012-09-10 DIAGNOSIS — Z862 Personal history of diseases of the blood and blood-forming organs and certain disorders involving the immune mechanism: Secondary | ICD-10-CM | POA: Insufficient documentation

## 2012-09-10 DIAGNOSIS — Z8673 Personal history of transient ischemic attack (TIA), and cerebral infarction without residual deficits: Secondary | ICD-10-CM | POA: Insufficient documentation

## 2012-09-10 DIAGNOSIS — Z8679 Personal history of other diseases of the circulatory system: Secondary | ICD-10-CM | POA: Insufficient documentation

## 2012-09-10 DIAGNOSIS — R42 Dizziness and giddiness: Secondary | ICD-10-CM | POA: Insufficient documentation

## 2012-09-10 DIAGNOSIS — E7800 Pure hypercholesterolemia, unspecified: Secondary | ICD-10-CM | POA: Insufficient documentation

## 2012-09-10 DIAGNOSIS — R51 Headache: Secondary | ICD-10-CM | POA: Insufficient documentation

## 2012-09-10 NOTE — ED Notes (Signed)
Per Pt's husband, pt has a hx of anxiety attacks and this episode was accompanied by dizziness and chest pain. Pain is replicated on palpation. Pt states she felt her heart racing and she got scared.

## 2012-09-10 NOTE — ED Notes (Signed)
Pt was at church tonight when per bystanders, pt had a near syncopal episode.

## 2012-09-10 NOTE — ED Notes (Signed)
Pt ambulated to bathroom by family, instructed to provide urine for sample if needed

## 2012-09-11 ENCOUNTER — Emergency Department (HOSPITAL_COMMUNITY): Payer: Medicare Other

## 2012-09-11 LAB — CBC WITH DIFFERENTIAL/PLATELET
Basophils Absolute: 0 10*3/uL (ref 0.0–0.1)
Basophils Relative: 0 % (ref 0–1)
HCT: 39.1 % (ref 36.0–46.0)
Lymphocytes Relative: 22 % (ref 12–46)
MCHC: 34 g/dL (ref 30.0–36.0)
Monocytes Absolute: 0.6 10*3/uL (ref 0.1–1.0)
Neutro Abs: 6.4 10*3/uL (ref 1.7–7.7)
Neutrophils Relative %: 68 % (ref 43–77)
RDW: 14 % (ref 11.5–15.5)
WBC: 9.4 10*3/uL (ref 4.0–10.5)

## 2012-09-11 LAB — BASIC METABOLIC PANEL
CO2: 27 mEq/L (ref 19–32)
Chloride: 100 mEq/L (ref 96–112)
GFR calc Af Amer: >90 mL/min (ref >90)
Potassium: 3.3 mEq/L — ABNORMAL LOW (ref 3.5–5.1)
Sodium: 137 mEq/L (ref 135–145)

## 2012-09-11 LAB — URINALYSIS, ROUTINE W REFLEX MICROSCOPIC
Glucose, UA: NEGATIVE mg/dL
Ketones, ur: NEGATIVE mg/dL
Nitrite: NEGATIVE
Specific Gravity, Urine: <1.005 — ABNORMAL LOW (ref 1.005–1.030)
pH: 7 (ref 5.0–8.0)

## 2012-09-11 LAB — URINE MICROSCOPIC-ADD ON

## 2012-09-11 LAB — TROPONIN I: Troponin I: <0.3 ng/mL (ref <0.30)

## 2012-09-11 MED ORDER — ASPIRIN 81 MG PO CHEW
324.0000 mg | CHEWABLE_TABLET | Freq: Once | ORAL | Status: AC
  Administered 2012-09-11: 324 mg via ORAL
  Filled 2012-09-11: qty 4

## 2012-09-11 MED ORDER — DIAZEPAM 5 MG PO TABS
5.0000 mg | ORAL_TABLET | Freq: Once | ORAL | Status: AC
  Administered 2012-09-11: 5 mg via ORAL
  Filled 2012-09-11: qty 1

## 2012-09-11 MED ORDER — DIAZEPAM 5 MG PO TABS: 5.0000 mg | ORAL_TABLET | Freq: Two times a day (BID) | ORAL | Status: DC

## 2012-09-11 MED ORDER — MECLIZINE HCL 12.5 MG PO TABS
25.0000 mg | ORAL_TABLET | Freq: Once | ORAL | Status: AC
  Administered 2012-09-11: 25 mg via ORAL
  Filled 2012-09-11: qty 2

## 2012-09-11 NOTE — ED Provider Notes (Signed)
I personally performed the services described in this documentation, which was scribed in my presence. The recorded information has been reviewed and is accurate.  Nicoletta Dress. Colon Branch, MD 09/11/12 1610

## 2012-09-11 NOTE — ED Provider Notes (Signed)
History     CSN: [id number]  Arrival date & time 09/10/12  2213   First MD Initiated Contact with Patient 09/10/12 2344      Chief Complaint  Patient presents with  . Near Syncope    (Consider location/radiation/quality/duration/timing/severity/associated sxs/prior treatment) HPI Comments: Kendra [name] is a 67 y.o. Female presenting with a near syncopal event.  She reports she was sitting at church when she developed chest palpitations,  Shortness of breath and numbness in her left arm and her bilateral feet.  She became very anxious and had an episode of near syncope without fall.  She remembers everything about the event.  She has a history of migraine headache and does have a posterior occipital headache as well as photophobia since arrival here.  She denies abdominal pain, nausea or vomiting.  She has a history of anxiety and panic disorder which was exacerbated by the death of her daughter 2 years ago in an mvc.  Husband states she was last seen for similar symptoms at [facility] last summer at which time it was her symtpoms were due to stress.  She has panic attacks mosts days in which her feet and hands get numb, she feels "choked" and has palpitations. The attacks around the 15th of each month are the worst, according to prior records at [facility] as this is the anniversary of her daughters death.   She sometimes passes out during an attack and does not remember them.       The history is provided by the patient and the spouse. The history is limited by a language barrier. A language interpreter was used (Husband and son at bedside to help with interpretation.).    Past Medical History  Diagnosis Date  . Hypertension   . Migraine   . Hypercholesterolemia   . Arthritis   . Thyroid disease   . S/P appy   . Stroke   . Depression     Past Surgical History  Procedure Laterality Date  . Cholecystectomy    . Thyroid surgery    . Abdominal hysterectomy    . Appendectomy    .  Thyroid surgery      No family history on file.  History  Substance Use Topics  . Smoking status: Never Smoker   . Smokeless tobacco: Not on file  . Alcohol Use: No    OB History   Grav Para Term Preterm Abortions TAB SAB Ect Mult Living                  Review of Systems  Constitutional: Negative for fever and chills.  HENT: Negative for congestion, sore throat and neck pain.   Eyes: Negative.   Respiratory: Positive for chest tightness and shortness of breath.   Cardiovascular: Positive for chest pain.  Gastrointestinal: Negative for nausea, vomiting and abdominal pain.  Genitourinary: Negative.   Musculoskeletal: Negative for joint swelling and arthralgias.  Skin: Negative.  Negative for rash and wound.  Neurological: Positive for light-headedness, numbness and headaches. Negative for dizziness and weakness.  Psychiatric/Behavioral: Negative.     Allergies  Morphine  Home Medications   Current Outpatient Rx  Name  Route  Sig  Dispense  Refill  . amitriptyline (ELAVIL) 50 MG tablet   Oral   Take 100 mg by mouth at bedtime as needed. For sleep         . lisinopril-hydrochlorothiazide (PRINZIDE,ZESTORETIC) 20-25 MG per tablet   Oral   Take 1 tablet by mouth  at bedtime.         . pantoprazole (PROTONIX) 20 MG tablet   Oral   Take 20 mg by mouth daily.           . pravastatin (PRAVACHOL) 20 MG tablet   Oral   Take 20 mg by mouth at bedtime.         . propranolol (INDERAL) 20 MG tablet   Oral   Take 20 mg by mouth at bedtime.          . sertraline (ZOLOFT) 50 MG tablet   Oral   Take 100 mg by mouth at bedtime. For mood           BP 140/87  Pulse 89  Temp(Src) 98.6 F (37 C) (Oral)  Ht 5\' 5"  (1.651 m)  Wt 213 lb (96.616 kg)  BMI 35.44 kg/m2  SpO2 98%  Physical Exam  Nursing note and vitals reviewed. Constitutional: She appears well-developed and well-nourished.  HENT:  Head: Normocephalic and atraumatic.  Eyes: Conjunctivae are  normal.  Neck: Normal range of motion.  Cardiovascular: Normal rate, regular rhythm, normal heart sounds and intact distal pulses.   Pulmonary/Chest: Effort normal and breath sounds normal. She has no wheezes.  Abdominal: Soft. Bowel sounds are normal. There is no tenderness.  Musculoskeletal: Normal range of motion.  Neurological: She is alert.  Skin: Skin is warm and dry.  Psychiatric: She has a normal mood and affect.    ED Course  Procedures (including critical care time)  Labs Reviewed  BASIC METABOLIC PANEL - Abnormal; Notable for the following:    Potassium 3.3 (*)    Glucose, Bld 119 (*)    All other components within normal limits  TROPONIN I  CBC WITH DIFFERENTIAL  URINALYSIS, ROUTINE W REFLEX MICROSCOPIC   Dg Chest Port 1 View  09/11/2012  *RADIOLOGY REPORT*  Clinical Data: Prior chest pain  PORTABLE CHEST - 1 VIEW  Comparison: Prior chest x-ray 04/03/2011  Findings: The lungs are well-aerated and free from pulmonary edema, focal airspace consolidation or pulmonary nodule.  Cardiac and mediastinal contours are within normal limits.  No pneumothorax, or pleural effusion. No acute osseous findings.  IMPRESSION:  No acute cardiopulmonary disease.   Original Report Authenticated By: Malachy Moan, M.D.      1. Near syncope      1:40 AM Attempt to ambulate patient prior to dc home,  She reports the room started to spin as she was walking out the door of her exam room.  Orthostatics ordered.  Meclizine 25 mg,  Valium 5 mg PO given.  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  Pts episode tonight is similar to previous episodes of anxiety/stress reaction.  Labs reviewed,  cxr and ekg unchanged from prior.  Pt discussed with Dr Colon Branch who also saw pt prior to dc home.   Date: 09/11/2012  Rate: 84  Rhythm: normal sinus rhythm  QRS Axis: normal  Intervals: normal   ST/T Wave abnormalities: normal  Conduction Disutrbances:none  Narrative Interpretation:   Old EKG Reviewed: unchanged      Burgess Amor, PA 09/11/12 0147

## 2012-09-11 NOTE — ED Notes (Signed)
Discharge instructions reviewed with pt, questions answered. Pt verbalized understanding.  

## 2012-09-11 NOTE — ED Notes (Signed)
Pt ambulated to doorway, pt felt like room was spinning and could not go any further

## 2012-09-28 ENCOUNTER — Emergency Department (HOSPITAL_COMMUNITY)
Admission: EM | Admit: 2012-09-28 | Discharge: 2012-09-28 | Disposition: A | Discharge status: Home / Self Care | Payer: Medicare Other | Attending: Emergency Medicine | Admitting: Emergency Medicine

## 2012-09-28 ENCOUNTER — Other Ambulatory Visit: Payer: Self-pay

## 2012-09-28 ENCOUNTER — Encounter (HOSPITAL_COMMUNITY): Payer: Self-pay

## 2012-09-28 ENCOUNTER — Emergency Department (HOSPITAL_COMMUNITY): Payer: Medicare Other

## 2012-09-28 DIAGNOSIS — E7800 Pure hypercholesterolemia, unspecified: Secondary | ICD-10-CM | POA: Insufficient documentation

## 2012-09-28 DIAGNOSIS — I10 Essential (primary) hypertension: Secondary | ICD-10-CM | POA: Insufficient documentation

## 2012-09-28 DIAGNOSIS — Z87442 Personal history of urinary calculi: Secondary | ICD-10-CM | POA: Insufficient documentation

## 2012-09-28 DIAGNOSIS — Z8739 Personal history of other diseases of the musculoskeletal system and connective tissue: Secondary | ICD-10-CM | POA: Insufficient documentation

## 2012-09-28 DIAGNOSIS — F419 Anxiety disorder, unspecified: Secondary | ICD-10-CM

## 2012-09-28 DIAGNOSIS — F411 Generalized anxiety disorder: Secondary | ICD-10-CM | POA: Insufficient documentation

## 2012-09-28 DIAGNOSIS — R002 Palpitations: Secondary | ICD-10-CM | POA: Insufficient documentation

## 2012-09-28 DIAGNOSIS — G8929 Other chronic pain: Secondary | ICD-10-CM | POA: Insufficient documentation

## 2012-09-28 DIAGNOSIS — Z8679 Personal history of other diseases of the circulatory system: Secondary | ICD-10-CM | POA: Insufficient documentation

## 2012-09-28 DIAGNOSIS — E079 Disorder of thyroid, unspecified: Secondary | ICD-10-CM | POA: Insufficient documentation

## 2012-09-28 DIAGNOSIS — Z79899 Other long term (current) drug therapy: Secondary | ICD-10-CM | POA: Insufficient documentation

## 2012-09-28 DIAGNOSIS — K219 Gastro-esophageal reflux disease without esophagitis: Secondary | ICD-10-CM | POA: Insufficient documentation

## 2012-09-28 DIAGNOSIS — F3289 Other specified depressive episodes: Secondary | ICD-10-CM | POA: Insufficient documentation

## 2012-09-28 HISTORY — DX: Dorsalgia, unspecified: M54.9

## 2012-09-28 HISTORY — DX: Panic disorder (episodic paroxysmal anxiety): F41.0

## 2012-09-28 HISTORY — DX: Gastro-esophageal reflux disease without esophagitis: K21.9

## 2012-09-28 HISTORY — DX: Calculus of kidney: N20.0

## 2012-09-28 HISTORY — DX: Dissociative and conversion disorder, unspecified: F44.9

## 2012-09-28 HISTORY — DX: Other chronic pain: G89.29

## 2012-09-28 LAB — BASIC METABOLIC PANEL
BUN: 12 mg/dL (ref 6–23)
CO2: 30 mEq/L (ref 19–32)
Chloride: 98 mEq/L (ref 96–112)
Creatinine, Ser: 0.71 mg/dL (ref 0.50–1.10)
Glucose, Bld: 98 mg/dL (ref 70–99)

## 2012-09-28 LAB — CBC WITH DIFFERENTIAL/PLATELET
HCT: 43.1 % (ref 36.0–46.0)
Hemoglobin: 14.6 g/dL (ref 12.0–15.0)
Lymphocytes Relative: 22 % (ref 12–46)
MCHC: 33.9 g/dL (ref 30.0–36.0)
Monocytes Absolute: 0.5 10*3/uL (ref 0.1–1.0)
Monocytes Relative: 6 % (ref 3–12)
Neutro Abs: 5.2 10*3/uL (ref 1.7–7.7)

## 2012-09-28 LAB — URINE MICROSCOPIC-ADD ON

## 2012-09-28 LAB — URINALYSIS, ROUTINE W REFLEX MICROSCOPIC
Glucose, UA: NEGATIVE mg/dL
Specific Gravity, Urine: 1.01 (ref 1.005–1.030)

## 2012-09-28 MED ORDER — ONDANSETRON 8 MG PO TBDP
ORAL_TABLET | ORAL | Status: AC
  Administered 2012-09-28: 8 mg via ORAL
  Filled 2012-09-28: qty 1

## 2012-09-28 MED ORDER — ONDANSETRON 8 MG PO TBDP
8.0000 mg | ORAL_TABLET | Freq: Once | ORAL | Status: AC
  Administered 2012-09-28: 8 mg via ORAL

## 2012-09-28 MED ORDER — DIAZEPAM 5 MG PO TABS: 5.0000 mg | ORAL_TABLET | Freq: Two times a day (BID) | ORAL | Status: DC | PRN

## 2012-09-28 MED ORDER — DIAZEPAM 5 MG PO TABS
5.0000 mg | ORAL_TABLET | Freq: Once | ORAL | Status: AC
  Administered 2012-09-28: 5 mg via ORAL
  Filled 2012-09-28: qty 1

## 2012-09-28 MED ORDER — ONDANSETRON 4 MG PO TBDP
ORAL_TABLET | ORAL | Status: AC
  Administered 2012-09-28: 4 mg
  Filled 2012-09-28: qty 1

## 2012-09-28 NOTE — ED Notes (Signed)
Patient took 4 mg odt zofran and reported still feeling dizzy and nauseous. Patient had no vomiting and was not dry heaving. Reported she did not want to stay and wait any longer. Reported she wanted to go home.

## 2012-09-28 NOTE — ED Notes (Signed)
Patient ambulated from bed into wheelchair. Patient began complaining of dizziness and nausea and started dry heaving. Patient denied chest pain or any pain at this time. Patient reporting to family members she wants to go home. Dr. Clarene Duke notified and stated to give patient 4 mg odt zofran.

## 2012-09-28 NOTE — ED Notes (Signed)
Pt returned from xray, states that she is feeling better, pt and family updated

## 2012-09-28 NOTE — ED Notes (Signed)
Pt presents to er with c/o chest pressure, palpitations, sob, nausea that started last night, was seen in er a few weeks ago for same symptoms.

## 2012-09-28 NOTE — ED Notes (Signed)
Pt had limited english, assisted by family members.

## 2012-09-28 NOTE — ED Notes (Signed)
Patient does not need anything at this time. RN at bedside.

## 2012-09-28 NOTE — ED Notes (Signed)
EKG performed at bedside in Room 4; old copy pulled from MUSE and taken along with new EKG and handed to Dr Deretha Emory.

## 2012-09-28 NOTE — ED Provider Notes (Signed)
History     CSN: [id number]  Arrival date & time 09/28/12  Dec 14, 1617   First MD Initiated Contact with Patient 09/28/12 1650      Chief Complaint  Patient presents with  . Shortness of Breath     HPI Pt was seen at 1655.   Per pt, her husband and family, c/o gradual onset and persistence of constant "palpitations" for the past 2 weeks, worse since last night.  Pt pt describes the palpitations as "squeezing and letting go" that lasts for seconds each episode as well as constant "pressure."  States the palpitations make her nauseated and SOB with tingling/numbness around her lips.  Pt has been eval previously for same symptoms at [facility] ED and this ED, most recently as 2 weeks ago.  Pt has significant hx of daily panic attacks which worsen on the 15th of each month, that began after her daughter's death in 12-14-2009.  Denies CP, no cough, no back pain, no abd pain, no N/V/D, no fevers, no dysarthria, no facial droop, no other areas of tingling/numbness, no focal motor weakness.     Past Medical History  Diagnosis Date  . Hypertension   . Migraine   . Hypercholesterolemia   . Arthritis   . Thyroid disease   . Depression   . Panic attacks     daily, worse the 15th of each month  . 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 Kidney stone   . GERD (gastroesophageal reflux disease)   . Chronic back pain     Past Surgical History  Procedure Laterality Date  . Cholecystectomy    . Abdominal hysterectomy    . Appendectomy    . Thyroid surgery       History  Substance Use Topics  . Smoking status: Never Smoker   . Smokeless tobacco: Not on file  . Alcohol Use: No      Review of Systems ROS: Statement: All systems negative except as marked or noted in the HPI; Constitutional: Negative for fever and chills. ; ; Eyes: Negative for eye pain, redness and discharge. ; ; ENMT: Negative for ear pain, hoarseness, nasal congestion, sinus pressure and sore throat. ;  ; Cardiovascular: +palpitations, SOB. Negative for diaphoresis, and peripheral edema. ; ; Respiratory: Negative for cough, wheezing and stridor. ; ; Gastrointestinal: +nausea. Negative for vomiting, diarrhea, abdominal pain, blood in stool, hematemesis, jaundice and rectal bleeding. . ; ; Genitourinary: Negative for dysuria, flank pain and hematuria. ; ; Musculoskeletal: Negative for neck pain. Negative for swelling and trauma.; ; Skin: Negative for pruritus, rash, abrasions, blisters, bruising and skin lesion.; ; Neuro: +paresthesias. Negative for headache, lightheadedness and neck stiffness. Negative for weakness, altered level of consciousness , altered mental status, extremity weakness, involuntary movement, seizure and syncope.       Allergies  Morphine  Home Medications   Current Outpatient Rx  Name  Route  Sig  Dispense  Refill  . amitriptyline (ELAVIL) 50 MG tablet   Oral   Take 100 mg by mouth at bedtime as needed. For sleep         . diazepam (VALIUM) 5 MG tablet   Oral   Take 1 tablet (5 mg total) by mouth 2 (two) times daily.   10 tablet   0   . lisinopril-hydrochlorothiazide (PRINZIDE,ZESTORETIC) 20-25 MG per tablet   Oral   Take 1 tablet by mouth at bedtime.         Marland Kitchen  pantoprazole (PROTONIX) 20 MG tablet   Oral   Take 20 mg by mouth daily.           . pravastatin (PRAVACHOL) 20 MG tablet   Oral   Take 20 mg by mouth at bedtime.         . propranolol (INDERAL) 20 MG tablet   Oral   Take 20 mg by mouth at bedtime.          . sertraline (ZOLOFT) 50 MG tablet   Oral   Take 100 mg by mouth at bedtime. For mood           BP 164/107  Pulse 70  Temp(Src) 98.1 F (36.7 C) (Oral)  Resp 20  Ht 5\' 5"  (1.651 m)  Wt 213 lb (96.616 kg)  BMI 35.44 kg/m2  SpO2 98%  Physical Exam 1700: Physical examination:  Nursing notes reviewed; Vital signs and O2 SAT reviewed;  Constitutional: Well developed, Well nourished, Well hydrated, In no acute distress;  Head:  Normocephalic, atraumatic; Eyes: EOMI, PERRL, No scleral icterus; ENMT: Mouth and pharynx normal, Mucous membranes moist; Neck: Supple, Full range of motion, No lymphadenopathy; Cardiovascular: Regular rate and rhythm, No murmur, rub, or gallop; Respiratory: Breath sounds clear & equal bilaterally, No rales, rhonchi, wheezes.  Speaking full sentences with ease, Normal respiratory effort/excursion; Chest: Nontender, Movement normal; Abdomen: Soft, Nontender, Nondistended, Normal bowel sounds; Genitourinary: No CVA tenderness; Spine:  No midline CS, TS, LS tenderness.;; Extremities: Pulses normal, No tenderness, No edema, No calf edema or asymmetry.; Neuro: AA&Ox3, Major CN grossly intact. No facial droop. Speech clear. No gross focal motor or sensory deficits in extremities.; Skin: Color normal, Warm, Dry.; Psych:  Anxious.   ED Course  Procedures    MDM  MDM Reviewed: previous chart, nursing note and vitals Reviewed previous: ECG and labs Interpretation: ECG, labs and x-ray    Date: 09/28/2012  Rate: 92  Rhythm: normal sinus rhythm  QRS Axis: normal  Intervals: normal  ST/T Wave abnormalities: normal  Conduction Disutrbances:none  Narrative Interpretation:   Old EKG Reviewed: unchanged; no significant changes from previous EKG dated 09/11/2012.  Results for orders placed during the hospital encounter of 09/28/12  CBC WITH DIFFERENTIAL      Result Value Range   WBC 7.9  4.0 - 10.5 K/uL   RBC 4.61  3.87 - 5.11 MIL/uL   Hemoglobin 14.6  12.0 - 15.0 g/dL   HCT 40.9  81.1 - 91.4 %   MCV 93.5  78.0 - 100.0 fL   MCH 31.7  26.0 - 34.0 pg   MCHC 33.9  30.0 - 36.0 g/dL   RDW 78.2  95.6 - 21.3 %   Platelets 308  150 - 400 K/uL   Neutrophils Relative 66  43 - 77 %   Neutro Abs 5.2  1.7 - 7.7 K/uL   Lymphocytes Relative 22  12 - 46 %   Lymphs Abs 1.8  0.7 - 4.0 K/uL   Monocytes Relative 6  3 - 12 %   Monocytes Absolute 0.5  0.1 - 1.0 K/uL   Eosinophils Relative 5  0 - 5 %    Eosinophils Absolute 0.4  0.0 - 0.7 K/uL   Basophils Relative 1  0 - 1 %   Basophils Absolute 0.0  0.0 - 0.1 K/uL  BASIC METABOLIC PANEL      Result Value Range   Sodium 137  135 - 145 mEq/L   Potassium 4.2  3.5 - 5.1 mEq/L  Chloride 98  96 - 112 mEq/L   CO2 30  19 - 32 mEq/L   Glucose, Bld 98  70 - 99 mg/dL   BUN 12  6 - 23 mg/dL   Creatinine, Ser 1.61  0.50 - 1.10 mg/dL   Calcium 9.9  8.4 - 09.6 mg/dL   GFR calc non Af Amer 87 (*) >90 mL/min   GFR calc Af Amer >90  >90 mL/min  TROPONIN I      Result Value Range   Troponin I <0.30  <0.30 ng/mL  D-DIMER, QUANTITATIVE      Result Value Range   D-Dimer, Quant 0.34  0.00 - 0.48 ug/mL-FEU  URINALYSIS, ROUTINE W REFLEX MICROSCOPIC      Result Value Range   Color, Urine STRAW (*) YELLOW   APPearance CLEAR  CLEAR   Specific Gravity, Urine 1.010  1.005 - 1.030   pH 8.0  5.0 - 8.0   Glucose, UA NEGATIVE  NEGATIVE mg/dL   Hgb urine dipstick TRACE (*) NEGATIVE   Bilirubin Urine NEGATIVE  NEGATIVE   Ketones, ur NEGATIVE  NEGATIVE mg/dL   Protein, ur NEGATIVE  NEGATIVE mg/dL   Urobilinogen, UA 0.2  0.0 - 1.0 mg/dL   Nitrite NEGATIVE  NEGATIVE   Leukocytes, UA NEGATIVE  NEGATIVE  URINE MICROSCOPIC-ADD ON      Result Value Range   Squamous Epithelial / LPF RARE  RARE   WBC, UA 3-6  <3 WBC/hpf   RBC / HPF 0-2  <3 RBC/hpf   Dg Chest 2 View 09/28/2012  *RADIOLOGY REPORT*  Clinical Data: Palpitations  CHEST - 2 VIEW  Comparison: 09/11/2012  Findings: Lungs are clear.  No pleural effusion or pneumothorax.  Cardiomediastinal silhouette is within normal limits.  Degenerative changes of the visualized thoracolumbar spine.  IMPRESSION: No evidence of acute cardiopulmonary disease.   Original Report Authenticated By: Charline Bills, M.D.     2045:  Has tol PO well without N/V.  Doubt PE as cause for symptoms given low risk Wells and negative d-dimer. Doubt ACS as cause for symptoms with constant symptoms since last night, unchanged EKG from  previous and normal troponin.  Pt significantly improved after valium; now sitting up smiling, talking with multiple family members at bedside.  States all symptoms have improved and wants to go home now.  Requesting refill of valium that she had rx on her last ED visit.  Pt strongly encouraged to f/u with PMD and her mental health provider for good continuity of care and control of her chronic symptoms.  Dx and testing d/w pt and family.  Questions answered.  Verb understanding, agreeable to d/c home with outpt f/u.            Laray Anger, DO 09/30/12 2321

## 2012-09-28 NOTE — ED Notes (Signed)
Pt reports sob since last night, cp at times that radiates to left arm, +nausea at times.  Mi in 2002

## 2012-09-28 NOTE — ED Notes (Signed)
Patient given cranberry juice per RN approval.

## 2012-09-30 ENCOUNTER — Encounter (HOSPITAL_COMMUNITY): Payer: Self-pay | Admitting: *Deleted

## 2012-09-30 ENCOUNTER — Emergency Department (HOSPITAL_COMMUNITY)
Admission: EM | Admit: 2012-09-30 | Discharge: 2012-09-30 | Disposition: A | Discharge status: Home / Self Care | Payer: Medicare Other | Attending: Emergency Medicine | Admitting: Emergency Medicine

## 2012-09-30 DIAGNOSIS — K219 Gastro-esophageal reflux disease without esophagitis: Secondary | ICD-10-CM | POA: Insufficient documentation

## 2012-09-30 DIAGNOSIS — Z87442 Personal history of urinary calculi: Secondary | ICD-10-CM | POA: Insufficient documentation

## 2012-09-30 DIAGNOSIS — Z79899 Other long term (current) drug therapy: Secondary | ICD-10-CM | POA: Insufficient documentation

## 2012-09-30 DIAGNOSIS — Z8679 Personal history of other diseases of the circulatory system: Secondary | ICD-10-CM | POA: Insufficient documentation

## 2012-09-30 DIAGNOSIS — H81399 Other peripheral vertigo, unspecified ear: Secondary | ICD-10-CM | POA: Insufficient documentation

## 2012-09-30 DIAGNOSIS — F410 Panic disorder [episodic paroxysmal anxiety] without agoraphobia: Secondary | ICD-10-CM | POA: Insufficient documentation

## 2012-09-30 DIAGNOSIS — F3289 Other specified depressive episodes: Secondary | ICD-10-CM | POA: Insufficient documentation

## 2012-09-30 DIAGNOSIS — Z8659 Personal history of other mental and behavioral disorders: Secondary | ICD-10-CM | POA: Insufficient documentation

## 2012-09-30 DIAGNOSIS — I10 Essential (primary) hypertension: Secondary | ICD-10-CM | POA: Insufficient documentation

## 2012-09-30 DIAGNOSIS — Z8739 Personal history of other diseases of the musculoskeletal system and connective tissue: Secondary | ICD-10-CM | POA: Insufficient documentation

## 2012-09-30 DIAGNOSIS — R002 Palpitations: Secondary | ICD-10-CM | POA: Insufficient documentation

## 2012-09-30 DIAGNOSIS — E7800 Pure hypercholesterolemia, unspecified: Secondary | ICD-10-CM | POA: Insufficient documentation

## 2012-09-30 DIAGNOSIS — R0602 Shortness of breath: Secondary | ICD-10-CM | POA: Insufficient documentation

## 2012-09-30 DIAGNOSIS — E079 Disorder of thyroid, unspecified: Secondary | ICD-10-CM | POA: Insufficient documentation

## 2012-09-30 LAB — CBC WITH DIFFERENTIAL/PLATELET
Basophils Relative: 1 % (ref 0–1)
HCT: 42.1 % (ref 36.0–46.0)
Hemoglobin: 13.8 g/dL (ref 12.0–15.0)
Lymphs Abs: 1.6 10*3/uL (ref 0.7–4.0)
MCH: 31.2 pg (ref 26.0–34.0)
MCHC: 32.8 g/dL (ref 30.0–36.0)
Monocytes Absolute: 0.4 10*3/uL (ref 0.1–1.0)
Monocytes Relative: 6 % (ref 3–12)
Neutro Abs: 4.1 10*3/uL (ref 1.7–7.7)
RBC: 4.42 MIL/uL (ref 3.87–5.11)

## 2012-09-30 LAB — BASIC METABOLIC PANEL
BUN: 15 mg/dL (ref 6–23)
Chloride: 99 mEq/L (ref 96–112)
Creatinine, Ser: 0.69 mg/dL (ref 0.50–1.10)
GFR calc Af Amer: >90 mL/min (ref >90)
Glucose, Bld: 90 mg/dL (ref 70–99)

## 2012-09-30 LAB — URINE CULTURE: Colony Count: 50000

## 2012-09-30 MED ORDER — MECLIZINE HCL 12.5 MG PO TABS
25.0000 mg | ORAL_TABLET | Freq: Once | ORAL | Status: AC
  Administered 2012-09-30: 25 mg via ORAL
  Filled 2012-09-30: qty 2

## 2012-09-30 MED ORDER — MECLIZINE HCL 25 MG PO TABS: 25.0000 mg | ORAL_TABLET | Freq: Three times a day (TID) | ORAL | Status: DC | PRN

## 2012-09-30 NOTE — ED Provider Notes (Signed)
History    This chart was scribed for Kendra Booze, MD by Gerlean Ren, ED Scribe. This patient was seen in room APA11/APA11 and the patient's care was started at 4:04 PM    CSN: [id number]  Arrival date & time 09/30/12  1541   First MD Initiated Contact with Patient 09/30/12 1542      Chief Complaint  Patient presents with  . Dizziness     The history is provided by the spouse and the patient. No language interpreter was used.  Kendra [name] is a 67 y.o. female with h/o HTN who presents to the Emergency Department for an EKG after presenting to Dr. Letitia Neri office today with palpitations with associated room-spinning dizziness and dyspnea.  Pt was here 2 days ago for similar symptoms that have been going on for 2 weeks, during which time they sporadically wax-and-wane.  No obvious worsening or improving factors.  Pt denies any loss of coordination while ambulating.  Pt regularly takes Diazepam.      Past Medical History  Diagnosis Date  . Hypertension   . Migraine   . Hypercholesterolemia   . Arthritis   . Thyroid disease   . Depression   . Panic attacks     daily, worse the 15th of each month  . Conversion disorder     caused by stress of daughter's death in 12/07/2009  . Kidney stone   . GERD (gastroesophageal reflux disease)   . Chronic back pain     Past Surgical History  Procedure Laterality Date  . Cholecystectomy    . Abdominal hysterectomy    . Appendectomy    . Thyroid surgery      No family history on file.  History  Substance Use Topics  . Smoking status: Never Smoker   . Smokeless tobacco: Not on file  . Alcohol Use: No    No OB history provided.   Review of Systems  Respiratory: Positive for shortness of breath.   Cardiovascular: Positive for palpitations. Negative for chest pain.  Gastrointestinal: Negative for nausea.  Neurological: Positive for dizziness.  All other systems reviewed and are negative.    Allergies  Morphine and  Pineapple  Home Medications   Current Outpatient Rx  Name  Route  Sig  Dispense  Refill  . amitriptyline (ELAVIL) 50 MG tablet   Oral   Take 100 mg by mouth at bedtime as needed. For sleep         . diazepam (VALIUM) 5 MG tablet   Oral   Take 1 tablet (5 mg total) by mouth 2 (two) times daily as needed for anxiety.   10 tablet   0   . lisinopril-hydrochlorothiazide (PRINZIDE,ZESTORETIC) 20-25 MG per tablet   Oral   Take 1 tablet by mouth at bedtime.         . pantoprazole (PROTONIX) 20 MG tablet   Oral   Take 20 mg by mouth daily.           . pravastatin (PRAVACHOL) 20 MG tablet   Oral   Take 20 mg by mouth at bedtime.         . propranolol (INDERAL) 20 MG tablet   Oral   Take 20 mg by mouth at bedtime.          . sertraline (ZOLOFT) 50 MG tablet   Oral   Take 100 mg by mouth at bedtime. For mood           BP 132/76  Pulse 80  Temp(Src) 98.4 F (36.9 C) (Oral)  Resp 12  Ht 5\' 5"  (1.651 m)  Wt 215 lb (97.523 kg)  BMI 35.78 kg/m2  SpO2 93%  Physical Exam  Nursing note and vitals reviewed. Constitutional: She is oriented to person, place, and time. She appears well-developed and well-nourished. No distress.  HENT:  Head: Normocephalic and atraumatic.  Eyes: Conjunctivae and EOM are normal. Pupils are equal, round, and reactive to light.  Neck: Normal range of motion. Neck supple. No tracheal deviation present.  Cardiovascular: Normal rate, regular rhythm and normal heart sounds.   No murmur heard. No carotid bruit.  Pulmonary/Chest: Effort normal and breath sounds normal. No respiratory distress. She has no wheezes. She has no rales.  Musculoskeletal: Normal range of motion.  Neurological: She is alert and oriented to person, place, and time.  Dizziness reproduced by head movement.   Skin: Skin is warm and dry.  Psychiatric: She has a normal mood and affect. Her behavior is normal.    ED Course  Procedures (including critical care  time) DIAGNOSTIC STUDIES: Oxygen Saturation is 93% on room air, adequate by my interpretation.    COORDINATION OF CARE: 4:13 PM- Husband informed of clinical course including blood work and EKG.  He understands medical decision-making process and agrees with plan.  Results for orders placed during the hospital encounter of 09/30/12  CBC WITH DIFFERENTIAL      Result Value Range   WBC 6.7  4.0 - 10.5 K/uL   RBC 4.42  3.87 - 5.11 MIL/uL   Hemoglobin 13.8  12.0 - 15.0 g/dL   HCT 95.6  21.3 - 08.6 %   MCV 95.2  78.0 - 100.0 fL   MCH 31.2  26.0 - 34.0 pg   MCHC 32.8  30.0 - 36.0 g/dL   RDW 57.8  46.9 - 62.9 %   Platelets 296  150 - 400 K/uL   Neutrophils Relative 62  43 - 77 %   Neutro Abs 4.1  1.7 - 7.7 K/uL   Lymphocytes Relative 24  12 - 46 %   Lymphs Abs 1.6  0.7 - 4.0 K/uL   Monocytes Relative 6  3 - 12 %   Monocytes Absolute 0.4  0.1 - 1.0 K/uL   Eosinophils Relative 8 (*) 0 - 5 %   Eosinophils Absolute 0.5  0.0 - 0.7 K/uL   Basophils Relative 1  0 - 1 %   Basophils Absolute 0.1  0.0 - 0.1 K/uL  BASIC METABOLIC PANEL      Result Value Range   Sodium 139  135 - 145 mEq/L   Potassium 4.3  3.5 - 5.1 mEq/L   Chloride 99  96 - 112 mEq/L   CO2 33 (*) 19 - 32 mEq/L   Glucose, Bld 90  70 - 99 mg/dL   BUN 15  6 - 23 mg/dL   Creatinine, Ser 5.28  0.50 - 1.10 mg/dL   Calcium 9.6  8.4 - 41.3 mg/dL   GFR calc non Af Amer 87 (*) >90 mL/min   GFR calc Af Amer >90  >90 mL/min  TROPONIN I      Result Value Range   Troponin I <0.30  <0.30 ng/mL     ECG shows normal sinus rhythm with a rate of 74, no ectopy. Normal axis. Normal P wave. Normal QRS. Normal intervals. Normal ST and T waves. Impression: normal ECG. When compared with ECG of 09/28/2012, no significant changes are seen.  1. Peripheral vertigo, unspecified laterality   2. Palpitations       MDM  Dizziness which seems labyrinthine based on symptoms and exam. She'll be given a therapeutic trial of meclizine. Dyspnea of  uncertain cause. Old records are reviewed and she had a rather thorough workup 2 days ago including test for her d-dimer. Subjective palpitations. She claimed to be having palpitations at the time of exam and monitor showed normal rhythm without any ectopy. Screening labs will be repeated and she will be given a therapeutic trial of meclizine.   6:03 PM Reevaluation: She feels much better after meclizine. She is percent no ectopy on her monitor. She will be sent home with prescription for meclizine.  I personally performed the services described in this documentation, which was scribed in my presence. The recorded information has been reviewed and is accurate.           Kendra Booze, MD 09/30/12 [phone]

## 2012-09-30 NOTE — ED Notes (Addendum)
Pt doesn't speak English, per husband pt sent here for EKG per Dr. Letitia Neri orders due to pt with dizziness and palpitations

## 2012-09-30 NOTE — ED Notes (Signed)
Pt reports relief of dizziness.  nad noted.

## 2012-10-01 NOTE — ED Notes (Signed)
+   Urine Chart sent to EDP office for review. 

## 2012-10-09 ENCOUNTER — Other Ambulatory Visit (HOSPITAL_COMMUNITY): Payer: Self-pay

## 2012-10-19 ENCOUNTER — Ambulatory Visit: Payer: Medicare Other | Attending: Internal Medicine | Admitting: Sleep Medicine

## 2012-10-19 DIAGNOSIS — G4731 Primary central sleep apnea: Secondary | ICD-10-CM

## 2012-10-19 DIAGNOSIS — Z6838 Body mass index (BMI) 38.0-38.9, adult: Secondary | ICD-10-CM | POA: Insufficient documentation

## 2012-10-19 DIAGNOSIS — G4733 Obstructive sleep apnea (adult) (pediatric): Secondary | ICD-10-CM | POA: Insufficient documentation

## 2012-11-01 NOTE — Procedures (Signed)
[facility] [name], MD     [email or web address]        NAMEMAUDELL, Kendra [name]               ACCOUNT NO.:  [id number]  MEDICAL RECORD NO.:  [id number]          PATIENT TYPE:  OUT  LOCATION:  SLEEP LAB                     FACILITY:  [facility]  PHYSICIAN:  Kendra [name], M.D. DATE OF BIRTH:  [birth year]  DATE OF STUDY:  10/19/2012                           NOCTURNAL POLYSOMNOGRAM  REFERRING PHYSICIAN:  Tesfaye D. Felecia Shelling, MD  INDICATIONS:  A 67 year old, who presents with hypersomnia and snoring. The study is being done to evaluate for obstructive sleep apnea syndrome.  MEDICATIONS:  Sertraline, amitriptyline, propranolol, lisinopril, pravastatin.  EPWORTH SLEEPINESS SCALE:  1.  BMI 38.  ARCHITECTURAL SUMMARY:  The total recording time is 430 minutes.  Sleep efficiency 68%.  Sleep latency 118 minutes.  REM latency 133 minutes. Stage N1 is 60%, N2 is 51%, N3 is 30%, and REM sleep 14%.  RESPIRATORY SUMMARY:  Baseline oxygen saturation is 95, lowest saturation 74 during REM sleep.  Diagnostic AHI is 28 and RDI 29.  LIMB MOVEMENT SUMMARY:  PLM index 0.  ELECTROCARDIOGRAM SUMMARY:  Average heart rate is 76 with no significant dysrhythmias observed.  IMPRESSION:  Moderate obstructive sleep apnea syndrome.  RECOMMENDATION:  Formal CPAP titration study.  Thanks for this referral.    [name], M.D.    KAD/MEDQ  D:  11/01/2012 10:44:37  T:  11/01/2012 11:35:09  Job:  [id number]

## 2013-02-01 ENCOUNTER — Emergency Department (HOSPITAL_COMMUNITY): Payer: Medicare Other

## 2013-02-01 ENCOUNTER — Emergency Department (HOSPITAL_COMMUNITY)
Admission: EM | Admit: 2013-02-01 | Discharge: 2013-02-02 | Disposition: A | Discharge status: Home / Self Care | Payer: Medicare Other | Attending: Emergency Medicine | Admitting: Emergency Medicine

## 2013-02-01 ENCOUNTER — Encounter (HOSPITAL_COMMUNITY): Payer: Self-pay | Admitting: *Deleted

## 2013-02-01 DIAGNOSIS — E7800 Pure hypercholesterolemia, unspecified: Secondary | ICD-10-CM | POA: Insufficient documentation

## 2013-02-01 DIAGNOSIS — G43909 Migraine, unspecified, not intractable, without status migrainosus: Secondary | ICD-10-CM | POA: Insufficient documentation

## 2013-02-01 DIAGNOSIS — Z8739 Personal history of other diseases of the musculoskeletal system and connective tissue: Secondary | ICD-10-CM | POA: Insufficient documentation

## 2013-02-01 DIAGNOSIS — F3289 Other specified depressive episodes: Secondary | ICD-10-CM | POA: Insufficient documentation

## 2013-02-01 DIAGNOSIS — Z7982 Long term (current) use of aspirin: Secondary | ICD-10-CM | POA: Insufficient documentation

## 2013-02-01 DIAGNOSIS — F410 Panic disorder [episodic paroxysmal anxiety] without agoraphobia: Secondary | ICD-10-CM | POA: Insufficient documentation

## 2013-02-01 DIAGNOSIS — I10 Essential (primary) hypertension: Secondary | ICD-10-CM | POA: Insufficient documentation

## 2013-02-01 DIAGNOSIS — K219 Gastro-esophageal reflux disease without esophagitis: Secondary | ICD-10-CM | POA: Insufficient documentation

## 2013-02-01 DIAGNOSIS — R109 Unspecified abdominal pain: Secondary | ICD-10-CM | POA: Insufficient documentation

## 2013-02-01 DIAGNOSIS — R1032 Left lower quadrant pain: Secondary | ICD-10-CM

## 2013-02-01 DIAGNOSIS — R300 Dysuria: Secondary | ICD-10-CM | POA: Insufficient documentation

## 2013-02-01 DIAGNOSIS — Z8639 Personal history of other endocrine, nutritional and metabolic disease: Secondary | ICD-10-CM | POA: Insufficient documentation

## 2013-02-01 DIAGNOSIS — Z862 Personal history of diseases of the blood and blood-forming organs and certain disorders involving the immune mechanism: Secondary | ICD-10-CM | POA: Insufficient documentation

## 2013-02-01 DIAGNOSIS — N390 Urinary tract infection, site not specified: Secondary | ICD-10-CM | POA: Insufficient documentation

## 2013-02-01 DIAGNOSIS — Z79899 Other long term (current) drug therapy: Secondary | ICD-10-CM | POA: Insufficient documentation

## 2013-02-01 DIAGNOSIS — F329 Major depressive disorder, single episode, unspecified: Secondary | ICD-10-CM | POA: Insufficient documentation

## 2013-02-01 LAB — BASIC METABOLIC PANEL
CO2: 31 mEq/L (ref 19–32)
Calcium: 9.5 mg/dL (ref 8.4–10.5)
Chloride: 97 mEq/L (ref 96–112)
Sodium: 136 mEq/L (ref 135–145)

## 2013-02-01 LAB — CBC WITH DIFFERENTIAL/PLATELET
Eosinophils Relative: 3 % (ref 0–5)
Lymphocytes Relative: 15 % (ref 12–46)
Lymphs Abs: 1.6 10*3/uL (ref 0.7–4.0)
MCV: 92.6 fL (ref 78.0–100.0)
Neutro Abs: 8.2 10*3/uL — ABNORMAL HIGH (ref 1.7–7.7)
Platelets: 260 10*3/uL (ref 150–400)
RBC: 4.35 MIL/uL (ref 3.87–5.11)
WBC: 10.9 10*3/uL — ABNORMAL HIGH (ref 4.0–10.5)

## 2013-02-01 LAB — URINALYSIS, ROUTINE W REFLEX MICROSCOPIC
Bilirubin Urine: NEGATIVE
Nitrite: NEGATIVE
Specific Gravity, Urine: 1.01 (ref 1.005–1.030)
pH: 6 (ref 5.0–8.0)

## 2013-02-01 LAB — URINE MICROSCOPIC-ADD ON

## 2013-02-01 MED ORDER — SODIUM CHLORIDE 0.9 % IV BOLUS (SEPSIS)
500.0000 mL | Freq: Once | INTRAVENOUS | Status: AC
  Administered 2013-02-01: 500 mL via INTRAVENOUS

## 2013-02-01 MED ORDER — ONDANSETRON HCL 4 MG/2ML IJ SOLN
4.0000 mg | Freq: Once | INTRAMUSCULAR | Status: AC
  Administered 2013-02-01: 4 mg via INTRAVENOUS
  Filled 2013-02-01: qty 2

## 2013-02-01 MED ORDER — KETOROLAC TROMETHAMINE 30 MG/ML IJ SOLN
30.0000 mg | Freq: Once | INTRAMUSCULAR | Status: AC
  Administered 2013-02-01: 30 mg via INTRAVENOUS
  Filled 2013-02-01: qty 1

## 2013-02-01 MED ORDER — HYDROMORPHONE HCL PF 1 MG/ML IJ SOLN
1.0000 mg | Freq: Once | INTRAMUSCULAR | Status: AC
  Administered 2013-02-01: 1 mg via INTRAVENOUS
  Filled 2013-02-01: qty 1

## 2013-02-01 NOTE — ED Notes (Signed)
Pt had a cystoscopy on July 10th, ever since pt has had dysuria, pain, and feels like her insides are "coming out."

## 2013-02-01 NOTE — ED Provider Notes (Signed)
History    This chart was scribed for Kendra Hutching, MD by Quintella Reichert, ED scribe.  This patient was seen in room APA03/APA03 and the patient's care was started at 10:07 PM.   CSN: [id number]  Arrival date & time 02/01/13  1952     Chief Complaint  Patient presents with  . Abdominal Pain  . Dysuria    The history is provided by the patient. No language interpreter was used.     HPI Comments: Kendra [name] is a 67 y.o. female with h/o kidney stone, GERD, HTN and hypercholesterolemia who presents to the Emergency Department complaining of constant, severe abdominal pain with accompanying dysuria that began 3 days ago after pt received a cystoscopy to remove a left-sided kidney stone.  Abdominal pain is localized to the left lateral abdomen.  Dysuria is described as burning and "like my insides are coming out."  Pt has attempted to treat pain with Tylenol, with some temporary relief.     Past Medical History  Diagnosis Date  . Hypertension   . Migraine   . Hypercholesterolemia   . Arthritis   . Thyroid disease   . Depression   . Panic attacks     daily, worse the 15th of each month  . Conversion disorder     caused by stress of daughter's death in 12/12/2009  . Kidney stone   . GERD (gastroesophageal reflux disease)   . Chronic back pain     Past Surgical History  Procedure Laterality Date  . Cholecystectomy    . Abdominal hysterectomy    . Appendectomy    . Thyroid surgery    . Cystoscopy      History reviewed. No pertinent family history.   History  Substance Use Topics  . Smoking status: Never Smoker   . Smokeless tobacco: Not on file  . Alcohol Use: No    OB History   Grav Para Term Preterm Abortions TAB SAB Ect Mult Living                  Review of Systems A complete 10 system review of systems was obtained and all systems are negative except as noted in the HPI and PMH.    Allergies  Hydrocodone; Morphine; and Pineapple  Home Medications    Current Outpatient Rx  Name  Route  Sig  Dispense  Refill  . amitriptyline (ELAVIL) 50 MG tablet   Oral   Take 100 mg by mouth at bedtime as needed. For sleep         . aspirin 81 MG tablet   Oral   Take 81 mg by mouth daily.         Marland Kitchen docusate sodium (COLACE) 100 MG capsule   Oral   Take 100 mg by mouth 2 (two) times daily.         . pantoprazole (PROTONIX) 20 MG tablet   Oral   Take 20 mg by mouth daily.           . pravastatin (PRAVACHOL) 20 MG tablet   Oral   Take 20 mg by mouth at bedtime.         . propranolol (INDERAL) 20 MG tablet   Oral   Take 20 mg by mouth at bedtime.          . rosuvastatin (CRESTOR) 20 MG tablet   Oral   Take 20 mg by mouth daily.         Marland Kitchen  sertraline (ZOLOFT) 50 MG tablet   Oral   Take 100 mg by mouth at bedtime. For mood         . tamsulosin (FLOMAX) 0.4 MG CAPS   Oral   Take 0.4 mg by mouth daily.         Marland Kitchen lisinopril-hydrochlorothiazide (PRINZIDE,ZESTORETIC) 20-25 MG per tablet   Oral   Take 1 tablet by mouth at bedtime.          BP 139/92  Pulse 101  Temp(Src) 98.1 F (36.7 C) (Oral)  Resp 15  Ht 5\' 4"  (1.626 m)  Wt 213 lb (96.616 kg)  BMI 36.54 kg/m2  SpO2 98%  Physical Exam  Nursing note and vitals reviewed. Constitutional: She is oriented to person, place, and time. She appears well-developed and well-nourished.  HENT:  Head: Normocephalic and atraumatic.  Eyes: Conjunctivae and EOM are normal. Pupils are equal, round, and reactive to light.  Neck: Normal range of motion. Neck supple.  Cardiovascular: Normal rate, regular rhythm and normal heart sounds.   Pulmonary/Chest: Effort normal and breath sounds normal.  Abdominal: Soft. Bowel sounds are normal. There is tenderness (LLQ).  Musculoskeletal: Normal range of motion.  Neurological: She is alert and oriented to person, place, and time.  Skin: Skin is warm and dry.  Psychiatric: She has a normal mood and affect.    ED Course   Procedures (including critical care time)   DIAGNOSTIC STUDIES: Oxygen Saturation is 98% on room air, normal by my interpretation.    COORDINATION OF CARE: 10:10 PM: Discussed treatment plan which includes pain medication and imaging.  Pt expressed understanding and agreed to plan.    Labs Reviewed  URINALYSIS, ROUTINE W REFLEX MICROSCOPIC - Abnormal; Notable for the following:    Hgb urine dipstick LARGE (*)    Protein, ur 100 (*)    All other components within normal limits  URINE MICROSCOPIC-ADD ON  BASIC METABOLIC PANEL  CBC WITH DIFFERENTIAL    No results found.  No diagnosis found.  MDM  Status post cystoscopy on July 10 at [facility] for removal of a kidney stone on the left side.   Patient now with persistent pain in the left flank and left lower quadrant.   No fever or chills.   CT abdomen pelvis pending.  IV Rocephin. Urine culture.  Follow up by Dr Colon Branch    I personally performed the services described in this documentation, which was scribed in my presence. The recorded information has been reviewed and is accurate.    Kendra Hutching, MD 02/02/13 0001

## 2013-02-02 MED ORDER — CIPROFLOXACIN HCL 500 MG PO TABS: 500.0000 mg | ORAL_TABLET | Freq: Two times a day (BID) | ORAL | Status: DC

## 2013-02-02 MED ORDER — DEXTROSE 5 % IV SOLN
1.0000 g | Freq: Once | INTRAVENOUS | Status: AC
  Administered 2013-02-02: 1 g via INTRAVENOUS
  Filled 2013-02-02: qty 10

## 2013-02-02 NOTE — ED Provider Notes (Signed)
2330 Assumed care/disposition of patient. Patient had a cystoscopy with removal of kidney stone. She has had persistent pain. CT pending.  6578 CT done. Has perinephric stranding and a 6 mm stone in the left kidney. Reviewed results with the patient.  She is more comfortable at the present time. Will begin ciprofloxacin. She has medicines for both pain and nausea at home. Discharged home.  Ct Abdomen Pelvis Wo Contrast  02/02/2013   *RADIOLOGY REPORT*  Clinical Data: Dysuria and left flank pain, status post recent cystoscopy for left-sided renal stone.  CT ABDOMEN AND PELVIS WITHOUT CONTRAST  Technique:  Multidetector CT imaging of the abdomen and pelvis was performed following the standard protocol without intravenous contrast.  Comparison: CT of the abdomen and pelvis performed 09/13/2011  Findings: Minimal bibasilar atelectasis is noted.  Small hepatic lesions are grossly unchanged in appearance, one of which demonstrates central calcification.  The liver is otherwise unremarkable in appearance.  The spleen is within normal limits. There is mild fatty infiltration within the pancreas.  The patient is status post cholecystectomy, with clips noted along the gallbladder fossa.  The adrenal glands are grossly unremarkable in appearance.  There is moderate to severe left-sided hydronephrosis, with associated left-sided perinephric stranding.  However, no distal obstructing stone is identified.  This may reflect postprocedural inflammation or possibly ureteritis and mild pyelonephritis.  There appears to be minimal fluid of high attenuation dependently within the bladder, possibly reflecting debris or trace blood. Soft tissue stranding is noted about the bladder.  Scattered small nonobstructing left renal stones are again seen, measuring up to 6 mm in size.  There is mild right-sided pelvicaliectasis, without evidence of right-sided hydronephrosis; minimal associated nonobstructing calyceal stones are seen.  No  free fluid is identified.  The small bowel is unremarkable in appearance.  The stomach is within normal limits.  No acute vascular abnormalities are seen.  Diffuse calcification is noted along the abdominal aorta and its branches.  The patient is status post appendectomy.  The colon is unremarkable in appearance.  Prominent calcifications at the pelvic cul-de-sac may reflect calcified epiploic appendages.  The bladder is mildly distended; as described above, there is diffuse soft tissue stranding about the bladder, raising question for cystitis.  The prostate remains normal in size.  No inguinal lymphadenopathy is seen.  No acute osseous abnormalities are identified.  IMPRESSION:  1.  Moderate to severe left-sided hydronephrosis, with left-sided perinephric stranding.  However, no distal obstructing stone is seen.  This may reflect postprocedural inflammation or possibly ureteritis and mild pyelonephritis. 2.  Minimal fluid of high attenuation dependently within the bladder, possibly reflecting debris or trace blood.  Soft tissue stranding about the bladder, concerning for cystitis. 3.  Scattered small nonobstructing bilateral renal stones noted. 4.  Diffuse calcification along the abdominal aorta and its branches. 5.  Stable appearance to small hepatic lesions, most likely benign.   Original Report Authenticated By: Tonia Ghent, M.D.    Nicoletta Dress. Colon Branch, MD 02/02/13 [id number]

## 2013-02-03 LAB — URINE CULTURE
Colony Count: NO GROWTH
Culture: NO GROWTH

## 2013-03-17 ENCOUNTER — Emergency Department (HOSPITAL_COMMUNITY)
Admission: EM | Admit: 2013-03-17 | Discharge: 2013-03-17 | Disposition: A | Discharge status: Home / Self Care | Payer: Medicare Other | Attending: Emergency Medicine | Admitting: Emergency Medicine

## 2013-03-17 ENCOUNTER — Encounter (HOSPITAL_COMMUNITY): Payer: Self-pay | Admitting: *Deleted

## 2013-03-17 ENCOUNTER — Emergency Department (HOSPITAL_COMMUNITY): Payer: Medicare Other

## 2013-03-17 DIAGNOSIS — Z862 Personal history of diseases of the blood and blood-forming organs and certain disorders involving the immune mechanism: Secondary | ICD-10-CM | POA: Insufficient documentation

## 2013-03-17 DIAGNOSIS — Z87442 Personal history of urinary calculi: Secondary | ICD-10-CM | POA: Insufficient documentation

## 2013-03-17 DIAGNOSIS — G8929 Other chronic pain: Secondary | ICD-10-CM | POA: Insufficient documentation

## 2013-03-17 DIAGNOSIS — E7800 Pure hypercholesterolemia, unspecified: Secondary | ICD-10-CM | POA: Insufficient documentation

## 2013-03-17 DIAGNOSIS — Z8659 Personal history of other mental and behavioral disorders: Secondary | ICD-10-CM | POA: Insufficient documentation

## 2013-03-17 DIAGNOSIS — F410 Panic disorder [episodic paroxysmal anxiety] without agoraphobia: Secondary | ICD-10-CM | POA: Insufficient documentation

## 2013-03-17 DIAGNOSIS — F3289 Other specified depressive episodes: Secondary | ICD-10-CM | POA: Insufficient documentation

## 2013-03-17 DIAGNOSIS — F329 Major depressive disorder, single episode, unspecified: Secondary | ICD-10-CM | POA: Insufficient documentation

## 2013-03-17 DIAGNOSIS — I10 Essential (primary) hypertension: Secondary | ICD-10-CM | POA: Insufficient documentation

## 2013-03-17 DIAGNOSIS — K219 Gastro-esophageal reflux disease without esophagitis: Secondary | ICD-10-CM | POA: Insufficient documentation

## 2013-03-17 DIAGNOSIS — Z79899 Other long term (current) drug therapy: Secondary | ICD-10-CM | POA: Insufficient documentation

## 2013-03-17 DIAGNOSIS — M1990 Unspecified osteoarthritis, unspecified site: Secondary | ICD-10-CM

## 2013-03-17 DIAGNOSIS — M1710 Unilateral primary osteoarthritis, unspecified knee: Secondary | ICD-10-CM | POA: Insufficient documentation

## 2013-03-17 DIAGNOSIS — Z8639 Personal history of other endocrine, nutritional and metabolic disease: Secondary | ICD-10-CM | POA: Insufficient documentation

## 2013-03-17 DIAGNOSIS — M19079 Primary osteoarthritis, unspecified ankle and foot: Secondary | ICD-10-CM | POA: Insufficient documentation

## 2013-03-17 DIAGNOSIS — G43909 Migraine, unspecified, not intractable, without status migrainosus: Secondary | ICD-10-CM | POA: Insufficient documentation

## 2013-03-17 MED ORDER — TRAMADOL HCL 50 MG PO TABS: 50.0000 mg | ORAL_TABLET | Freq: Four times a day (QID) | ORAL | Status: DC | PRN

## 2013-03-17 MED ORDER — CELECOXIB 100 MG PO CAPS: 100.0000 mg | ORAL_CAPSULE | Freq: Two times a day (BID) | ORAL | Status: DC

## 2013-03-17 NOTE — ED Notes (Signed)
C/o right ankle swelling x 3 days and now with burning, denies injury, pt able to ambulate in triage room

## 2013-03-17 NOTE — ED Provider Notes (Signed)
CSN: [id number]     Arrival date & time 03/17/13  1954 History  This chart was scribed for Benny Lennert, MD by Ronal Fear, ED Scribe. This patient was seen in room APA08/APA08 and the patient's care was started at 9:37 PM.    Chief Complaint  Patient presents with  . Joint Swelling    Patient is a 67 y.o. female presenting with leg pain. The history is provided by a relative and the patient. No language interpreter was used.  Leg Pain Location:  Leg, ankle and knee Time since incident:  3 days Injury: no   Leg location:  L lower leg and R lower leg Knee location:  L knee and R knee Ankle location:  L ankle and R ankle Pain details:    Radiates to:  Does not radiate   Severity:  Moderate   Onset quality:  Gradual   Duration:  3 days   Timing:  Constant   Progression:  Worsening Dislocation: no   Foreign body present:  No foreign bodies Tetanus status:  Unknown Prior injury to area:  No Relieved by:  Nothing Ineffective treatments:  None tried Associated symptoms: swelling   Associated symptoms: no back pain, no fatigue and no numbness    HPI Comments: Kendra [name] is a 67 y.o. female who presents to the Emergency Department complaining of constant, gradually worsening bilateral lower leg swelling onset 3 days ago. Pt does not speak English, and a relative is in the room providing history and translation. She reports associated bilateral knee and ankle pain. Pt was ambulatory in triage. Pt denies injury to her legs. She states that she has not seen her PCP, Dr. Felecia Shelling about this. She denies numbness or weakness in her lower extremities.  PCP: Dr. Avon Gully   Past Medical History  Diagnosis Date  . Hypertension   . Migraine   . Hypercholesterolemia   . Arthritis   . Thyroid disease   . Depression   . Panic attacks     daily, worse the 15th of each month  . Conversion disorder     caused by stress of daughter's death in 12-26-09  . GERD (gastroesophageal reflux  disease)   . Chronic back pain   . Kidney stone    Past Surgical History  Procedure Laterality Date  . Cholecystectomy    . Abdominal hysterectomy    . Appendectomy    . Thyroid surgery    . Cystoscopy     History reviewed. No pertinent family history. History  Substance Use Topics  . Smoking status: Never Smoker   . Smokeless tobacco: Not on file  . Alcohol Use: No   OB History   Grav Para Term Preterm Abortions TAB SAB Ect Mult Living                 Review of Systems  Constitutional: Negative for appetite change and fatigue.  HENT: Negative for congestion, sinus pressure and ear discharge.   Eyes: Negative for discharge.  Respiratory: Negative for cough.   Cardiovascular: Positive for leg swelling. Negative for chest pain.  Gastrointestinal: Negative for abdominal pain and diarrhea.  Genitourinary: Negative for frequency and hematuria.  Musculoskeletal: Positive for joint swelling and arthralgias. Negative for back pain.  Skin: Negative for rash.  Neurological: Negative for seizures and headaches.  Psychiatric/Behavioral: Negative for hallucinations.    Allergies  Hydrocodone; Morphine; and Pineapple  Home Medications   Current Outpatient Rx  Name  Route  Sig  Dispense  Refill  . amitriptyline (ELAVIL) 50 MG tablet   Oral   Take 100 mg by mouth at bedtime as needed. For sleep         . lisinopril-hydrochlorothiazide (PRINZIDE,ZESTORETIC) 20-25 MG per tablet   Oral   Take 1 tablet by mouth at bedtime.         . pantoprazole (PROTONIX) 20 MG tablet   Oral   Take 20 mg by mouth daily.           . pravastatin (PRAVACHOL) 20 MG tablet   Oral   Take 20 mg by mouth at bedtime.         . propranolol (INDERAL) 20 MG tablet   Oral   Take 20 mg by mouth at bedtime.          . sertraline (ZOLOFT) 50 MG tablet   Oral   Take 100 mg by mouth at bedtime. For mood          Triage Vitals: BP 151/77  Pulse 83  Temp(Src) 98.2 F (36.8 C) (Oral)   Resp 18  Ht 5\' 5"  (1.651 m)  Wt 213 lb (96.616 kg)  BMI 35.44 kg/m2  SpO2 100%  Physical Exam  Constitutional: She is oriented to person, place, and time. She appears well-developed.  HENT:  Head: Normocephalic.  Eyes: Conjunctivae are normal.  Neck: No tracheal deviation present.  Cardiovascular:  No murmur heard. Musculoskeletal: Normal range of motion. She exhibits tenderness.  Mild swelling and tenderness to both ankles, and minimal tenderness to both knees  Neurological: She is oriented to person, place, and time.  Skin: Skin is warm.  Psychiatric: She has a normal mood and affect.    ED Course  Procedures (including critical care time)  DIAGNOSTIC STUDIES: Oxygen Saturation is 100% on RA, normal by my interpretation.    COORDINATION OF CARE: 9:54 PM- Pt advised of plan to receive an X-ray of her right ankle and pt agrees.   10:43 PM- Upon recheck xray looks normal and pt will be given medication for pain and inflammation. And advised pt to follow up with PCP pt agrees.   Labs Review Labs Reviewed - No data to display  Imaging Review Dg Ankle Complete Right  03/17/2013   *RADIOLOGY REPORT*  Clinical Data: Swelling and pain  RIGHT ANKLE - COMPLETE 3+ VIEW  Comparison: 07/13/2008  Findings: Ankle mortise intact.  Small calcaneal spurs at the plantar aponeurosis and Achilles tendon insertion.  Negative for fracture, dislocation, or other acute bony abnormality.  Normal mineralization and alignment.  There is swelling around the lateral malleolus.  IMPRESSION:  1.  Lateral swelling without fracture suggesting ligamentous injury. Correlate with point tenderness.  2.  Calcaneal spurs.   Original Report Authenticated By: D. Andria Rhein, MD    MDM  No diagnosis found.    The chart was scribed for me under my direct supervision.  I personally performed the history, physical, and medical decision making and all procedures in the evaluation of this patient.Benny Lennert, MD 03/17/13 2251

## 2013-10-31 ENCOUNTER — Emergency Department (HOSPITAL_COMMUNITY)
Admission: EM | Admit: 2013-10-31 | Discharge: 2013-11-01 | Disposition: A | Discharge status: Home / Self Care | Payer: Medicare (Managed Care) | Attending: Emergency Medicine | Admitting: Emergency Medicine

## 2013-10-31 ENCOUNTER — Encounter (HOSPITAL_COMMUNITY): Payer: Self-pay | Admitting: Emergency Medicine

## 2013-10-31 DIAGNOSIS — I10 Essential (primary) hypertension: Secondary | ICD-10-CM | POA: Insufficient documentation

## 2013-10-31 DIAGNOSIS — R51 Headache: Secondary | ICD-10-CM | POA: Insufficient documentation

## 2013-10-31 DIAGNOSIS — G8929 Other chronic pain: Secondary | ICD-10-CM | POA: Insufficient documentation

## 2013-10-31 DIAGNOSIS — Z79899 Other long term (current) drug therapy: Secondary | ICD-10-CM | POA: Insufficient documentation

## 2013-10-31 DIAGNOSIS — K219 Gastro-esophageal reflux disease without esophagitis: Secondary | ICD-10-CM | POA: Insufficient documentation

## 2013-10-31 DIAGNOSIS — R110 Nausea: Secondary | ICD-10-CM | POA: Insufficient documentation

## 2013-10-31 DIAGNOSIS — H9319 Tinnitus, unspecified ear: Secondary | ICD-10-CM | POA: Insufficient documentation

## 2013-10-31 DIAGNOSIS — Z8739 Personal history of other diseases of the musculoskeletal system and connective tissue: Secondary | ICD-10-CM | POA: Insufficient documentation

## 2013-10-31 DIAGNOSIS — Z8679 Personal history of other diseases of the circulatory system: Secondary | ICD-10-CM | POA: Insufficient documentation

## 2013-10-31 DIAGNOSIS — E7800 Pure hypercholesterolemia, unspecified: Secondary | ICD-10-CM | POA: Insufficient documentation

## 2013-10-31 DIAGNOSIS — R079 Chest pain, unspecified: Secondary | ICD-10-CM | POA: Insufficient documentation

## 2013-10-31 DIAGNOSIS — R42 Dizziness and giddiness: Secondary | ICD-10-CM | POA: Insufficient documentation

## 2013-10-31 DIAGNOSIS — Z87442 Personal history of urinary calculi: Secondary | ICD-10-CM | POA: Insufficient documentation

## 2013-10-31 DIAGNOSIS — F329 Major depressive disorder, single episode, unspecified: Secondary | ICD-10-CM | POA: Insufficient documentation

## 2013-10-31 DIAGNOSIS — F3289 Other specified depressive episodes: Secondary | ICD-10-CM | POA: Insufficient documentation

## 2013-10-31 DIAGNOSIS — R0602 Shortness of breath: Secondary | ICD-10-CM | POA: Insufficient documentation

## 2013-10-31 NOTE — ED Notes (Addendum)
Pt has high BP, family reports even though she takes lisinopril it doesn't come down.  Family reporting last visit with Dr. Legrand Rams was "about 2 months ago."  Also reporting back pain.  History of chronic pain.

## 2013-11-01 LAB — CBC WITH DIFFERENTIAL/PLATELET
BASOS ABS: 0 10*3/uL (ref 0.0–0.1)
Basophils Relative: 0 % (ref 0–1)
Eosinophils Absolute: 0.5 10*3/uL (ref 0.0–0.7)
Eosinophils Relative: 6 % — ABNORMAL HIGH (ref 0–5)
HEMATOCRIT: 42.4 % (ref 36.0–46.0)
HEMOGLOBIN: 14.3 g/dL (ref 12.0–15.0)
LYMPHS ABS: 2 10*3/uL (ref 0.7–4.0)
LYMPHS PCT: 21 % (ref 12–46)
MCH: 31.4 pg (ref 26.0–34.0)
MCHC: 33.7 g/dL (ref 30.0–36.0)
MCV: 93.2 fL (ref 78.0–100.0)
MONO ABS: 0.6 10*3/uL (ref 0.1–1.0)
MONOS PCT: 6 % (ref 3–12)
NEUTROS ABS: 6.2 10*3/uL (ref 1.7–7.7)
Neutrophils Relative %: 67 % (ref 43–77)
Platelets: 295 10*3/uL (ref 150–400)
RBC: 4.55 MIL/uL (ref 3.87–5.11)
RDW: 14.1 % (ref 11.5–15.5)
WBC: 9.3 10*3/uL (ref 4.0–10.5)

## 2013-11-01 LAB — BASIC METABOLIC PANEL
BUN: 16 mg/dL (ref 6–23)
CHLORIDE: 101 meq/L (ref 96–112)
CO2: 27 meq/L (ref 19–32)
CREATININE: 0.57 mg/dL (ref 0.50–1.10)
Calcium: 9.3 mg/dL (ref 8.4–10.5)
GFR calc Af Amer: >90 mL/min (ref >90)
GFR calc non Af Amer: >90 mL/min (ref >90)
GLUCOSE: 139 mg/dL — AB (ref 70–99)
Potassium: 4 mEq/L (ref 3.7–5.3)
Sodium: 140 mEq/L (ref 137–147)

## 2013-11-01 LAB — TROPONIN I

## 2013-11-01 NOTE — Discharge Instructions (Signed)
Monitor your blood pressure at home, and keep a log of it - take that log with you when you see your doctor. Hipertensin arterial (Arterial Hypertension) La hipertensin arterial (presin sangunea elevada) es un trastorno que consiste en la elevacin de la presin dentro de sus vasos sanguneos. La hipertensin, con el correr del tiempo, favorece el riesgo de padecer ictus, infartos o insuficiencias cardacas. Tambin es una de las causas principales de insuficiencia renal (del rin).  CAUSAS  En adultos  Ms del 90% de todos los casos de hipertensin no tienen causas conocidas. Esto se denomina hipertensin esencial o primaria. En el 10% restante de personas con hipertensin, el aumento en la presin arterial es causado por otras enfermedades. Esto se denomina hipertensin secundaria. LAS CAUSAS MS IMPORTANTES DE HIPERTENSIN SECUDARIA SON:  Abuso en el consumo de bebidas alcohlicas.  Apnea del sueo obstructiva.  Hiperaldosteronismo (Sndrome de [place]).  Uso de esteroides.  Insuficiencia renal crnica.  Hiperparatiroidismo.  Medicamentos.  Estenosis de la arteria renal.  Feocromocitoma.  Enfermedad de [place].  Coartacin de la aorta.  Crisis de esclerodermia renal.  Regaliz (en cantidades excesivas).  Drogas (cocana, metanfetamina). El profesional que lo asiste le [employer] puntos mencionados arriba que pueden estar referidos a usted.  En nios  La hipertensin secundaria es la ms comn y siempre debe ser considerada como causa.  Embarazo  Pocas mujeres en edad de procrear tienen alta presin arterial. Sin embargo, hasta el 10% de ellas desarrollan hipertensin inducida por el embarazo. En general, esto no perjudicar a la mujer (benigno). Puede ser un sntoma de 3 complicaciones del embarazo: preeclampsia, sndrome HELLP (por sus siglas en ingls) y eclampsia. Es necesario Chartered certified accountant seguimiento y Building surveyor con medicacin. SNTOMAS  Esta enfermedad  normalmente no produce ningn sntoma perceptible. Generalmente se descubre en un examen de rutina.  La hipertensin maligna (hipertensin acelerada) es un problema (complicacin) tarda de la presin arterial elevada. Puede tener los siguientes sntomas:  Dolor de [place].  Visin borrosa.  Dao en rganos diana (significa que los riones, el corazn, los pulmones y otros rganos se estn daando).  Las situaciones de estrs pueden aumentar la presin arterial. Si una persona con presin arterial normal tiene un aumento en su presin en el momento en que es controlada por el profesional, se denomina "hipertensin de guardapolvo blanco". Su gravedad no se conoce. Puede estar relacionado con un desarrollo futuro de hipertensin, o con complicaciones de la hipertensin.  La hipertensin generalmente se confunde con tensin intelectual, estrs y ansiedad. DIAGNSTICO El diagnstico se realiza mediante 3 mediciones de presin sangunea individuales. Se toman con una diferencia de al menos una semana entre [place]. Si hay un rgano daado por causa de la hipertensin, el diagnstico puede realizarse sin repetir [employer]. La hipertensin normalmente se identifica (diagnostica) si se obtienen los siguientes niveles:  Ms de 140/90 mm Hg medidos en ambos brazos, en 3 mediciones distintas, a lo largo de [employer].  Ms de 130/80 mm Hg debe considerarse un factor de riesgo y puede requerir tratamiento en pacientes con diabetes. La presin arterial de ms de 120/80 mm Hg se denomina "pre-hipertensin", an en pacientes no diabticos. Para obtener una medicin precisa de la presin sangunea, siga las siguientes indicaciones. Sea consciente de los factores que pueden alterar las mediciones de presin sangunea.  Tome la presin al menos una hora luego de consumir cafena.  Tome la presin 30 minutos luego de fumar y sin Psychologist, forensic. Este es otro motivo para  dejar de fumar: aumenta la presin  arterial.  Use un manguito de tamao adecuado. Consulte al profesional que lo asiste si no est seguro acerca del tamao que le corresponde.  La mayor parte de los tensimetros hogareos son automticos. Medirn la presin sistlica y diastlica. La presin sistlica es la que se mide al comienzo de los sonidos. La presin diastlica es la presin en la que los sonidos desaparecen. Si usted es [place], mida distintas presiones en distintas posiciones. Intente sentado, acostado o parado.  Sintese y descanse por al menos 5 minutos antes de [employer].  No debera tomar medicamentos como descongestionantes (estimulantes adrenrgicos) antes de Biomedical scientist. Estos se encuentran en muchos medicamentos para el resfro.  Tome nota de las mediciones de su presin sangunea e infrmeselas al profesional que lo asiste. Si usted tiene hipertensin:  El profesional que lo asiste podr Human resources officer para asegurarse de que no tenga hipertensin secundaria (ver "causas", arriba).  El profesional que lo asiste tambin podr buscar sntomas de Sndrome Met[place]o. Tambin se denomina Sndrome X o Sndrome de Insulinorresistencia. Podr tener este sndrome si, adems de hipertensin, tiene diabetes del tipo 2, obesidad abdominal, y lpidos sanguneos anormales.  El profesional que lo asiste tomar su historia mdica y familiar y Optometrist un examen fsico.  Las pruebas de diagnstico pueden incluir exmenes de sangre (de glucosa, [place], [place], y funcin del rin), un anlisis de [place], o Publishing rights manager. Puede ser necesario realizar otras pruebas segn su caso particular. PREVENCIN Existen algunos importantes consejos relacionados con su estilo de vida que puede adoptar para reducir sus probabilidades de desarrollar hipertensin.  Mantenga un peso normal.  Limite la cantidad de sal (sodio) en su dieta.  Ejerctese regularmente.  Limite el consumo de alcohol.  Lleve una  dieta con suficiente potasio. Converse consejos especficos con el profesional que lo asiste.  Haga un dieta DASH ( dieta que ayuda a frenar la hipertensin por sus siglas en ingls). Esta dieta es rica en frutas, vegetales, y productos lcteos descremados, y evita ciertos tipos de grasas. PRONSTICO: La hipertensin esencial no puede curarse. Los [employer] en el estilo de vida y el tratamiento mdico pueden disminuir la presin sangunea y reducir las complicaciones. El pronstico de la hipertensin secundaria depende de la causa subyacente. Muchas personas controlan su hipertensin con medicamentos o cambios en su estilo de vida y de esta forma pueden vivir una vida normal y saludable.  [place]. Mientras la presin arterial alta en s misma no es una enfermedad, a menudo requiere [employer] a los efectos a Production designer, theatre/television/film y Tree surgeon plazo que tiene sobre muchos rganos. La hipertensin aumenta el riesgo de padecer:  ACV o ictus (accidentes cerebrovasculares)  Insuficiencia cardaca debido a la presin arterial elevada crnica (Cardiomiopata hipertensiva).  Ataques cardacos (infarto del miocardio).  Lesin en la retina (retinopata hipertensiva).  Insuficiencia renal (nefropata hipertensiva). El profesional que lo asiste le [employer] puntos de esta lista que pueden estar referidos a usted. El tratamiento de la hipertensin puede reducir significativamente el riesgo de complicaciones. TRATAMIENTO  Se recomienda la prdida de peso para los pacientes excedidos y la prctica regular de ejercicio. El buen estado fsico reduce la presin arterial.  La hipertensin leve generalmente se trata con dieta y ejercicio. Una dieta rica en frutas y vegetales, lcteos descremados y alimentos bajos en grasas y sal (sodio) pueden ayudar a disminuir la presin arterial. La disminucin del consumo de sal disminuye la presin arterial en un tercio de  las Engineer, manufacturing.  Si es fumador, abandone el  hbito. Estos pasos son muy efectivos para reducir la presin arterial. Estas acciones a seguir son fciles de sugerir pero difciles de llevar a cabo. La mayora de los pacientes con hipertensin moderada o grave finalmente necesita medicamentos para retornar la presin a Development worker, community. Hay varios tipos de medicamentos que se emplean en el tratamiento. Las pastillas para la presin arterial (antihipertensivos) la disminuyen a travs de sus diferentes acciones. La disminucin de la presin arterial en 10 mm Hg puede disminuir el riesgo de complicaciones en un 25 %. El objetivo del tratamiento es el control efectivo de la presin arterial. Esto reducir el riesgo de complicaciones. El profesional que lo asiste le ayudar a Leisure centre manager tratamiento para usted, de acuerdo con su estilo de vida. Lo que puede ser un excelente tratamiento para [name] persona, puede no serlo para [place]. INSTRUCCIONES PARA EL CUIDADO DOMICILIARIO  NO fume.  Adapte su estilo de vida de acuerdo a las indicaciones de la seccin "Prevencin."  Si est tomando medicamentos, siga las indicaciones cuidadosamente. Los medicamentos para la presin arterial deben tomarse segn fueron prescritos. Saltear dosis reduce sus beneficios. Tambin lo pone en riesgo de padecer complicaciones.  Concurra a las consultas de seguimiento con el profesional que lo asiste, segn le haya indicado.  Si se le solicita que controle su presin arterial en su hogar, siga las anteriores indicaciones de la seccin "diagnstico." SOLICITE ATENCIN MDICA SI:  Cree que est sufriendo efectos secundarios de la medicacin.  Siente dolores de [place] recurrentes o se siente mareado.  Siente hinchazn en sus tobillos.  Tiene problemas visuales. SOLICITE ATENCIN MDICA DE INMEDIATO SI:  Comienza sbitamente a sentir dolor o presin en el pecho, dificultad para respirar, u otros sntomas de ataque cardaco.  Sufre una cefalea grave.  Tiene  sntomas de ataque cardaco (debilidad repentina, dificultad para hablar, dificultad para caminar). EST SEGURO QUE:   Comprende las instrucciones para el alta mdica.  Controlar su enfermedad.  Solicitar atencin mdica de inmediato segn las indicaciones. Document Released: 07/09/2005 Document Revised: 10/01/2011 [facility] Patient Information 2014 [place], [place].

## 2013-11-01 NOTE — ED Provider Notes (Signed)
CSN: [id number]     Arrival date & time 10/31/13  10-26-33 History  This chart was scribed for Delora Fuel, MD by Elby Beck, ED Scribe. This patient was seen in room APA04/APA04 and the patient's care was started at 12:02 AM.   Chief Complaint  Patient presents with  . Hypertension     The history is provided by the patient. No language interpreter was used.    HPI Comments: Kendra [name] is a 68 y.o. female with a history of HTN and migraines who presents to the Emergency Department complaining of elevated blood pressures at home over the past 2 weeks. She reports taking her blood pressure today to be 179/107. ED blood pressure is 173/95. She states that she typically checks her BP 3-4 times a day, and that her BP has typically been controlled at 120/80 prior to the past 2 weeks. She reports that she takes Lisinopril as prescribed. She states that she has been having associated frontal headaches and that this pain radiates to her right ear. She rates her headache pain at a severity of 8/10. She also states that she has been having intermittent associated symptoms of dizziness, tinnitus, and nausea, She denies visual disturbances or epistaxis.  PCP- Dr. Rosita Fire   Past Medical History  Diagnosis Date  . Hypertension   . Migraine   . Hypercholesterolemia   . Arthritis   . Thyroid disease   . Depression   . Panic attacks     daily, worse the 15th of each month  . Conversion disorder     caused by stress of daughter's death in 10/26/2009  . GERD (gastroesophageal reflux disease)   . Chronic back pain   . Kidney stone    Past Surgical History  Procedure Laterality Date  . Cholecystectomy    . Abdominal hysterectomy    . Appendectomy    . Thyroid surgery    . Cystoscopy     History reviewed. No pertinent family history. History  Substance Use Topics  . Smoking status: Never Smoker   . Smokeless tobacco: Not on file  . Alcohol Use: No   OB History   Grav Para Term Preterm  Abortions TAB SAB Ect Mult Living                 Review of Systems  HENT: Positive for ear pain and tinnitus. Negative for nosebleeds.   Eyes: Negative for visual disturbance.  Respiratory: Positive for shortness of breath.   Cardiovascular: Positive for chest pain.  Gastrointestinal: Positive for nausea.  Neurological: Positive for dizziness and headaches.  All other systems reviewed and are negative.   Allergies  Hydrocodone; Morphine; and Pineapple  Home Medications   Current Outpatient Rx  Name  Route  Sig  Dispense  Refill  . amitriptyline (ELAVIL) 50 MG tablet   Oral   Take 100 mg by mouth at bedtime as needed. For sleep         . celecoxib (CELEBREX) 100 MG capsule   Oral   Take 1 capsule (100 mg total) by mouth 2 (two) times daily.   30 capsule   0   . lisinopril-hydrochlorothiazide (PRINZIDE,ZESTORETIC) 20-25 MG per tablet   Oral   Take 1 tablet by mouth at bedtime.         . pantoprazole (PROTONIX) 20 MG tablet   Oral   Take 20 mg by mouth daily.           . pravastatin (PRAVACHOL)  20 MG tablet   Oral   Take 20 mg by mouth at bedtime.         . propranolol (INDERAL) 20 MG tablet   Oral   Take 20 mg by mouth at bedtime.          . sertraline (ZOLOFT) 50 MG tablet   Oral   Take 100 mg by mouth at bedtime. For mood         . traMADol (ULTRAM) 50 MG tablet   Oral   Take 1 tablet (50 mg total) by mouth every 6 (six) hours as needed for pain.   20 tablet   0    Triage Vitals: BP 140/93  Pulse 83  Temp(Src) 98.1 F (36.7 C) (Oral)  Resp 20  Ht 5\' 4"  (1.626 m)  Wt 230 lb (104.327 kg)  BMI 39.46 kg/m2  SpO2 94%  Physical Exam  Nursing note and vitals reviewed. Constitutional: She is oriented to person, place, and time. She appears well-developed and well-nourished. No distress.  HENT:  Head: Normocephalic and atraumatic.  Eyes: EOM are normal. Pupils are equal, round, and reactive to light.  Fundi have no hemorrhage, exudate or  papilledema.  Neck: Neck supple. No JVD present. No tracheal deviation present.  Cardiovascular: Normal rate and regular rhythm.   No murmur heard. Pulmonary/Chest: Effort normal. No respiratory distress. She has no wheezes.  Abdominal: Soft. She exhibits no mass. There is no tenderness.  Musculoskeletal: Normal range of motion. She exhibits no edema.  Lymphadenopathy:    She has no cervical adenopathy.  Neurological: She is alert and oriented to person, place, and time. No cranial nerve deficit. Coordination normal.  Skin: Skin is warm and dry. No rash noted.  Psychiatric: She has a normal mood and affect. Her behavior is normal. Thought content normal.    ED Course  Procedures (including critical care time)  DIAGNOSTIC STUDIES: Oxygen Saturation is 94% on RA, adequate by my interpretation.    COORDINATION OF CARE: 12:08 AM- Discussed plan to obtain diagnostic lab work in the ED. Advised pt to keep a record of her BP on an ongoing basis and to present this to her PCP for the best results. Pt advised of plan for treatment and pt agrees.  Labs Review Results for orders placed during the hospital encounter of 10/31/13  CBC WITH DIFFERENTIAL      Result Value Ref Range   WBC 9.3  4.0 - 10.5 K/uL   RBC 4.55  3.87 - 5.11 MIL/uL   Hemoglobin 14.3  12.0 - 15.0 g/dL   HCT 42.4  36.0 - 46.0 %   MCV 93.2  78.0 - 100.0 fL   MCH 31.4  26.0 - 34.0 pg   MCHC 33.7  30.0 - 36.0 g/dL   RDW 14.1  11.5 - 15.5 %   Platelets 295  150 - 400 K/uL   Neutrophils Relative % 67  43 - 77 %   Neutro Abs 6.2  1.7 - 7.7 K/uL   Lymphocytes Relative 21  12 - 46 %   Lymphs Abs 2.0  0.7 - 4.0 K/uL   Monocytes Relative 6  3 - 12 %   Monocytes Absolute 0.6  0.1 - 1.0 K/uL   Eosinophils Relative 6 (*) 0 - 5 %   Eosinophils Absolute 0.5  0.0 - 0.7 K/uL   Basophils Relative 0  0 - 1 %   Basophils Absolute 0.0  0.0 - 0.1 K/uL  BASIC METABOLIC PANEL  Result Value Ref Range   Sodium 140  137 - 147 mEq/L    Potassium 4.0  3.7 - 5.3 mEq/L   Chloride 101  96 - 112 mEq/L   CO2 27  19 - 32 mEq/L   Glucose, Bld 139 (*) 70 - 99 mg/dL   BUN 16  6 - 23 mg/dL   Creatinine, Ser 0.57  0.50 - 1.10 mg/dL   Calcium 9.3  8.4 - 10.5 mg/dL   GFR calc non Af Amer >90  >90 mL/min   GFR calc Af Amer >90  >90 mL/min  TROPONIN I      Result Value Ref Range   Troponin I <0.30  <0.30 ng/mL    EKG Interpretation   Date/Time:  Sunday November 01 2013 00:29:09 EDT Ventricular Rate:  72 PR Interval:  152 QRS Duration: 84 QT Interval:  428 QTC Calculation: 468 R Axis:   42 Text Interpretation:  Normal sinus rhythm Right atrial enlargement  Borderline ECG When compared with ECG of 30-Sep-2012 16:23, No significant  change was found Confirmed by [facility]  MD, Ezabella Teska ([id number]) on 11/01/2013  1:30:12 AM      MDM   Final diagnoses:  Hypertension    Hypertension which has shown some lability. Blood pressure was initially elevated at has come down to only mildly elevated since being put in a room. No evidence of endorgan damage. Patient is advised to maintain a log of her blood pressure and take the log with her that when she sees her PCP. PCP will decide whether to adjust her blood pressure medications. No indication for any medication change or other intervention today.   I personally performed the services described in this documentation, which was scribed in my presence. The recorded information has been reviewed and is accurate.     Delora Fuel, MD 66/06/30 1601

## 2013-11-01 NOTE — ED Notes (Signed)
Female at bedside states that her md advised her to take lisinopril 40mg  po instead of 20mg  po. She was afraid to double her dosage

## 2013-12-08 ENCOUNTER — Emergency Department (HOSPITAL_COMMUNITY)
Admission: EM | Admit: 2013-12-08 | Discharge: 2013-12-08 | Disposition: A | Discharge status: Home / Self Care | Payer: Medicare (Managed Care) | Attending: Emergency Medicine | Admitting: Emergency Medicine

## 2013-12-08 ENCOUNTER — Emergency Department (HOSPITAL_COMMUNITY): Payer: Medicare (Managed Care)

## 2013-12-08 ENCOUNTER — Encounter (HOSPITAL_COMMUNITY): Payer: Self-pay | Admitting: Emergency Medicine

## 2013-12-08 DIAGNOSIS — R1012 Left upper quadrant pain: Secondary | ICD-10-CM | POA: Insufficient documentation

## 2013-12-08 DIAGNOSIS — M549 Dorsalgia, unspecified: Secondary | ICD-10-CM | POA: Insufficient documentation

## 2013-12-08 DIAGNOSIS — R1110 Vomiting, unspecified: Secondary | ICD-10-CM | POA: Insufficient documentation

## 2013-12-08 DIAGNOSIS — R0789 Other chest pain: Secondary | ICD-10-CM

## 2013-12-08 DIAGNOSIS — F329 Major depressive disorder, single episode, unspecified: Secondary | ICD-10-CM | POA: Insufficient documentation

## 2013-12-08 DIAGNOSIS — Z87442 Personal history of urinary calculi: Secondary | ICD-10-CM | POA: Insufficient documentation

## 2013-12-08 DIAGNOSIS — I10 Essential (primary) hypertension: Secondary | ICD-10-CM | POA: Insufficient documentation

## 2013-12-08 DIAGNOSIS — Z90710 Acquired absence of both cervix and uterus: Secondary | ICD-10-CM | POA: Insufficient documentation

## 2013-12-08 DIAGNOSIS — K219 Gastro-esophageal reflux disease without esophagitis: Secondary | ICD-10-CM | POA: Insufficient documentation

## 2013-12-08 DIAGNOSIS — F411 Generalized anxiety disorder: Secondary | ICD-10-CM | POA: Insufficient documentation

## 2013-12-08 DIAGNOSIS — Z8739 Personal history of other diseases of the musculoskeletal system and connective tissue: Secondary | ICD-10-CM | POA: Insufficient documentation

## 2013-12-08 DIAGNOSIS — Z9089 Acquired absence of other organs: Secondary | ICD-10-CM | POA: Insufficient documentation

## 2013-12-08 DIAGNOSIS — G8929 Other chronic pain: Secondary | ICD-10-CM | POA: Insufficient documentation

## 2013-12-08 DIAGNOSIS — Z8679 Personal history of other diseases of the circulatory system: Secondary | ICD-10-CM | POA: Insufficient documentation

## 2013-12-08 DIAGNOSIS — E7800 Pure hypercholesterolemia, unspecified: Secondary | ICD-10-CM | POA: Insufficient documentation

## 2013-12-08 DIAGNOSIS — R071 Chest pain on breathing: Secondary | ICD-10-CM | POA: Insufficient documentation

## 2013-12-08 DIAGNOSIS — R319 Hematuria, unspecified: Secondary | ICD-10-CM | POA: Insufficient documentation

## 2013-12-08 DIAGNOSIS — Z79899 Other long term (current) drug therapy: Secondary | ICD-10-CM | POA: Insufficient documentation

## 2013-12-08 DIAGNOSIS — F3289 Other specified depressive episodes: Secondary | ICD-10-CM | POA: Insufficient documentation

## 2013-12-08 LAB — HEPATIC FUNCTION PANEL
ALT: 11 U/L (ref 0–35)
AST: 17 U/L (ref 0–37)
Albumin: 3.2 g/dL — ABNORMAL LOW (ref 3.5–5.2)
Alkaline Phosphatase: 123 U/L — ABNORMAL HIGH (ref 39–117)
BILIRUBIN TOTAL: 0.4 mg/dL (ref 0.3–1.2)
Total Protein: 6.6 g/dL (ref 6.0–8.3)

## 2013-12-08 LAB — CBC WITH DIFFERENTIAL/PLATELET
Basophils Absolute: 0 10*3/uL (ref 0.0–0.1)
Basophils Relative: 1 % (ref 0–1)
EOS ABS: 0.2 10*3/uL (ref 0.0–0.7)
Eosinophils Relative: 4 % (ref 0–5)
HCT: 41 % (ref 36.0–46.0)
Hemoglobin: 13.7 g/dL (ref 12.0–15.0)
Lymphocytes Relative: 20 % (ref 12–46)
Lymphs Abs: 1.3 10*3/uL (ref 0.7–4.0)
MCH: 31.2 pg (ref 26.0–34.0)
MCHC: 33.4 g/dL (ref 30.0–36.0)
MCV: 93.4 fL (ref 78.0–100.0)
Monocytes Absolute: 0.4 10*3/uL (ref 0.1–1.0)
Monocytes Relative: 7 % (ref 3–12)
Neutro Abs: 4.5 10*3/uL (ref 1.7–7.7)
Neutrophils Relative %: 68 % (ref 43–77)
PLATELETS: 294 10*3/uL (ref 150–400)
RBC: 4.39 MIL/uL (ref 3.87–5.11)
RDW: 14 % (ref 11.5–15.5)
WBC: 6.5 10*3/uL (ref 4.0–10.5)

## 2013-12-08 LAB — URINALYSIS, ROUTINE W REFLEX MICROSCOPIC
Bilirubin Urine: NEGATIVE
Glucose, UA: NEGATIVE mg/dL
Hgb urine dipstick: NEGATIVE
KETONES UR: NEGATIVE mg/dL
LEUKOCYTES UA: NEGATIVE
Nitrite: NEGATIVE
PROTEIN: NEGATIVE mg/dL
Specific Gravity, Urine: 1.01 (ref 1.005–1.030)
Urobilinogen, UA: 0.2 mg/dL (ref 0.0–1.0)
pH: 6 (ref 5.0–8.0)

## 2013-12-08 LAB — BASIC METABOLIC PANEL
BUN: 14 mg/dL (ref 6–23)
CALCIUM: 9.4 mg/dL (ref 8.4–10.5)
CO2: 29 mEq/L (ref 19–32)
Chloride: 101 mEq/L (ref 96–112)
Creatinine, Ser: 0.61 mg/dL (ref 0.50–1.10)
GFR calc Af Amer: >90 mL/min (ref >90)
GFR calc non Af Amer: >90 mL/min (ref >90)
Glucose, Bld: 113 mg/dL — ABNORMAL HIGH (ref 70–99)
Potassium: 4.1 mEq/L (ref 3.7–5.3)
Sodium: 140 mEq/L (ref 137–147)

## 2013-12-08 LAB — LIPASE, BLOOD: Lipase: 26 U/L (ref 11–59)

## 2013-12-08 LAB — TROPONIN I

## 2013-12-08 MED ORDER — ONDANSETRON HCL 4 MG/2ML IJ SOLN
4.0000 mg | Freq: Once | INTRAMUSCULAR | Status: AC
  Administered 2013-12-08: 4 mg via INTRAVENOUS
  Filled 2013-12-08: qty 2

## 2013-12-08 MED ORDER — NAPROXEN 375 MG PO TABS: 375.0000 mg | ORAL_TABLET | Freq: Two times a day (BID) | ORAL | Status: DC

## 2013-12-08 MED ORDER — LORAZEPAM 2 MG/ML IJ SOLN
1.0000 mg | Freq: Once | INTRAMUSCULAR | Status: AC
  Administered 2013-12-08: 15:00:00 via INTRAVENOUS
  Filled 2013-12-08: qty 1

## 2013-12-08 MED ORDER — FENTANYL CITRATE 0.05 MG/ML IJ SOLN
50.0000 ug | Freq: Once | INTRAMUSCULAR | Status: AC
  Administered 2013-12-08: 15:00:00 via INTRAVENOUS
  Filled 2013-12-08: qty 2

## 2013-12-08 MED ORDER — CYCLOBENZAPRINE HCL 5 MG PO TABS: 5.0000 mg | ORAL_TABLET | Freq: Three times a day (TID) | ORAL | Status: DC | PRN

## 2013-12-08 NOTE — ED Provider Notes (Signed)
CSN: [id number]     Arrival date & time 12/08/13  1305 History  This chart was scribed for Janice Norrie, MD by Ladene Artist, ED Scribe. The patient was seen in room APA14/APA14. Patient's care was started at 1:28 PM.   Chief Complaint  Patient presents with  . Chest Pain    The history is provided by the patient. No language interpreter was used.   HPI Comments: Kendra [name] is a 68 y.o. female who presents to the Emergency Department complaining of intermittent L-sided chest pain onset 3 days ago that resolved and returned last night. Pain lasts for approximately 5 minutes. She reports more than 20 episodes. Pt felt a burning pain approximately 1 hour ago and requested to come to the ED. She denies chest pain at this time. However during the interview she grabbed her left chest and abdomen with pain.  Nothing improves the pain. Nothing makes the pain come on. She also reports  emesis that she describes as burning, back pain and intermittent hematuria. She initially denies h/o similar chest pain, however she has had chest pain before. . Pt's mother passed of stroke at age 64. Pt denies smoking or alcohol use.    Husband reports gradually worsening depression. Pt's daughter passed 4 years ago in car accident July 16. He states the 16th of every month she has a hard time. Pt takes Zoloft and amitriptyline.  She has a h/o an attempt when daughter passed. He states sometimes he worries she might hurt herself, but he hasn't seen any indication that she will do something.   PCP Dr Legrand Rams  Past Medical History  Diagnosis Date  . Hypertension   . Migraine   . Hypercholesterolemia   . Arthritis   . Thyroid disease   . Depression   . Panic attacks     daily, worse the 15th of each month  . Conversion disorder     caused by stress of daughter's death in October 26, 2009  . GERD (gastroesophageal reflux disease)   . Chronic back pain   . Kidney stone    Past Surgical History  Procedure Laterality Date   . Cholecystectomy    . Abdominal hysterectomy    . Appendectomy    . Thyroid surgery    . Cystoscopy     History reviewed. No pertinent family history. History  Substance Use Topics  . Smoking status: Never Smoker   . Smokeless tobacco: Not on file  . 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  Gastrointestinal: Positive for vomiting and abdominal pain.  Genitourinary: Positive for hematuria.  Musculoskeletal: Positive for back pain.  Psychiatric/Behavioral: The patient is nervous/anxious.   All other systems reviewed and are negative.   Allergies  Hydrocodone; Morphine; and Pineapple  Home Medications   Prior to Admission medications   Medication Sig Start Date End Date Taking? Authorizing Provider  amitriptyline (ELAVIL) 50 MG tablet Take 100 mg by mouth at bedtime as needed. For sleep   Yes Historical Provider, MD  lisinopril-hydrochlorothiazide (PRINZIDE,ZESTORETIC) 20-25 MG per tablet Take 1 tablet by mouth at bedtime.   Yes Historical Provider, MD  pantoprazole (PROTONIX) 20 MG tablet Take 20 mg by mouth daily.     Yes Historical  Provider, MD  pravastatin (PRAVACHOL) 20 MG tablet Take 20 mg by mouth at bedtime.   Yes Historical Provider, MD  propranolol (INDERAL) 20 MG tablet Take 20 mg by mouth at bedtime.    Yes Historical Provider, MD  sertraline (ZOLOFT) 50 MG tablet Take 100 mg by mouth at bedtime. For mood   Yes Historical Provider, MD   Triage Vitals: BP 126/84  Pulse 87  Temp(Src) 98.8 F (37.1 C) (Oral)  Resp 14  Ht 5\' 6"  (1.676 m)  Wt 230 lb (104.327 kg)  BMI 37.14 kg/m2  SpO2 95%  Vital signs normal   Physical Exam  Nursing note and vitals reviewed. Constitutional: She is oriented to person, place, and time. She appears well-developed and well-nourished.  Non-toxic appearance.  She does not appear ill. She appears distressed.  Dry heaved after IV started, seems anxious  HENT:  Head: Normocephalic and atraumatic.  Right Ear: External ear normal.  Left Ear: External ear normal.  Nose: Nose normal. No mucosal edema or rhinorrhea.  Mouth/Throat: Oropharynx is clear and moist and mucous membranes are normal. No dental abscesses or uvula swelling.  Eyes: Conjunctivae and EOM are normal. Pupils are equal, round, and reactive to light.  Neck: Normal range of motion and full passive range of motion without pain. Neck supple.  Cardiovascular: Normal rate, regular rhythm and normal heart sounds.  Exam reveals no gallop and no friction rub.   No murmur heard. Pulmonary/Chest: Effort normal and breath sounds normal. No respiratory distress. She has no wheezes. She has no rhonchi. She has no rales. She exhibits no tenderness and no crepitus.  Abdominal: Soft. Normal appearance and bowel sounds are normal. She exhibits no distension. There is tenderness. There is no rebound and no guarding.    Mild epigastric tenderness Tender in LUQ and L lower ribcage  Musculoskeletal: Normal range of motion. She exhibits no edema and no tenderness.  Moves all extremities well.   Neurological: She is alert and oriented to person, place, and time. She has normal strength. No cranial nerve deficit.  Skin: Skin is warm, dry and intact. No rash noted. No erythema. No pallor.  Psychiatric: Her speech is normal. Her mood appears anxious. She is agitated.    ED Course  Procedures (including critical care time)  Medications  ondansetron (ZOFRAN) injection 4 mg (4 mg Intravenous Given 12/08/13 1513)  LORazepam (ATIVAN) injection 1 mg ( Intravenous Given 12/08/13 1523)  fentaNYL (SUBLIMAZE) injection 50 mcg ( Intravenous Given 12/08/13 1515)    DIAGNOSTIC STUDIES: Oxygen Saturation is 95% on RA, adeqaute by my interpretation.    COORDINATION OF CARE: 1:43 PM-Discussed treatment plan which  includes KUB with pt at bedside and pt agreed to plan.   Recheck at discharge. Pt sleeping, when awakened states pain comes and goes but not as bad. Given test results, husband relieved everything was normal.   Labs Review Results for orders placed during the hospital encounter of 12/08/13  CBC WITH DIFFERENTIAL      Result Value Ref Range   WBC 6.5  4.0 - 10.5 K/uL   RBC 4.39  3.87 - 5.11 MIL/uL   Hemoglobin 13.7  12.0 - 15.0 g/dL   HCT 41.0  36.0 - 46.0 %   MCV 93.4  78.0 - 100.0 fL   MCH 31.2  26.0 - 34.0 pg   MCHC 33.4  30.0 - 36.0 g/dL   RDW 14.0  11.5 - 15.5 %   Platelets 294  150 - 400  K/uL   Neutrophils Relative % 68  43 - 77 %   Neutro Abs 4.5  1.7 - 7.7 K/uL   Lymphocytes Relative 20  12 - 46 %   Lymphs Abs 1.3  0.7 - 4.0 K/uL   Monocytes Relative 7  3 - 12 %   Monocytes Absolute 0.4  0.1 - 1.0 K/uL   Eosinophils Relative 4  0 - 5 %   Eosinophils Absolute 0.2  0.0 - 0.7 K/uL   Basophils Relative 1  0 - 1 %   Basophils Absolute 0.0  0.0 - 0.1 K/uL  TROPONIN I      Result Value Ref Range   Troponin I <0.30  <0.30 ng/mL  BASIC METABOLIC PANEL      Result Value Ref Range   Sodium 140  137 - 147 mEq/L   Potassium 4.1  3.7 - 5.3 mEq/L   Chloride 101  96 - 112 mEq/L   CO2 29  19 - 32 mEq/L   Glucose, Bld 113 (*) 70 - 99 mg/dL   BUN 14  6 - 23 mg/dL   Creatinine, Ser 0.61  0.50 - 1.10 mg/dL   Calcium 9.4  8.4 - 10.5 mg/dL   GFR calc non Af Amer >90  >90 mL/min   GFR calc Af Amer >90  >90 mL/min  URINALYSIS, ROUTINE W REFLEX MICROSCOPIC      Result Value Ref Range   Color, Urine YELLOW  YELLOW   APPearance CLEAR  CLEAR   Specific Gravity, Urine 1.010  1.005 - 1.030   pH 6.0  5.0 - 8.0   Glucose, UA NEGATIVE  NEGATIVE mg/dL   Hgb urine dipstick NEGATIVE  NEGATIVE   Bilirubin Urine NEGATIVE  NEGATIVE   Ketones, ur NEGATIVE  NEGATIVE mg/dL   Protein, ur NEGATIVE  NEGATIVE mg/dL   Urobilinogen, UA 0.2  0.0 - 1.0 mg/dL   Nitrite NEGATIVE  NEGATIVE   Leukocytes, UA  NEGATIVE  NEGATIVE  HEPATIC FUNCTION PANEL      Result Value Ref Range   Total Protein 6.6  6.0 - 8.3 g/dL   Albumin 3.2 (*) 3.5 - 5.2 g/dL   AST 17  0 - 37 U/L   ALT 11  0 - 35 U/L   Alkaline Phosphatase 123 (*) 39 - 117 U/L   Total Bilirubin 0.4  0.3 - 1.2 mg/dL   Bilirubin, Direct <0.2  0.0 - 0.3 mg/dL   Indirect Bilirubin NOT CALCULATED  0.3 - 0.9 mg/dL  LIPASE, BLOOD      Result Value Ref Range   Lipase 26  11 - 59 U/L   Laboratory interpretation all normal   Imaging Review Ct Abdomen Pelvis Wo Contrast  12/08/2013   CLINICAL DATA:  Left upper quadrant pain radiating into the back. Evaluate for kidney stones. UTI.  EXAM: CT ABDOMEN AND PELVIS WITHOUT CONTRAST  TECHNIQUE: Multidetector CT imaging of the abdomen and pelvis was performed following the standard protocol without IV contrast.  COMPARISON:  02/01/2013  FINDINGS: Mild subpleural fibrosis is again seen in the medial right lung base related to bulky thoracic osteophytes.  The gallbladder is surgically absent. 1.6 cm partially calcified lesion in the inferior right hepatic lobe is unchanged. The spleen and adrenal glands have an unremarkable unenhanced appearance. Fatty infiltration of pancreas is unchanged.  A punctate calculus is again seen in the upper pole of the right kidney. Slight prominence of the right renal pelvis and calices is unchanged without frank hydronephrosis.  6 mm calculus in the interpolar left kidney is unchanged. 2 mm calculus more medial in the interpolar left kidney is also unchanged. There is very mild left hydronephrosis, decreased from prior. Left perinephric stranding on the prior study has resolved. Dilatation of the left ureter has resolved. No stones are identified along the course of either ureter. No stones are seen in the bladder, which is unremarkable.  The small and large bowel are nondilated without evidence of obstruction. Scattered colonic diverticula are noted without evidence of diverticulitis.  Appendix is not identified. Coarse calcification in the pelvis is unchanged. Uterus is absent. Mild to moderate aortoiliac atherosclerotic calcification is noted. No free fluid or enlarged lymph nodes are identified.  IMPRESSION: Small bilateral, nonobstructing renal calculi, left greater than right. There is very mild left hydronephrosis, improved from prior, without obstructing stone identified.   Electronically Signed   By: Logan Bores   On: 12/08/2013 15:07   US Abdomen Complete  12/08/2013   CLINICAL DATA:  LUQ pain  EXAM: ULTRASOUND ABDOMEN COMPLETE  COMPARISON:  Prior CT from 02/01/2013  FINDINGS: Gallbladder:  Gallbladder has been surgically removed. Sonographic Percell Miller sign was absent.  Common bile duct:  Diameter: 6.8 mm  Liver:  The liver itself is within normal limits for echogenicity. 0.7 x 1.3 x 0.8 cm echogenic focus within the right hepatic lobe is stable as compared to prior CT.  IVC:  No abnormality visualized.  Pancreas:  Visualized portion unremarkable.  Spleen:  Size and appearance within normal limits.  Right Kidney:  Length: 9.4 cm. Echogenicity within normal limits. No mass or hydronephrosis visualized.  Left Kidney:  Length: 10.1 cm. Echogenicity within normal limits. There is mild left hydronephrosis. 1.5 cm linear echogenic focus may represent a left renal calculus, similar to prior CT.  Abdominal aorta:  No aneurysm visualized.  Other findings:  None.  IMPRESSION: 1. Mild left hydronephrosis. 2. 1.5 cm echogenic focus within the left kidney, suggestive of a possible left renal calculus. This is likely similar relative to prior CT from 02/01/2013. 3. Status post cholecystectomy. 4. Stable calcified lesion within the right hepatic lobe, likely benign.   Electronically Signed   By: Jeannine Boga M.D.   On: 12/08/2013 14:54   Dg Chest Portable 1 View  12/08/2013   CLINICAL DATA:  Chest pain  EXAM: PORTABLE CHEST - 1 VIEW  COMPARISON:  09/28/2012  FINDINGS: The heart size and  mediastinal contours are within normal limits. Both lungs are clear. The visualized skeletal structures are unremarkable.  IMPRESSION: No active disease.   Electronically Signed   By: Kathreen Devoid   On: 12/08/2013 13:51     EKG Interpretation   Date/Time:  Tuesday Dec 08 2013 13:18:48 EDT Ventricular Rate:  89 PR Interval:  149 QRS Duration: 76 QT Interval:  362 QTC Calculation: 440 R Axis:   71 Text Interpretation:  Sinus rhythm Nonspecific T abnormalities, lateral  leads Baseline wander in lead(s) III V3 No significant change since last  tracing Confirmed by Maryori Weide  MD-I, Lainey Nelson ([id number]) on 12/08/2013 1:40:41 PM      MDM   Final diagnoses:  LUQ pain  Left-sided chest wall pain   New Prescriptions   CYCLOBENZAPRINE (FLEXERIL) 5 MG TABLET    Take 1 tablet (5 mg total) by mouth 3 (three) times daily as needed for muscle spasms.   NAPROXEN (NAPROSYN) 375 MG TABLET    Take 1 tablet (375 mg total) by mouth 2 (two) times daily.  Plan discharge   Rolland Porter, MD, FACEP   I personally performed the services described in this documentation, which was scribed in my presence. The recorded information has been reviewed and considered.  Rolland Porter, MD, Abram Sander   Janice Norrie, MD 12/08/13 [phone]

## 2013-12-08 NOTE — ED Notes (Signed)
Pt c/o upper abd/lower chest pain that radiates around to back area that has been intermittent for the past three days, pain is associated with nausea at times, Dr Tomi Bamberger at bedside, see edp assessment for further,

## 2013-12-08 NOTE — ED Notes (Signed)
Husband states pt with right sided cp and right abd pain for 2 days, denies fever/SOB/ or N/V/D, LBM this morning

## 2013-12-08 NOTE — ED Notes (Signed)
Patient given discharge instruction, verbalized understand. IV removed, band aid applied. Patient ambulatory out of the department.  

## 2013-12-08 NOTE — ED Notes (Signed)
Pt here for CP, pt unable to speak [place]

## 2013-12-08 NOTE — Discharge Instructions (Signed)
Go home and rest. Take the medications as prescribed. Recheck as needed.   Dolor de la pared torcica (Chest Wall Pain) Dolor en la pared torcica es dolor en o alrededor de los huesos y msculos de su pecho. Podrn pasar hasta 6 semanas hasta que comience a mejorar. Puede demorar ms tiempo si es fsicamente activo en su [name].  CAUSAS  El dolor en el pecho puede aparecer sin motivo. No obstante, algunas causas pueden ser:   [name] enfermedad viral como la gripe.  Traumatismos.  Tos.  La prctica de ejercicios.  Artritis.  Fibromialgia  [place]. INSTRUCCIONES PARA EL CUIDADO DOMICILIARIO  Evite hacer actividad fsica extenuante. Trate de no esforzarse o Librarian, academic. Aqu se incluyen las actividades en las que [place] los msculos del trax, los abdominales y los msculos laterales, especialmente si debe levantar objetos pesados.  Aplique hielo sobre la zona dolorida.  Ponga el hielo en una bolsa plstica.  Colquese una toalla entre la piel y la bolsa de hielo.  Deje la bolsa de hielo durante 15 a 20 minutos por hora, durante los primeros 2 das.  Utilice los medicamentos de venta libre o de prescripcin para Conservation officer, historic buildings, Health and safety inspector o la [place], segn se lo indique el profesional que lo asiste. SOLICITE ATENCIN MDICA DE INMEDIATO SI:  El dolor aumenta o siente muchas molestias.  Tiene fiebre.  El dolor de [place].  Desarrolla nuevos e inexplicables sntomas.  Tiene nuseas o vmitos.  [name] o se siente mareado.  Tiene tos con flema (esputo), o tose con sangre. EST SEGURO QUE:   Comprende las instrucciones para el alta mdica.  Controlar su enfermedad.  Solicitar atencin mdica de inmediato segn las indicaciones. Document Released: 08/20/2006 Document Revised: 10/01/2011 [facility] Patient Information 2014 [place], [place].

## 2013-12-08 NOTE — ED Notes (Signed)
Pt laying in bed, husband at the bedside, states medication is helping

## 2014-07-12 ENCOUNTER — Ambulatory Visit (HOSPITAL_COMMUNITY)
Admission: RE | Admit: 2014-07-12 | Discharge: 2014-07-12 | Disposition: A | Discharge status: Home / Self Care | Payer: Medicare (Managed Care) | Source: Ambulatory Visit | Attending: Internal Medicine | Admitting: Internal Medicine

## 2014-07-12 ENCOUNTER — Other Ambulatory Visit (HOSPITAL_COMMUNITY): Payer: Self-pay | Admitting: Internal Medicine

## 2014-07-12 DIAGNOSIS — M25511 Pain in right shoulder: Secondary | ICD-10-CM | POA: Insufficient documentation

## 2014-09-02 ENCOUNTER — Emergency Department (HOSPITAL_COMMUNITY): Payer: Medicare (Managed Care)

## 2014-09-02 ENCOUNTER — Encounter (HOSPITAL_COMMUNITY): Payer: Self-pay | Admitting: Emergency Medicine

## 2014-09-02 ENCOUNTER — Emergency Department (HOSPITAL_COMMUNITY)
Admission: EM | Admit: 2014-09-02 | Discharge: 2014-09-02 | Disposition: A | Discharge status: Home / Self Care | Payer: Medicare (Managed Care) | Attending: Emergency Medicine | Admitting: Emergency Medicine

## 2014-09-02 DIAGNOSIS — Z87442 Personal history of urinary calculi: Secondary | ICD-10-CM | POA: Diagnosis not present

## 2014-09-02 DIAGNOSIS — G8929 Other chronic pain: Secondary | ICD-10-CM | POA: Diagnosis not present

## 2014-09-02 DIAGNOSIS — F329 Major depressive disorder, single episode, unspecified: Secondary | ICD-10-CM | POA: Insufficient documentation

## 2014-09-02 DIAGNOSIS — F410 Panic disorder [episodic paroxysmal anxiety] without agoraphobia: Secondary | ICD-10-CM | POA: Diagnosis not present

## 2014-09-02 DIAGNOSIS — Z79899 Other long term (current) drug therapy: Secondary | ICD-10-CM | POA: Insufficient documentation

## 2014-09-02 DIAGNOSIS — E780 Pure hypercholesterolemia: Secondary | ICD-10-CM | POA: Insufficient documentation

## 2014-09-02 DIAGNOSIS — M1990 Unspecified osteoarthritis, unspecified site: Secondary | ICD-10-CM | POA: Diagnosis not present

## 2014-09-02 DIAGNOSIS — R200 Anesthesia of skin: Secondary | ICD-10-CM | POA: Insufficient documentation

## 2014-09-02 DIAGNOSIS — K219 Gastro-esophageal reflux disease without esophagitis: Secondary | ICD-10-CM | POA: Diagnosis not present

## 2014-09-02 DIAGNOSIS — I10 Essential (primary) hypertension: Secondary | ICD-10-CM | POA: Diagnosis not present

## 2014-09-02 DIAGNOSIS — M545 Low back pain: Secondary | ICD-10-CM | POA: Insufficient documentation

## 2014-09-02 DIAGNOSIS — G43909 Migraine, unspecified, not intractable, without status migrainosus: Secondary | ICD-10-CM | POA: Diagnosis not present

## 2014-09-02 DIAGNOSIS — R519 Headache, unspecified: Secondary | ICD-10-CM

## 2014-09-02 DIAGNOSIS — R079 Chest pain, unspecified: Secondary | ICD-10-CM | POA: Diagnosis present

## 2014-09-02 DIAGNOSIS — R0602 Shortness of breath: Secondary | ICD-10-CM | POA: Insufficient documentation

## 2014-09-02 DIAGNOSIS — R51 Headache: Secondary | ICD-10-CM

## 2014-09-02 LAB — CBC WITH DIFFERENTIAL/PLATELET
BASOS ABS: 0 10*3/uL (ref 0.0–0.1)
Basophils Relative: 0 % (ref 0–1)
Eosinophils Absolute: 0.4 10*3/uL (ref 0.0–0.7)
Eosinophils Relative: 5 % (ref 0–5)
HCT: 42.1 % (ref 36.0–46.0)
Hemoglobin: 14 g/dL (ref 12.0–15.0)
Lymphocytes Relative: 19 % (ref 12–46)
Lymphs Abs: 1.8 10*3/uL (ref 0.7–4.0)
MCH: 31.5 pg (ref 26.0–34.0)
MCHC: 33.3 g/dL (ref 30.0–36.0)
MCV: 94.8 fL (ref 78.0–100.0)
Monocytes Absolute: 0.6 10*3/uL (ref 0.1–1.0)
Monocytes Relative: 6 % (ref 3–12)
NEUTROS PCT: 70 % (ref 43–77)
Neutro Abs: 6.8 10*3/uL (ref 1.7–7.7)
PLATELETS: 301 10*3/uL (ref 150–400)
RBC: 4.44 MIL/uL (ref 3.87–5.11)
RDW: 13.9 % (ref 11.5–15.5)
WBC: 9.6 10*3/uL (ref 4.0–10.5)

## 2014-09-02 LAB — BASIC METABOLIC PANEL
Anion gap: 4 — ABNORMAL LOW (ref 5–15)
BUN: 22 mg/dL (ref 6–23)
CO2: 31 mmol/L (ref 19–32)
CREATININE: 0.69 mg/dL (ref 0.50–1.10)
Calcium: 9 mg/dL (ref 8.4–10.5)
Chloride: 104 mmol/L (ref 96–112)
GFR calc Af Amer: >90 mL/min (ref >90)
GFR, EST NON AFRICAN AMERICAN: 86 mL/min — AB (ref >90)
Glucose, Bld: 129 mg/dL — ABNORMAL HIGH (ref 70–99)
Potassium: 3.6 mmol/L (ref 3.5–5.1)
SODIUM: 139 mmol/L (ref 135–145)

## 2014-09-02 LAB — TROPONIN I: Troponin I: <0.03 ng/mL (ref <0.031)

## 2014-09-02 LAB — URINALYSIS, ROUTINE W REFLEX MICROSCOPIC
BILIRUBIN URINE: NEGATIVE
Glucose, UA: NEGATIVE mg/dL
Hgb urine dipstick: NEGATIVE
Ketones, ur: NEGATIVE mg/dL
NITRITE: NEGATIVE
PROTEIN: NEGATIVE mg/dL
Specific Gravity, Urine: 1.02 (ref 1.005–1.030)
Urobilinogen, UA: 0.2 mg/dL (ref 0.0–1.0)
pH: 6 (ref 5.0–8.0)

## 2014-09-02 LAB — URINE MICROSCOPIC-ADD ON

## 2014-09-02 MED ORDER — SODIUM CHLORIDE 0.9 % IV BOLUS (SEPSIS)
500.0000 mL | Freq: Once | INTRAVENOUS | Status: AC
  Administered 2014-09-02: 500 mL via INTRAVENOUS

## 2014-09-02 MED ORDER — DIPHENHYDRAMINE HCL 50 MG/ML IJ SOLN
INTRAMUSCULAR | Status: AC
  Filled 2014-09-02: qty 1

## 2014-09-02 MED ORDER — DIPHENHYDRAMINE HCL 50 MG/ML IJ SOLN
12.5000 mg | Freq: Once | INTRAMUSCULAR | Status: AC
  Administered 2014-09-02: 12.5 mg via INTRAVENOUS

## 2014-09-02 MED ORDER — PROCHLORPERAZINE EDISYLATE 5 MG/ML IJ SOLN
5.0000 mg | Freq: Once | INTRAMUSCULAR | Status: AC
  Administered 2014-09-02: 5 mg via INTRAVENOUS
  Filled 2014-09-02: qty 2

## 2014-09-02 NOTE — ED Provider Notes (Signed)
CSN: [id number]     Arrival date & time 09/02/14  1936 History  This chart was scribed for [employer]. Alvino Chapel, MD by Rayfield Citizen, ED Scribe. This patient was seen in room APA19/APA19 and the patient's care was started at 7:56 PM.    Chief Complaint  Patient presents with  . Chest Pain   Patient is a 69 y.o. female presenting with chest pain. The history is provided by the patient. No language interpreter was used (Husband translated).  Chest Pain Associated symptoms: back pain, headache, numbness and shortness of breath      HPI Comments: KIMLEY APSEY is a 69 y.o. female with past medical history of HTN, migraine, HLD, arthritis, chronic back pain, kidney stone who presents to the Emergency Department complaining of 2 days of "sharp" upper right-sided chest pain, with associated SOB. She also notes radiation to her head ("eye pain", photophobia) and legs ("left leg numbness.") Her pain increases with breathing. Patient reports prior experience with similar symptoms but the only diagnosis she can recall is "angina." She has never had a stress test or other cardiology studies.  Patient also complains of lower back pain, which increases with movement. She denies urinary symptoms. She has prior history of back pain; husband reports that patient has a history of kidney stones.   Past Medical History  Diagnosis Date  . Hypertension   . Migraine   . Hypercholesterolemia   . Arthritis   . Thyroid disease   . Depression   . Panic attacks     daily, worse the 15th of each month  . Conversion disorder     caused by stress of daughter's death in Oct 23, 2009  . GERD (gastroesophageal reflux disease)   . Chronic back pain   . Kidney stone    Past Surgical History  Procedure Laterality Date  . Cholecystectomy    . Abdominal hysterectomy    . Appendectomy    . Thyroid surgery    . Cystoscopy     History reviewed. No pertinent family history. History  Substance Use Topics  . Smoking status:  Never Smoker   . Smokeless tobacco: Not on file  . Alcohol Use: No   OB History    No data available     Review of Systems  Eyes: Positive for photophobia.  Respiratory: Positive for shortness of breath.   Cardiovascular: Positive for chest pain.  Musculoskeletal: Positive for back pain.  Neurological: Positive for numbness and headaches.   Allergies  Hydrocodone; Morphine; and Pineapple  Home Medications   Prior to Admission medications   Medication Sig Start Date End Date Taking? Authorizing Provider  amitriptyline (ELAVIL) 50 MG tablet Take 100 mg by mouth at bedtime as needed. For sleep   Yes Historical Provider, MD  lisinopril-hydrochlorothiazide (PRINZIDE,ZESTORETIC) 20-25 MG per tablet Take 1 tablet by mouth at bedtime.   Yes Historical Provider, MD  pantoprazole (PROTONIX) 20 MG tablet Take 20 mg by mouth daily.     Yes Historical Provider, MD  pravastatin (PRAVACHOL) 20 MG tablet Take 20 mg by mouth at bedtime.   Yes Historical Provider, MD  propranolol (INDERAL) 20 MG tablet Take 20 mg by mouth at bedtime.    Yes Historical Provider, MD  sertraline (ZOLOFT) 50 MG tablet Take 100 mg by mouth at bedtime. For mood   Yes Historical Provider, MD  cyclobenzaprine (FLEXERIL) 5 MG tablet Take 1 tablet (5 mg total) by mouth 3 (three) times daily as needed for muscle spasms. Patient  not taking: Reported on 09/02/2014 12/08/13   Janice Norrie, MD  naproxen (NAPROSYN) 375 MG tablet Take 1 tablet (375 mg total) by mouth 2 (two) times daily. Patient not taking: Reported on 09/02/2014 12/08/13   Janice Norrie, MD   BP 130/70 mmHg  Pulse 100  Temp(Src) 98.1 F (36.7 C) (Oral)  Resp 18  Ht 5\' 3"  (1.6 m)  Wt 213 lb (96.616 kg)  BMI 37.74 kg/m2  SpO2 100% Physical Exam  Constitutional: She is oriented to person, place, and time. She appears well-developed and well-nourished. No distress.  HENT:  Head: Normocephalic and atraumatic.  Mouth/Throat: Oropharynx is clear and moist. No  oropharyngeal exudate.  Eyes: EOM are normal. Pupils are equal, round, and reactive to light.  Photophobia  Neck: Normal range of motion. Neck supple.  Cardiovascular: Normal rate, regular rhythm and normal heart sounds.  Exam reveals no gallop and no friction rub.   No murmur heard. Pulmonary/Chest: Effort normal. No respiratory distress. She has no wheezes. She has no rales. She exhibits tenderness.  TTP to right upper medial chest wall; no crepitus, no deformity  Abdominal: Soft. There is no tenderness. There is no rebound and no guarding.  Musculoskeletal: Normal range of motion. She exhibits tenderness. She exhibits no edema.  Mild lumbar tenderness  Neurological: She is alert and oriented to person, place, and time.  Good grip strength and SLR bilaterally  Skin: Skin is warm and dry. No rash noted.  Psychiatric: She has a normal mood and affect. Her behavior is normal.  Nursing note and vitals reviewed.   ED Course  Procedures   DIAGNOSTIC STUDIES: Oxygen Saturation is 100% on RA, normal by my interpretation.    COORDINATION OF CARE: 8:03 PM Discussed treatment plan with pt at bedside and pt agreed to plan.   Labs Review Labs Reviewed  BASIC METABOLIC PANEL - Abnormal; Notable for the following:    Glucose, Bld 129 (*)    GFR calc non Af Amer 86 (*)    Anion gap 4 (*)    All other components within normal limits  URINALYSIS, ROUTINE W REFLEX MICROSCOPIC - Abnormal; Notable for the following:    Leukocytes, UA SMALL (*)    All other components within normal limits  CBC WITH DIFFERENTIAL/PLATELET  TROPONIN I  URINE MICROSCOPIC-ADD ON    Imaging Review Dg Chest 2 View  09/02/2014   CLINICAL DATA:  Acute onset of right-sided chest pain and shortness of breath. Lightheadedness, dizziness and headache. Initial encounter.  EXAM: CHEST  2 VIEW  COMPARISON:  Chest radiograph performed 12/08/2013  FINDINGS: The lungs are well-aerated and clear. There is no evidence of focal  opacification, pleural effusion or pneumothorax.  The heart is normal in size; the mediastinal contour is within normal limits. No acute osseous abnormalities are seen. Mild degenerative change is noted at the upper lumbar spine. Clips are noted within the right upper quadrant, reflecting prior cholecystectomy.  IMPRESSION: No acute cardiopulmonary process seen.   Electronically Signed   By: Garald Balding M.D.   On: 09/02/2014 21:21     EKG Interpretation   Date/Time:  Thursday September 02 2014 19:56:02 EST Ventricular Rate:  85 PR Interval:  157 QRS Duration: 85 QT Interval:  393 QTC Calculation: 467 R Axis:   45 Text Interpretation:  Sinus rhythm Confirmed by Alvino Chapel  MD, Ovid Curd  [phone]) on 09/02/2014 11:01:56 PM      MDM   Final diagnoses:  Acute nonintractable headache, unspecified  headache type  Chest pain, unspecified chest pain type   patient had multiple complaints. Headache I pain chest pain back pain and leg pain. She has had some the symptoms with her headaches in the past. EKG and lab work reassuring. Treated for migraine and headache improved as did all the other symptoms. Will discharge home.  I personally performed the services described in this documentation, which was scribed in my presence. The recorded information has been reviewed and is accurate.       Jasper Riling. Alvino Chapel, MD 09/02/14 [phone]

## 2014-09-02 NOTE — Discharge Instructions (Signed)
Dolor de pecho (no especfico) (Chest Pain (Nonspecific)) Con frecuencia es difcil dar un diagnstico especfico de la causa del dolor de Kendra [name]. Siempre hay una posibilidad de que el dolor podra estar relacionado con algo grave, como un ataque al corazn o un cogulo sanguneo en los pulmones. Debe someterse a controles con el mdico para ms evaluaciones. CAUSAS   Acidez.  Neumona o bronquitis.  Ansiedad o estrs.  Inflamacin de la zona que rodea al corazn (pericarditis) o a los pulmones (pleuritis o pleuresa).  Un cogulo sanguneo en el pulmn.  Colapso de un pulmn (neumotrax), que puede aparecer de [employer] repentina por s solo (neumotrax espontneo) o debido a un traumatismo en el trax.  Culebrilla (virus del herpes zster). La pared torcica est compuesta por huesos, msculos y Database administrator. Cualquiera de estos puede ser la fuente del dolor.  Puede haber una contusin en los huesos debido a una lesin.  Puede haber un esguince en los msculos o el cartlago ocasionado por la tos o por [name].  El cartlago puede verse afectado por una inflamacin y Engineer, production (costocondritis). DIAGNSTICO  Kendra [name] se necesiten anlisis de laboratorio u otros estudios para Animator causa del Social research officer, government. Adems, puede indicarle que se haga una prueba llamada electrocadiograma (ECG) ambulatorio. El ECG registra los patrones de los latidos cardacos durante 24horas. Adems, pueden hacerle otros estudios, por ejemplo:  Ecocardiograma transtorcico (ETT). Durante IT trainer, se usan ondas sonoras para evaluar el flujo de la sangre a travs del corazn.  Ecocardiograma transesofgico (ETE).  Monitoreo cardaco. Permite que el mdico controle la frecuencia y el ritmo cardaco en tiempo real.  Monitor Holter. Es un dispositivo porttil que [name] latidos cardacos y [name] a Retail buyer las arritmias cardacas. Le permite al [employer] registrar la actividad [name], si es necesario.  Pruebas de estrs por ejercicio o por medicamentos que aceleran los latidos cardacos. TRATAMIENTO   El tratamiento depende de la causa del dolor de [name]. El tratamiento puede incluir:  Inhibidores de la acidez estomacal.  Antiinflamatorios.  Analgsicos para las enfermedades inflamatorias.  Antibiticos, si hay una infeccin.  Podrn aconsejarle que modifique su estilo de vida. Esto incluye dejar de fumar y evitar el alcohol, la cafena y el chocolate.  Pueden aconsejarle que mantenga la cabeza levantada (elevada) cuando duerme. Esto reduce la probabilidad de que el cido retroceda del estmago al esfago. En la [employer], el dolor de pecho no especfico mejorar en el trmino de 2 a 3das, con reposo y [employer].  INSTRUCCIONES PARA EL CUIDADO EN EL HOGAR   Si le prescriben antibiticos, tmelos tal como se le indic. Termnelos aunque comience a sentirse mejor.  [address], no haga actividades fsicas que provoquen dolor de [name]. Contine con las actividades fsicas tal como se le indic  No consuma ningn producto que contenga tabaco, incluidos cigarrillos, tabaco de Higher education careers adviser o cigarrillos electrnicos.  Evite el consumo de alcohol.  Tome los medicamentos solamente como se lo haya indicado el mdico.  Siga las sugerencias del mdico en lo que respecta a las pruebas adicionales, si el dolor de pecho no desaparece.  Concurra a todas las visitas de control programadas. Si no lo hace, podra desarrollar problemas permanentes (crnicos) relacionados con el dolor. Si hay algn problema para concurrir a una cita, llame para reprogramarla. SOLICITE ATENCIN MDICA SI:   El dolor de pecho no desaparece, incluso despus del tratamiento.  Tiene una erupcin cutnea con ampollas en el  pecho.  Kendra [name]. SOLICITE ATENCIN MDICA DE [name] SI:   Aumenta el dolor de pecho o este se irradia hacia el  brazo, el cuello, la [place], la espalda o el abdomen.  Le falta el aire.  La tos empeora, o expectora sangre.  Siente dolor intenso en la espalda o el abdomen.  Se siente nauseoso o vomita.  Siente debilidad intensa.  Se desmaya.  Tiene escalofros. Esto es Engineer, maintenance (IT). No espere a ver si el dolor se pasa. Obtenga ayuda mdica de inmediato. Llame a los servicios de emergencia locales (911 en [place]). No conduzca por sus propios medios [employer] hospital. ASEGRESE DE QUE:   Comprende estas instrucciones.  Controlar su afeccin.  Recibir ayuda de inmediato si no mejora o si empeora. Document Released: 07/09/2005 Document Revised: 07/14/2013 [facility] Patient Information 2015 [name]. This information is not intended to replace advice given to you by your health care provider. Make sure you discuss any questions you have with your health care provider.  Dolor de cabeza general sin causa  (General Headache Without Cause)   EL dolor de cabeza es un dolor o malestar que se siente en la zona de la cabeza o del cuello. Puede no tener una causa especfica. Hay muchas causas y tipos de dolores de [place]. Los ms comunes son:   Cefalea tensional.  Cefaleas migraosas.  Cefalea en brotes.  Cefaleas diarias crnicas. INSTRUCCIONES PARA EL CUIDADO EN EL HOGAR   [place]umpl[place] con todas las citas programadas con su mdico o con el especialista al que lo hayan derivado.  Slo tome medicamentos de venta libre o recetados para Glass blower/designer o Health and safety inspector, segn las indicaciones de su mdico.  Cuando sienta dolor de cabeza acustese en un cuarto oscuro y tranquilo.  Lleve un registro diario para Neurosurgeon lo que Engineer, mining. Por ejemplo, escriba:  Lo que come y bebe.  Cunto tiempo duerme.  Todo cambio en la dieta o medicamentos.  Intente con masajes u otras tcnicas de relajacin.  Colquese compresas de hielo o calor en la cabeza y en el cuello. selos  3 a 4 veces por da de 15 a 20 minutos por vez, o como sea necesario.  Limite las situaciones de estrs.  Sintese con la espalda recta y no  tense los msculos.  Si fuma, deje de hacerlo.  Limite el consumo de bebidas alcohlicas.  Consuma menos cantidad de cafena o deje de tomarla.  Coma y duerma en horarios regulares.  Duerma entre 7 y 20 horas o como lo indique su [place] luces tenues si le molestan las luces brillantes y Actor dolor de [place]. SOLICITE ATENCIN MDICA SI:   Tiene problemas con los Haematologist.  Los medicamentos no Teaching laboratory technician.  El dolor de cabeza que senta habitualmente es diferente.  Tiene nuseas o vmitos. SOLICITE ATENCIN MDICA DE INMEDIATO SI:   El dolor se hace cada vez ms intenso.  Tiene fiebre.  Presenta rigidez en el cuello.  Sufre prdida de la visin.  Presenta debilidad muscular o prdida del control muscular.  Comienza a perder el equilibrio o tiene problemas para caminar.  Sufre mareos o se desmaya.  Tiene sntomas graves que son diferentes a los primeros sntomas. ASEGRESE DE QUE:   Comprende estas instrucciones.  Controlar su enfermedad.  Solicitar ayuda de inmediato si no mejora o empeora. Document Released: 04/18/2005 Document Revised: 01/08/2012 [facility] Patient Information 2015 [place]. This information is not  intended to replace advice given to you by your health care provider. Make sure you discuss any questions you have with your health care provider. ° °

## 2014-09-02 NOTE — ED Notes (Signed)
Patient also complains of pain to across lower back. Husband reports history of kidney stones. Patient speaks very little [place]. Husband translating in triage.

## 2014-09-02 NOTE — ED Notes (Signed)
Patient reports right-sided chest pain x 2 days with shortness of breath, feeling lightheaded, dizziness, and headache.

## 2015-03-15 ENCOUNTER — Emergency Department (HOSPITAL_COMMUNITY): Payer: Medicare (Managed Care)

## 2015-03-15 ENCOUNTER — Emergency Department (HOSPITAL_COMMUNITY)
Admission: EM | Admit: 2015-03-15 | Discharge: 2015-03-16 | Disposition: A | Discharge status: Home / Self Care | Payer: Medicare (Managed Care) | Attending: Emergency Medicine | Admitting: Emergency Medicine

## 2015-03-15 ENCOUNTER — Encounter (HOSPITAL_COMMUNITY): Payer: Self-pay | Admitting: *Deleted

## 2015-03-15 DIAGNOSIS — M1990 Unspecified osteoarthritis, unspecified site: Secondary | ICD-10-CM | POA: Diagnosis not present

## 2015-03-15 DIAGNOSIS — E780 Pure hypercholesterolemia: Secondary | ICD-10-CM | POA: Diagnosis not present

## 2015-03-15 DIAGNOSIS — R109 Unspecified abdominal pain: Secondary | ICD-10-CM

## 2015-03-15 DIAGNOSIS — Z79899 Other long term (current) drug therapy: Secondary | ICD-10-CM | POA: Diagnosis not present

## 2015-03-15 DIAGNOSIS — F410 Panic disorder [episodic paroxysmal anxiety] without agoraphobia: Secondary | ICD-10-CM | POA: Diagnosis not present

## 2015-03-15 DIAGNOSIS — G8929 Other chronic pain: Secondary | ICD-10-CM | POA: Diagnosis not present

## 2015-03-15 DIAGNOSIS — F329 Major depressive disorder, single episode, unspecified: Secondary | ICD-10-CM | POA: Insufficient documentation

## 2015-03-15 DIAGNOSIS — R1032 Left lower quadrant pain: Secondary | ICD-10-CM | POA: Insufficient documentation

## 2015-03-15 DIAGNOSIS — Z87442 Personal history of urinary calculi: Secondary | ICD-10-CM | POA: Diagnosis not present

## 2015-03-15 DIAGNOSIS — K219 Gastro-esophageal reflux disease without esophagitis: Secondary | ICD-10-CM | POA: Insufficient documentation

## 2015-03-15 DIAGNOSIS — Z9049 Acquired absence of other specified parts of digestive tract: Secondary | ICD-10-CM | POA: Diagnosis not present

## 2015-03-15 DIAGNOSIS — Z90710 Acquired absence of both cervix and uterus: Secondary | ICD-10-CM | POA: Insufficient documentation

## 2015-03-15 DIAGNOSIS — I10 Essential (primary) hypertension: Secondary | ICD-10-CM | POA: Insufficient documentation

## 2015-03-15 DIAGNOSIS — R300 Dysuria: Secondary | ICD-10-CM | POA: Diagnosis not present

## 2015-03-15 DIAGNOSIS — G43909 Migraine, unspecified, not intractable, without status migrainosus: Secondary | ICD-10-CM | POA: Insufficient documentation

## 2015-03-15 LAB — URINE MICROSCOPIC-ADD ON

## 2015-03-15 LAB — BASIC METABOLIC PANEL
ANION GAP: 7 (ref 5–15)
BUN: 20 mg/dL (ref 6–20)
CHLORIDE: 101 mmol/L (ref 101–111)
CO2: 29 mmol/L (ref 22–32)
Calcium: 9 mg/dL (ref 8.9–10.3)
Creatinine, Ser: 0.63 mg/dL (ref 0.44–1.00)
GFR calc Af Amer: >60 mL/min (ref >60)
GLUCOSE: 106 mg/dL — AB (ref 65–99)
POTASSIUM: 4 mmol/L (ref 3.5–5.1)
Sodium: 137 mmol/L (ref 135–145)

## 2015-03-15 LAB — CBC WITH DIFFERENTIAL/PLATELET
BASOS ABS: 0.1 10*3/uL (ref 0.0–0.1)
Basophils Relative: 1 % (ref 0–1)
Eosinophils Absolute: 0.4 10*3/uL (ref 0.0–0.7)
Eosinophils Relative: 5 % (ref 0–5)
HEMATOCRIT: 41.7 % (ref 36.0–46.0)
Hemoglobin: 13.9 g/dL (ref 12.0–15.0)
LYMPHS ABS: 1.6 10*3/uL (ref 0.7–4.0)
LYMPHS PCT: 22 % (ref 12–46)
MCH: 31.5 pg (ref 26.0–34.0)
MCHC: 33.3 g/dL (ref 30.0–36.0)
MCV: 94.6 fL (ref 78.0–100.0)
Monocytes Absolute: 0.8 10*3/uL (ref 0.1–1.0)
Monocytes Relative: 11 % (ref 3–12)
NEUTROS ABS: 4.5 10*3/uL (ref 1.7–7.7)
Neutrophils Relative %: 61 % (ref 43–77)
Platelets: 252 10*3/uL (ref 150–400)
RBC: 4.41 MIL/uL (ref 3.87–5.11)
RDW: 14.2 % (ref 11.5–15.5)
WBC: 7.4 10*3/uL (ref 4.0–10.5)

## 2015-03-15 LAB — URINALYSIS, ROUTINE W REFLEX MICROSCOPIC
Bilirubin Urine: NEGATIVE
Glucose, UA: NEGATIVE mg/dL
Hgb urine dipstick: NEGATIVE
Ketones, ur: NEGATIVE mg/dL
Nitrite: NEGATIVE
PH: 6.5 (ref 5.0–8.0)
PROTEIN: NEGATIVE mg/dL
Specific Gravity, Urine: 1.02 (ref 1.005–1.030)
Urobilinogen, UA: 0.2 mg/dL (ref 0.0–1.0)

## 2015-03-15 MED ORDER — ONDANSETRON HCL 4 MG/2ML IJ SOLN
4.0000 mg | Freq: Once | INTRAMUSCULAR | Status: AC
  Administered 2015-03-15: 4 mg via INTRAVENOUS
  Filled 2015-03-15: qty 2

## 2015-03-15 MED ORDER — SODIUM CHLORIDE 0.9 % IV SOLN
1000.0000 mL | Freq: Once | INTRAVENOUS | Status: AC
Start: 2015-03-15 — End: 2015-03-16
  Administered 2015-03-15: 1000 mL via INTRAVENOUS

## 2015-03-15 MED ORDER — HYDROMORPHONE HCL 1 MG/ML IJ SOLN
1.0000 mg | Freq: Once | INTRAMUSCULAR | Status: AC
  Administered 2015-03-15: 1 mg via INTRAVENOUS
  Filled 2015-03-15: qty 1

## 2015-03-15 MED ORDER — SODIUM CHLORIDE 0.9 % IV SOLN: 1000.0000 mL | INTRAVENOUS | Status: DC

## 2015-03-15 NOTE — ED Provider Notes (Signed)
CSN: [id number]     Arrival date & time 03/15/15  10/30/34 History  This chart was scribed for Delora Fuel, MD by Helane Gunther, ED Scribe. This patient was seen in room APA01/APA01 and the patient's care was started at 10:31 PM.    Chief Complaint  Patient presents with  . Flank Pain   The history is provided by the patient. No language interpreter was used.   HPI Comments: Kendra [name] is a 69 y.o. female with a PMHx of 2 left-sided kidney stones who presents to the Emergency Department complaining of bilateral, worsening, sharp flank pain onset 3 weeks ago. She notes the pain is now radiating to the lower abdomen. She reports associated burning dysuria, HA, nausea, and vomiting. She has not taken anything for this. She has had pain like this before when she was diagnosed with kidney stones. Pt denies fever. She is allergic to morphine and hydrocodone.  Past Medical History  Diagnosis Date  . Hypertension   . Migraine   . Hypercholesterolemia   . Arthritis   . Thyroid disease   . Depression   . Panic attacks     daily, worse the 15th of each month  . Conversion disorder     caused by stress of daughter's death in 2009/10/29  . GERD (gastroesophageal reflux disease)   . Chronic back pain   . Kidney stone    Past Surgical History  Procedure Laterality Date  . Cholecystectomy    . Abdominal hysterectomy    . Appendectomy    . Thyroid surgery    . Cystoscopy     No family history on file. Social History  Substance Use Topics  . Smoking status: Never Smoker   . Smokeless tobacco: None  . Alcohol Use: No   OB History    No data available     Review of Systems  Constitutional: Negative for fever.  Gastrointestinal: Positive for nausea, vomiting and abdominal pain.  Genitourinary: Positive for dysuria and flank pain.  Neurological: Positive for headaches.  All other systems reviewed and are negative.   Allergies  Hydrocodone; Morphine; and Pineapple  Home Medications    Prior to Admission medications   Medication Sig Start Date End Date Taking? Authorizing Provider  amitriptyline (ELAVIL) 50 MG tablet Take 100 mg by mouth at bedtime as needed. For sleep   Yes Historical Provider, MD  lisinopril-hydrochlorothiazide (PRINZIDE,ZESTORETIC) 20-25 MG per tablet Take 1 tablet by mouth at bedtime.   Yes Historical Provider, MD  pantoprazole (PROTONIX) 20 MG tablet Take 20 mg by mouth daily.     Yes Historical Provider, MD  pravastatin (PRAVACHOL) 20 MG tablet Take 20 mg by mouth at bedtime.   Yes Historical Provider, MD  propranolol (INDERAL) 20 MG tablet Take 20 mg by mouth at bedtime.    Yes Historical Provider, MD  sertraline (ZOLOFT) 50 MG tablet Take 100 mg by mouth at bedtime. For mood   Yes Historical Provider, MD   BP 150/79 mmHg  Pulse 77  Temp(Src) 98 F (36.7 C) (Oral)  Resp 18  Ht 5\' 5"  (1.651 m)  Wt 217 lb (98.431 kg)  BMI 36.11 kg/m2  SpO2 98% Physical Exam  Constitutional: She is oriented to person, place, and time. She appears well-developed and well-nourished.  HENT:  Head: Normocephalic and atraumatic.  Eyes: EOM are normal. Pupils are equal, round, and reactive to light. Right eye exhibits no discharge. Left eye exhibits no discharge.  Neck: Normal range of motion.  Neck supple. No JVD present.  Cardiovascular: Normal rate, regular rhythm and normal heart sounds.   Pulmonary/Chest: Effort normal and breath sounds normal. She has no wheezes. She has no rales. She exhibits no tenderness.  Abdominal: Soft. She exhibits no distension and no mass. There is tenderness.  Moderate L CVA TTP. Mild LLQ TTP. Bowel sounds decreased.  Musculoskeletal: Normal range of motion. She exhibits no edema.  Lymphadenopathy:    She has no cervical adenopathy.  Neurological: She is alert and oriented to person, place, and time. No cranial nerve deficit. She exhibits normal muscle tone. Coordination normal.  Skin: Skin is warm and dry. No rash noted. She is not  diaphoretic.  Psychiatric: She has a normal mood and affect. Her behavior is normal. Judgment and thought content normal.  Nursing note and vitals reviewed.   ED Course  Procedures  DIAGNOSTIC STUDIES: Oxygen Saturation is 98% on RA, normal by my interpretation.    COORDINATION OF CARE: 10:37 PM - Discussed plans to order a CAT scan, pain medication, and anti-nausea medication, as well as urinalysis. Pt advised of plan for treatment and pt agrees.  Labs Review Results for orders placed or performed during the hospital encounter of 03/15/15  Urinalysis, Routine w reflex microscopic (not at [facility])  Result Value Ref Range   Color, Urine YELLOW YELLOW   APPearance CLEAR CLEAR   Specific Gravity, Urine 1.020 1.005 - 1.030   pH 6.5 5.0 - 8.0   Glucose, UA NEGATIVE NEGATIVE mg/dL   Hgb urine dipstick NEGATIVE NEGATIVE   Bilirubin Urine NEGATIVE NEGATIVE   Ketones, ur NEGATIVE NEGATIVE mg/dL   Protein, ur NEGATIVE NEGATIVE mg/dL   Urobilinogen, UA 0.2 0.0 - 1.0 mg/dL   Nitrite NEGATIVE NEGATIVE   Leukocytes, UA SMALL (A) NEGATIVE  Basic metabolic panel  Result Value Ref Range   Sodium 137 135 - 145 mmol/L   Potassium 4.0 3.5 - 5.1 mmol/L   Chloride 101 101 - 111 mmol/L   CO2 29 22 - 32 mmol/L   Glucose, Bld 106 (H) 65 - 99 mg/dL   BUN 20 6 - 20 mg/dL   Creatinine, Ser 0.63 0.44 - 1.00 mg/dL   Calcium 9.0 8.9 - 10.3 mg/dL   GFR calc non Af Amer >60 >60 mL/min   GFR calc Af Amer >60 >60 mL/min   Anion gap 7 5 - 15  CBC with Differential  Result Value Ref Range   WBC 7.4 4.0 - 10.5 K/uL   RBC 4.41 3.87 - 5.11 MIL/uL   Hemoglobin 13.9 12.0 - 15.0 g/dL   HCT 41.7 36.0 - 46.0 %   MCV 94.6 78.0 - 100.0 fL   MCH 31.5 26.0 - 34.0 pg   MCHC 33.3 30.0 - 36.0 g/dL   RDW 14.2 11.5 - 15.5 %   Platelets 252 150 - 400 K/uL   Neutrophils Relative % 61 43 - 77 %   Neutro Abs 4.5 1.7 - 7.7 K/uL   Lymphocytes Relative 22 12 - 46 %   Lymphs Abs 1.6 0.7 - 4.0 K/uL   Monocytes Relative 11 3  - 12 %   Monocytes Absolute 0.8 0.1 - 1.0 K/uL   Eosinophils Relative 5 0 - 5 %   Eosinophils Absolute 0.4 0.0 - 0.7 K/uL   Basophils Relative 1 0 - 1 %   Basophils Absolute 0.1 0.0 - 0.1 K/uL  Urine microscopic-add on  Result Value Ref Range   Squamous Epithelial / LPF MANY (A) RARE  WBC, UA 0-2 <3 WBC/hpf   RBC / HPF 0-2 <3 RBC/hpf   Bacteria, UA MANY (A) RARE    Imaging Review Ct Renal Stone Study  03/15/2015   CLINICAL DATA:  Left-sided kidney stones  EXAM: CT ABDOMEN AND PELVIS WITHOUT CONTRAST  TECHNIQUE: Multidetector CT imaging of the abdomen and pelvis was performed following the standard protocol without IV contrast.  COMPARISON:  12/08/2013  FINDINGS: Lower chest: The lung bases are clear. No pleural or pericardial effusion.  Hepatobiliary: Similar appearance of partially calcified structure within segment 6 of the liver measuring 1.5 cm, image 30/series 2. Previous cholecystectomy. No biliary dilatation.  Pancreas: Negative.  Spleen: Negative.  Adrenals/Urinary Tract: The adrenal glands are both normal. There is a nonobstructing left renal calculus within the inferior pole measuring 4 mm. Right-sided renal sinus cyst versus mild hydronephrosis is noted. The appearance is similar to previous exam. No right-sided kidney stones identified. No right hydroureter or ureteral lithiasis. Left-sided renal sinus cysts versus hydronephrosis is similar to previous studies. No obstructing calculus identified. Urinary bladder appears normal.  Stomach/Bowel: The stomach is normal. The small bowel loops have a normal course and caliber. No obstruction. Normal appearance of the colon.  Vascular/Lymphatic: Calcified atherosclerotic disease involves the abdominal aorta. No aneurysm. No enlarged retroperitoneal or mesenteric adenopathy. No enlarged pelvic or inguinal lymph nodes.  Reproductive: Previous hysterectomy.  No adnexal mass.  Other: No free fluid or fluid collections identified within the abdomen  or pelvis.  Musculoskeletal: Degenerative disc disease is noted at L2-3 and L5-S1. No aggressive lytic or sclerotic bone lesion.  IMPRESSION: 1. No obstructing calculus identified. No ureteral calculi identified. 2. 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 There is a nonobstructing calculus within the mid left kidney. 3. Aortic atherosclerosis.   Electronically Signed   By: Kerby Moors M.D.   On: 03/15/2015 23:59   I have personally reviewed and evaluated these images and lab results as part of my medical decision-making. I have also discussed the CT findings with radiologist.   MDM   Final diagnoses:  Left flank pain    Flank pain concerning for possible ureterolithiasis. Old records are reviewed and she does have history of prior kidney stones. Last CT scan done one year ago did show 2 calculi within the left kidney. She is given hydromorphone for pain and is sent for CT  Renal stone protocol. She had good relief of pain with hydromorphone. Urinalysis is a contaminated specimen, but no pyuria. CT shows caliectasis (chronic), but no hydronephrosis. Cause of pain is unclear. She is discharged with prescription for tramadol, and is referred to urology for further evaluation.  I personally performed the services described in this documentation, which was scribed in my presence. The recorded information has been reviewed and is accurate.     Delora Fuel, MD 59/93/57 [id number]

## 2015-03-15 NOTE — ED Notes (Signed)
Pt c/o bilateral flank pain that radiates to abd area that started two weeks ago, pain has remained constant, does have burning with urination, n/v, pt states that she has hx of kidney stones and this feels similar,

## 2015-03-16 MED ORDER — TRAMADOL HCL 50 MG PO TABS
50.0000 mg | ORAL_TABLET | Freq: Four times a day (QID) | ORAL | Status: DC | PRN
Start: 2015-03-16 — End: 2017-12-24

## 2015-03-16 NOTE — Discharge Instructions (Signed)
Dolor en el flanco  (Flank Pain) El dolor en el flanco se refiere a aquel dolor que se localiza en un lado del cuerpo, entre la zona superior del abdomen y Counsellor. Puede aparecer en un perodo corto de tiempo (agudo) o puede durar mucho tiempo o ser recurrente (crnico). Puede ser leve o intenso. Puede tener diferentes causas.  CAUSAS  Algunas de las causas ms comunes son:   Esguince muscular.   Espasmos musculares.   Problemas en la columna vertebral (enfermedad de los discos).   Infeccin en el pulmn (neumona).   Lquido en los pulmones (edema pulmonar).   Infeccin renal.   Piedras en el rin.   Una erupcin cutnea muy dolorosa causada por el virus de la varicela (herpes zoster).  Enfermedad de la vescula biliar. INSTRUCCIONES PARA EL CUIDADO EN EL HOGAR  Los cuidados en el hogar dependern de la causa del dolor. En general:   Haga reposo segn las indicaciones del mdico.  Debe ingerir gran cantidad de lquido para mantener la orina de tono claro o color amarillo plido.  Tome slo medicamentos de venta libre o recetados, segn las indicaciones del mdico. Algunos medicamentos ayudan a Best boy.  Informe al mdico si tiene algn Risk manager.  Concurra a las visitas de control, segn las indicaciones. SOLICITE ATENCIN MDICA DE INMEDIATO SI:   El dolor no se alivia con los [employer].   Tiene sntomas nuevos o que empeoran.  El dolor [place].   Siente dolor abdominal.   Le falta el aire.   Tiene nuseas o vmitos persistentes.   El abdomen se hincha.   Sufre mareos o se desmaya.   Observa sangre en la orina.  Tiene fiebre o sntomas persistentes durante ms de 2  3 das.  Tiene fiebre y los sntomas empeoran repentinamente. ASEGRESE DE QUE:   Comprende estas instrucciones.  Controlar su enfermedad.  Solicitar ayuda de inmediato si no mejora o si empeora. Document Released: 10/16/2007 Document Revised:  04/02/2012 [facility] Patient Information 2015 [place]. This information is not intended to replace advice given to you by your health care provider. Make sure you discuss any questions you have with your health care provider.  Tramadol tablets Qu es este medicamento? El TRAMADOL es un analgsico. Se utiliza para tratar dolores moderados o severos en adultos. Este medicamento puede ser utilizado para otros usos; si tiene alguna pregunta consulte con su proveedor de atencin mdica o con su farmacutico. MARCAS COMERCIALES DISPONIBLES: Ultram Qu le debo informar a mi profesional de la salud antes de tomar este medicamento? Necesita saber si usted presenta alguno de los [employer] o situaciones: -tumor cerebral -depresin -abuso de drogas o drogadiccin -lesin de la cabeza -si consume bebidas alcohlicas con frecuencia -enfermedad renal o problemas al orinar -enfermedad heptica -enfermedad pulmonar, asma o problemas respiratorios -convulsiones o epilepsia -ideas suicidas, planes o intento; si usted o alguien de su familia ha intentado un suicidio previo -una reaccin alrgica o inusual al tramadol, a la codena, a otros medicamentos, alimentos, colorantes o conservantes -si est embarazada o buscando quedar embarazada -si est amamantando a un beb Cmo debo utilizar este medicamento? Tome este medicamento por va oral con un vaso lleno de agua. Siga las instrucciones de la etiqueta del [place]. Si el Transport planner, tmelo con alimentos o con [place]. No tome su medicamento con una frecuencia mayor que la indicada. Hable con su pediatra para informarse acerca del uso de este medicamento en nios.  Puede requerir atencin especial. Sobredosis: Pngase en contacto inmediatamente con un centro toxicolgico o una sala de urgencia si usted cree que haya tomado demasiado medicamento. ATENCIN: [employer] es solo para usted. No comparta este  medicamento con nadie. Qu sucede si me olvido de una dosis? Si olvida una dosis, tmela lo antes posible. Si es casi la hora de la prxima dosis, tome slo esa dosis. No tome dosis adicionales o dobles. Qu puede interactuar con este medicamento? No tome esta medicina con ninguno de los siguientes medicamentos: -IMAOs, tales como Carbex, Eldepryl, Marplan, Nardil y Parnate Esta medicina tambin puede interactuar con los siguientes medicamentos: -alcohol o medicamentos que contienen alcohol -antihistamnicos -benzodiacepinas -bupropion -carbamazepina u oxcarbazepina -clozapina -ciclobenzaprina -digoxina -furazolidona -linezolid -medicamentos para la depresin, ansiedad o trastornos psicticos -medicamentos para las migraas, tales como almotriptn, eletriptn, frovatriptn, naratriptn, rizatriptn, sumatriptn, zolmitriptn -analgsicos, incluyendo pentazocina, buprenorfina, butorfanol, meperidina, nalbufina y propoxifeno -medicamentos para conciliar el sueo -relajantes musculares -naltrexona -fenobarbital -fenotiazinas, tales como perfenacina, tioridazina, clorpromacina, mesoridazina, flufenazina, proclorperazina, promazina y trifluoperazina -procarbazina -warfarina Puede ser que esta lista no menciona todas las posibles interacciones. Informe a su profesional de [employer]	[place] de [employer] productos a base de hierbas, medicamentos de [place] o suplementos nutritivos que est tomando. Si usted fuma, consume bebidas alcohlicas o si utiliza drogas ilegales, indqueselo tambin a su profesional de [employer]	[place]. Algunas sustancias pueden interactuar con su medicamento. A qu debo estar atento al usar Coca[employer]a? Si el dolor no desaparece, si empeora o si experimenta un dolor nuevo o de tipo diferente, consulte a su mdico o a su profesional de [employer]	[place]. Usted puede desarrollar tolerancia al medicamento. La tolerancia significa que necesitar una dosis ms alta para Best boy.  Tolerancia es normal y esperada cuando est tomando este medicamento por un largo perodo de [place]. No suspenda el uso de su medicamento repentinamente debido a que puede Engineer, materials reaccin severa. Su cuerpo se acostumbra a Fish farm manager. Esto NO significa que sea adicto. La adiccin es un comportamiento que hace referencia a la obtencin y utilizacin de un medicamento con fines que no son mdicos. Si tiene Social research officer, government, existe una razn mdica para que usted tome un analgsico. Su mdico le indicar la cantidad de medicamento que Tree surgeon. Si su mdico desea que [employer], la dosis ser reducida gradualmente para Research officer, political party secundarios. Puede experimentar mareos o somnolencia. No conduzca ni utilice maquinaria, ni haga nada que Associate Professor en estado de alerta hasta que sepa cmo le afecta este medicamento. No se siente ni se ponga de pie con rapidez, especialmente si es un paciente de edad avanzada. Esto reduce el riesgo de mareos o [employer]. El alcohol puede aumentar o disminuir el efecto de este medicamento. Evite consumir bebidas alcohlicas. Este medicamento puede causar estreimiento. Trate de evacuar los intestinos al menos cada 2  3 das. Si no evacua los intestinos durante 3 das, comunquese con su mdico o con su profesional de [employer]	[place]. Se le podr secar la boca. Masticar chicle sin azcar, chupar caramelos duros y tomar agua en abundancia le ayudar a mantener la boca hmeda. Si el problema no desaparece o es severo, consulte a su mdico. Qu efectos secundarios puedo tener al [employer] este medicamento? Efectos secundarios que debe informar a su mdico o a Barrister's clerk de la salud tan pronto como sea posible: -reacciones alrgicas como erupcin cutnea, picazn o urticarias, hinchazn de la cara, labios o lengua -dificultades respiratorias, sibilancias -confusin -picazn -aturdimiento o  desmayos -enrojecimiento, formacin de ampollas, descamacin o  aflojamiento de la piel, inclusive dentro de la boca -convulsiones Efectos secundarios que, por lo general, no requieren atencin mdica (debe informarlos a su mdico o a su profesional de la salud si persisten o si son molestos): -estreimiento -mareos -somnolencia -dolor de cabeza -nuseas, vmitos Puede ser que esta lista no menciona todos los posibles efectos secundarios. Comunquese a su mdico por asesoramiento mdico Hum[employer] Inc. Usted puede informar los efectos secundarios a la FDA por telfono al 1-800-FDA-1088. Dnde debo guardar mi medicina? Mantngala fuera del alcance de los nios. Gurdela a [employer], entre 15 y 32 grados C (51 y 36 grados F). Mantenga el envase bien cerrado. Deseche los medicamentos que no haya utilizado, despus de la fecha de vencimiento. ATENCIN: Este folleto es un resumen. Puede ser que no cubra toda la posible informacin. Si usted tiene preguntas acerca de esta medicina, consulte con su mdico, su farmacutico o su profesional de Technical sales engineer.  2015, Elsevier/Gold Standard. (2010-04-19 17:11:17)

## 2017-12-24 ENCOUNTER — Encounter (HOSPITAL_COMMUNITY): Payer: Self-pay | Admitting: Emergency Medicine

## 2017-12-24 ENCOUNTER — Emergency Department (HOSPITAL_COMMUNITY): Payer: Medicare (Managed Care)

## 2017-12-24 ENCOUNTER — Other Ambulatory Visit: Payer: Self-pay

## 2017-12-24 ENCOUNTER — Observation Stay (HOSPITAL_COMMUNITY)
Admission: EM | Admit: 2017-12-24 | Discharge: 2017-12-27 | Disposition: A | Discharge status: Home / Self Care | Payer: Medicare (Managed Care) | Attending: Internal Medicine | Admitting: Internal Medicine

## 2017-12-24 DIAGNOSIS — G43909 Migraine, unspecified, not intractable, without status migrainosus: Secondary | ICD-10-CM | POA: Insufficient documentation

## 2017-12-24 DIAGNOSIS — R739 Hyperglycemia, unspecified: Secondary | ICD-10-CM | POA: Diagnosis not present

## 2017-12-24 DIAGNOSIS — I2699 Other pulmonary embolism without acute cor pulmonale: Secondary | ICD-10-CM

## 2017-12-24 DIAGNOSIS — M79606 Pain in leg, unspecified: Secondary | ICD-10-CM

## 2017-12-24 DIAGNOSIS — R0789 Other chest pain: Secondary | ICD-10-CM | POA: Diagnosis not present

## 2017-12-24 DIAGNOSIS — I700 Atherosclerosis of aorta: Secondary | ICD-10-CM | POA: Insufficient documentation

## 2017-12-24 DIAGNOSIS — I82499 Acute embolism and thrombosis of other specified deep vein of unspecified lower extremity: Secondary | ICD-10-CM | POA: Insufficient documentation

## 2017-12-24 DIAGNOSIS — E079 Disorder of thyroid, unspecified: Secondary | ICD-10-CM | POA: Diagnosis not present

## 2017-12-24 DIAGNOSIS — F410 Panic disorder [episodic paroxysmal anxiety] without agoraphobia: Secondary | ICD-10-CM | POA: Insufficient documentation

## 2017-12-24 DIAGNOSIS — E669 Obesity, unspecified: Secondary | ICD-10-CM | POA: Insufficient documentation

## 2017-12-24 DIAGNOSIS — M549 Dorsalgia, unspecified: Secondary | ICD-10-CM | POA: Diagnosis not present

## 2017-12-24 DIAGNOSIS — I200 Unstable angina: Secondary | ICD-10-CM | POA: Diagnosis present

## 2017-12-24 DIAGNOSIS — E782 Mixed hyperlipidemia: Secondary | ICD-10-CM | POA: Insufficient documentation

## 2017-12-24 DIAGNOSIS — Z6839 Body mass index (BMI) 39.0-39.9, adult: Secondary | ICD-10-CM | POA: Insufficient documentation

## 2017-12-24 DIAGNOSIS — R079 Chest pain, unspecified: Secondary | ICD-10-CM

## 2017-12-24 DIAGNOSIS — I82409 Acute embolism and thrombosis of unspecified deep veins of unspecified lower extremity: Secondary | ICD-10-CM | POA: Diagnosis present

## 2017-12-24 DIAGNOSIS — Z885 Allergy status to narcotic agent status: Secondary | ICD-10-CM | POA: Insufficient documentation

## 2017-12-24 DIAGNOSIS — M1990 Unspecified osteoarthritis, unspecified site: Secondary | ICD-10-CM | POA: Diagnosis not present

## 2017-12-24 DIAGNOSIS — K219 Gastro-esophageal reflux disease without esophagitis: Secondary | ICD-10-CM | POA: Diagnosis not present

## 2017-12-24 DIAGNOSIS — G4730 Sleep apnea, unspecified: Secondary | ICD-10-CM | POA: Diagnosis not present

## 2017-12-24 DIAGNOSIS — G8929 Other chronic pain: Secondary | ICD-10-CM | POA: Insufficient documentation

## 2017-12-24 DIAGNOSIS — M79605 Pain in left leg: Secondary | ICD-10-CM | POA: Insufficient documentation

## 2017-12-24 DIAGNOSIS — F329 Major depressive disorder, single episode, unspecified: Secondary | ICD-10-CM | POA: Insufficient documentation

## 2017-12-24 DIAGNOSIS — Z7982 Long term (current) use of aspirin: Secondary | ICD-10-CM | POA: Insufficient documentation

## 2017-12-24 DIAGNOSIS — Z86718 Personal history of other venous thrombosis and embolism: Secondary | ICD-10-CM | POA: Diagnosis present

## 2017-12-24 DIAGNOSIS — Z7901 Long term (current) use of anticoagulants: Secondary | ICD-10-CM | POA: Diagnosis not present

## 2017-12-24 DIAGNOSIS — I10 Essential (primary) hypertension: Secondary | ICD-10-CM | POA: Diagnosis not present

## 2017-12-24 LAB — CBC
HEMATOCRIT: 40.2 % (ref 36.0–46.0)
Hemoglobin: 13.1 g/dL (ref 12.0–15.0)
MCH: 31.1 pg (ref 26.0–34.0)
MCHC: 32.6 g/dL (ref 30.0–36.0)
MCV: 95.5 fL (ref 78.0–100.0)
PLATELETS: 302 10*3/uL (ref 150–400)
RBC: 4.21 MIL/uL (ref 3.87–5.11)
RDW: 13.9 % (ref 11.5–15.5)
WBC: 9.7 10*3/uL (ref 4.0–10.5)

## 2017-12-24 LAB — COMPREHENSIVE METABOLIC PANEL
ALK PHOS: 114 U/L (ref 38–126)
ALT: 19 U/L (ref 14–54)
AST: 22 U/L (ref 15–41)
Albumin: 3.9 g/dL (ref 3.5–5.0)
Anion gap: 11 (ref 5–15)
BUN: 17 mg/dL (ref 6–20)
CALCIUM: 9.4 mg/dL (ref 8.9–10.3)
CHLORIDE: 101 mmol/L (ref 101–111)
CO2: 28 mmol/L (ref 22–32)
CREATININE: 0.67 mg/dL (ref 0.44–1.00)
GFR calc Af Amer: >60 mL/min (ref >60)
GFR calc non Af Amer: >60 mL/min (ref >60)
Glucose, Bld: 105 mg/dL — ABNORMAL HIGH (ref 65–99)
Potassium: 3.7 mmol/L (ref 3.5–5.1)
SODIUM: 140 mmol/L (ref 135–145)
Total Bilirubin: 0.5 mg/dL (ref 0.3–1.2)
Total Protein: 7 g/dL (ref 6.5–8.1)

## 2017-12-24 LAB — CBG MONITORING, ED: GLUCOSE-CAPILLARY: 109 mg/dL — AB (ref 65–99)

## 2017-12-24 LAB — TROPONIN I: Troponin I: <0.03 ng/mL (ref <0.03)

## 2017-12-24 MED ORDER — ASPIRIN 81 MG PO CHEW
324.0000 mg | CHEWABLE_TABLET | Freq: Once | ORAL | Status: AC
  Administered 2017-12-24: 324 mg via ORAL
  Filled 2017-12-24: qty 4

## 2017-12-24 MED ORDER — NITROGLYCERIN 0.4 MG SL SUBL
0.4000 mg | SUBLINGUAL_TABLET | SUBLINGUAL | Status: DC | PRN
  Administered 2017-12-24 – 2017-12-25 (×2): 0.4 mg via SUBLINGUAL
  Filled 2017-12-24 (×2): qty 1

## 2017-12-24 NOTE — ED Triage Notes (Signed)
Per family pt was a church and developed sob and chest pain that radiates down left arm x 20 minutes.

## 2017-12-25 ENCOUNTER — Observation Stay (HOSPITAL_COMMUNITY): Payer: Medicare (Managed Care)

## 2017-12-25 ENCOUNTER — Observation Stay (HOSPITAL_BASED_OUTPATIENT_CLINIC_OR_DEPARTMENT_OTHER): Payer: Medicare (Managed Care)

## 2017-12-25 DIAGNOSIS — R079 Chest pain, unspecified: Secondary | ICD-10-CM

## 2017-12-25 DIAGNOSIS — I2510 Atherosclerotic heart disease of native coronary artery without angina pectoris: Secondary | ICD-10-CM | POA: Diagnosis not present

## 2017-12-25 DIAGNOSIS — E782 Mixed hyperlipidemia: Secondary | ICD-10-CM

## 2017-12-25 DIAGNOSIS — R0789 Other chest pain: Secondary | ICD-10-CM | POA: Diagnosis not present

## 2017-12-25 DIAGNOSIS — I10 Essential (primary) hypertension: Secondary | ICD-10-CM

## 2017-12-25 DIAGNOSIS — I824Z1 Acute embolism and thrombosis of unspecified deep veins of right distal lower extremity: Secondary | ICD-10-CM | POA: Diagnosis not present

## 2017-12-25 DIAGNOSIS — I82499 Acute embolism and thrombosis of other specified deep vein of unspecified lower extremity: Secondary | ICD-10-CM | POA: Diagnosis not present

## 2017-12-25 LAB — COMPREHENSIVE METABOLIC PANEL
ALK PHOS: 107 U/L (ref 38–126)
ALT: 18 U/L (ref 14–54)
ANION GAP: 7 (ref 5–15)
AST: 21 U/L (ref 15–41)
Albumin: 3.6 g/dL (ref 3.5–5.0)
BUN: 15 mg/dL (ref 6–20)
CALCIUM: 9.2 mg/dL (ref 8.9–10.3)
CHLORIDE: 102 mmol/L (ref 101–111)
CO2: 32 mmol/L (ref 22–32)
Creatinine, Ser: 0.64 mg/dL (ref 0.44–1.00)
GFR calc Af Amer: >60 mL/min (ref >60)
Glucose, Bld: 128 mg/dL — ABNORMAL HIGH (ref 65–99)
Potassium: 4 mmol/L (ref 3.5–5.1)
SODIUM: 141 mmol/L (ref 135–145)
Total Bilirubin: 0.7 mg/dL (ref 0.3–1.2)
Total Protein: 6.5 g/dL (ref 6.5–8.1)

## 2017-12-25 LAB — TROPONIN I: Troponin I: <0.03 ng/mL (ref <0.03)

## 2017-12-25 LAB — CBC
HCT: 39.1 % (ref 36.0–46.0)
Hemoglobin: 12.7 g/dL (ref 12.0–15.0)
MCH: 31.3 pg (ref 26.0–34.0)
MCHC: 32.5 g/dL (ref 30.0–36.0)
MCV: 96.3 fL (ref 78.0–100.0)
PLATELETS: 285 10*3/uL (ref 150–400)
RBC: 4.06 MIL/uL (ref 3.87–5.11)
RDW: 13.9 % (ref 11.5–15.5)
WBC: 10.8 10*3/uL — ABNORMAL HIGH (ref 4.0–10.5)

## 2017-12-25 LAB — ECHOCARDIOGRAM COMPLETE
HEIGHTINCHES: 63 in
WEIGHTICAEL: 3520 [oz_av]

## 2017-12-25 LAB — PROTIME-INR
INR: 1.1
INR: 1.09
Prothrombin Time: 14.1 seconds (ref 11.4–15.2)
Prothrombin Time: 14.1 seconds (ref 11.4–15.2)

## 2017-12-25 LAB — D-DIMER, QUANTITATIVE: D-Dimer, Quant: 0.98 ug/mL-FEU — ABNORMAL HIGH (ref 0.00–0.50)

## 2017-12-25 LAB — APTT
APTT: 172 s — AB (ref 24–36)
aPTT: 200 seconds (ref 24–36)

## 2017-12-25 MED ORDER — ENOXAPARIN SODIUM 60 MG/0.6ML ~~LOC~~ SOLN
0.5000 mg/kg | SUBCUTANEOUS | Status: DC
  Administered 2017-12-25: 50 mg via SUBCUTANEOUS
  Filled 2017-12-25: qty 0.6

## 2017-12-25 MED ORDER — ALBUTEROL SULFATE (2.5 MG/3ML) 0.083% IN NEBU: 3.0000 mL | INHALATION_SOLUTION | Freq: Four times a day (QID) | RESPIRATORY_TRACT | Status: DC | PRN

## 2017-12-25 MED ORDER — DARIFENACIN HYDROBROMIDE ER 7.5 MG PO TB24
7.5000 mg | ORAL_TABLET | Freq: Every day | ORAL | Status: DC
  Administered 2017-12-25 – 2017-12-27 (×3): 7.5 mg via ORAL
  Filled 2017-12-25 (×3): qty 1

## 2017-12-25 MED ORDER — PROPRANOLOL HCL 20 MG PO TABS
20.0000 mg | ORAL_TABLET | Freq: Every day | ORAL | Status: DC
  Administered 2017-12-25 – 2017-12-26 (×2): 20 mg via ORAL
  Filled 2017-12-25 (×2): qty 1

## 2017-12-25 MED ORDER — RISPERIDONE 1 MG PO TABS
0.5000 mg | ORAL_TABLET | Freq: Every day | ORAL | Status: DC
  Administered 2017-12-25 – 2017-12-26 (×2): 0.5 mg via ORAL
  Filled 2017-12-25 (×2): qty 1

## 2017-12-25 MED ORDER — ATORVASTATIN CALCIUM 40 MG PO TABS
40.0000 mg | ORAL_TABLET | Freq: Every day | ORAL | Status: DC
  Administered 2017-12-25: 40 mg via ORAL
  Filled 2017-12-25 (×2): qty 1

## 2017-12-25 MED ORDER — IOPAMIDOL (ISOVUE-370) INJECTION 76%
100.0000 mL | Freq: Once | INTRAVENOUS | Status: AC | PRN
  Administered 2017-12-25: 100 mL via INTRAVENOUS

## 2017-12-25 MED ORDER — AMLODIPINE BESYLATE 10 MG PO TABS
10.0000 mg | ORAL_TABLET | Freq: Every day | ORAL | Status: DC
  Administered 2017-12-25 – 2017-12-27 (×3): 10 mg via ORAL
  Filled 2017-12-25 (×2): qty 1
  Filled 2017-12-25: qty 2

## 2017-12-25 MED ORDER — ACETAMINOPHEN 325 MG PO TABS
650.0000 mg | ORAL_TABLET | Freq: Four times a day (QID) | ORAL | Status: DC | PRN
  Administered 2017-12-25: 650 mg via ORAL
  Filled 2017-12-25: qty 2

## 2017-12-25 MED ORDER — PANTOPRAZOLE SODIUM 20 MG PO TBEC
20.0000 mg | DELAYED_RELEASE_TABLET | Freq: Every day | ORAL | Status: DC
  Filled 2017-12-25: qty 1

## 2017-12-25 MED ORDER — FLUOROMETHOLONE 0.1 % OP SUSP
1.0000 [drp] | Freq: Every day | OPHTHALMIC | Status: DC
  Administered 2017-12-25 – 2017-12-27 (×2): 1 [drp] via OPHTHALMIC
  Filled 2017-12-25 (×2): qty 5

## 2017-12-25 MED ORDER — SODIUM CHLORIDE 0.9 % IV SOLN
INTRAVENOUS | Status: DC
  Administered 2017-12-25: 04:00:00 via INTRAVENOUS

## 2017-12-25 MED ORDER — HEPARIN (PORCINE) IN NACL 100-0.45 UNIT/ML-% IJ SOLN
950.0000 [IU]/h | INTRAMUSCULAR | Status: DC
  Administered 2017-12-25: 1050 [IU]/h via INTRAVENOUS
  Administered 2017-12-26: 950 [IU]/h via INTRAVENOUS
  Filled 2017-12-25 (×2): qty 250

## 2017-12-25 MED ORDER — HYDROCHLOROTHIAZIDE 25 MG PO TABS
12.5000 mg | ORAL_TABLET | Freq: Every day | ORAL | Status: DC
  Administered 2017-12-25 – 2017-12-27 (×3): 12.5 mg via ORAL
  Filled 2017-12-25 (×3): qty 1

## 2017-12-25 MED ORDER — LISINOPRIL 40 MG PO TABS
40.0000 mg | ORAL_TABLET | Freq: Every day | ORAL | Status: DC
  Administered 2017-12-25 – 2017-12-27 (×3): 40 mg via ORAL
  Filled 2017-12-25: qty 4
  Filled 2017-12-25 (×2): qty 1

## 2017-12-25 MED ORDER — GABAPENTIN 300 MG PO CAPS
300.0000 mg | ORAL_CAPSULE | Freq: Every day | ORAL | Status: DC
  Administered 2017-12-25 – 2017-12-27 (×3): 300 mg via ORAL
  Filled 2017-12-25 (×3): qty 1

## 2017-12-25 MED ORDER — HEPARIN BOLUS VIA INFUSION
4000.0000 [IU] | Freq: Once | INTRAVENOUS | Status: AC
  Administered 2017-12-25: 4000 [IU] via INTRAVENOUS
  Filled 2017-12-25: qty 4000

## 2017-12-25 MED ORDER — ONDANSETRON HCL 4 MG PO TABS: 4.0000 mg | ORAL_TABLET | Freq: Four times a day (QID) | ORAL | Status: DC | PRN

## 2017-12-25 MED ORDER — ONDANSETRON HCL 4 MG/2ML IJ SOLN: 4.0000 mg | Freq: Four times a day (QID) | INTRAMUSCULAR | Status: DC | PRN

## 2017-12-25 MED ORDER — PANTOPRAZOLE SODIUM 40 MG PO TBEC
40.0000 mg | DELAYED_RELEASE_TABLET | Freq: Every day | ORAL | Status: DC
  Administered 2017-12-25 – 2017-12-27 (×3): 40 mg via ORAL
  Filled 2017-12-25 (×3): qty 1

## 2017-12-25 MED ORDER — SERTRALINE HCL 100 MG PO TABS
100.0000 mg | ORAL_TABLET | Freq: Every day | ORAL | Status: DC
  Administered 2017-12-25 – 2017-12-26 (×2): 100 mg via ORAL
  Filled 2017-12-25 (×2): qty 1

## 2017-12-25 MED ORDER — ASPIRIN 81 MG PO CHEW
81.0000 mg | CHEWABLE_TABLET | Freq: Every day | ORAL | Status: DC
  Administered 2017-12-25 – 2017-12-26 (×2): 81 mg via ORAL
  Filled 2017-12-25 (×2): qty 1

## 2017-12-25 NOTE — Progress Notes (Signed)
PROGRESS NOTE  Kendra [name] JKK:[id number] DOB: [birth year] DOA: 12/24/2017 PCP: Rosita Fire, MD  Brief History:  72 year old female with a history of depression, conversion disorder, hypertension, hyperlipidemia presented with chest pain that began on the afternoon of 12/24/2017 while they were at church sitting in the pews.  This was associated with some shortness of breath with radiation of her pain to her jaw and left arm.  The patient recently returned from [place] where she was hospitalized for chest pain.  Apparently, the patient had a nuclear medicine stress test at that time, but was unsure of the exact results of the test.  She states that she never had a heart catheterization.  The patient was admitted for further work-up.  Cardiology was consulted to assist with management.  Assessment/Plan: Chest pain -Atypical by clinical history -Echocardiogram -Cardiology consult -12/25/2017 CTA chest--neg for PE -Troponins negative x2 -Personally reviewed EKG--sinus rhythm, nonspecific T wave change -Personally reviewed chest x-ray--no infiltrates or edema  Essential hypertension -Continue amlodipine, HCTZ, lisinopril, propranolol  Hyperlipidemia -Continue statin -lipid panel  Lower extremity edema and pain -Venous duplex  Depression/anxiety/panic attacks -Continue Risperdal and Zoloft  Hyperglycemia -Check A1c    Disposition Plan:   Home 12/26/17 if cleared by cardiology Family Communication:   Spouse updated at bedside 6/5--Total time spent 31 minutes.  Greater than 50% spent face to face counseling and coordinating care. 0905 to [place]:    Code Status:  FULL / DNR  DVT Prophylaxis:  [place] Heparin / [place] Lovenox   Procedures: As Listed in Progress Note Above  Antibiotics: None    Subjective: Patient denies any chest pain presently.  She denies any nausea, vomiting, diarrhea, abdominal pain, fevers, chills, headache, neck  pain.  Objective: Vitals:   12/25/17 0000 12/25/17 0030 12/25/17 0100 12/25/17 0201  BP: 128/73 (!) 141/95 (!) 148/72 (!) 147/82  Pulse: 74 95 88 87  Resp: 15 (!) 31 (!) 21 19  Temp:    97.9 F (36.6 C)  TempSrc:    Oral  SpO2: 98% 97% 98% 96%  Weight:      Height:       No intake or output data in the 24 hours ending 12/25/17 0930 Weight change:  Exam:   General:  Pt is alert, follows commands appropriately, not in acute distress  HEENT: No icterus, No thrush, No neck mass, [place]/AT  Cardiovascular: RRR, S1/S2, no rubs, no gallops  Respiratory: Bibasilar crackles but no wheezing.  Good air movement.  Abdomen: Soft/+BS, non tender, non distended, no guarding  Extremities: 1 + LE edema, No lymphangitis, No petechiae, No rashes, no synovitis   Data Reviewed: I have personally reviewed following labs and imaging studies Basic Metabolic Panel: Recent Labs  Lab 12/24/17 2135 12/25/17 0246  NA 140 141  K 3.7 4.0  CL 101 102  CO2 28 32  GLUCOSE 105* 128*  BUN 17 15  CREATININE 0.67 0.64  CALCIUM 9.4 9.2   Liver Function Tests: Recent Labs  Lab 12/24/17 2135 12/25/17 0246  AST 22 21  ALT 19 18  ALKPHOS 114 107  BILITOT 0.5 0.7  PROT 7.0 6.5  ALBUMIN 3.9 3.6   No results for input(s): LIPASE, AMYLASE in the last 168 hours. No results for input(s): AMMONIA in the last 168 hours. Coagulation Profile: No results for input(s): INR, PROTIME in the last 168 hours. CBC: Recent Labs  Lab 12/24/17 2135 12/25/17 0246  WBC 9.7 10.8*  HGB 13.1 12.7  HCT 40.2 39.1  MCV 95.5 96.3  PLT 302 285   Cardiac Enzymes: Recent Labs  Lab 12/24/17 2135 12/25/17 0246  TROPONINI <0.03 <0.03   BNP: Invalid input(s): POCBNP CBG: Recent Labs  Lab 12/24/17 2133  GLUCAP 109*   HbA1C: No results for input(s): HGBA1C in the last 72 hours. Urine analysis:    Component Value Date/Time   COLORURINE YELLOW 03/15/2015 2324   APPEARANCEUR CLEAR 03/15/2015 2324   LABSPEC  1.020 03/15/2015 2324   PHURINE 6.5 03/15/2015 2324   GLUCOSEU NEGATIVE 03/15/2015 2324   HGBUR NEGATIVE 03/15/2015 2324   HGBUR trace-intact 04/27/2009 1109   BILIRUBINUR NEGATIVE 03/15/2015 2324   KETONESUR NEGATIVE 03/15/2015 2324   PROTEINUR NEGATIVE 03/15/2015 2324   UROBILINOGEN 0.2 03/15/2015 2324   NITRITE NEGATIVE 03/15/2015 2324   LEUKOCYTESUR SMALL (A) 03/15/2015 2324   Sepsis Labs: @LABRCNTIP (procalcitonin:4,lacticidven:4) )No results found for this or any previous visit (from the past 240 hour(s)).   Scheduled Meds: . amLODipine  10 mg Oral Daily  . atorvastatin  40 mg Oral q1800  . darifenacin  7.5 mg Oral Daily  . enoxaparin (LOVENOX) injection  0.5 mg/kg Subcutaneous Q24H  . fluorometholone  1 drop Left Eye Daily  . gabapentin  300 mg Oral Daily  . hydrochlorothiazide  12.5 mg Oral Daily  . lisinopril  40 mg Oral Daily  . pantoprazole  40 mg Oral Daily  . propranolol  20 mg Oral QHS  . risperiDONE  0.5 mg Oral QHS  . sertraline  100 mg Oral QHS   Continuous Infusions: . sodium chloride 10 mL/hr at 12/25/17 0411    Procedures/Studies: Dg Chest 2 View  Result Date: 12/24/2017 CLINICAL DATA:  Left sided chest pain. EXAM: CHEST - 2 VIEW COMPARISON:  Radiographs 09/02/2014 FINDINGS: The cardiomediastinal contours are normal. Atherosclerosis of the aortic arch. Pulmonary vasculature is normal. No consolidation, pleural effusion, or pneumothorax. No acute osseous abnormalities are seen. IMPRESSION: 1.  No acute pulmonary process. 2.  Aortic Atherosclerosis (ICD10-I70.0). Electronically Signed   By: Jeb Levering M.D.   On: 12/24/2017 23:10   Ct Angio Chest Pe W Or Wo Contrast  Result Date: 12/25/2017 CLINICAL DATA:  Acute onset of shortness of breath and generalized chest pain, radiating down the left arm. EXAM: CT ANGIOGRAPHY CHEST WITH CONTRAST TECHNIQUE: Multidetector CT imaging of the chest was performed using the standard protocol during bolus administration of  intravenous contrast. Multiplanar CT image reconstructions and MIPs were obtained to evaluate the vascular anatomy. CONTRAST:  118mL ISOVUE-370 IOPAMIDOL (ISOVUE-370) INJECTION 76% COMPARISON:  Chest radiograph performed 12/24/2017 FINDINGS: Cardiovascular:  There is no evidence of pulmonary embolus. The heart is normal in size. Diffuse coronary artery calcifications are seen. Mild calcification is noted along the aortic arch. The great vessels are grossly unremarkable. Mediastinum/Nodes: The mediastinum is otherwise unremarkable. No mediastinal lymphadenopathy is seen. No pericardial effusion is identified. The visualized portions of the thyroid gland are unremarkable. No axillary lymphadenopathy is seen. Lungs/Pleura: The lungs are essentially clear bilaterally. No focal consolidation, pleural effusion or pneumothorax is seen. No masses are identified. Upper Abdomen: The visualized portions of the liver and spleen are unremarkable. The patient is status post cholecystectomy, with clips noted at the gallbladder fossa. The visualized portions of the pancreas and adrenal glands are within normal limits. Musculoskeletal: No acute osseous abnormalities are identified. Anterior bridging osteophytes are noted along the thoracic spine. The visualized musculature is unremarkable in appearance. Review of  the MIP images confirms the above findings. IMPRESSION: 1. No evidence of pulmonary embolus. 2. Lungs clear bilaterally. 3. Diffuse coronary artery calcifications seen. Electronically Signed   By: Garald Balding M.D.   On: 12/25/2017 03:16    Orson Eva, DO  [facility] Pager [phone]  If 7PM-7AM, please contact night-coverage [email or web address] Password [email or web address] 12/25/2017, 9:30 AM   LOS: 0 days

## 2017-12-25 NOTE — Progress Notes (Signed)
Report called to [facility] nurse, patient without complaint of chest pain at this time

## 2017-12-25 NOTE — ED Provider Notes (Signed)
[facility] EMERGENCY DEPARTMENT Provider Note   CSN: [id number] Arrival date & time: 12/24/17  10/30/2055     History   Chief Complaint Chief Complaint  Patient presents with  . Chest Pain    HPI Kendra [name] is a 72 y.o. female.  The history is provided by the patient and a relative. A language interpreter was used.  Chest Pain   This is a recurrent problem. The current episode started less than 1 hour ago. The problem occurs constantly. The problem has not changed since onset.Associated with: church. The pain is present in the substernal region. The pain is moderate. The quality of the pain is described as heavy. The pain radiates to the left jaw, left shoulder and left arm. Associated symptoms include nausea and shortness of breath. Risk factors include post-menopausal and obesity.  Her past medical history is significant for CAD (reportedly in [place]).    Past Medical History:  Diagnosis Date  . Arthritis   . Chronic back pain   . Conversion disorder    caused by stress of daughter's death in 10/29/09  . Depression   . GERD (gastroesophageal reflux disease)   . Hypercholesterolemia   . Hypertension   . Kidney stone   . Migraine   . Panic attacks    daily, worse the 15th of each month  . Thyroid disease     Patient Active Problem List   Diagnosis Date Noted  . Chest pain 12/25/2017  . ABSCESS, TOOTH 09/06/2010  . OBESITY 07/12/2010  . CHOLECYSTECTOMY, HX OF 04/25/2010  . DEPRESSION 03/17/2010  . HYPERTENSION, BENIGN ESSENTIAL 10/28/2009  . ALLERGIC RHINITIS 10/12/2009  . SLEEP APNEA 10/12/2009  . HYPERPARATHYROIDISM UNSPECIFIED 07/19/2009  . KNEE PAIN, BILATERAL 01/11/2009  . LABYRINTHITIS, ACUTE 09/29/2008  . BACK PAIN, LUMBAR 08/03/2008  . CALCANEAL SPUR, RIGHT 07/14/2008  . OSTEOPENIA 07/14/2008  . 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 GASTROENTERITIS, ACUTE 01/08/2008  . HEMATURIA UNSPECIFIED 01/08/2008  . TEMPOROMANDIBULAR JOINT PAIN 07/09/2007  .  DYSLIPIDEMIA 03/07/2007  . GERD 03/07/2007  . OSTEOARTHROSIS, LOCAL, PRIMARY, LOWER LEG 03/07/2007  . MIGRAINE HEADACHE 03/03/2007  . CERVICAL MUSCLE STRAIN 03/03/2007  . HELICOBACTER PYLORI INFECTION, HX OF 12/21/2005    Past Surgical History:  Procedure Laterality Date  . ABDOMINAL HYSTERECTOMY    . APPENDECTOMY    . CHOLECYSTECTOMY    . CYSTOSCOPY    . THYROID SURGERY       OB History   None      Home Medications    Prior to Admission medications   Medication Sig Start Date End Date Taking? Authorizing Provider  amLODipine (NORVASC) 10 MG tablet Take 10 mg by mouth daily.  10/15/17  Yes [provider]  atorvastatin (LIPITOR) 40 MG tablet Take 40 mg by mouth daily at 6 PM.  10/15/17  Yes [provider]  fluorometholone (FML) 0.1 % ophthalmic suspension Place 1 drop into the left eye daily. 11/27/17  Yes [provider]  gabapentin (NEURONTIN) 300 MG capsule Take 300 mg by mouth daily.  10/15/17  Yes [provider]  hydrochlorothiazide (HYDRODIURIL) 12.5 MG tablet Take 12.5 mg by mouth daily.  10/15/17  Yes [provider]  lisinopril (PRINIVIL,ZESTRIL) 40 MG tablet Take 40 mg by mouth daily.  11/14/17  Yes [provider]  OPTIMAL-D 73220 units capsule Take 50,000 Units by mouth once a week.  10/15/17  Yes [provider]  pantoprazole (PROTONIX) 20 MG tablet Take 20 mg by mouth daily.  Yes [provider]  propranolol (INDERAL) 20 MG tablet Take 20 mg by mouth at bedtime.    Yes [provider]  risperiDONE (RISPERDAL) 0.5 MG tablet Take 0.5 mg by mouth at bedtime.  11/18/17  Yes [provider]  sertraline (ZOLOFT) 100 MG tablet Take 100 mg by mouth at bedtime.  11/18/17  Yes [provider]  VENTOLIN HFA 108 (90 Base) MCG/ACT inhaler Inhale 1-2 puffs into the lungs every 6 (six) hours as needed for wheezing or shortness of breath.  10/15/17  Yes [provider]  VESICARE  5 MG tablet Take 5 mg by mouth daily.  10/30/17  Yes [provider]    Family History History reviewed. No pertinent family history.  Social History Social History   Tobacco Use  . Smoking status: Never Smoker  . Smokeless tobacco: Never Used  Substance Use Topics  . Alcohol use: No  . Drug use: No     Allergies   Hydrocodone; Morphine; and Pineapple   Review of Systems Review of Systems  Respiratory: Positive for shortness of breath.   Cardiovascular: Positive for chest pain.  Gastrointestinal: Positive for nausea.  All other systems reviewed and are negative.    Physical Exam Updated Vital Signs BP (!) 148/72   Pulse 88   Temp 97.9 F (36.6 C) (Oral)   Resp (!) 21   Ht 5\' 3"  (1.6 m)   Wt 99.8 kg (220 lb)   SpO2 98%   BMI 38.97 kg/m   Physical Exam  Constitutional: She appears well-developed and well-nourished.  HENT:  Head: Normocephalic and atraumatic.  Eyes: Pupils are equal, round, and reactive to light. EOM are normal.  Neck: Normal range of motion.  Cardiovascular: Normal rate and regular rhythm.  Pulmonary/Chest: Effort normal. No stridor. No respiratory distress.  Abdominal: Soft. She exhibits no distension.  Musculoskeletal: Normal range of motion.  Neurological: She is alert.  Skin: Skin is warm and dry.  Nursing note and vitals reviewed.    ED Treatments / Results  Labs (all labs ordered are listed, but only abnormal results are displayed) Labs Reviewed  COMPREHENSIVE METABOLIC PANEL - Abnormal; Notable for the following components:      Result Value   Glucose, Bld 105 (*)    All other components within normal limits  D-DIMER, QUANTITATIVE (NOT AT [facility]) - Abnormal; Notable for the following components:   D-Dimer, Quant 0.98 (*)    All other components within normal limits  CBG MONITORING, ED - Abnormal; Notable for the following components:   Glucose-Capillary 109 (*)    All other components within normal limits  TROPONIN  I  CBC    EKG EKG Interpretation  Date/Time:  Tuesday December 24 2017 21:12:24 EDT Ventricular Rate:  85 PR Interval:    QRS Duration: 77 QT Interval:  398 QTC Calculation: 474 R Axis:   46 Text Interpretation:  Sinus rhythm Abnormal R-wave progression, early transition no change since 2016 ecg Confirmed by Merrily Pew [phone]) on 12/24/2017 10:11:16 PM   Radiology Dg Chest 2 View  Result Date: 12/24/2017 CLINICAL DATA:  Left sided chest pain. EXAM: CHEST - 2 VIEW COMPARISON:  Radiographs 09/02/2014 FINDINGS: The cardiomediastinal contours are normal. Atherosclerosis of the aortic arch. Pulmonary vasculature is normal. No consolidation, pleural effusion, or pneumothorax. No acute osseous abnormalities are seen. IMPRESSION: 1.  No acute pulmonary process. 2.  Aortic Atherosclerosis (ICD10-I70.0). Electronically Signed   By: Jeb Levering M.D.   On: 12/24/2017 23:10  Procedures Procedures (including critical care time)  Medications Ordered in ED Medications  nitroGLYCERIN (NITROSTAT) SL tablet 0.4 mg (0.4 mg Sublingual Given 12/24/17 2135)  aspirin chewable tablet 324 mg (324 mg Oral Given 12/24/17 2134)     Initial Impression / Assessment and Plan / ED Course  I have reviewed the triage vital signs and the nursing notes.  Pertinent labs & imaging results that were available during my care of the patient were reviewed by me and considered in my medical decision making (see chart for details).   Reportedly had 2 heart attacks recently in [place]. This is suspicious sounding for possible ACS as well. Improved with NTG. Quite a few risk factors. High heart score. Will admit to meidicine for ACS rule out.   Final Clinical Impressions(s) / ED Diagnoses   Final diagnoses:  Nonspecific chest pain    ED Discharge Orders    None       Brynli Ollis, Corene Cornea, MD 12/25/17 [phone]

## 2017-12-25 NOTE — Progress Notes (Signed)
PROGRESS NOTE  Kendra [name] WGN:[id number] DOB: 06/29/1946 DOA: 12/24/2017 PCP: Rosita Fire, MD  Brief History:  72 year old female with a history of depression, conversion disorder, hypertension, hyperlipidemia presented with chest pain that began on the afternoon of 12/24/2017 while they were at church sitting in the pews.  This was associated with some shortness of breath with radiation of her pain to her jaw and left arm.  The patient recently returned from [place] where she was hospitalized for chest pain.  Apparently, the patient had a nuclear medicine stress test at that time, but was unsure of the exact results of the test.  She states that she never had a heart catheterization.  The patient was admitted for further work-up.  Cardiology was consulted to assist with management.  Cardiology recommended transfer to [facility] for catheterization.  Assessment/Plan: Chest pain -suspcious for ischemic etiology -Echocardiogram -Cardiology consult appreciated -discussed with Dr. Idalia Needle to [facility] for heart cath 6/6 -12/25/2017 CTA chest--neg for PE -Troponins negative x2 -Personally reviewed EKG--sinus rhythm, nonspecific T wave change -Personally reviewed chest x-ray--no infiltrates or edema  Essential hypertension -Continue amlodipine, HCTZ, lisinopril, propranolol  Hyperlipidemia -Continue statin -lipid panel  Right Peroneal DVT -Venous duplex confirms -start IV heparin  Depression/anxiety/panic attacks -Continue Risperdal and Zoloft  Hyperglycemia -Check A1c   Disposition Plan:   Transfer to [facility] for heart cath Family Communication:   Spouse updated at bedside 6/5--Total time spent 31 minutes.  Greater than 50% spent face to face counseling and coordinating care. 0905 to [place]:  cardiology  Code Status:  FULL / DNR  DVT Prophylaxis:  IV Heparin   Procedures: As Listed in Progress Note  Above  Antibiotics: None     Subjective: Patient denies fevers, chills, headache, chest pain, dyspnea, nausea, vomiting, diarrhea, abdominal pain, dysuria, hematuria, hematochezia, and melena.   Objective: Vitals:   12/25/17 0030 12/25/17 0100 12/25/17 0201 12/25/17 1100  BP: (!) 141/95 (!) 148/72 (!) 147/82 132/72  Pulse: 95 88 87   Resp: (!) 31 (!) 21 19   Temp:   97.9 F (36.6 C)   TempSrc:   Oral   SpO2: 97% 98% 96%   Weight:      Height:       No intake or output data in the 24 hours ending 12/25/17 1221 Weight change:  Exam:   General:  Pt is alert, follows commands appropriately, not in acute distress  HEENT: No icterus, No thrush, No neck mass, [place]/AT  Cardiovascular: RRR, S1/S2, no rubs, no gallops  Respiratory: CTA bilaterally, no wheezing, no crackles, no rhonchi  Abdomen: Soft/+BS, non tender, non distended, no guarding  Extremities: 1 +LE edema, No lymphangitis, No petechiae, No rashes, no synovitis   Data Reviewed: I have personally reviewed following labs and imaging studies Basic Metabolic Panel: Recent Labs  Lab 12/24/17 2135 12/25/17 0246  NA 140 141  K 3.7 4.0  CL 101 102  CO2 28 32  GLUCOSE 105* 128*  BUN 17 15  CREATININE 0.67 0.64  CALCIUM 9.4 9.2   Liver Function Tests: Recent Labs  Lab 12/24/17 2135 12/25/17 0246  AST 22 21  ALT 19 18  ALKPHOS 114 107  BILITOT 0.5 0.7  PROT 7.0 6.5  ALBUMIN 3.9 3.6   No results for input(s): LIPASE, AMYLASE in the last 168 hours. No results for input(s): AMMONIA in the last 168 hours. Coagulation Profile: No results  for input(s): INR, PROTIME in the last 168 hours. CBC: Recent Labs  Lab 12/24/17 2135 12/25/17 0246  WBC 9.7 10.8*  HGB 13.1 12.7  HCT 40.2 39.1  MCV 95.5 96.3  PLT 302 285   Cardiac Enzymes: Recent Labs  Lab 12/24/17 2135 12/25/17 0246 12/25/17 0854  TROPONINI <0.03 <0.03 <0.03   BNP: Invalid input(s): POCBNP CBG: Recent Labs  Lab 12/24/17 2133   GLUCAP 109*   HbA1C: No results for input(s): HGBA1C in the last 72 hours. Urine analysis:    Component Value Date/Time   COLORURINE YELLOW 03/15/2015 2324   APPEARANCEUR CLEAR 03/15/2015 2324   LABSPEC 1.020 03/15/2015 2324   PHURINE 6.5 03/15/2015 2324   GLUCOSEU NEGATIVE 03/15/2015 2324   HGBUR NEGATIVE 03/15/2015 2324   HGBUR trace-intact 04/27/2009 1109   BILIRUBINUR NEGATIVE 03/15/2015 2324   KETONESUR NEGATIVE 03/15/2015 2324   PROTEINUR NEGATIVE 03/15/2015 2324   UROBILINOGEN 0.2 03/15/2015 2324   NITRITE NEGATIVE 03/15/2015 2324   LEUKOCYTESUR SMALL (A) 03/15/2015 2324   Sepsis Labs: @LABRCNTIP (procalcitonin:4,lacticidven:4) )No results found for this or any previous visit (from the past 240 hour(s)).   Scheduled Meds: . amLODipine  10 mg Oral Daily  . aspirin  81 mg Oral Daily  . atorvastatin  40 mg Oral q1800  . darifenacin  7.5 mg Oral Daily  . fluorometholone  1 drop Left Eye Daily  . gabapentin  300 mg Oral Daily  . hydrochlorothiazide  12.5 mg Oral Daily  . lisinopril  40 mg Oral Daily  . pantoprazole  40 mg Oral Daily  . propranolol  20 mg Oral QHS  . risperiDONE  0.5 mg Oral QHS  . sertraline  100 mg Oral QHS   Continuous Infusions: . sodium chloride 10 mL/hr at 12/25/17 0411    Procedures/Studies: Dg Chest 2 View  Result Date: 12/24/2017 CLINICAL DATA:  Left sided chest pain. EXAM: CHEST - 2 VIEW COMPARISON:  Radiographs 09/02/2014 FINDINGS: The cardiomediastinal contours are normal. Atherosclerosis of the aortic arch. Pulmonary vasculature is normal. No consolidation, pleural effusion, or pneumothorax. No acute osseous abnormalities are seen. IMPRESSION: 1.  No acute pulmonary process. 2.  Aortic Atherosclerosis (ICD10-I70.0). Electronically Signed   By: Jeb Levering M.D.   On: 12/24/2017 23:10   Ct Angio Chest Pe W Or Wo Contrast  Result Date: 12/25/2017 CLINICAL DATA:  Acute onset of shortness of breath and generalized chest pain, radiating  down the left arm. EXAM: CT ANGIOGRAPHY CHEST WITH CONTRAST TECHNIQUE: Multidetector CT imaging of the chest was performed using the standard protocol during bolus administration of intravenous contrast. Multiplanar CT image reconstructions and MIPs were obtained to evaluate the vascular anatomy. CONTRAST:  172mL ISOVUE-370 IOPAMIDOL (ISOVUE-370) INJECTION 76% COMPARISON:  Chest radiograph performed 12/24/2017 FINDINGS: Cardiovascular:  There is no evidence of pulmonary embolus. The heart is normal in size. Diffuse coronary artery calcifications are seen. Mild calcification is noted along the aortic arch. The great vessels are grossly unremarkable. Mediastinum/Nodes: The mediastinum is otherwise unremarkable. No mediastinal lymphadenopathy is seen. No pericardial effusion is identified. The visualized portions of the thyroid gland are unremarkable. No axillary lymphadenopathy is seen. Lungs/Pleura: The lungs are essentially clear bilaterally. No focal consolidation, pleural effusion or pneumothorax is seen. No masses are identified. Upper Abdomen: The visualized portions of the liver and spleen are unremarkable. The patient is status post cholecystectomy, with clips noted at the gallbladder fossa. The visualized portions of the pancreas and adrenal glands are within normal limits. Musculoskeletal: No acute osseous abnormalities  are identified. Anterior bridging osteophytes are noted along the thoracic spine. The visualized musculature is unremarkable in appearance. Review of the MIP images confirms the above findings. IMPRESSION: 1. No evidence of pulmonary embolus. 2. Lungs clear bilaterally. 3. Diffuse coronary artery calcifications seen. Electronically Signed   By: Garald Balding M.D.   On: 12/25/2017 03:16   US Venous Img Lower Bilateral  Result Date: 12/25/2017 CLINICAL DATA:  BILATERAL LOWER EXTREMITY PAIN AND SHORTNESS OF BREATH EXAM: BILATERAL LOWER EXTREMITY VENOUS DOPPLER ULTRASOUND TECHNIQUE:  Gray-scale sonography with graded compression, as well as color Doppler and duplex ultrasound were performed to evaluate the lower extremity deep venous systems from the level of the common femoral vein and including the common femoral, femoral, profunda femoral, popliteal and calf veins including the posterior tibial, peroneal and gastrocnemius veins when visible. The superficial great saphenous vein was also interrogated. Spectral Doppler was utilized to evaluate flow at rest and with distal augmentation maneuvers in the common femoral, femoral and popliteal veins. COMPARISON:  None. FINDINGS: RIGHT LOWER EXTREMITY Common Femoral Vein: No evidence of thrombus. Normal compressibility, respiratory phasicity and response to augmentation. Saphenofemoral Junction: No evidence of thrombus. Normal compressibility and flow on color Doppler imaging. Profunda Femoral Vein: No evidence of thrombus. Normal compressibility and flow on color Doppler imaging. Femoral Vein: No evidence of thrombus. Normal compressibility, respiratory phasicity and response to augmentation. Popliteal Vein: No evidence of thrombus. Normal compressibility, respiratory phasicity and response to augmentation. Calf Veins: RIGHT PERONEAL CALF VEIN DEMONSTRATES HYPOECHOIC INTRALUMINAL THROMBUS AND APPEARS NONCOMPRESSIBLE COMPATIBLE WITH CALF REGION DVT. POSTERIOR TIBIAL VEIN APPEARS PATENT. Superficial Great Saphenous Vein: No evidence of thrombus. Normal compressibility. Venous Reflux:  None. Other Findings:  None. LEFT LOWER EXTREMITY Common Femoral Vein: No evidence of thrombus. Normal compressibility, respiratory phasicity and response to augmentation. Saphenofemoral Junction: No evidence of thrombus. Normal compressibility and flow on color Doppler imaging. Profunda Femoral Vein: No evidence of thrombus. Normal compressibility and flow on color Doppler imaging. Femoral Vein: No evidence of thrombus. Normal compressibility, respiratory phasicity and  response to augmentation. Popliteal Vein: No evidence of thrombus. Normal compressibility, respiratory phasicity and response to augmentation. Calf Veins: No evidence of thrombus. Normal compressibility and flow on color Doppler imaging. Superficial Great Saphenous Vein: No evidence of thrombus. Normal compressibility. Venous Reflux:  None. Other Findings:  None. IMPRESSION: Positive exam for right calf region peroneal DVT. No propagation into the popliteal or femoral veins. Negative for left lower extremity DVT. Electronically Signed   By: Jerilynn Mages.  Shick M.D.   On: 12/25/2017 10:54    Orson Eva, DO  [facility] Pager [phone]  If 7PM-7AM, please contact night-coverage [email or web address] Password [email or web address] 12/25/2017, 12:21 PM   LOS: 0 days

## 2017-12-25 NOTE — H&P (Signed)
TRH H&P    Patient Demographics:    Kendra [name], is a 72 y.o. female  MRN: [id number]  DOB - 1946/03/19  Admit Date - 12/24/2017  Referring MD/NP/PA: Dr. Dayna Barker  Outpatient Primary MD for the patient is Rosita Fire, MD  Patient coming from: Home  Chief complaint-chest pain  HPI:    Kendra [name]  is a 72 y.o. female, with history of hypertension, chronic back pain, GERD, panic attacks was brought to hospital after patient developed shortness of breath and chest pain with radiation down left arm lasted for about 20 minutes when she was at church.  Patient is Spanish-speaking, husband acted as Astronomer. Patient was recently at [place] and was told that she has a coronary artery disease, after nuclear stress test was performed. She denies nausea vomiting or diarrhea. Denies fever or dysuria. No previous history of stroke or seizures. She also complains of pain in the left leg, patient does have chronic back pain.    Review of systems:    All other systems reviewed and are negative.   With Past History of the following :    Past Medical History:  Diagnosis Date  . Arthritis   . Chronic back pain   . Conversion disorder    caused by stress of daughter's death in 2009/11/02  . Depression   . GERD (gastroesophageal reflux disease)   . Hypercholesterolemia   . Hypertension   . Kidney stone   . Migraine   . Panic attacks    daily, worse the 15th of each month  . Thyroid disease       Past Surgical History:  Procedure Laterality Date  . ABDOMINAL HYSTERECTOMY    . APPENDECTOMY    . CHOLECYSTECTOMY    . CYSTOSCOPY    . THYROID SURGERY        Social History:      Social History   Tobacco Use  . Smoking status: Never Smoker  . Smokeless tobacco: Never Used  Substance Use Topics  . Alcohol use: No       Family History :   Patient's brother had stroke   Home Medications:     Prior to Admission medications   Medication Sig Start Date End Date Taking? Authorizing Provider  amLODipine (NORVASC) 10 MG tablet Take 10 mg by mouth daily.  10/15/17  Yes [provider]  atorvastatin (LIPITOR) 40 MG tablet Take 40 mg by mouth daily at 6 PM.  10/15/17  Yes [provider]  fluorometholone (FML) 0.1 % ophthalmic suspension Place 1 drop into the left eye daily. 11/27/17  Yes [provider]  gabapentin (NEURONTIN) 300 MG capsule Take 300 mg by mouth daily.  10/15/17  Yes [provider]  hydrochlorothiazide (HYDRODIURIL) 12.5 MG tablet Take 12.5 mg by mouth daily.  10/15/17  Yes [provider]  lisinopril (PRINIVIL,ZESTRIL) 40 MG tablet Take 40 mg by mouth daily.  11/14/17  Yes [provider]  OPTIMAL-D 50037 units capsule Take 50,000 Units by mouth once a week.  10/15/17  Yes [provider]  pantoprazole (PROTONIX) 20 MG tablet Take 20 mg by mouth daily.     Yes [provider]  propranolol (INDERAL) 20 MG tablet Take 20 mg by mouth at bedtime.    Yes [provider]  risperiDONE (RISPERDAL) 0.5 MG tablet Take 0.5 mg by mouth at bedtime.  11/18/17  Yes [provider]  sertraline (ZOLOFT) 100 MG tablet Take 100 mg by mouth at bedtime.  11/18/17  Yes [provider]  VENTOLIN HFA 108 (90 Base) MCG/ACT inhaler Inhale 1-2 puffs into the lungs every 6 (six) hours as needed for wheezing or shortness of breath.  10/15/17  Yes [provider]  VESICARE 5 MG tablet Take 5 mg by mouth daily.  10/30/17  Yes [provider]     Allergies:     Allergies  Allergen Reactions  . Hydrocodone Nausea And Vomiting  . Morphine Swelling    REACTION: swelling  . Pineapple Swelling    Throat swelling     Physical Exam:   Vitals  Blood pressure 136/75, pulse 72, temperature 97.9 F (36.6 C), temperature source Oral, resp. rate 17, height 5\' 3"  (1.6 m), weight 99.8 kg (220 lb),  SpO2 97 %.  1.  General: Appears in no acute distress  2. Psychiatric:  Intact judgement and  insight, awake alert, oriented x 3.  3. Neurologic: No focal neurological deficits, all cranial nerves intact.Strength 5/5 all 4 extremities, sensation intact all 4 extremities, plantars down going.  4. Eyes :  anicteric sclerae, moist conjunctivae with no lid lag. PERRLA.  5. ENMT:  Oropharynx clear with moist mucous membranes and good dentition  6. Neck:  supple, no cervical lymphadenopathy appriciated, No thyromegaly  7. Respiratory : Normal respiratory effort, good air movement bilaterally,clear to  auscultation bilaterally  8. Cardiovascular : RRR, no gallops, rubs or murmurs, no leg edema  9. Gastrointestinal:  Positive bowel sounds, abdomen soft, non-tender to palpation,no hepatosplenomegaly, no rigidity or guarding       10. Skin:  No cyanosis, normal texture and turgor, no rash, lesions or ulcers  11.Musculoskeletal:  Good muscle tone,  joints appear normal , no effusions,  normal range of motion    Data Review:    CBC Recent Labs  Lab 12/24/17 2135  WBC 9.7  HGB 13.1  HCT 40.2  PLT 302  MCV 95.5  MCH 31.1  MCHC 32.6  RDW 13.9   ------------------------------------------------------------------------------------------------------------------  Chemistries  Recent Labs  Lab 12/24/17 2135  NA 140  K 3.7  CL 101  CO2 28  GLUCOSE 105*  BUN 17  CREATININE 0.67  CALCIUM 9.4  AST 22  ALT 19  ALKPHOS 114  BILITOT 0.5   ------------------------------------------------------------------------------------------------------------------  ------------------------------------------------------------------------------------------------------------------ GFR: Estimated Creatinine Clearance: 70.6 mL/min (by C-G formula based on SCr of 0.67 mg/dL). Liver Function Tests: Recent Labs  Lab 12/24/17 2135  AST 22  ALT 19  ALKPHOS 114  BILITOT 0.5  PROT 7.0    ALBUMIN 3.9   No results for input(s): LIPASE, AMYLASE in the last 168 hours. No results for input(s): AMMONIA in the last 168 hours. Coagulation Profile: No results for input(s): INR, PROTIME in the last 168 hours. Cardiac Enzymes: Recent Labs  Lab 12/24/17 2135  TROPONINI <0.03   BNP (last 3 results) No results for input(s): PROBNP in the last 8760 hours. HbA1C: No results for input(s): HGBA1C in the last 72 hours. CBG: Recent Labs  Lab 12/24/17 2133  GLUCAP 109*  Lipid Profile: No results for input(s): CHOL, HDL, LDLCALC, TRIG, CHOLHDL, LDLDIRECT in the last 72 hours. Thyroid Function Tests: No results for input(s): TSH, T4TOTAL, FREET4, T3FREE, THYROIDAB in the last 72 hours. Anemia Panel: No results for input(s): VITAMINB12, FOLATE, FERRITIN, TIBC, IRON, RETICCTPCT in the last 72 hours.  --------------------------------------------------------------------------------------------------------------- Urine analysis:    Component Value Date/Time   COLORURINE YELLOW 03/15/2015 2324   APPEARANCEUR CLEAR 03/15/2015 2324   LABSPEC 1.020 03/15/2015 2324   PHURINE 6.5 03/15/2015 2324   GLUCOSEU NEGATIVE 03/15/2015 2324   HGBUR NEGATIVE 03/15/2015 2324   HGBUR trace-intact 04/27/2009 1109   BILIRUBINUR NEGATIVE 03/15/2015 2324   KETONESUR NEGATIVE 03/15/2015 2324   PROTEINUR NEGATIVE 03/15/2015 2324   UROBILINOGEN 0.2 03/15/2015 2324   NITRITE NEGATIVE 03/15/2015 2324   LEUKOCYTESUR SMALL (A) 03/15/2015 2324      Imaging Results:    Dg Chest 2 View  Result Date: 12/24/2017 CLINICAL DATA:  Left sided chest pain. EXAM: CHEST - 2 VIEW COMPARISON:  Radiographs 09/02/2014 FINDINGS: The cardiomediastinal contours are normal. Atherosclerosis of the aortic arch. Pulmonary vasculature is normal. No consolidation, pleural effusion, or pneumothorax. No acute osseous abnormalities are seen. IMPRESSION: 1.  No acute pulmonary process. 2.  Aortic Atherosclerosis (ICD10-I70.0).  Electronically Signed   By: Jeb Levering M.D.   On: 12/24/2017 23:10    My personal review of EKG: Rhythm NSR   Assessment & Plan:    Active Problems:   Chest pain   1. Chest pain-Place  under observation, rule out ACS.  Will cycle troponin every 6 hours x3.  Chest pain has resolved at this time.  2. Leg pain-patient complains of left leg pain, mild calf tenderness but no swelling.  Will check d-dimer to rule out venous thrombi embolism  3. Hypertension-continue home medications HCTZ, lisinopril, propranolol, amlodipine.  4. ?  CAD-patient has all the reports from [place], I have asked patient has been to get those reports in a.m.  Consider evaluating the reports and cardiology consultation as deemed necessary.     DVT Prophylaxis-   Lovenox  AM Labs Ordered, also please review Full Orders  Family Communication: Admission, patients condition and plan of care including tests being ordered have been discussed with the patient and her husband at bedside who indicate understanding and agree with the plan and Code Status.  Code Status: Full code  Admission status: Inpatient  Time spent in minutes : 60 minutes   Oswald Hillock M.D on 12/25/2017 at 12:28 AM  Between 7am to 7pm - Pager - [phone]. After 7pm go to [email or web address] - password [email or web address]  [facility] - Office  [phone]

## 2017-12-25 NOTE — Consult Note (Signed)
Cardiology Consultation:   Patient ID: Kendra [name]; [id number]; Feb 09, 1946   Admit date: 12/24/2017 Date of Consult: 12/25/2017  Primary Care Provider: Rosita Fire, MD Primary Cardiologist: New (Dr. Bronson Ing)    Patient Profile:   Kendra [name] is a 72 y.o. female with a hx of  who is being seen today for the evaluation of hypertension and hyperlipidemia at the request of Dr. Carles Collet.  History of Present Illness:   Ms. Kendra [name] is a 72 year old woman with a history of hypertension and hyperlipidemia who presented to the ED at [facility] with chest pain on 12/24/2017.  She is the only one present in the room during the time of my evaluation and she speaks broken [place].  I reviewed the electronic medical record.  She apparently been sitting in church and began experiencing retrosternal chest pain.  She tells me that it radiated into her jaw and left arm.  She also told me that she was in [place] about a month ago.  She said she was hospitalized for chest pain.  The hospitalist notes mention a nuclear stress test but she is unable to confirm this.  I asked about cardiac catheterization and she denies having had one before.  She currently denies chest pain.  She tells me that her husband has left to get the records at home from [place] with respect to hospitalizations and any potential cardiac testing that was completed.  D-dimer was mildly elevated at 0.98.  Troponins have been normal.  Conference of metabolic panel was unremarkable.  Interestingly, she is complaining of left leg pain.  However, lower extremity venous Dopplers demonstrate right calf peroneal DVT with no propagation into the popliteal or femoral veins, and no evidence for left lower extremity DVT.  CT angiography of the chest demonstrated no evidence of pulmonary embolism.  It did demonstrate diffuse coronary artery calcifications with mild calcification along the aortic arch.  I personally reviewed  the ECG performed on 12/24/2017 at 11/07/10 which demonstrated sinus rhythm with no acute ST segment or T wave abnormality's.  Prior ECGs performed that day demonstrated sinus rhythm with excessive baseline artifact.   Family history: 2 brothers have undergone cardiac surgery (uncertain type).  Past Medical History:  Diagnosis Date  . Arthritis   . Chronic back pain   . Conversion disorder    caused by stress of daughter's death in 11-06-2009  . Depression   . GERD (gastroesophageal reflux disease)   . Hypercholesterolemia   . Hypertension   . Kidney stone   . Migraine   . Panic attacks    daily, worse the 15th of each month  . Thyroid disease     Past Surgical History:  Procedure Laterality Date  . ABDOMINAL HYSTERECTOMY    . APPENDECTOMY    . CHOLECYSTECTOMY    . CYSTOSCOPY    . THYROID SURGERY         Inpatient Medications: Scheduled Meds: . amLODipine  10 mg Oral Daily  . atorvastatin  40 mg Oral q1800  . darifenacin  7.5 mg Oral Daily  . enoxaparin (LOVENOX) injection  0.5 mg/kg Subcutaneous Q24H  . fluorometholone  1 drop Left Eye Daily  . gabapentin  300 mg Oral Daily  . hydrochlorothiazide  12.5 mg Oral Daily  . lisinopril  40 mg Oral Daily  . pantoprazole  40 mg Oral Daily  . propranolol  20 mg Oral QHS  . risperiDONE  0.5 mg Oral QHS  . sertraline  100 mg Oral QHS   Continuous Infusions: . sodium chloride 10 mL/hr at 12/25/17 0411   PRN Meds: albuterol, nitroGLYCERIN, ondansetron **OR** ondansetron (ZOFRAN) IV  Allergies:    Allergies  Allergen Reactions  . Hydrocodone Nausea And Vomiting  . Morphine Swelling    REACTION: swelling  . Pineapple Swelling    Throat swelling    Social History:   Social History   Socioeconomic History  . Marital status: Married    Spouse name: Not on file  . Number of children: Not on file  . Years of education: Not on file  . Highest education level: Not on file  Occupational History  . Not on file  Social Needs    . Financial resource strain: Not on file  . Food insecurity:    Worry: Not on file    Inability: Not on file  . Transportation needs:    Medical: Not on file    Non-medical: Not on file  Tobacco Use  . Smoking status: Never Smoker  . Smokeless tobacco: Never Used  Substance and Sexual Activity  . Alcohol use: No  . Drug use: No  . Sexual activity: Never    Birth control/protection: None  Lifestyle  . Physical activity:    Days per week: Not on file    Minutes per session: Not on file  . Stress: Not on file  Relationships  . Social connections:    Talks on phone: Not on file    Gets together: Not on file    Attends religious service: Not on file    Active member of club or organization: Not on file    Attends meetings of clubs or organizations: Not on file    Relationship status: Not on file  . Intimate partner violence:    Fear of current or ex partner: Not on file    Emotionally abused: Not on file    Physically abused: Not on file    Forced sexual activity: Not on file  Other Topics Concern  . Not on file  Social History Narrative  . Not on file    Family History:   2 brothers have undergone cardiac surgery (uncertain type)  ROS:  Please see the history of present illness.   All other ROS reviewed and negative.     Physical Exam/Data:   Vitals:   12/25/17 0030 12/25/17 0100 12/25/17 0201 12/25/17 1100  BP: (!) 141/95 (!) 148/72 (!) 147/82 132/72  Pulse: 95 88 87   Resp: (!) 31 (!) 21 19   Temp:   97.9 F (36.6 C)   TempSrc:   Oral   SpO2: 97% 98% 96%   Weight:      Height:       No intake or output data in the 24 hours ending 12/25/17 1132 Filed Weights   12/24/17 2103 12/24/17 2125  Weight: 220 lb (99.8 kg) 220 lb (99.8 kg)   Body mass index is 38.97 kg/m.  General:  Well nourished, well developed, in no acute distress HEENT: normal Lymph: no adenopathy Neck: no JVD Endocrine:  No thryomegaly Cardiac:  normal S1, S2; RRR; no murmur  Lungs:   clear to auscultation bilaterally, no wheezing, rhonchi or rales  Abd: soft, nontender, no hepatomegaly  Ext: Right leg swelling Musculoskeletal:  No deformities, BUE and BLE strength normal and equal Skin: warm and dry  Neuro:  CNs 2-12 intact, no focal abnormalities noted Psych:  Normal affect   EKG:  The EKG  was personally reviewed and demonstrates: See above Telemetry:  Telemetry was personally reviewed and demonstrates: Sinus rhythm  Relevant CV Studies: Echocardiogram has been ordered and is pending.  Husband has left to obtain all cardiac testing performed in [place].  Laboratory Data:  Chemistry Recent Labs  Lab 12/24/17 2135 12/25/17 0246  NA 140 141  K 3.7 4.0  CL 101 102  CO2 28 32  GLUCOSE 105* 128*  BUN 17 15  CREATININE 0.67 0.64  CALCIUM 9.4 9.2  GFRNONAA >60 >60  GFRAA >60 >60  ANIONGAP 11 7    Recent Labs  Lab 12/24/17 2135 12/25/17 0246  PROT 7.0 6.5  ALBUMIN 3.9 3.6  AST 22 21  ALT 19 18  ALKPHOS 114 107  BILITOT 0.5 0.7   Hematology Recent Labs  Lab 12/24/17 2135 12/25/17 0246  WBC 9.7 10.8*  RBC 4.21 4.06  HGB 13.1 12.7  HCT 40.2 39.1  MCV 95.5 96.3  MCH 31.1 31.3  MCHC 32.6 32.5  RDW 13.9 13.9  PLT 302 285   Cardiac Enzymes Recent Labs  Lab 12/24/17 2135 12/25/17 0246 12/25/17 0854  TROPONINI <0.03 <0.03 <0.03   No results for input(s): TROPIPOC in the last 168 hours.  BNPNo results for input(s): BNP, PROBNP in the last 168 hours.  DDimer  Recent Labs  Lab 12/24/17 2135  DDIMER 0.98*    Radiology/Studies:  Dg Chest 2 View  Result Date: 12/24/2017 CLINICAL DATA:  Left sided chest pain. EXAM: CHEST - 2 VIEW COMPARISON:  Radiographs 09/02/2014 FINDINGS: The cardiomediastinal contours are normal. Atherosclerosis of the aortic arch. Pulmonary vasculature is normal. No consolidation, pleural effusion, or pneumothorax. No acute osseous abnormalities are seen. IMPRESSION: 1.  No acute pulmonary process. 2.  Aortic  Atherosclerosis (ICD10-I70.0). Electronically Signed   By: Jeb Levering M.D.   On: 12/24/2017 23:10   Ct Angio Chest Pe W Or Wo Contrast  Result Date: 12/25/2017 CLINICAL DATA:  Acute onset of shortness of breath and generalized chest pain, radiating down the left arm. EXAM: CT ANGIOGRAPHY CHEST WITH CONTRAST TECHNIQUE: Multidetector CT imaging of the chest was performed using the standard protocol during bolus administration of intravenous contrast. Multiplanar CT image reconstructions and MIPs were obtained to evaluate the vascular anatomy. CONTRAST:  112mL ISOVUE-370 IOPAMIDOL (ISOVUE-370) INJECTION 76% COMPARISON:  Chest radiograph performed 12/24/2017 FINDINGS: Cardiovascular:  There is no evidence of pulmonary embolus. The heart is normal in size. Diffuse coronary artery calcifications are seen. Mild calcification is noted along the aortic arch. The great vessels are grossly unremarkable. Mediastinum/Nodes: The mediastinum is otherwise unremarkable. No mediastinal lymphadenopathy is seen. No pericardial effusion is identified. The visualized portions of the thyroid gland are unremarkable. No axillary lymphadenopathy is seen. Lungs/Pleura: The lungs are essentially clear bilaterally. No focal consolidation, pleural effusion or pneumothorax is seen. No masses are identified. Upper Abdomen: The visualized portions of the liver and spleen are unremarkable. The patient is status post cholecystectomy, with clips noted at the gallbladder fossa. The visualized portions of the pancreas and adrenal glands are within normal limits. Musculoskeletal: No acute osseous abnormalities are identified. Anterior bridging osteophytes are noted along the thoracic spine. The visualized musculature is unremarkable in appearance. Review of the MIP images confirms the above findings. IMPRESSION: 1. No evidence of pulmonary embolus. 2. Lungs clear bilaterally. 3. Diffuse coronary artery calcifications seen. Electronically Signed    By: Garald Balding M.D.   On: 12/25/2017 03:16   US Venous Img Lower Bilateral  Result Date:  12/25/2017 CLINICAL DATA:  BILATERAL LOWER EXTREMITY PAIN AND SHORTNESS OF BREATH EXAM: BILATERAL LOWER EXTREMITY VENOUS DOPPLER ULTRASOUND TECHNIQUE: Gray-scale sonography with graded compression, as well as color Doppler and duplex ultrasound were performed to evaluate the lower extremity deep venous systems from the level of the common femoral vein and including the common femoral, femoral, profunda femoral, popliteal and calf veins including the posterior tibial, peroneal and gastrocnemius veins when visible. The superficial great saphenous vein was also interrogated. Spectral Doppler was utilized to evaluate flow at rest and with distal augmentation maneuvers in the common femoral, femoral and popliteal veins. COMPARISON:  None. FINDINGS: RIGHT LOWER EXTREMITY Common Femoral Vein: No evidence of thrombus. Normal compressibility, respiratory phasicity and response to augmentation. Saphenofemoral Junction: No evidence of thrombus. Normal compressibility and flow on color Doppler imaging. Profunda Femoral Vein: No evidence of thrombus. Normal compressibility and flow on color Doppler imaging. Femoral Vein: No evidence of thrombus. Normal compressibility, respiratory phasicity and response to augmentation. Popliteal Vein: No evidence of thrombus. Normal compressibility, respiratory phasicity and response to augmentation. Calf Veins: RIGHT PERONEAL CALF VEIN DEMONSTRATES HYPOECHOIC INTRALUMINAL THROMBUS AND APPEARS NONCOMPRESSIBLE COMPATIBLE WITH CALF REGION DVT. POSTERIOR TIBIAL VEIN APPEARS PATENT. Superficial Great Saphenous Vein: No evidence of thrombus. Normal compressibility. Venous Reflux:  None. Other Findings:  None. LEFT LOWER EXTREMITY Common Femoral Vein: No evidence of thrombus. Normal compressibility, respiratory phasicity and response to augmentation. Saphenofemoral Junction: No evidence of thrombus.  Normal compressibility and flow on color Doppler imaging. Profunda Femoral Vein: No evidence of thrombus. Normal compressibility and flow on color Doppler imaging. Femoral Vein: No evidence of thrombus. Normal compressibility, respiratory phasicity and response to augmentation. Popliteal Vein: No evidence of thrombus. Normal compressibility, respiratory phasicity and response to augmentation. Calf Veins: No evidence of thrombus. Normal compressibility and flow on color Doppler imaging. Superficial Great Saphenous Vein: No evidence of thrombus. Normal compressibility. Venous Reflux:  None. Other Findings:  None. IMPRESSION: Positive exam for right calf region peroneal DVT. No propagation into the popliteal or femoral veins. Negative for left lower extremity DVT. Electronically Signed   By: Jerilynn Mages.  Shick M.D.   On: 12/25/2017 10:54    Assessment and Plan:   1. Chest pain: Her symptoms are suspicious for an ischemic etiology. She has 2 brothers who have undergone some form of cardiac surgery.  Her husband is left to obtain hospitalization records from [place].  She denies having undergone previous cardiac catheterization.  There were diffuse coronary calcification seen on chest CT as noted above.  She is currently on propranolol 20 mg nightly and Lipitor 40 mg daily.  I will start aspirin 81 mg daily.  I will also start IV heparin as I think she would benefit from cardiac catheterization.  We will plan for transfer to [facility] (likely on 6/6 to hospitalist service) and likely coronary angiography on 12/26/2017.  2. Hypertension: BP controlled. No changes to therapy.  3. Right leg DVT: As coronary angiography will likely be performed tomorrow, I will start IV heparin.  4. Hyperlipidemia: On Lipitor 40 mg. Lipid panel is pending.   For questions or updates, please contact [place] Please consult [email or web address] for contact info under Cardiology/STEMI.   Signed, Kate Sable, MD  12/25/2017 11:32  AM

## 2017-12-25 NOTE — Progress Notes (Signed)
Patient c/o chest pain a 8 out of 10, BP 115/77 pulse 89.  MD aware, resp called to get EKG and nitrostat given sl. Patient states pain free 5 minutes post nitrostat, BP 99/63,pulse 90.

## 2017-12-25 NOTE — H&P (View-Only) (Signed)
Cardiology Consultation:   Patient ID: [id number]; [id number]; Jun 08, 1946   Admit date: 12/24/2017 Date of Consult: 12/25/2017  Primary Care Provider: Rosita Fire, MD Primary Cardiologist: New (Dr. Bronson Ing)    Patient Profile:   Kendra [name] is a 72 y.o. female with a hx of  who is being seen today for the evaluation of hypertension and hyperlipidemia at the request of Dr. Carles Collet.  History of Present Illness:   Kendra [name] is a 72 year old woman with a history of hypertension and hyperlipidemia who presented to the ED at [facility] with chest pain on 12/24/2017.  She is the only one present in the room during the time of my evaluation and she speaks broken [place].  I reviewed the electronic medical record.  She apparently been sitting in church and began experiencing retrosternal chest pain.  She tells me that it radiated into her jaw and left arm.  She also told me that she was in [place] about a month ago.  She said she was hospitalized for chest pain.  The hospitalist notes mention a nuclear stress test but she is unable to confirm this.  I asked about cardiac catheterization and she denies having had one before.  She currently denies chest pain.  She tells me that her husband has left to get the records at home from [place] with respect to hospitalizations and any potential cardiac testing that was completed.  D-dimer was mildly elevated at 0.98.  Troponins have been normal.  Conference of metabolic panel was unremarkable.  Interestingly, she is complaining of left leg pain.  However, lower extremity venous Dopplers demonstrate right calf peroneal DVT with no propagation into the popliteal or femoral veins, and no evidence for left lower extremity DVT.  CT angiography of the chest demonstrated no evidence of pulmonary embolism.  It did demonstrate diffuse coronary artery calcifications with mild calcification along the aortic arch.  I personally reviewed  the ECG performed on 12/24/2017 at 2110-11-18 which demonstrated sinus rhythm with no acute ST segment or T wave abnormality's.  Prior ECGs performed that day demonstrated sinus rhythm with excessive baseline artifact.   Family history: 2 brothers have undergone cardiac surgery (uncertain type).  Past Medical History:  Diagnosis Date  . Arthritis   . Chronic back pain   . Conversion disorder    caused by stress of daughter's death in 11-17-09  . Depression   . GERD (gastroesophageal reflux disease)   . Hypercholesterolemia   . Hypertension   . Kidney stone   . Migraine   . Panic attacks    daily, worse the 15th of each month  . Thyroid disease     Past Surgical History:  Procedure Laterality Date  . ABDOMINAL HYSTERECTOMY    . APPENDECTOMY    . CHOLECYSTECTOMY    . CYSTOSCOPY    . THYROID SURGERY         Inpatient Medications: Scheduled Meds: . amLODipine  10 mg Oral Daily  . atorvastatin  40 mg Oral q1800  . darifenacin  7.5 mg Oral Daily  . enoxaparin (LOVENOX) injection  0.5 mg/kg Subcutaneous Q24H  . fluorometholone  1 drop Left Eye Daily  . gabapentin  300 mg Oral Daily  . hydrochlorothiazide  12.5 mg Oral Daily  . lisinopril  40 mg Oral Daily  . pantoprazole  40 mg Oral Daily  . propranolol  20 mg Oral QHS  . risperiDONE  0.5 mg Oral QHS  . sertraline  100 mg Oral QHS   Continuous Infusions: . sodium chloride 10 mL/hr at 12/25/17 0411   PRN Meds: albuterol, nitroGLYCERIN, ondansetron **OR** ondansetron (ZOFRAN) IV  Allergies:    Allergies  Allergen Reactions  . Hydrocodone Nausea And Vomiting  . Morphine Swelling    REACTION: swelling  . Pineapple Swelling    Throat swelling    Social History:   Social History   Socioeconomic History  . Marital status: Married    Spouse name: Not on file  . Number of children: Not on file  . Years of education: Not on file  . Highest education level: Not on file  Occupational History  . Not on file  Social Needs    . Financial resource strain: Not on file  . Food insecurity:    Worry: Not on file    Inability: Not on file  . Transportation needs:    Medical: Not on file    Non-medical: Not on file  Tobacco Use  . Smoking status: Never Smoker  . Smokeless tobacco: Never Used  Substance and Sexual Activity  . Alcohol use: No  . Drug use: No  . Sexual activity: Never    Birth control/protection: None  Lifestyle  . Physical activity:    Days per week: Not on file    Minutes per session: Not on file  . Stress: Not on file  Relationships  . Social connections:    Talks on phone: Not on file    Gets together: Not on file    Attends religious service: Not on file    Active member of club or organization: Not on file    Attends meetings of clubs or organizations: Not on file    Relationship status: Not on file  . Intimate partner violence:    Fear of current or ex partner: Not on file    Emotionally abused: Not on file    Physically abused: Not on file    Forced sexual activity: Not on file  Other Topics Concern  . Not on file  Social History Narrative  . Not on file    Family History:   2 brothers have undergone cardiac surgery (uncertain type)  ROS:  Please see the history of present illness.   All other ROS reviewed and negative.     Physical Exam/Data:   Vitals:   12/25/17 0030 12/25/17 0100 12/25/17 0201 12/25/17 1100  BP: (!) 141/95 (!) 148/72 (!) 147/82 132/72  Pulse: 95 88 87   Resp: (!) 31 (!) 21 19   Temp:   97.9 F (36.6 C)   TempSrc:   Oral   SpO2: 97% 98% 96%   Weight:      Height:       No intake or output data in the 24 hours ending 12/25/17 1132 Filed Weights   12/24/17 2103 12/24/17 2125  Weight: 220 lb (99.8 kg) 220 lb (99.8 kg)   Body mass index is 38.97 kg/m.  General:  Well nourished, well developed, in no acute distress HEENT: normal Lymph: no adenopathy Neck: no JVD Endocrine:  No thryomegaly Cardiac:  normal S1, S2; RRR; no murmur  Lungs:   clear to auscultation bilaterally, no wheezing, rhonchi or rales  Abd: soft, nontender, no hepatomegaly  Ext: Right leg swelling Musculoskeletal:  No deformities, BUE and BLE strength normal and equal Skin: warm and dry  Neuro:  CNs 2-12 intact, no focal abnormalities noted Psych:  Normal affect   EKG:  The EKG  was personally reviewed and demonstrates: See above Telemetry:  Telemetry was personally reviewed and demonstrates: Sinus rhythm  Relevant CV Studies: Echocardiogram has been ordered and is pending.  Husband has left to obtain all cardiac testing performed in [place].  Laboratory Data:  Chemistry Recent Labs  Lab 12/24/17 2135 12/25/17 0246  NA 140 141  K 3.7 4.0  CL 101 102  CO2 28 32  GLUCOSE 105* 128*  BUN 17 15  CREATININE 0.67 0.64  CALCIUM 9.4 9.2  GFRNONAA >60 >60  GFRAA >60 >60  ANIONGAP 11 7    Recent Labs  Lab 12/24/17 2135 12/25/17 0246  PROT 7.0 6.5  ALBUMIN 3.9 3.6  AST 22 21  ALT 19 18  ALKPHOS 114 107  BILITOT 0.5 0.7   Hematology Recent Labs  Lab 12/24/17 2135 12/25/17 0246  WBC 9.7 10.8*  RBC 4.21 4.06  HGB 13.1 12.7  HCT 40.2 39.1  MCV 95.5 96.3  MCH 31.1 31.3  MCHC 32.6 32.5  RDW 13.9 13.9  PLT 302 285   Cardiac Enzymes Recent Labs  Lab 12/24/17 2135 12/25/17 0246 12/25/17 0854  TROPONINI <0.03 <0.03 <0.03   No results for input(s): TROPIPOC in the last 168 hours.  BNPNo results for input(s): BNP, PROBNP in the last 168 hours.  DDimer  Recent Labs  Lab 12/24/17 2135  DDIMER 0.98*    Radiology/Studies:  Dg Chest 2 View  Result Date: 12/24/2017 CLINICAL DATA:  Left sided chest pain. EXAM: CHEST - 2 VIEW COMPARISON:  Radiographs 09/02/2014 FINDINGS: The cardiomediastinal contours are normal. Atherosclerosis of the aortic arch. Pulmonary vasculature is normal. No consolidation, pleural effusion, or pneumothorax. No acute osseous abnormalities are seen. IMPRESSION: 1.  No acute pulmonary process. 2.  Aortic  Atherosclerosis (ICD10-I70.0). Electronically Signed   By: Jeb Levering M.D.   On: 12/24/2017 23:10   Ct Angio Chest Pe W Or Wo Contrast  Result Date: 12/25/2017 CLINICAL DATA:  Acute onset of shortness of breath and generalized chest pain, radiating down the left arm. EXAM: CT ANGIOGRAPHY CHEST WITH CONTRAST TECHNIQUE: Multidetector CT imaging of the chest was performed using the standard protocol during bolus administration of intravenous contrast. Multiplanar CT image reconstructions and MIPs were obtained to evaluate the vascular anatomy. CONTRAST:  17mL ISOVUE-370 IOPAMIDOL (ISOVUE-370) INJECTION 76% COMPARISON:  Chest radiograph performed 12/24/2017 FINDINGS: Cardiovascular:  There is no evidence of pulmonary embolus. The heart is normal in size. Diffuse coronary artery calcifications are seen. Mild calcification is noted along the aortic arch. The great vessels are grossly unremarkable. Mediastinum/Nodes: The mediastinum is otherwise unremarkable. No mediastinal lymphadenopathy is seen. No pericardial effusion is identified. The visualized portions of the thyroid gland are unremarkable. No axillary lymphadenopathy is seen. Lungs/Pleura: The lungs are essentially clear bilaterally. No focal consolidation, pleural effusion or pneumothorax is seen. No masses are identified. Upper Abdomen: The visualized portions of the liver and spleen are unremarkable. The patient is status post cholecystectomy, with clips noted at the gallbladder fossa. The visualized portions of the pancreas and adrenal glands are within normal limits. Musculoskeletal: No acute osseous abnormalities are identified. Anterior bridging osteophytes are noted along the thoracic spine. The visualized musculature is unremarkable in appearance. Review of the MIP images confirms the above findings. IMPRESSION: 1. No evidence of pulmonary embolus. 2. Lungs clear bilaterally. 3. Diffuse coronary artery calcifications seen. Electronically Signed    By: Garald Balding M.D.   On: 12/25/2017 03:16   US Venous Img Lower Bilateral  Result Date:  12/25/2017 CLINICAL DATA:  BILATERAL LOWER EXTREMITY PAIN AND SHORTNESS OF BREATH EXAM: BILATERAL LOWER EXTREMITY VENOUS DOPPLER ULTRASOUND TECHNIQUE: Gray-scale sonography with graded compression, as well as color Doppler and duplex ultrasound were performed to evaluate the lower extremity deep venous systems from the level of the common femoral vein and including the common femoral, femoral, profunda femoral, popliteal and calf veins including the posterior tibial, peroneal and gastrocnemius veins when visible. The superficial great saphenous vein was also interrogated. Spectral Doppler was utilized to evaluate flow at rest and with distal augmentation maneuvers in the common femoral, femoral and popliteal veins. COMPARISON:  None. FINDINGS: RIGHT LOWER EXTREMITY Common Femoral Vein: No evidence of thrombus. Normal compressibility, respiratory phasicity and response to augmentation. Saphenofemoral Junction: No evidence of thrombus. Normal compressibility and flow on color Doppler imaging. Profunda Femoral Vein: No evidence of thrombus. Normal compressibility and flow on color Doppler imaging. Femoral Vein: No evidence of thrombus. Normal compressibility, respiratory phasicity and response to augmentation. Popliteal Vein: No evidence of thrombus. Normal compressibility, respiratory phasicity and response to augmentation. Calf Veins: RIGHT PERONEAL CALF VEIN DEMONSTRATES HYPOECHOIC INTRALUMINAL THROMBUS AND APPEARS NONCOMPRESSIBLE COMPATIBLE WITH CALF REGION DVT. POSTERIOR TIBIAL VEIN APPEARS PATENT. Superficial Great Saphenous Vein: No evidence of thrombus. Normal compressibility. Venous Reflux:  None. Other Findings:  None. LEFT LOWER EXTREMITY Common Femoral Vein: No evidence of thrombus. Normal compressibility, respiratory phasicity and response to augmentation. Saphenofemoral Junction: No evidence of thrombus.  Normal compressibility and flow on color Doppler imaging. Profunda Femoral Vein: No evidence of thrombus. Normal compressibility and flow on color Doppler imaging. Femoral Vein: No evidence of thrombus. Normal compressibility, respiratory phasicity and response to augmentation. Popliteal Vein: No evidence of thrombus. Normal compressibility, respiratory phasicity and response to augmentation. Calf Veins: No evidence of thrombus. Normal compressibility and flow on color Doppler imaging. Superficial Great Saphenous Vein: No evidence of thrombus. Normal compressibility. Venous Reflux:  None. Other Findings:  None. IMPRESSION: Positive exam for right calf region peroneal DVT. No propagation into the popliteal or femoral veins. Negative for left lower extremity DVT. Electronically Signed   By: Jerilynn Mages.  Shick M.D.   On: 12/25/2017 10:54    Assessment and Plan:   1. Chest pain: Her symptoms are suspicious for an ischemic etiology. She has 2 brothers who have undergone some form of cardiac surgery.  Her husband is left to obtain hospitalization records from [place].  She denies having undergone previous cardiac catheterization.  There were diffuse coronary calcification seen on chest CT as noted above.  She is currently on propranolol 20 mg nightly and Lipitor 40 mg daily.  I will start aspirin 81 mg daily.  I will also start IV heparin as I think she would benefit from cardiac catheterization.  We will plan for transfer to [facility] (likely on 6/6 to hospitalist service) and likely coronary angiography on 12/26/2017.  2. Hypertension: BP controlled. No changes to therapy.  3. Right leg DVT: As coronary angiography will likely be performed tomorrow, I will start IV heparin.  4. Hyperlipidemia: On Lipitor 40 mg. Lipid panel is pending.   For questions or updates, please contact [place] Please consult [email or web address] for contact info under Cardiology/STEMI.   Signed, Kate Sable, MD  12/25/2017 11:32  AM

## 2017-12-25 NOTE — Progress Notes (Signed)
CRITICAL VALUE ALERT  Critical Value:  PTT 200  Date & Time Notied:  12/25/2017 1341  Provider Notified: Dr. Carles Collet notified via text page  Orders Received/Actions taken: Awaiting response.

## 2017-12-25 NOTE — Progress Notes (Signed)
[facility] for Heparin Indication: DVT  Allergies  Allergen Reactions  . Hydrocodone Nausea And Vomiting  . Morphine Swelling    REACTION: swelling  . Pineapple Swelling    Throat swelling    Patient Measurements: Height: 5\' 3"  (160 cm) Weight: 220 lb (99.8 kg) IBW/kg (Calculated) : 52.4 HEPARIN DW (KG): 75.8   Vital Signs: Temp: 97.9 F (36.6 C) (06/05 0201) Temp Source: Oral (06/05 0201) BP: 132/72 (06/05 1100) Pulse Rate: 87 (06/05 0201)  Labs: Recent Labs    12/24/17 11/16/33 12/25/17 0246 12/25/17 0854  HGB 13.1 12.7  --   HCT 40.2 39.1  --   PLT 302 285  --   CREATININE 0.67 0.64  --   TROPONINI <0.03 <0.03 <0.03   Estimated Creatinine Clearance: 70.6 mL/min (by C-G formula based on SCr of 0.64 mg/dL).  Medical History: Past Medical History:  Diagnosis Date  . Arthritis   . Chronic back pain   . Conversion disorder    caused by stress of daughter's death in 2009/11/16  . Depression   . GERD (gastroesophageal reflux disease)   . Hypercholesterolemia   . Hypertension   . Kidney stone   . Migraine   . Panic attacks    daily, worse the 15th of each month  . Thyroid disease    Medications:  Medications Prior to Admission  Medication Sig Dispense Refill Last Dose  . amLODipine (NORVASC) 10 MG tablet Take 10 mg by mouth daily.   0 12/24/2017 at Unknown time  . atorvastatin (LIPITOR) 40 MG tablet Take 40 mg by mouth daily at 6 PM.   0 12/24/2017 at Unknown time  . fluorometholone (FML) 0.1 % ophthalmic suspension Place 1 drop into the left eye daily.  0 12/24/2017 at Unknown time  . gabapentin (NEURONTIN) 300 MG capsule Take 300 mg by mouth daily.   0 12/24/2017 at Unknown time  . hydrochlorothiazide (HYDRODIURIL) 12.5 MG tablet Take 12.5 mg by mouth daily.   0 12/24/2017 at Unknown time  . lisinopril (PRINIVIL,ZESTRIL) 40 MG tablet Take 40 mg by mouth daily.   0 12/24/2017 at Unknown time  . OPTIMAL-D 61607 units capsule Take 50,000  Units by mouth once a week.   0 Past Week at Unknown time  . pantoprazole (PROTONIX) 20 MG tablet Take 20 mg by mouth daily.     12/24/2017 at Unknown time  . propranolol (INDERAL) 20 MG tablet Take 20 mg by mouth at bedtime.    12/24/2017 at Unknown time  . risperiDONE (RISPERDAL) 0.5 MG tablet Take 0.5 mg by mouth at bedtime.   2 12/24/2017 at Unknown time  . sertraline (ZOLOFT) 100 MG tablet Take 100 mg by mouth at bedtime.   2 12/24/2017 at Unknown time  . VENTOLIN HFA 108 (90 Base) MCG/ACT inhaler Inhale 1-2 puffs into the lungs every 6 (six) hours as needed for wheezing or shortness of breath.   0 unknown  . VESICARE 5 MG tablet Take 5 mg by mouth daily.   0 12/24/2017 at Unknown time   Assessment: Okay for Protocol, baseline anticoag labs pending.  No bleeding noted.  Tx to [facility] planned for cardiac cath.  Received Lovenox 0.5mg /kg @ 0533 this morning.  Goal of Therapy:  Heparin level 0.3-0.7 units/ml Monitor platelets by anticoagulation protocol: Yes   Plan:  Give 4000 units bolus x 1 Start heparin infusion at 1050 units/hr Check anti-Xa level in 6-8 hours and daily while on heparin Continue  to monitor H&H and platelets  Pricilla Larsson 12/25/2017,12:51 PM

## 2017-12-25 NOTE — Progress Notes (Signed)
MD paged about repeated PTT result of 172.

## 2017-12-25 NOTE — Progress Notes (Signed)
*  PRELIMINARY RESULTS* Echocardiogram 2D Echocardiogram has been performed.  Leavy Cella 12/25/2017, 1:35 PM

## 2017-12-26 ENCOUNTER — Encounter (HOSPITAL_COMMUNITY): Payer: Self-pay | Admitting: Cardiovascular Disease

## 2017-12-26 ENCOUNTER — Encounter (HOSPITAL_COMMUNITY): Payer: Self-pay

## 2017-12-26 ENCOUNTER — Ambulatory Visit (HOSPITAL_COMMUNITY)
Admission: EM | Disposition: A | Discharge status: Home / Self Care | Payer: Self-pay | Source: Home / Self Care | Attending: Emergency Medicine

## 2017-12-26 DIAGNOSIS — Z86718 Personal history of other venous thrombosis and embolism: Secondary | ICD-10-CM | POA: Diagnosis present

## 2017-12-26 DIAGNOSIS — I200 Unstable angina: Secondary | ICD-10-CM

## 2017-12-26 DIAGNOSIS — I25110 Atherosclerotic heart disease of native coronary artery with unstable angina pectoris: Secondary | ICD-10-CM

## 2017-12-26 DIAGNOSIS — I82409 Acute embolism and thrombosis of unspecified deep veins of unspecified lower extremity: Secondary | ICD-10-CM | POA: Diagnosis present

## 2017-12-26 DIAGNOSIS — R079 Chest pain, unspecified: Secondary | ICD-10-CM | POA: Diagnosis not present

## 2017-12-26 DIAGNOSIS — I82401 Acute embolism and thrombosis of unspecified deep veins of right lower extremity: Secondary | ICD-10-CM

## 2017-12-26 DIAGNOSIS — E782 Mixed hyperlipidemia: Secondary | ICD-10-CM | POA: Diagnosis not present

## 2017-12-26 DIAGNOSIS — I10 Essential (primary) hypertension: Secondary | ICD-10-CM | POA: Diagnosis not present

## 2017-12-26 DIAGNOSIS — I2699 Other pulmonary embolism without acute cor pulmonale: Secondary | ICD-10-CM

## 2017-12-26 HISTORY — PX: LEFT HEART CATH AND CORONARY ANGIOGRAPHY: CATH118249

## 2017-12-26 LAB — CBC
HEMATOCRIT: 40.6 % (ref 36.0–46.0)
HEMATOCRIT: 40.9 % (ref 36.0–46.0)
HEMOGLOBIN: 13.1 g/dL (ref 12.0–15.0)
Hemoglobin: 13.2 g/dL (ref 12.0–15.0)
MCH: 31.3 pg (ref 26.0–34.0)
MCH: 31.1 pg (ref 26.0–34.0)
MCHC: 32.3 g/dL (ref 30.0–36.0)
MCHC: 32.3 g/dL (ref 30.0–36.0)
MCV: 96.9 fL (ref 78.0–100.0)
MCV: 96.2 fL (ref 78.0–100.0)
PLATELETS: 321 10*3/uL (ref 150–400)
Platelets: 300 10*3/uL (ref 150–400)
RBC: 4.19 MIL/uL (ref 3.87–5.11)
RBC: 4.25 MIL/uL (ref 3.87–5.11)
RDW: 13.9 % (ref 11.5–15.5)
RDW: 13.7 % (ref 11.5–15.5)
WBC: 8.6 10*3/uL (ref 4.0–10.5)
WBC: 8.3 10*3/uL (ref 4.0–10.5)

## 2017-12-26 LAB — HEPARIN LEVEL (UNFRACTIONATED): Heparin Unfractionated: 0.78 IU/mL — ABNORMAL HIGH (ref 0.30–0.70)

## 2017-12-26 SURGERY — LEFT HEART CATH AND CORONARY ANGIOGRAPHY
Anesthesia: LOCAL

## 2017-12-26 MED ORDER — SODIUM CHLORIDE 0.9 % IV SOLN: INTRAVENOUS | Status: DC

## 2017-12-26 MED ORDER — ACETAMINOPHEN 325 MG PO TABS
650.0000 mg | ORAL_TABLET | ORAL | Status: DC | PRN
  Administered 2017-12-26: 650 mg via ORAL
  Filled 2017-12-26: qty 2

## 2017-12-26 MED ORDER — RIVAROXABAN 15 MG PO TABS: 15.0000 mg | ORAL_TABLET | Freq: Every day | ORAL | Status: DC

## 2017-12-26 MED ORDER — HEPARIN (PORCINE) IN NACL 1000-0.9 UT/500ML-% IV SOLN
INTRAVENOUS | Status: AC
  Filled 2017-12-26: qty 1000

## 2017-12-26 MED ORDER — IOHEXOL 350 MG/ML SOLN
INTRAVENOUS | Status: DC | PRN
  Administered 2017-12-26: 40 mL

## 2017-12-26 MED ORDER — SODIUM CHLORIDE 0.9% FLUSH
3.0000 mL | Freq: Two times a day (BID) | INTRAVENOUS | Status: DC
  Administered 2017-12-26 – 2017-12-27 (×2): 3 mL via INTRAVENOUS

## 2017-12-26 MED ORDER — RIVAROXABAN 15 MG PO TABS
15.0000 mg | ORAL_TABLET | Freq: Two times a day (BID) | ORAL | Status: DC
  Administered 2017-12-26 – 2017-12-27 (×2): 15 mg via ORAL
  Filled 2017-12-26: qty 1

## 2017-12-26 MED ORDER — TRAMADOL HCL 50 MG PO TABS
50.0000 mg | ORAL_TABLET | Freq: Four times a day (QID) | ORAL | Status: DC | PRN
  Administered 2017-12-26: 50 mg via ORAL
  Filled 2017-12-26: qty 1

## 2017-12-26 MED ORDER — SODIUM CHLORIDE 0.9 % IV SOLN
250.0000 mL | INTRAVENOUS | Status: DC | PRN
Start: 2017-12-26 — End: 2017-12-27

## 2017-12-26 MED ORDER — SODIUM CHLORIDE 0.9 % IV SOLN
INTRAVENOUS | Status: AC
  Administered 2017-12-26: 11:00:00 via INTRAVENOUS

## 2017-12-26 MED ORDER — LIDOCAINE HCL (PF) 1 % IJ SOLN
INTRAMUSCULAR | Status: AC
  Filled 2017-12-26: qty 30

## 2017-12-26 MED ORDER — RIVAROXABAN 20 MG PO TABS: 20.0000 mg | ORAL_TABLET | Freq: Every day | ORAL | Status: DC

## 2017-12-26 MED ORDER — HEPARIN SODIUM (PORCINE) 1000 UNIT/ML IJ SOLN
INTRAMUSCULAR | Status: DC | PRN
  Administered 2017-12-26: 5000 [IU] via INTRAVENOUS

## 2017-12-26 MED ORDER — LIDOCAINE HCL (PF) 1 % IJ SOLN
INTRAMUSCULAR | Status: DC | PRN
  Administered 2017-12-26: 2 mL

## 2017-12-26 MED ORDER — SODIUM CHLORIDE 0.9% FLUSH: 3.0000 mL | Freq: Two times a day (BID) | INTRAVENOUS | Status: DC

## 2017-12-26 MED ORDER — ATORVASTATIN CALCIUM 80 MG PO TABS
80.0000 mg | ORAL_TABLET | Freq: Every day | ORAL | Status: DC
  Administered 2017-12-26: 80 mg via ORAL
  Filled 2017-12-26: qty 1

## 2017-12-26 MED ORDER — ONDANSETRON HCL 4 MG/2ML IJ SOLN: 4.0000 mg | Freq: Four times a day (QID) | INTRAMUSCULAR | Status: DC | PRN

## 2017-12-26 MED ORDER — SODIUM CHLORIDE 0.9% FLUSH: 3.0000 mL | INTRAVENOUS | Status: DC | PRN

## 2017-12-26 MED ORDER — VERAPAMIL HCL 2.5 MG/ML IV SOLN
INTRA_ARTERIAL | Status: DC | PRN
  Administered 2017-12-26: 7.5 mL via INTRA_ARTERIAL

## 2017-12-26 MED ORDER — SODIUM CHLORIDE 0.9 % IV SOLN: 250.0000 mL | INTRAVENOUS | Status: DC | PRN

## 2017-12-26 MED ORDER — RIVAROXABAN 15 MG PO TABS
15.0000 mg | ORAL_TABLET | Freq: Two times a day (BID) | ORAL | Status: DC
  Filled 2017-12-26: qty 1

## 2017-12-26 MED ORDER — VERAPAMIL HCL 2.5 MG/ML IV SOLN
INTRAVENOUS | Status: AC
  Filled 2017-12-26: qty 2

## 2017-12-26 MED ORDER — HEPARIN SODIUM (PORCINE) 1000 UNIT/ML IJ SOLN
INTRAMUSCULAR | Status: AC
  Filled 2017-12-26: qty 1

## 2017-12-26 MED ORDER — HEPARIN (PORCINE) IN NACL 2-0.9 UNITS/ML
INTRAMUSCULAR | Status: AC | PRN
  Administered 2017-12-26 (×2): 500 mL

## 2017-12-26 MED ORDER — ASPIRIN 81 MG PO CHEW
81.0000 mg | CHEWABLE_TABLET | Freq: Every day | ORAL | Status: DC
  Administered 2017-12-27: 81 mg via ORAL
  Filled 2017-12-26: qty 1

## 2017-12-26 SURGICAL SUPPLY — 10 items
CATH OPTITORQUE TIG 4.0 5F (CATHETERS) ×1 IMPLANT
DEVICE RAD COMP TR BAND LRG (VASCULAR PRODUCTS) ×1 IMPLANT
GLIDESHEATH SLEND A-KIT 6F 22G (SHEATH) ×1 IMPLANT
GUIDEWIRE INQWIRE 1.5J.035X260 (WIRE) IMPLANT
INQWIRE 1.5J .035X260CM (WIRE) ×2
KIT HEART LEFT (KITS) ×2 IMPLANT
PACK CARDIAC CATHETERIZATION (CUSTOM PROCEDURE TRAY) ×2 IMPLANT
TRANSDUCER W/STOPCOCK (MISCELLANEOUS) ×2 IMPLANT
TUBING CIL FLEX 10 FLL-RA (TUBING) ×2 IMPLANT
WIRE HI TORQ VERSACORE-J 145CM (WIRE) ×1 IMPLANT

## 2017-12-26 NOTE — Progress Notes (Addendum)
[facility]                                                                              Patient Demographics  Kendra [name], is a 72 y.o. female, DOB - [birth year], XHB:[id number]  Admit date - 12/24/2017   Admitting Physician Oswald Hillock, MD  Outpatient Primary MD for the patient is Rosita Fire, MD  Outpatient specialists:   LOS - 0  days   Medical records reviewed and are as summarized below:    Chief Complaint  Patient presents with  . Chest Pain       Brief summary   72 year old female with a history of depression, conversion disorder, hypertension, hyperlipidemia presented with chest pain that began on the afternoon of 12/24/2017 while they were at church sitting in the pews.This was associated with some shortness of breath with radiation of her pain to her jaw and left arm. The patient recently returned from [place] where she was hospitalized for chest pain. Apparently, the patient had a nuclear medicine stress test at that time, but was unsure of the exact results of the test, she never had a heart catheterization.  Patient was admitted for further work-up, cardiology was consulted, recommended transferred to [facility] for cardiac cath.  Doppler ultrasound of the lower extremity showed DVT RLE.  Assessment & Plan    Principal Problem:   Chest pain: Radiating to left jaw and arm, anginal equivalent -Troponins x4 neg -CT angiogram of the chest showed no evidence of PE, lungs clear bilaterally, diffuse coronary artery calcification -Continue aspirin, statin, beta-blocker.  Patient was started on IV heparin -2D echo showed EF of 60 to 65% with normal wall motion, grade 1 diastolic dysfunction - Cardiology was consulted at  [facility], seen by Dr. Bronson Ing, recommended coronary angiography on 6/6 and patient was transferred to [facility].p[facility] discussed with the Cath Lab  Active Problems:   DVT (deep venous thrombosis) ([place]) -Patient  complained of left leg swelling and pain, Doppler ultrasound of the lower extremity however showed right leg DVT, d/w sonographer confirmed with [facility], DVT was in RLE - CT angiogram of the chest showed no PE.  Regardless patient will be on anticoagulation, continue IV heparin drip for now, likely will be on NOAC for 3 months once cleared by cardiology - d/w cardiology, cath clean, placed on Xarelto for 3 months.  Once anticoagulation is completed, patient can start aspirin 81 mg daily    Essential hypertension -BP currently stable continue beta-blocker, amlodipine, lisinopril HCTZ    Mixed hyperlipidemia -Continue Lipitor, check lipid panel   Code Status: Full CODE STATUS DVT Prophylaxis: Heparin drip Family Communication: Discussed in detail with the patient, all imaging results, lab results explained to the patient and husband at the bedside   Disposition Plan: Awaiting cardiac cath  Time Spent in minutes 35 minutes  Procedures:  2D echo: EF 60 to 65% with grade 1 diastolic dysfunction  Consultants:   Cardiology  Antimicrobials:      Medications  Scheduled Meds: . amLODipine  10 mg Oral Daily  . aspirin  81 mg Oral Daily  . atorvastatin  40 mg Oral q1800  . darifenacin  7.5 mg Oral Daily  . fluorometholone  1 drop Left Eye Daily  . gabapentin  300 mg Oral Daily  . hydrochlorothiazide  12.5 mg Oral Daily  . lisinopril  40 mg Oral Daily  . pantoprazole  40 mg Oral Daily  . propranolol  20 mg Oral QHS  . risperiDONE  0.5 mg Oral QHS  . sertraline  100 mg Oral QHS   Continuous Infusions: . sodium chloride 10 mL/hr at 12/25/17 0411  . heparin 950 Units/hr (12/26/17 0718)   PRN Meds:.acetaminophen, albuterol, nitroGLYCERIN, ondansetron **OR** ondansetron (ZOFRAN) IV, traMADol   Antibiotics   Anti-infectives (From admission, onward)   None        Subjective:   Kendra [name] was seen and examined today.  Chest pain improving.  No shortness of breath.  Left leg  swelling and pain improving. Patient denies dizziness, abdominal pain, N/V/D/C, new weakness, numbess, tingling. No acute events overnight.    Objective:   Vitals:   12/26/17 0030 12/26/17 0611 12/26/17 0818 12/26/17 0846  BP: (!) 141/80 121/80 129/75 117/79  Pulse: 64 69 64 65  Resp: 18 18 18    Temp: 98 F (36.7 C) 97.8 F (36.6 C) 97.6 F (36.4 C)   TempSrc: Oral Oral Oral   SpO2: 98% 98% 97%   Weight:  101.3 kg (223 lb 6.4 oz)    Height:        Intake/Output Summary (Last 24 hours) at 12/26/2017 0909 Last data filed at 12/26/2017 0731 Gross per 24 hour  Intake 743.39 ml  Output 700 ml  Net 43.39 ml     Wt Readings from Last 3 Encounters:  12/26/17 101.3 kg (223 lb 6.4 oz)  03/15/15 98.4 kg (217 lb)  09/02/14 96.6 kg (213 lb)     Exam  General: Alert and oriented x 3, NAD  Eyes:  HEENT:  Atraumatic, normocephalic  Cardiovascular: S1 S2 auscultated, Regular rate and rhythm.  Respiratory: Clear to auscultation bilaterally, no wheezing, rales or rhonchi  Gastrointestinal: Soft, nontender, nondistended, + bowel sounds  Ext: trace pedal edema bilaterally  Neuro: no new deficits  Musculoskeletal: No digital cyanosis, clubbing  Skin: No rashes  Psych: Normal affect and demeanor, alert and oriented x3    Data Reviewed:  I have personally reviewed following labs and imaging studies  Micro Results No results found for this or any previous visit (from the past 240 hour(s)).  Radiology Reports Dg Chest 2 View  Result Date: 12/24/2017 CLINICAL DATA:  Left sided chest pain. EXAM: CHEST - 2 VIEW COMPARISON:  Radiographs 09/02/2014 FINDINGS: The cardiomediastinal contours are normal. Atherosclerosis of the aortic arch. Pulmonary vasculature is normal. No consolidation, pleural effusion, or pneumothorax. No acute osseous abnormalities are seen. IMPRESSION: 1.  No acute pulmonary process. 2.  Aortic Atherosclerosis (ICD10-I70.0). Electronically Signed   By: Jeb Levering M.D.   On: 12/24/2017 23:10   Ct Angio Chest Pe W Or Wo Contrast  Result Date: 12/25/2017 CLINICAL DATA:  Acute onset of shortness of breath and generalized chest pain, radiating down the left arm. EXAM: CT ANGIOGRAPHY CHEST WITH CONTRAST TECHNIQUE: Multidetector CT imaging of the chest was performed using the standard protocol during bolus administration of intravenous contrast. Multiplanar CT image reconstructions and MIPs were obtained to evaluate the vascular anatomy. CONTRAST:  165mL ISOVUE-370 IOPAMIDOL (ISOVUE-370) INJECTION 76% COMPARISON:  Chest radiograph performed 12/24/2017 FINDINGS: Cardiovascular:  There is no evidence of pulmonary embolus. The heart is  normal in size. Diffuse coronary artery calcifications are seen. Mild calcification is noted along the aortic arch. The great vessels are grossly unremarkable. Mediastinum/Nodes: The mediastinum is otherwise unremarkable. No mediastinal lymphadenopathy is seen. No pericardial effusion is identified. The visualized portions of the thyroid gland are unremarkable. No axillary lymphadenopathy is seen. Lungs/Pleura: The lungs are essentially clear bilaterally. No focal consolidation, pleural effusion or pneumothorax is seen. No masses are identified. Upper Abdomen: The visualized portions of the liver and spleen are unremarkable. The patient is status post cholecystectomy, with clips noted at the gallbladder fossa. The visualized portions of the pancreas and adrenal glands are within normal limits. Musculoskeletal: No acute osseous abnormalities are identified. Anterior bridging osteophytes are noted along the thoracic spine. The visualized musculature is unremarkable in appearance. Review of the MIP images confirms the above findings. IMPRESSION: 1. No evidence of pulmonary embolus. 2. Lungs clear bilaterally. 3. Diffuse coronary artery calcifications seen. Electronically Signed   By: Garald Balding M.D.   On: 12/25/2017 03:16   US Venous Img  Lower Bilateral  Result Date: 12/25/2017 CLINICAL DATA:  BILATERAL LOWER EXTREMITY PAIN AND SHORTNESS OF BREATH EXAM: BILATERAL LOWER EXTREMITY VENOUS DOPPLER ULTRASOUND TECHNIQUE: Gray-scale sonography with graded compression, as well as color Doppler and duplex ultrasound were performed to evaluate the lower extremity deep venous systems from the level of the common femoral vein and including the common femoral, femoral, profunda femoral, popliteal and calf veins including the posterior tibial, peroneal and gastrocnemius veins when visible. The superficial great saphenous vein was also interrogated. Spectral Doppler was utilized to evaluate flow at rest and with distal augmentation maneuvers in the common femoral, femoral and popliteal veins. COMPARISON:  None. FINDINGS: RIGHT LOWER EXTREMITY Common Femoral Vein: No evidence of thrombus. Normal compressibility, respiratory phasicity and response to augmentation. Saphenofemoral Junction: No evidence of thrombus. Normal compressibility and flow on color Doppler imaging. Profunda Femoral Vein: No evidence of thrombus. Normal compressibility and flow on color Doppler imaging. Femoral Vein: No evidence of thrombus. Normal compressibility, respiratory phasicity and response to augmentation. Popliteal Vein: No evidence of thrombus. Normal compressibility, respiratory phasicity and response to augmentation. Calf Veins: RIGHT PERONEAL CALF VEIN DEMONSTRATES HYPOECHOIC INTRALUMINAL THROMBUS AND APPEARS NONCOMPRESSIBLE COMPATIBLE WITH CALF REGION DVT. POSTERIOR TIBIAL VEIN APPEARS PATENT. Superficial Great Saphenous Vein: No evidence of thrombus. Normal compressibility. Venous Reflux:  None. Other Findings:  None. LEFT LOWER EXTREMITY Common Femoral Vein: No evidence of thrombus. Normal compressibility, respiratory phasicity and response to augmentation. Saphenofemoral Junction: No evidence of thrombus. Normal compressibility and flow on color Doppler imaging. Profunda  Femoral Vein: No evidence of thrombus. Normal compressibility and flow on color Doppler imaging. Femoral Vein: No evidence of thrombus. Normal compressibility, respiratory phasicity and response to augmentation. Popliteal Vein: No evidence of thrombus. Normal compressibility, respiratory phasicity and response to augmentation. Calf Veins: No evidence of thrombus. Normal compressibility and flow on color Doppler imaging. Superficial Great Saphenous Vein: No evidence of thrombus. Normal compressibility. Venous Reflux:  None. Other Findings:  None. IMPRESSION: Positive exam for right calf region peroneal DVT. No propagation into the popliteal or femoral veins. Negative for left lower extremity DVT. Electronically Signed   By: Jerilynn Mages.  Shick M.D.   On: 12/25/2017 10:54    Lab Data:  CBC: Recent Labs  Lab 12/24/17 2135 12/25/17 0246 12/26/17 0137 12/26/17 0341  WBC 9.7 10.8* 8.6 8.3  HGB 13.1 12.7 13.1 13.2  HCT 40.2 39.1 40.6 40.9  MCV 95.5 96.3 96.9 96.2  PLT 302 285  300 921   Basic Metabolic Panel: Recent Labs  Lab 12/24/17 2135 12/25/17 0246  NA 140 141  K 3.7 4.0  CL 101 102  CO2 28 32  GLUCOSE 105* 128*  BUN 17 15  CREATININE 0.67 0.64  CALCIUM 9.4 9.2   GFR: Estimated Creatinine Clearance: 71.2 mL/min (by C-G formula based on SCr of 0.64 mg/dL). Liver Function Tests: Recent Labs  Lab 12/24/17 2135 12/25/17 0246  AST 22 21  ALT 19 18  ALKPHOS 114 107  BILITOT 0.5 0.7  PROT 7.0 6.5  ALBUMIN 3.9 3.6   No results for input(s): LIPASE, AMYLASE in the last 168 hours. No results for input(s): AMMONIA in the last 168 hours. Coagulation Profile: Recent Labs  Lab 12/25/17 1319 12/25/17 1407  INR 1.10 1.09   Cardiac Enzymes: Recent Labs  Lab 12/24/17 2135 12/25/17 0246 12/25/17 0854 12/25/17 1319  TROPONINI <0.03 <0.03 <0.03 <0.03   BNP (last 3 results) No results for input(s): PROBNP in the last 8760 hours. HbA1C: No results for input(s): HGBA1C in the last 72  hours. CBG: Recent Labs  Lab 12/24/17 2133  GLUCAP 109*   Lipid Profile: No results for input(s): CHOL, HDL, LDLCALC, TRIG, CHOLHDL, LDLDIRECT in the last 72 hours. Thyroid Function Tests: No results for input(s): TSH, T4TOTAL, FREET4, T3FREE, THYROIDAB in the last 72 hours. Anemia Panel: No results for input(s): VITAMINB12, FOLATE, FERRITIN, TIBC, IRON, RETICCTPCT in the last 72 hours. Urine analysis:    Component Value Date/Time   COLORURINE YELLOW 03/15/2015 2324   APPEARANCEUR CLEAR 03/15/2015 2324   LABSPEC 1.020 03/15/2015 2324   PHURINE 6.5 03/15/2015 2324   GLUCOSEU NEGATIVE 03/15/2015 2324   HGBUR NEGATIVE 03/15/2015 2324   HGBUR trace-intact 04/27/2009 1109   BILIRUBINUR NEGATIVE 03/15/2015 2324   KETONESUR NEGATIVE 03/15/2015 2324   PROTEINUR NEGATIVE 03/15/2015 2324   UROBILINOGEN 0.2 03/15/2015 2324   NITRITE NEGATIVE 03/15/2015 2324   LEUKOCYTESUR SMALL (A) 03/15/2015 2324     Dhiya Smits M.D. [facility] 12/26/2017, 9:09 AM  Pager: [phone] Between 7am to 7pm - call Pager - [phone]  After 7pm go to [email or web address] - password [email or web address]  Call night coverage person covering after 7pm

## 2017-12-26 NOTE — Progress Notes (Addendum)
Pt has been NPO since 0100 due to possible cath today. Cath video shown to patient and husband. Labs and EKG are within date. Will continue to monitor.

## 2017-12-26 NOTE — Progress Notes (Signed)
RN paged MD regarding if pt will be discharged today. No new orders.

## 2017-12-26 NOTE — Progress Notes (Signed)
RN rounded on pt. Pt and husband state they do not need anything at this time. 

## 2017-12-26 NOTE — Progress Notes (Signed)
ANTICOAGULATION CONSULT NOTE - Initial Consult  Pharmacy Consult for Heparin  Indication: RLE DVT  Allergies  Allergen Reactions  . Hydrocodone Nausea And Vomiting  . Morphine Swelling    REACTION: swelling  . Pineapple Swelling    Throat swelling    Patient Measurements: Height: 5\' 3"  (160 cm) Weight: 220 lb (99.8 kg) IBW/kg (Calculated) : 52.4  Vital Signs: Temp: 98 F (36.7 C) (06/06 0030) Temp Source: Oral (06/06 0030) BP: 141/80 (06/06 0030) Pulse Rate: 64 (06/06 0030)  Labs: Recent Labs    12/24/17 Oct 27, 2133 12/25/17 0246 12/25/17 0854 12/25/17 1319 12/25/17 1407 12/26/17 0137  HGB 13.1 12.7  --   --   --  13.1  HCT 40.2 39.1  --   --   --  40.6  PLT 302 285  --   --   --  300  APTT  --   --   --  200* 172*  --   LABPROT  --   --   --  14.1 14.1  --   INR  --   --   --  1.10 1.09  --   HEPARINUNFRC  --   --   --   --   --  0.78*  CREATININE 0.67 0.64  --   --   --   --   TROPONINI <0.03 <0.03 <0.03 <0.03  --   --     Estimated Creatinine Clearance: 70.6 mL/min (by C-G formula based on SCr of 0.64 mg/dL).   Medical History: Past Medical History:  Diagnosis Date  . Arthritis   . Chronic back pain   . Conversion disorder    caused by stress of daughter's death in 10/27/09  . Depression   . GERD (gastroesophageal reflux disease)   . Hypercholesterolemia   . Hypertension   . Kidney stone   . Migraine   . Panic attacks    daily, worse the 15th of each month  . Thyroid disease     Assessment: 72 y/o F on heparin for new onset RLE DVT, may also get cath, heparin level is elevated this AM, no issues per RN.   Goal of Therapy:  Heparin level 0.3-0.7 units/ml Monitor platelets by anticoagulation protocol: Yes   Plan:  -Dec heparin to 950 units/hr -1100 HL  Sujata Maines 12/26/2017,2:47 AM

## 2017-12-26 NOTE — Progress Notes (Signed)
Pharmacist order to hold scheduled xarelto until TR band is removed.

## 2017-12-26 NOTE — Interval H&P Note (Signed)
Cath Lab Visit (complete for each Cath Lab visit)  Clinical Evaluation Leading to the Procedure:   ACS: Yes.    Non-ACS:    Anginal Classification: CCS III  Anti-ischemic medical therapy: No Therapy  Non-Invasive Test Results: No non-invasive testing performed  Prior CABG: No previous CABG      History and Physical Interval Note:  12/26/2017 10:09 AM  Kendra [name]  has presented today for surgery, with the diagnosis of unstable angina  The various methods of treatment have been discussed with the patient and family. After consideration of risks, benefits and other options for treatment, the patient has consented to  Procedure(s): LEFT HEART CATH AND CORONARY ANGIOGRAPHY (N/A) as a surgical intervention .  The patient's history has been reviewed, patient examined, no change in status, stable for surgery.  I have reviewed the patient's chart and labs.  Questions were answered to the patient's satisfaction.     Quay Burow

## 2017-12-26 NOTE — Progress Notes (Signed)
RN removed TR band and applied tegaderm dressing.

## 2017-12-26 NOTE — Progress Notes (Addendum)
ANTICOAGULATION CONSULT NOTE - Initial Consult  Pharmacy Consult for Xarelto Indication: DVT  Allergies  Allergen Reactions  . Hydrocodone Nausea And Vomiting  . Morphine Swelling    REACTION: swelling  . Pineapple Swelling    Throat swelling    Patient Measurements: Height: 5\' 3"  (160 cm) Weight: 223 lb 6.4 oz (101.3 kg)(scale a) IBW/kg (Calculated) : 52.4  Vital Signs: Temp: 97.6 F (36.4 C) (06/06 0818) Temp Source: Oral (06/06 0818) BP: 136/83 (06/06 1032) Pulse Rate: 78 (06/06 1032)  Labs: Recent Labs    12/24/17 10/28/2133 12/25/17 0246 12/25/17 0854 12/25/17 1319 12/25/17 1407 12/26/17 0137 12/26/17 0341  HGB 13.1 12.7  --   --   --  13.1 13.2  HCT 40.2 39.1  --   --   --  40.6 40.9  PLT 302 285  --   --   --  300 321  APTT  --   --   --  200* 172*  --   --   LABPROT  --   --   --  14.1 14.1  --   --   INR  --   --   --  1.10 1.09  --   --   HEPARINUNFRC  --   --   --   --   --  0.78*  --   CREATININE 0.67 0.64  --   --   --   --   --   TROPONINI <0.03 <0.03 <0.03 <0.03  --   --   --    Estimated Creatinine Clearance: 71.2 mL/min (by C-G formula based on SCr of 0.64 mg/dL).  Medical History: Past Medical History:  Diagnosis Date  . Arthritis   . Chronic back pain   . Conversion disorder    caused by stress of daughter's death in 10-28-2009  . Depression   . GERD (gastroesophageal reflux disease)   . Hypercholesterolemia   . Hypertension   . Kidney stone   . Migraine   . Panic attacks    daily, worse the 15th of each month  . Thyroid disease    Medications:  Medications Prior to Admission  Medication Sig Dispense Refill Last Dose  . amLODipine (NORVASC) 10 MG tablet Take 10 mg by mouth daily.   0 12/24/2017 at Unknown time  . atorvastatin (LIPITOR) 40 MG tablet Take 40 mg by mouth daily at 6 PM.   0 12/24/2017 at Unknown time  . fluorometholone (FML) 0.1 % ophthalmic suspension Place 1 drop into the left eye daily.  0 12/24/2017 at Unknown time  . gabapentin  (NEURONTIN) 300 MG capsule Take 300 mg by mouth daily.   0 12/24/2017 at Unknown time  . hydrochlorothiazide (HYDRODIURIL) 12.5 MG tablet Take 12.5 mg by mouth daily.   0 12/24/2017 at Unknown time  . lisinopril (PRINIVIL,ZESTRIL) 40 MG tablet Take 40 mg by mouth daily.   0 12/24/2017 at Unknown time  . OPTIMAL-D 31540 units capsule Take 50,000 Units by mouth once a week.   0 Past Week at Unknown time  . pantoprazole (PROTONIX) 20 MG tablet Take 20 mg by mouth daily.     12/24/2017 at Unknown time  . propranolol (INDERAL) 20 MG tablet Take 20 mg by mouth at bedtime.    12/24/2017 at Unknown time  . risperiDONE (RISPERDAL) 0.5 MG tablet Take 0.5 mg by mouth at bedtime.   2 12/24/2017 at Unknown time  . sertraline (ZOLOFT) 100 MG tablet Take 100 mg by  mouth at bedtime.   2 12/24/2017 at Unknown time  . VENTOLIN HFA 108 (90 Base) MCG/ACT inhaler Inhale 1-2 puffs into the lungs every 6 (six) hours as needed for wheezing or shortness of breath.   0 unknown  . VESICARE 5 MG tablet Take 5 mg by mouth daily.   0 12/24/2017 at Unknown time    Assessment: 72 y/o F s/p cardiac cath.  Patient previously on heparin infusion, now transitioning to oral Xarelto for DVT. CT was negative for PE. CBC stable WNL   Goal of Therapy:  Monitor platelets by anticoagulation protocol: Yes   Plan:  D/C heparin infusion Xarelto 15mg  BID x21 days, then 20mg  QD Monitor renal function and for s/sx of bleeding  Nicole Kindred L Henessy Rohrer 12/26/2017,11:22 AM

## 2017-12-26 NOTE — Progress Notes (Signed)
Progress Note  Patient Name: Kendra [name] Date of Encounter: 12/26/2017  Primary Cardiologist: No primary care provider on file.   Subjective   Seen after LHC, resting comfortably. Continues to have chest pain.   Inpatient Medications    Scheduled Meds: . amLODipine  10 mg Oral Daily  . aspirin  81 mg Oral Daily  . atorvastatin  40 mg Oral q1800  . darifenacin  7.5 mg Oral Daily  . fluorometholone  1 drop Left Eye Daily  . gabapentin  300 mg Oral Daily  . hydrochlorothiazide  12.5 mg Oral Daily  . lisinopril  40 mg Oral Daily  . pantoprazole  40 mg Oral Daily  . propranolol  20 mg Oral QHS  . risperiDONE  0.5 mg Oral QHS  . sertraline  100 mg Oral QHS   Continuous Infusions: . sodium chloride 10 mL/hr at 12/25/17 0411  . heparin 950 Units/hr (12/26/17 0718)   PRN Meds: acetaminophen, albuterol, nitroGLYCERIN, ondansetron **OR** ondansetron (ZOFRAN) IV, traMADol   Vital Signs    Vitals:   12/26/17 1022 12/26/17 1024 12/26/17 1027 12/26/17 1032  BP: (!) 151/88  132/77 136/83  Pulse: 75  83 78  Resp: 14  10 10   Temp:      TempSrc:      SpO2: 96% 99% 100% 98%  Weight:      Height:        Intake/Output Summary (Last 24 hours) at 12/26/2017 1046 Last data filed at 12/26/2017 0731 Gross per 24 hour  Intake 743.39 ml  Output 700 ml  Net 43.39 ml   Filed Weights   12/24/17 2103 12/24/17 2125 12/26/17 0611  Weight: 220 lb (99.8 kg) 220 lb (99.8 kg) 223 lb 6.4 oz (101.3 kg)    Telemetry    NSR - Personally Reviewed  ECG    NSR, no ischemic changes  - Personally Reviewed  Physical Exam   GEN: No acute distress.   Neck: No JVD Cardiac: RRR, no murmurs, rubs, or gallops.  Respiratory: Clear to auscultation bilaterally. GI: Soft, nontender, non-distended  MS: No edema; No deformity. Neuro:  Nonfocal  Psych: Normal affect   Labs    Chemistry Recent Labs  Lab 12/24/17 2135 12/25/17 0246  NA 140 141  K 3.7 4.0  CL 101 102  CO2 28 32  GLUCOSE  105* 128*  BUN 17 15  CREATININE 0.67 0.64  CALCIUM 9.4 9.2  PROT 7.0 6.5  ALBUMIN 3.9 3.6  AST 22 21  ALT 19 18  ALKPHOS 114 107  BILITOT 0.5 0.7  GFRNONAA >60 >60  GFRAA >60 >60  ANIONGAP 11 7     Hematology Recent Labs  Lab 12/25/17 0246 12/26/17 0137 12/26/17 0341  WBC 10.8* 8.6 8.3  RBC 4.06 4.19 4.25  HGB 12.7 13.1 13.2  HCT 39.1 40.6 40.9  MCV 96.3 96.9 96.2  MCH 31.3 31.3 31.1  MCHC 32.5 32.3 32.3  RDW 13.9 13.9 13.7  PLT 285 300 321    Cardiac Enzymes Recent Labs  Lab 12/24/17 2135 12/25/17 0246 12/25/17 0854 12/25/17 1319  TROPONINI <0.03 <0.03 <0.03 <0.03   No results for input(s): TROPIPOC in the last 168 hours.   BNPNo results for input(s): BNP, PROBNP in the last 168 hours.   DDimer  Recent Labs  Lab 12/24/17 2135  DDIMER 0.98*     Radiology    Dg Chest 2 View  Result Date: 12/24/2017 CLINICAL DATA:  Left sided chest pain. EXAM: CHEST -  2 VIEW COMPARISON:  Radiographs 09/02/2014 FINDINGS: The cardiomediastinal contours are normal. Atherosclerosis of the aortic arch. Pulmonary vasculature is normal. No consolidation, pleural effusion, or pneumothorax. No acute osseous abnormalities are seen. IMPRESSION: 1.  No acute pulmonary process. 2.  Aortic Atherosclerosis (ICD10-I70.0). Electronically Signed   By: Jeb Levering M.D.   On: 12/24/2017 23:10   Ct Angio Chest Pe W Or Wo Contrast  Result Date: 12/25/2017 CLINICAL DATA:  Acute onset of shortness of breath and generalized chest pain, radiating down the left arm. EXAM: CT ANGIOGRAPHY CHEST WITH CONTRAST TECHNIQUE: Multidetector CT imaging of the chest was performed using the standard protocol during bolus administration of intravenous contrast. Multiplanar CT image reconstructions and MIPs were obtained to evaluate the vascular anatomy. CONTRAST:  140mL ISOVUE-370 IOPAMIDOL (ISOVUE-370) INJECTION 76% COMPARISON:  Chest radiograph performed 12/24/2017 FINDINGS: Cardiovascular:  There is no  evidence of pulmonary embolus. The heart is normal in size. Diffuse coronary artery calcifications are seen. Mild calcification is noted along the aortic arch. The great vessels are grossly unremarkable. Mediastinum/Nodes: The mediastinum is otherwise unremarkable. No mediastinal lymphadenopathy is seen. No pericardial effusion is identified. The visualized portions of the thyroid gland are unremarkable. No axillary lymphadenopathy is seen. Lungs/Pleura: The lungs are essentially clear bilaterally. No focal consolidation, pleural effusion or pneumothorax is seen. No masses are identified. Upper Abdomen: The visualized portions of the liver and spleen are unremarkable. The patient is status post cholecystectomy, with clips noted at the gallbladder fossa. The visualized portions of the pancreas and adrenal glands are within normal limits. Musculoskeletal: No acute osseous abnormalities are identified. Anterior bridging osteophytes are noted along the thoracic spine. The visualized musculature is unremarkable in appearance. Review of the MIP images confirms the above findings. IMPRESSION: 1. No evidence of pulmonary embolus. 2. Lungs clear bilaterally. 3. Diffuse coronary artery calcifications seen. Electronically Signed   By: Garald Balding M.D.   On: 12/25/2017 03:16   US Venous Img Lower Bilateral  Result Date: 12/25/2017 CLINICAL DATA:  BILATERAL LOWER EXTREMITY PAIN AND SHORTNESS OF BREATH EXAM: BILATERAL LOWER EXTREMITY VENOUS DOPPLER ULTRASOUND TECHNIQUE: Gray-scale sonography with graded compression, as well as color Doppler and duplex ultrasound were performed to evaluate the lower extremity deep venous systems from the level of the common femoral vein and including the common femoral, femoral, profunda femoral, popliteal and calf veins including the posterior tibial, peroneal and gastrocnemius veins when visible. The superficial great saphenous vein was also interrogated. Spectral Doppler was utilized to  evaluate flow at rest and with distal augmentation maneuvers in the common femoral, femoral and popliteal veins. COMPARISON:  None. FINDINGS: RIGHT LOWER EXTREMITY Common Femoral Vein: No evidence of thrombus. Normal compressibility, respiratory phasicity and response to augmentation. Saphenofemoral Junction: No evidence of thrombus. Normal compressibility and flow on color Doppler imaging. Profunda Femoral Vein: No evidence of thrombus. Normal compressibility and flow on color Doppler imaging. Femoral Vein: No evidence of thrombus. Normal compressibility, respiratory phasicity and response to augmentation. Popliteal Vein: No evidence of thrombus. Normal compressibility, respiratory phasicity and response to augmentation. Calf Veins: RIGHT PERONEAL CALF VEIN DEMONSTRATES HYPOECHOIC INTRALUMINAL THROMBUS AND APPEARS NONCOMPRESSIBLE COMPATIBLE WITH CALF REGION DVT. POSTERIOR TIBIAL VEIN APPEARS PATENT. Superficial Great Saphenous Vein: No evidence of thrombus. Normal compressibility. Venous Reflux:  None. Other Findings:  None. LEFT LOWER EXTREMITY Common Femoral Vein: No evidence of thrombus. Normal compressibility, respiratory phasicity and response to augmentation. Saphenofemoral Junction: No evidence of thrombus. Normal compressibility and flow on color Doppler imaging. Profunda Femoral Vein:  No evidence of thrombus. Normal compressibility and flow on color Doppler imaging. Femoral Vein: No evidence of thrombus. Normal compressibility, respiratory phasicity and response to augmentation. Popliteal Vein: No evidence of thrombus. Normal compressibility, respiratory phasicity and response to augmentation. Calf Veins: No evidence of thrombus. Normal compressibility and flow on color Doppler imaging. Superficial Great Saphenous Vein: No evidence of thrombus. Normal compressibility. Venous Reflux:  None. Other Findings:  None. IMPRESSION: Positive exam for right calf region peroneal DVT. No propagation into the popliteal  or femoral veins. Negative for left lower extremity DVT. Electronically Signed   By: Jerilynn Mages.  Shick M.D.   On: 12/25/2017 10:54    Cardiac Studies   6/6 LHC:   Prox LAD lesion is 30% stenosed.  Patient Profile     72 y.o. female with a pmhx of HTN and HLD who presented to the ED on 12/24/17 with chest pain. She was also complaining of leg pain. She was found to have a right calf peroneal DVT, CTA chest was negative for PE. CTA chest did demonstrated diffuse coronary calcifications and calcification along the aortic arch. Troponins remained < 0.03 x 4 and EKG without acute ischemic changes. She went for [facility] today 6/6 with normal coronaries and normal filling pressures.   Assessment & Plan    Chest Pain: ACS ruled out with serial troponins and EKGs. LHC today with normal coronaries and normal filling pressures. Question subsegmental PEs not captured by CTA chest? Will defer anticoagulation to primary. Cardiology will sign off.   DVT:  -- Ok to stop IV heparin, anticoagulation per primary   HTN: Well controlled  -- Continue home regimen   HLD: Lipid panel pending  -- Continue home lipitor 40  For questions or updates, please contact [facility] Please consult [email or web address] for contact info under Cardiology/STEMI.      Signed, Velna Ochs, MD  12/26/2017, 10:46 AM

## 2017-12-27 DIAGNOSIS — I82401 Acute embolism and thrombosis of unspecified deep veins of right lower extremity: Secondary | ICD-10-CM | POA: Diagnosis not present

## 2017-12-27 DIAGNOSIS — R079 Chest pain, unspecified: Secondary | ICD-10-CM | POA: Diagnosis not present

## 2017-12-27 DIAGNOSIS — E782 Mixed hyperlipidemia: Secondary | ICD-10-CM | POA: Diagnosis not present

## 2017-12-27 LAB — BASIC METABOLIC PANEL
Anion gap: 11 (ref 5–15)
BUN: 11 mg/dL (ref 6–20)
CALCIUM: 9.1 mg/dL (ref 8.9–10.3)
CO2: 29 mmol/L (ref 22–32)
Chloride: 101 mmol/L (ref 101–111)
Creatinine, Ser: 0.63 mg/dL (ref 0.44–1.00)
GFR calc non Af Amer: >60 mL/min (ref >60)
Glucose, Bld: 111 mg/dL — ABNORMAL HIGH (ref 65–99)
Potassium: 4.2 mmol/L (ref 3.5–5.1)
SODIUM: 141 mmol/L (ref 135–145)

## 2017-12-27 LAB — CBC
HCT: 42.7 % (ref 36.0–46.0)
Hemoglobin: 13.6 g/dL (ref 12.0–15.0)
MCH: 31.1 pg (ref 26.0–34.0)
MCHC: 31.9 g/dL (ref 30.0–36.0)
MCV: 97.5 fL (ref 78.0–100.0)
Platelets: 274 10*3/uL (ref 150–400)
RBC: 4.38 MIL/uL (ref 3.87–5.11)
RDW: 13.6 % (ref 11.5–15.5)
WBC: 7.6 10*3/uL (ref 4.0–10.5)

## 2017-12-27 LAB — LIPID PANEL
CHOLESTEROL: 191 mg/dL (ref 0–200)
HDL: 51 mg/dL (ref >40)
LDL Cholesterol: 118 mg/dL — ABNORMAL HIGH (ref 0–99)
TRIGLYCERIDES: 108 mg/dL (ref <150)
Total CHOL/HDL Ratio: 3.7 RATIO
VLDL: 22 mg/dL (ref 0–40)

## 2017-12-27 MED ORDER — RIVAROXABAN (XARELTO) VTE STARTER PACK (15 & 20 MG): ORAL_TABLET | ORAL | 0 refills | Status: DC

## 2017-12-27 MED ORDER — RIVAROXABAN 20 MG PO TABS: 20.0000 mg | ORAL_TABLET | Freq: Every day | ORAL | 3 refills | Status: DC

## 2017-12-27 MED ORDER — NITROGLYCERIN 0.4 MG SL SUBL: 0.4000 mg | SUBLINGUAL_TABLET | SUBLINGUAL | 12 refills | Status: DC | PRN

## 2017-12-27 NOTE — Progress Notes (Addendum)
Benefit check for Xarelto in progress; B Pennie Rushing [phone] Xarelto coupon card given to the patient with explanation of usage.    RE: Benefit check  Received: Today  Message Contents  Memory Argue CMA        # 1. S/W JILLIANIS @ [facility] # [phone]    1. XARELTO  15 MG BID  COVER- YES  CO-PAY- $ 8.00  TIER- 3 DRUG  PRIOR APPROVAL- NO   PREFERRED PHARMACY : YES- [facility]

## 2017-12-27 NOTE — Discharge Instructions (Signed)
Information on my medicine - XARELTO (rivaroxaban)  This medication education was reviewed with me or my healthcare representative as part of my discharge preparation.  The pharmacist that spoke with me during my hospital stay was:  Saundra Shelling, [place]? Xarelto was prescribed to treat blood clots that may have been found in the veins of your legs (deep vein thrombosis) or in your lungs (pulmonary embolism) and to reduce the risk of them occurring again.  What do you need to know about Xarelto? The starting dose is one 15 mg tablet taken TWICE daily with food for the FIRST 21 DAYS then on (enter date)  01/16/18  the dose is changed to one 20 mg tablet taken ONCE A DAY with your evening meal.  DO NOT stop taking Xarelto without talking to the health care provider who prescribed the medication.  Refill your prescription for 20 mg tablets before you run out.  After discharge, you should have regular check-up appointments with your healthcare provider that is prescribing your Xarelto.  In the future your dose may need to be changed if your kidney function changes by a significant amount.  What do you do if you miss a dose? If you are taking Xarelto TWICE DAILY and you miss a dose, take it as soon as you remember. You may take two 15 mg tablets (total 30 mg) at the same time then resume your regularly scheduled 15 mg twice daily the next day.  If you are taking Xarelto ONCE DAILY and you miss a dose, take it as soon as you remember on the same day then continue your regularly scheduled once daily regimen the next day. Do not take two doses of Xarelto at the same time.   Important Safety Information Xarelto is a blood thinner medicine that can cause bleeding. You should call your healthcare provider right away if you experience any of the following: ? Bleeding from an injury or your nose that does not stop. ? Unusual colored urine (red or dark brown) or  unusual colored stools (red or black). ? Unusual bruising for unknown reasons. ? A serious fall or if you hit your head (even if there is no bleeding).  Some medicines may interact with Xarelto and might increase your risk of bleeding while on Xarelto. To help avoid this, consult your healthcare provider or pharmacist prior to using any new prescription or non-prescription medications, including herbals, vitamins, non-steroidal anti-inflammatory drugs (NSAIDs) and supplements.  This website has more information on Xarelto: [email or web address]

## 2017-12-27 NOTE — Discharge Summary (Signed)
Physician Discharge Summary   Patient ID: Kendra [name] MRN: [id number] DOB/AGE: [birth year] 72 y.o.  Admit date: 12/24/2017 Discharge date: 12/27/2017  Primary Care Physician:  Rosita Fire, MD   Recommendations for Outpatient Follow-up:  1. Follow up with PCP in 1-2 weeks 2. Patient started on Xarelto for acute DVT for 3 months, recheck venous Doppler prior to stopping Xarelto 3. Once completed Xarelto for 3 months she should be on aspirin 81 mg daily.   Home Health: None Equipment/Devices:  none  Discharge Condition: stable CODE STATUS: FULL  Diet recommendation: Heart healthy   Discharge Diagnoses:    . Atypical chest pain . Acute RLE DVT (deep venous thrombosis) (HCC) Essential hypertension Hyperlipidemia   Consults: Cardiology   Allergies:   Allergies  Allergen Reactions  . Hydrocodone Nausea And Vomiting  . Morphine Swelling    REACTION: swelling  . Pineapple Swelling    Throat swelling     DISCHARGE MEDICATIONS: Allergies as of 12/27/2017      Reactions   Hydrocodone Nausea And Vomiting   Morphine Swelling   REACTION: swelling   Pineapple Swelling   Throat swelling      Medication List    TAKE these medications   amLODipine 10 MG tablet Commonly known as:  NORVASC Take 10 mg by mouth daily.   atorvastatin 40 MG tablet Commonly known as:  LIPITOR Take 40 mg by mouth daily at 6 PM.   fluorometholone 0.1 % ophthalmic suspension Commonly known as:  FML Place 1 drop into the left eye daily.   gabapentin 300 MG capsule Commonly known as:  NEURONTIN Take 300 mg by mouth daily.   hydrochlorothiazide 12.5 MG tablet Commonly known as:  HYDRODIURIL Take 12.5 mg by mouth daily.   lisinopril 40 MG tablet Commonly known as:  PRINIVIL,ZESTRIL Take 40 mg by mouth daily.   nitroGLYCERIN 0.4 MG SL tablet Commonly known as:  NITROSTAT Place 1 tablet (0.4 mg total) under the tongue every 5 (five) minutes x 3 doses as needed for chest pain.    OPTIMAL-D 32951 units capsule Generic drug:  Cholecalciferol Take 50,000 Units by mouth once a week.   pantoprazole 20 MG tablet Commonly known as:  PROTONIX Take 20 mg by mouth daily.   propranolol 20 MG tablet Commonly known as:  INDERAL Take 20 mg by mouth at bedtime.   risperiDONE 0.5 MG tablet Commonly known as:  RISPERDAL Take 0.5 mg by mouth at bedtime.   Rivaroxaban 15 & 20 MG Tbpk Take as directed on package: Start with one 15mg  tablet by mouth twice a day with food. On Day 22 (01/16/2018), switch to one 20mg  tablet once a day with food.   rivaroxaban 20 MG Tabs tablet Commonly known as:  XARELTO Take 1 tablet (20 mg total) by mouth daily with supper. Please fill this prescription after the first starter pack has been completed. Start taking on:  01/16/2018   sertraline 100 MG tablet Commonly known as:  ZOLOFT Take 100 mg by mouth at bedtime.   VENTOLIN HFA 108 (90 Base) MCG/ACT inhaler Generic drug:  albuterol Inhale 1-2 puffs into the lungs every 6 (six) hours as needed for wheezing or shortness of breath.   VESICARE 5 MG tablet Generic drug:  solifenacin Take 5 mg by mouth daily.        Brief H and P: For complete details please refer to admission H and P, but in brief 72 year old female with a history of depression, conversion disorder, hypertension,  hyperlipidemia presented with chest pain that began on the afternoon of 12/24/2017 while they were at church sitting in the pews.This was associated with some shortness of breath with radiation of her pain to her jaw and left arm. The patient recently returned from [place] where she was hospitalized for chest pain. Apparently, the patient had a nuclear medicine stress test at that time, but was unsure of the exact results of the test, she never had a heart catheterization.  Patient was admitted for further work-up, cardiology was consulted, recommended transferred to [facility] for cardiac cath.  Doppler  ultrasound of the lower extremity showed RLE DVT   Hospital Course:  Chest pain: Radiating to left jaw and arm, anginal equivalent -Troponins x4 neg -CT angiogram of the chest showed no evidence of PE, lungs clear bilaterally, diffuse coronary artery calcification -Continue statin, beta-blocker.  Patient was initially placed on aspirin and started on IV heparin. -2D echo showed EF of 60 to 65% with normal wall motion, grade 1 diastolic dysfunction - Cardiology was consulted at  [facility], seen by Dr. Bronson Ing, recommended coronary angiography on 6/6 and patient was transferred to [facility]. -Patient underwent cardiac cath which showed 30% LAD disease, no PCI needed. -Patient was placed on Xarelto for acute DVT.  Once completed Xarelto for 3 months she should be on aspirin 81 mg daily.     Right lower extremity DVT (deep venous thrombosis) ([place]) -Patient complained of left leg swelling and pain, Doppler ultrasound of the lower extremity however showed right leg DVT, d/w sonographer who confirmed with [facility], DVT was in RLE - CT angiogram of the chest showed no PE.  Regardless patient will be on anticoagulation -Discussed with the patient and husband pros and cons of NOAC's, risk factors of bleeding.  Placed on Xarelto for 3 months.  Once anticoagulation is completed, patient can start aspirin 81 mg daily    Essential hypertension -BP currently stable continue beta-blocker, amlodipine, lisinopril HCTZ    Mixed hyperlipidemia -Continue Lipitor, LDL 118     Day of Discharge S: Doing much better, no complaints, eager to go home  BP 133/74 (BP Location: Left Arm)   Pulse 70   Temp 98 F (36.7 C) (Oral)   Resp 20   Ht 5\' 3"  (1.6 m)   Wt 101.8 kg (224 lb 6.4 oz) Comment: scale a  SpO2 94%   BMI 39.75 kg/m   Physical Exam: General: Alert and awake oriented x3 not in any acute distress. HEENT: anicteric sclera, pupils reactive to light and accommodation CVS: S1-S2 clear no murmur rubs  or gallops Chest: clear to auscultation bilaterally, no wheezing rales or rhonchi Abdomen: soft nontender, nondistended, normal bowel sounds Extremities: no cyanosis, clubbing or edema noted bilaterally Neuro: Cranial nerves II-XII intact, no focal neurological deficits   The results of significant diagnostics from this hospitalization (including imaging, microbiology, ancillary and laboratory) are listed below for reference.      Procedures/Studies:  Procedures   LEFT HEART CATH AND CORONARY ANGIOGRAPHY  Conclusion     Prox LAD lesion is 30% stenosed.     Echo: Study Conclusions  - Left ventricle: The cavity size was normal. Wall thickness was   increased in a pattern of moderate LVH. Systolic function was   normal. The estimated ejection fraction was in the range of 60%   to 65%. Wall motion was normal; there were no regional wall   motion abnormalities. Doppler parameters are consistent with   abnormal left  ventricular relaxation (grade 1 diastolic   dysfunction). - Aortic valve: Sclerosis without stenosis.   Dg Chest 2 View  Result Date: 12/24/2017 CLINICAL DATA:  Left sided chest pain. EXAM: CHEST - 2 VIEW COMPARISON:  Radiographs 09/02/2014 FINDINGS: The cardiomediastinal contours are normal. Atherosclerosis of the aortic arch. Pulmonary vasculature is normal. No consolidation, pleural effusion, or pneumothorax. No acute osseous abnormalities are seen. IMPRESSION: 1.  No acute pulmonary process. 2.  Aortic Atherosclerosis (ICD10-I70.0). Electronically Signed   By: Jeb Levering M.D.   On: 12/24/2017 23:10   Ct Angio Chest Pe W Or Wo Contrast  Result Date: 12/25/2017 CLINICAL DATA:  Acute onset of shortness of breath and generalized chest pain, radiating down the left arm. EXAM: CT ANGIOGRAPHY CHEST WITH CONTRAST TECHNIQUE: Multidetector CT imaging of the chest was performed using the standard protocol during bolus administration of intravenous contrast. Multiplanar  CT image reconstructions and MIPs were obtained to evaluate the vascular anatomy. CONTRAST:  162mL ISOVUE-370 IOPAMIDOL (ISOVUE-370) INJECTION 76% COMPARISON:  Chest radiograph performed 12/24/2017 FINDINGS: Cardiovascular:  There is no evidence of pulmonary embolus. The heart is normal in size. Diffuse coronary artery calcifications are seen. Mild calcification is noted along the aortic arch. The great vessels are grossly unremarkable. Mediastinum/Nodes: The mediastinum is otherwise unremarkable. No mediastinal lymphadenopathy is seen. No pericardial effusion is identified. The visualized portions of the thyroid gland are unremarkable. No axillary lymphadenopathy is seen. Lungs/Pleura: The lungs are essentially clear bilaterally. No focal consolidation, pleural effusion or pneumothorax is seen. No masses are identified. Upper Abdomen: The visualized portions of the liver and spleen are unremarkable. The patient is status post cholecystectomy, with clips noted at the gallbladder fossa. The visualized portions of the pancreas and adrenal glands are within normal limits. Musculoskeletal: No acute osseous abnormalities are identified. Anterior bridging osteophytes are noted along the thoracic spine. The visualized musculature is unremarkable in appearance. Review of the MIP images confirms the above findings. IMPRESSION: 1. No evidence of pulmonary embolus. 2. Lungs clear bilaterally. 3. Diffuse coronary artery calcifications seen. Electronically Signed   By: Garald Balding M.D.   On: 12/25/2017 03:16   US Venous Img Lower Bilateral  Result Date: 12/25/2017 CLINICAL DATA:  BILATERAL LOWER EXTREMITY PAIN AND SHORTNESS OF BREATH EXAM: BILATERAL LOWER EXTREMITY VENOUS DOPPLER ULTRASOUND TECHNIQUE: Gray-scale sonography with graded compression, as well as color Doppler and duplex ultrasound were performed to evaluate the lower extremity deep venous systems from the level of the common femoral vein and including the  common femoral, femoral, profunda femoral, popliteal and calf veins including the posterior tibial, peroneal and gastrocnemius veins when visible. The superficial great saphenous vein was also interrogated. Spectral Doppler was utilized to evaluate flow at rest and with distal augmentation maneuvers in the common femoral, femoral and popliteal veins. COMPARISON:  None. FINDINGS: RIGHT LOWER EXTREMITY Common Femoral Vein: No evidence of thrombus. Normal compressibility, respiratory phasicity and response to augmentation. Saphenofemoral Junction: No evidence of thrombus. Normal compressibility and flow on color Doppler imaging. Profunda Femoral Vein: No evidence of thrombus. Normal compressibility and flow on color Doppler imaging. Femoral Vein: No evidence of thrombus. Normal compressibility, respiratory phasicity and response to augmentation. Popliteal Vein: No evidence of thrombus. Normal compressibility, respiratory phasicity and response to augmentation. Calf Veins: RIGHT PERONEAL CALF VEIN DEMONSTRATES HYPOECHOIC INTRALUMINAL THROMBUS AND APPEARS NONCOMPRESSIBLE COMPATIBLE WITH CALF REGION DVT. POSTERIOR TIBIAL VEIN APPEARS PATENT. Superficial Great Saphenous Vein: No evidence of thrombus. Normal compressibility. Venous Reflux:  None. Other Findings:  None. LEFT  LOWER EXTREMITY Common Femoral Vein: No evidence of thrombus. Normal compressibility, respiratory phasicity and response to augmentation. Saphenofemoral Junction: No evidence of thrombus. Normal compressibility and flow on color Doppler imaging. Profunda Femoral Vein: No evidence of thrombus. Normal compressibility and flow on color Doppler imaging. Femoral Vein: No evidence of thrombus. Normal compressibility, respiratory phasicity and response to augmentation. Popliteal Vein: No evidence of thrombus. Normal compressibility, respiratory phasicity and response to augmentation. Calf Veins: No evidence of thrombus. Normal compressibility and flow on color  Doppler imaging. Superficial Great Saphenous Vein: No evidence of thrombus. Normal compressibility. Venous Reflux:  None. Other Findings:  None. IMPRESSION: Positive exam for right calf region peroneal DVT. No propagation into the popliteal or femoral veins. Negative for left lower extremity DVT. Electronically Signed   By: Jerilynn Mages.  Shick M.D.   On: 12/25/2017 10:54       LAB RESULTS: Basic Metabolic Panel: Recent Labs  Lab 12/25/17 0246 12/27/17 0637  NA 141 141  K 4.0 4.2  CL 102 101  CO2 32 29  GLUCOSE 128* 111*  BUN 15 11  CREATININE 0.64 0.63  CALCIUM 9.2 9.1   Liver Function Tests: Recent Labs  Lab 12/24/17 2135 12/25/17 0246  AST 22 21  ALT 19 18  ALKPHOS 114 107  BILITOT 0.5 0.7  PROT 7.0 6.5  ALBUMIN 3.9 3.6   No results for input(s): LIPASE, AMYLASE in the last 168 hours. No results for input(s): AMMONIA in the last 168 hours. CBC: Recent Labs  Lab 12/26/17 0341 12/27/17 0637  WBC 8.3 7.6  HGB 13.2 13.6  HCT 40.9 42.7  MCV 96.2 97.5  PLT 321 274   Cardiac Enzymes: Recent Labs  Lab 12/25/17 0854 12/25/17 1319  TROPONINI <0.03 <0.03   BNP: Invalid input(s): POCBNP CBG: Recent Labs  Lab 12/24/17 2133  GLUCAP 109*      Disposition and Follow-up: Discharge Instructions    Diet - low sodium heart healthy   Complete by:  As directed    Discharge instructions   Complete by:  As directed    Please take xarelto 15mg  twice a day with food. On June 27th, start xarelto 20mg  once daily with supper.   Please don't take Aspirin, ibuprofen or motrin for any pain while you are on xarelto. Watch for any bleeding.   Increase activity slowly   Complete by:  As directed        DISPOSITION: Home   DISCHARGE FOLLOW-UP Follow-up Information    Rosita Fire, MD. Go on 01/03/2018.   Specialty:  Internal Medicine Why:  @10 :30am Contact information: [place] [id number] [phone]            Time coordinating  discharge:  40 minutes  Signed:   Estill Cotta M.D. [facility] 12/27/2017, 12:44 PM Pager: [phone]

## 2017-12-30 MED FILL — Heparin Sod (Porcine)-NaCl IV Soln 1000 Unit/500ML-0.9%: INTRAVENOUS | Qty: 1000 | Status: AC

## 2018-05-08 ENCOUNTER — Encounter: Payer: Self-pay | Admitting: Physician Assistant

## 2018-05-08 ENCOUNTER — Ambulatory Visit (INDEPENDENT_AMBULATORY_CARE_PROVIDER_SITE_OTHER): Payer: PRIVATE HEALTH INSURANCE | Admitting: Physician Assistant

## 2018-05-08 VITALS — BP 128/80 | HR 82 | Ht 63.0 in | Wt 231.0 lb

## 2018-05-08 DIAGNOSIS — M79605 Pain in left leg: Secondary | ICD-10-CM

## 2018-05-08 DIAGNOSIS — R5383 Other fatigue: Secondary | ICD-10-CM

## 2018-05-08 DIAGNOSIS — I10 Essential (primary) hypertension: Secondary | ICD-10-CM | POA: Diagnosis not present

## 2018-05-08 DIAGNOSIS — M79604 Pain in right leg: Secondary | ICD-10-CM

## 2018-05-08 DIAGNOSIS — R002 Palpitations: Secondary | ICD-10-CM

## 2018-05-08 DIAGNOSIS — I82401 Acute embolism and thrombosis of unspecified deep veins of right lower extremity: Secondary | ICD-10-CM | POA: Diagnosis not present

## 2018-05-08 DIAGNOSIS — E785 Hyperlipidemia, unspecified: Secondary | ICD-10-CM

## 2018-05-08 NOTE — Progress Notes (Signed)
Cardiology Office Note    Date:  05/10/2018   ID:  Kendra [name], DOB 28-Nov-1945, MRN [id number]  PCP:  Rosita Fire, MD  Cardiologist:  Dr. Bronson Ing   Chief Complaint  Patient presents with  . Follow-up    seen for Dr. Bronson Ing    History of Present Illness:  Kendra [name] is a 72 y.o. female with PMH of HTN, HLD, and GERD who was recently evaluated in June for chest pain.  D-dimer was elevated at this time.  CT angiogram of the chest showed no PE.  Lower extremity venous Doppler demonstrated right calf peroneal DVT.  Prior to the hospital admission, patient was seen in [place] for chest pain as well.  EKG demonstrated no acute ST-T wave abnormality.  Her symptom was concerning for ischemic etiology.  Patient was started on IV heparin.  Echocardiogram obtained on 12/25/2017 showed EF 60 to 65%, grade 1 DD.  She ultimately underwent cardiac catheterization on 12/26/2017 which demonstrated essentially normal coronaries with only 30% LAD disease, normal filling pressure.  Postprocedure, patient was placed on Xarelto for acute DVT for 31-month.  She was supposed to have a venous Doppler prior to stopping the Xarelto.  Patient presents today for a very delayed cardiology follow-up.  She says she missed at the previous follow-up because they had a emergency and I have to fly to [place].  She took her Xarelto all the way up to 3 weeks ago, she has not taken any Xarelto for the past 3 weeks.  Since she completed 3 months of Xarelto therapy.  I plan to obtain a lower extremity Doppler, if there is no further blood clot noticeable in the lower extremity, I plan to switch to Xarelto to aspirin only.  In the past few months, she also complained of occasional chest discomfort accompanied by strong tachycardia palpitation.  This occurs about once every 2 weeks.  She feels like her heart is racing up to 5 minutes each time.  I recommended a 30-day event monitor.  Otherwise she denies any significant  shortness of breath.  My suspicion for PE is very low at this time.  As far as her lower extremity pain, the pain is worse on the right side.  She says her lower extremity discomfort is more noticeable during exertion.  However on physical exam, she has bounding lower extremity pulse, I would not recommend any arterial Doppler at this time.  If her symptoms persist on follow-up, may consider lower extremity ABI, however suspicion for significant PAD is also very low at this time.  I will obtain TSH to rule out other potential factors for palpitation and CBC to make sure there is no anemia.  Note, today's interview was conducted with the help of Spanish translator Mrs. Lyla Son   Past Medical History:  Diagnosis Date  . Arthritis   . Chronic back pain   . Conversion disorder    caused by stress of daughter's death in Nov 15, 2009  . Depression   . GERD (gastroesophageal reflux disease)   . Hypercholesterolemia   . Hypertension   . Kidney stone   . Migraine   . Panic attacks    daily, worse the 15th of each month  . Thyroid disease     Past Surgical History:  Procedure Laterality Date  . ABDOMINAL HYSTERECTOMY    . APPENDECTOMY    . CHOLECYSTECTOMY    . CYSTOSCOPY    . LEFT HEART CATH AND CORONARY ANGIOGRAPHY N/A  12/26/2017   Procedure: LEFT HEART CATH AND CORONARY ANGIOGRAPHY;  Surgeon: Lorretta Harp, MD;  Location: [facility];  Service: Cardiovascular;  Laterality: N/A;  . THYROID SURGERY      Current Medications: Outpatient Medications Prior to Visit  Medication Sig Dispense Refill  . amLODipine (NORVASC) 10 MG tablet Take 10 mg by mouth daily.   0  . atorvastatin (LIPITOR) 40 MG tablet Take 40 mg by mouth daily at 6 PM.   0  . fluorometholone (FML) 0.1 % ophthalmic suspension Place 1 drop into the left eye daily.  0  . gabapentin (NEURONTIN) 300 MG capsule Take 300 mg by mouth daily.   0  . hydrochlorothiazide (HYDRODIURIL) 12.5 MG tablet Take 12.5 mg by mouth  daily.   0  . lisinopril (PRINIVIL,ZESTRIL) 40 MG tablet Take 40 mg by mouth daily.   0  . nitroGLYCERIN (NITROSTAT) 0.4 MG SL tablet Place 1 tablet (0.4 mg total) under the tongue every 5 (five) minutes x 3 doses as needed for chest pain. 30 tablet 12  . OPTIMAL-D 41740 units capsule Take 50,000 Units by mouth once a week.   0  . pantoprazole (PROTONIX) 20 MG tablet Take 20 mg by mouth daily.      . propranolol (INDERAL) 20 MG tablet Take 20 mg by mouth at bedtime.     . risperiDONE (RISPERDAL) 0.5 MG tablet Take 0.5 mg by mouth at bedtime.   2  . rivaroxaban (XARELTO) 20 MG TABS tablet Take 1 tablet (20 mg total) by mouth daily with supper. Please fill this prescription after the first starter pack has been completed. 30 tablet 3  . Rivaroxaban 15 & 20 MG TBPK Take as directed on package: Start with one 15mg  tablet by mouth twice a day with food. On Day 22 (01/16/2018), switch to one 20mg  tablet once a day with food. 51 each 0  . sertraline (ZOLOFT) 100 MG tablet Take 100 mg by mouth at bedtime.   2  . VENTOLIN HFA 108 (90 Base) MCG/ACT inhaler Inhale 1-2 puffs into the lungs every 6 (six) hours as needed for wheezing or shortness of breath.   0  . VESICARE 5 MG tablet Take 5 mg by mouth daily.   0   No facility-administered medications prior to visit.      Allergies:   Hydrocodone; Morphine; and Pineapple   Social History   Socioeconomic History  . Marital status: Married    Spouse name: Not on file  . Number of children: Not on file  . Years of education: Not on file  . Highest education level: Not on file  Occupational History  . Not on file  Social Needs  . Financial resource strain: Not on file  . Food insecurity:    Worry: Not on file    Inability: Not on file  . Transportation needs:    Medical: Not on file    Non-medical: Not on file  Tobacco Use  . Smoking status: Never Smoker  . Smokeless tobacco: Never Used  Substance and Sexual Activity  . Alcohol use: No  . Drug  use: No  . Sexual activity: Never    Birth control/protection: None  Lifestyle  . Physical activity:    Days per week: Not on file    Minutes per session: Not on file  . Stress: Not on file  Relationships  . Social connections:    Talks on phone: Not on file    Gets  together: Not on file    Attends religious service: Not on file    Active member of club or organization: Not on file    Attends meetings of clubs or organizations: Not on file    Relationship status: Not on file  Other Topics Concern  . Not on file  Social History Narrative  . Not on file     Family History:  The patient's family history is not on file.   ROS:   Please see the history of present illness.    ROS All other systems reviewed and are negative.   PHYSICAL EXAM:   VS:  BP 128/80 (BP Location: Left Arm, Patient Position: Sitting, Cuff Size: Large)   Pulse 82   Ht 5\' 3"  (1.6 m)   Wt 231 lb (104.8 kg)   BMI 40.92 kg/m    GEN: Well nourished, well developed, in no acute distress  HEENT: normal  Neck: no JVD, carotid bruits, or masses Cardiac: RRR; no murmurs, rubs, or gallops,no edema  Respiratory:  clear to auscultation bilaterally, normal work of breathing GI: soft, nontender, nondistended, + BS MS: no deformity or atrophy  Skin: warm and dry, no rash Neuro:  Alert and Oriented x 3, Strength and sensation are intact Psych: euthymic mood, full affect  Wt Readings from Last 3 Encounters:  05/08/18 231 lb (104.8 kg)  12/27/17 224 lb 6.4 oz (101.8 kg)  03/15/15 217 lb (98.4 kg)      Studies/Labs Reviewed:   EKG:  EKG is not ordered today.   Recent Labs: 12/25/2017: ALT 18 12/27/2017: BUN 11; Creatinine, Ser 0.63; Potassium 4.2; Sodium 141 05/08/2018: Hemoglobin 14.1; Platelets 318; TSH 0.790   Lipid Panel    Component Value Date/Time   CHOL 191 12/27/2017 0637   TRIG 108 12/27/2017 0637   HDL 51 12/27/2017 0637   CHOLHDL 3.7 12/27/2017 0637   VLDL 22 12/27/2017 0637   LDLCALC 118 (H)  12/27/2017 0637    Additional studies/ records that were reviewed today include:   Echo 12/25/2017 LV EF: 60% -   65%  Study Conclusions  - Left ventricle: The cavity size was normal. Wall thickness was   increased in a pattern of moderate LVH. Systolic function was   normal. The estimated ejection fraction was in the range of 60%   to 65%. Wall motion was normal; there were no regional wall   motion abnormalities. Doppler parameters are consistent with   abnormal left ventricular relaxation (grade 1 diastolic   dysfunction). - Aortic valve: Sclerosis without stenosis.   Cath 12/26/2017 IMPRESSION: Ms. Teater has essentially normal coronary arteries and normal filling pressures.  I believe her chest pain is noncardiac.  The sheath was removed and a TR band was placed on the right wrist to achieve patent hemostasis.  She stable to undergo systemic any coagulation with a novel oral anticoagulant given her DVT and can be discharged home either today or tomorrow by the tried hospitalist service.  ASSESSMENT:    1. Palpitations   2. Acute deep vein thrombosis (DVT) of right lower extremity, unspecified vein (HCC)   3. Other fatigue   4. Essential hypertension   5. Hyperlipidemia, unspecified hyperlipidemia type   6. Pain in both lower extremities      PLAN:  In order of problems listed above:  1. Palpitation: Obtain 30-day event monitor.  Patient had symptomatic palpitations with occasional dizziness as well.  This occurs about once every 2 weeks.  The palpitation is  accompanied by chest pain.  From the cardiac catheterization during the last admission, patient has normal coronaries  2. History of DVT: Diagnosed with right lower extremity DVT in June.  Finished 3 months of Xarelto, has not taken Xarelto for the past week.  Given leg pain, I will obtain bilateral lower extremity Doppler to confirm presence of DVT.  If no DVT is present, patient can stop Xarelto  3. Hypertension:  Blood pressure stable  4. Hyperlipidemia: On Lipitor 40 mg daily  5. Leg pain: Patient has bounding pulses in bilateral lower extremity.  Suspicion for significant arterial disease very low.  However if symptoms persist and venous Doppler is negative, may consider ABI.    Medication Adjustments/Labs and Tests Ordered: Current medicines are reviewed at length with the patient today.  Concerns regarding medicines are outlined above.  Medication changes, Labs and Tests ordered today are listed in the Patient Instructions below. Patient Instructions  Medication Instructions:  Your physician recommends that you continue on your current medications as directed. Please refer to the Current Medication list given to you today. If you need a refill on your cardiac medications before your next appointment, please call your pharmacy.   Lab work: Your physician recommends that you return for lab work in: [facility], TSH If you have labs (blood work) drawn today and your tests are completely normal, you will receive your results only by: Marland Kitchen MyChart Message (if you have MyChart) OR . A paper copy in the mail If you have any lab test that is abnormal or we need to change your treatment, we will call you to review the results.  Testing/Procedures: Your physician has requested that you have a lower extremity venous duplex. This test is an ultrasound of the veins in the legs. It looks at venous blood flow that carries blood from the heart to the legs. Allow one hour for a Lower Venous exam. There are no restrictions or special instructions. [place] physician has recommended that you wear an 30DAY event monitor. Event monitors are medical devices that record the heart's electrical activity. Doctors most often [place] these monitors to diagnose arrhythmias. Arrhythmias are problems with the speed or rhythm of the heartbeat. The monitor is a small, portable device. You can wear one while you do your  normal daily activities. This is usually used to diagnose what is causing palpitations/syncope (passing out). [place]  Follow-Up: At [facility], you and your health needs are our priority.  As part of our continuing mission to provide you with exceptional heart care, we have created designated Provider Care Teams.  These Care Teams include your primary Cardiologist (physician) and Advanced Practice Providers (APPs -  Physician Assistants and Nurse Practitioners) who all work together to provide you with the care you need, when you need it. You will need a follow up appointment in 2 months. You may see Dr Bronson Ing or one of the following Advanced Practice Providers on your designated Care Team:   Bernerd Pho, PA-C [facility]) . Ermalinda Barrios, PA-C ([place])  Any Other Special Instructions Will Be Listed Below (If Applicable). NEEDS SPANISH INTERPRETER     Signed, Almyra Deforest, [place]  05/10/2018 11:44 PM    [facility] [address], [place], [place]  [address] Phone: [phone]; Fax: [phone]

## 2018-05-08 NOTE — Patient Instructions (Signed)
Medication Instructions:  Your physician recommends that you continue on your current medications as directed. Please refer to the Current Medication list given to you today. If you need a refill on your cardiac medications before your next appointment, please call your pharmacy.   Lab work: Your physician recommends that you return for lab work in: [facility], TSH If you have labs (blood work) drawn today and your tests are completely normal, you will receive your results only by: Marland Kitchen MyChart Message (if you have MyChart) OR . A paper copy in the mail If you have any lab test that is abnormal or we need to change your treatment, we will call you to review the results.  Testing/Procedures: Your physician has requested that you have a lower extremity venous duplex. This test is an ultrasound of the veins in the legs. It looks at venous blood flow that carries blood from the heart to the legs. Allow one hour for a Lower Venous exam. There are no restrictions or special instructions. [place] physician has recommended that you wear an 30DAY event monitor. Event monitors are medical devices that record the heart's electrical activity. Doctors most often [place] these monitors to diagnose arrhythmias. Arrhythmias are problems with the speed or rhythm of the heartbeat. The monitor is a small, portable device. You can wear one while you do your normal daily activities. This is usually used to diagnose what is causing palpitations/syncope (passing out). [place]  Follow-Up: At [facility], you and your health needs are our priority.  As part of our continuing mission to provide you with exceptional heart care, we have created designated Provider Care Teams.  These Care Teams include your primary Cardiologist (physician) and Advanced Practice Providers (APPs -  Physician Assistants and Nurse Practitioners) who all work together to provide you with the care you need, when you  need it. You will need a follow up appointment in 2 months. You may see Dr Bronson Ing or one of the following Advanced Practice Providers on your designated Care Team:   Bernerd Pho, PA-C [facility]) . Ermalinda Barrios, PA-C ([place])  Any Other Special Instructions Will Be Listed Below (If Applicable). NEEDS SPANISH INTERPRETER

## 2018-05-09 LAB — CBC
HEMATOCRIT: 41.4 % (ref 34.0–46.6)
Hemoglobin: 14.1 g/dL (ref 11.1–15.9)
MCH: 31.1 pg (ref 26.6–33.0)
MCHC: 34.1 g/dL (ref 31.5–35.7)
MCV: 91 fL (ref 79–97)
Platelets: 318 10*3/uL (ref 150–450)
RBC: 4.53 x10E6/uL (ref 3.77–5.28)
RDW: 13.3 % (ref 12.3–15.4)
WBC: 7.5 10*3/uL (ref 3.4–10.8)

## 2018-05-09 LAB — TSH: TSH: 0.79 u[IU]/mL (ref 0.450–4.500)

## 2018-05-10 ENCOUNTER — Encounter: Payer: Self-pay | Admitting: Physician Assistant

## 2018-05-12 ENCOUNTER — Ambulatory Visit (HOSPITAL_COMMUNITY)
Admission: RE | Admit: 2018-05-12 | Discharge: 2018-05-12 | Disposition: A | Discharge status: Home / Self Care | Payer: PRIVATE HEALTH INSURANCE | Source: Ambulatory Visit | Attending: Physician Assistant | Admitting: Physician Assistant

## 2018-05-12 DIAGNOSIS — R5383 Other fatigue: Secondary | ICD-10-CM | POA: Diagnosis not present

## 2018-05-12 DIAGNOSIS — I82401 Acute embolism and thrombosis of unspecified deep veins of right lower extremity: Secondary | ICD-10-CM | POA: Insufficient documentation

## 2018-05-12 DIAGNOSIS — R002 Palpitations: Secondary | ICD-10-CM | POA: Diagnosis present

## 2018-05-14 ENCOUNTER — Ambulatory Visit (INDEPENDENT_AMBULATORY_CARE_PROVIDER_SITE_OTHER): Payer: PRIVATE HEALTH INSURANCE

## 2018-05-14 DIAGNOSIS — R002 Palpitations: Secondary | ICD-10-CM | POA: Diagnosis not present

## 2018-06-26 ENCOUNTER — Encounter: Payer: Self-pay | Admitting: *Deleted

## 2019-01-02 ENCOUNTER — Emergency Department (HOSPITAL_COMMUNITY)
Admission: EM | Admit: 2019-01-02 | Discharge: 2019-01-03 | Disposition: A | Discharge status: Home / Self Care | Payer: Medicare HMO | Attending: Emergency Medicine | Admitting: Emergency Medicine

## 2019-01-02 ENCOUNTER — Encounter (HOSPITAL_COMMUNITY): Payer: Self-pay

## 2019-01-02 ENCOUNTER — Other Ambulatory Visit: Payer: Self-pay

## 2019-01-02 DIAGNOSIS — H81391 Other peripheral vertigo, right ear: Secondary | ICD-10-CM | POA: Diagnosis not present

## 2019-01-02 DIAGNOSIS — R079 Chest pain, unspecified: Secondary | ICD-10-CM | POA: Diagnosis not present

## 2019-01-02 DIAGNOSIS — H81399 Other peripheral vertigo, unspecified ear: Secondary | ICD-10-CM | POA: Diagnosis not present

## 2019-01-02 DIAGNOSIS — M542 Cervicalgia: Secondary | ICD-10-CM | POA: Diagnosis not present

## 2019-01-02 DIAGNOSIS — H9201 Otalgia, right ear: Secondary | ICD-10-CM | POA: Diagnosis not present

## 2019-01-02 DIAGNOSIS — Z79899 Other long term (current) drug therapy: Secondary | ICD-10-CM | POA: Insufficient documentation

## 2019-01-02 DIAGNOSIS — I10 Essential (primary) hypertension: Secondary | ICD-10-CM | POA: Insufficient documentation

## 2019-01-02 DIAGNOSIS — R42 Dizziness and giddiness: Secondary | ICD-10-CM | POA: Diagnosis present

## 2019-01-02 NOTE — ED Triage Notes (Signed)
Blood pressures up and down and dizziness x 1 week.  Pain in left arm and back

## 2019-01-03 ENCOUNTER — Emergency Department (HOSPITAL_COMMUNITY): Payer: Medicare HMO

## 2019-01-03 DIAGNOSIS — R42 Dizziness and giddiness: Secondary | ICD-10-CM | POA: Diagnosis not present

## 2019-01-03 DIAGNOSIS — R079 Chest pain, unspecified: Secondary | ICD-10-CM | POA: Diagnosis not present

## 2019-01-03 DIAGNOSIS — H81399 Other peripheral vertigo, unspecified ear: Secondary | ICD-10-CM | POA: Diagnosis not present

## 2019-01-03 DIAGNOSIS — I10 Essential (primary) hypertension: Secondary | ICD-10-CM | POA: Diagnosis not present

## 2019-01-03 LAB — COMPREHENSIVE METABOLIC PANEL
ALT: 18 U/L (ref 0–44)
AST: 21 U/L (ref 15–41)
Albumin: 3.9 g/dL (ref 3.5–5.0)
Alkaline Phosphatase: 125 U/L (ref 38–126)
Anion gap: 11 (ref 5–15)
BUN: 17 mg/dL (ref 8–23)
CO2: 29 mmol/L (ref 22–32)
Calcium: 9.4 mg/dL (ref 8.9–10.3)
Chloride: 100 mmol/L (ref 98–111)
Creatinine, Ser: 0.71 mg/dL (ref 0.44–1.00)
GFR calc Af Amer: >60 mL/min (ref >60)
GFR calc non Af Amer: >60 mL/min (ref >60)
Glucose, Bld: 133 mg/dL — ABNORMAL HIGH (ref 70–99)
Potassium: 3.4 mmol/L — ABNORMAL LOW (ref 3.5–5.1)
Sodium: 140 mmol/L (ref 135–145)
Total Bilirubin: 0.5 mg/dL (ref 0.3–1.2)
Total Protein: 7.1 g/dL (ref 6.5–8.1)

## 2019-01-03 LAB — CBC WITH DIFFERENTIAL/PLATELET
Abs Immature Granulocytes: 0.03 10*3/uL (ref 0.00–0.07)
Basophils Absolute: 0.1 10*3/uL (ref 0.0–0.1)
Basophils Relative: 1 %
Eosinophils Absolute: 0.5 10*3/uL (ref 0.0–0.5)
Eosinophils Relative: 6 %
HCT: 40.7 % (ref 36.0–46.0)
Hemoglobin: 13.1 g/dL (ref 12.0–15.0)
Immature Granulocytes: 0 %
Lymphocytes Relative: 16 %
Lymphs Abs: 1.5 10*3/uL (ref 0.7–4.0)
MCH: 30.8 pg (ref 26.0–34.0)
MCHC: 32.2 g/dL (ref 30.0–36.0)
MCV: 95.5 fL (ref 80.0–100.0)
Monocytes Absolute: 0.6 10*3/uL (ref 0.1–1.0)
Monocytes Relative: 7 %
Neutro Abs: 6.5 10*3/uL (ref 1.7–7.7)
Neutrophils Relative %: 70 %
Platelets: 290 10*3/uL (ref 150–400)
RBC: 4.26 MIL/uL (ref 3.87–5.11)
RDW: 14.7 % (ref 11.5–15.5)
WBC: 9.2 10*3/uL (ref 4.0–10.5)
nRBC: 0 % (ref 0.0–0.2)

## 2019-01-03 LAB — TROPONIN I: Troponin I: <0.03 ng/mL (ref <0.03)

## 2019-01-03 MED ORDER — POTASSIUM CHLORIDE CRYS ER 20 MEQ PO TBCR
40.0000 meq | EXTENDED_RELEASE_TABLET | Freq: Once | ORAL | Status: AC
  Administered 2019-01-03: 40 meq via ORAL
  Filled 2019-01-03: qty 2

## 2019-01-03 MED ORDER — CYCLOBENZAPRINE HCL 10 MG PO TABS: 10.0000 mg | ORAL_TABLET | Freq: Three times a day (TID) | ORAL | 0 refills | Status: DC | PRN

## 2019-01-03 MED ORDER — KETOROLAC TROMETHAMINE 30 MG/ML IJ SOLN: 30.0000 mg | Freq: Once | INTRAMUSCULAR | Status: DC

## 2019-01-03 MED ORDER — ASPIRIN 81 MG PO CHEW
324.0000 mg | CHEWABLE_TABLET | Freq: Once | ORAL | Status: AC
  Administered 2019-01-03: 324 mg via ORAL
  Filled 2019-01-03: qty 4

## 2019-01-03 MED ORDER — MECLIZINE HCL 12.5 MG PO TABS
25.0000 mg | ORAL_TABLET | Freq: Once | ORAL | Status: AC
  Administered 2019-01-03: 25 mg via ORAL
  Filled 2019-01-03: qty 2

## 2019-01-03 MED ORDER — MECLIZINE HCL 25 MG PO TABS: 25.0000 mg | ORAL_TABLET | Freq: Three times a day (TID) | ORAL | 0 refills | Status: DC | PRN

## 2019-01-03 MED ORDER — CYCLOBENZAPRINE HCL 10 MG PO TABS
10.0000 mg | ORAL_TABLET | Freq: Once | ORAL | Status: AC
  Administered 2019-01-03: 10 mg via ORAL
  Filled 2019-01-03: qty 1

## 2019-01-03 MED ORDER — ACETAMINOPHEN 325 MG PO TABS
650.0000 mg | ORAL_TABLET | Freq: Once | ORAL | Status: AC
  Administered 2019-01-03: 650 mg via ORAL
  Filled 2019-01-03: qty 2

## 2019-01-03 MED ORDER — KETOROLAC TROMETHAMINE 30 MG/ML IJ SOLN
30.0000 mg | Freq: Once | INTRAMUSCULAR | Status: AC
  Administered 2019-01-03: 30 mg via INTRAVENOUS
  Filled 2019-01-03: qty 1

## 2019-01-03 NOTE — ED Provider Notes (Signed)
[facility] EMERGENCY DEPARTMENT Provider Note   CSN: [id number] Arrival date & time: 01/02/19  10/21/2341    History   Chief Complaint Chief Complaint  Patient presents with  . Hypertension  . Dizziness    HPI Kendra [name] is a 73 y.o. female.   The history is provided by the patient. A language interpreter was used.  She has history of hypertension, hyperlipidemia, pulmonary embolism anticoagulated on rivaroxaban, labyrinthitis and comes in with multiple complaints over the last week.  During that time, she has been monitoring her blood pressure and it will go as high as 189/100, and then coming back down.  She is complaining of pain in her right ear which goes into her neck and she has had some vertigo with associated nausea but no vomiting.  She has had some tightness in her chest.  She denies dyspnea.  She has been taking her routine antihypertensive medications but has not tried anything else.  Symptoms are moderately severe.  Past Medical History:  Diagnosis Date  . Arthritis   . Chronic back pain   . 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 Depression   . GERD (gastroesophageal reflux disease)   . Hypercholesterolemia   . Hypertension   . Kidney stone   . Migraine   . Panic attacks    daily, worse the 15th of each month  . Thyroid disease     Patient Active Problem List   Diagnosis Date Noted  . DVT (deep venous thrombosis) ([place]) 12/26/2017  . Acute pulmonary embolism without acute cor pulmonale ([facility])   . Chest pain 12/25/2017  . Essential hypertension 12/25/2017  . Mixed hyperlipidemia 12/25/2017  . Nonspecific chest pain   . ABSCESS, TOOTH 09/06/2010  . OBESITY 07/12/2010  . CHOLECYSTECTOMY, HX OF 04/25/2010  . DEPRESSION 03/17/2010  . HYPERTENSION, BENIGN ESSENTIAL 10/28/2009  . ALLERGIC RHINITIS 10/12/2009  . SLEEP APNEA 10/12/2009  . HYPERPARATHYROIDISM UNSPECIFIED 07/19/2009  . KNEE PAIN, BILATERAL 01/11/2009  .  LABYRINTHITIS, ACUTE 09/29/2008  . BACK PAIN, LUMBAR 08/03/2008  . CALCANEAL SPUR, RIGHT 07/14/2008  . OSTEOPENIA 07/14/2008  . 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 GASTROENTERITIS, ACUTE 01/08/2008  . HEMATURIA UNSPECIFIED 01/08/2008  . TEMPOROMANDIBULAR JOINT PAIN 07/09/2007  . DYSLIPIDEMIA 03/07/2007  . GERD 03/07/2007  . OSTEOARTHROSIS, LOCAL, PRIMARY, LOWER LEG 03/07/2007  . MIGRAINE HEADACHE 03/03/2007  . CERVICAL MUSCLE STRAIN 03/03/2007  . HELICOBACTER PYLORI INFECTION, HX OF 12/21/2005    Past Surgical History:  Procedure Laterality Date  . ABDOMINAL HYSTERECTOMY    . APPENDECTOMY    . CHOLECYSTECTOMY    . CYSTOSCOPY    . LEFT HEART CATH AND CORONARY ANGIOGRAPHY N/A 12/26/2017   Procedure: LEFT HEART CATH AND CORONARY ANGIOGRAPHY;  Surgeon: Lorretta Harp, MD;  Location: [facility];  Service: Cardiovascular;  Laterality: N/A;  . THYROID SURGERY       OB History   No obstetric history on file.      Home Medications    Prior to Admission medications   Medication Sig Start Date End Date Taking? Authorizing Provider  amLODipine (NORVASC) 10 MG tablet Take 10 mg by mouth daily.  10/15/17   [provider]  atorvastatin (LIPITOR) 40 MG tablet Take 40 mg by mouth daily at 6 PM.  10/15/17   [provider]  fluorometholone (FML) 0.1 % ophthalmic suspension Place 1 drop into the left eye daily. 11/27/17   [provider]  gabapentin (  NEURONTIN) 300 MG capsule Take 300 mg by mouth daily.  10/15/17   [provider]  hydrochlorothiazide (HYDRODIURIL) 12.5 MG tablet Take 12.5 mg by mouth daily.  10/15/17   [provider]  lisinopril (PRINIVIL,ZESTRIL) 40 MG tablet Take 40 mg by mouth daily.  11/14/17   [provider]  nitroGLYCERIN (NITROSTAT) 0.4 MG SL tablet Place 1 tablet (0.4 mg total) under the tongue every 5 (five) minutes x 3 doses as needed for chest pain. 12/27/17   Rai, Ripudeep K, MD  OPTIMAL-D 38250 units capsule  Take 50,000 Units by mouth once a week.  10/15/17   [provider]  pantoprazole (PROTONIX) 20 MG tablet Take 20 mg by mouth daily.      [provider]  propranolol (INDERAL) 20 MG tablet Take 20 mg by mouth at bedtime.     [provider]  risperiDONE (RISPERDAL) 0.5 MG tablet Take 0.5 mg by mouth at bedtime.  11/18/17   [provider]  rivaroxaban (XARELTO) 20 MG TABS tablet Take 1 tablet (20 mg total) by mouth daily with supper. Please fill this prescription after the first starter pack has been completed. 01/16/18   Rai, Vernelle Emerald, MD  Rivaroxaban 15 & 20 MG TBPK Take as directed on package: Start with one 15mg  tablet by mouth twice a day with food. On Day 22 (01/16/2018), switch to one 20mg  tablet once a day with food. 12/27/17   Rai, Vernelle Emerald, MD  sertraline (ZOLOFT) 100 MG tablet Take 100 mg by mouth at bedtime.  11/18/17   [provider]  VENTOLIN HFA 108 (90 Base) MCG/ACT inhaler Inhale 1-2 puffs into the lungs every 6 (six) hours as needed for wheezing or shortness of breath.  10/15/17   [provider]  VESICARE 5 MG tablet Take 5 mg by mouth daily.  10/30/17   [provider]    Family History No family history on file.  Social History Social History   Tobacco Use  . Smoking status: Never Smoker  . Smokeless tobacco: Never Used  Substance Use Topics  . Alcohol use: No  . Drug use: No     Allergies   Hydrocodone, Morphine, and Pineapple   Review of Systems Review of Systems  All other systems reviewed and are negative.    Physical Exam Updated Vital Signs BP (!) 143/88   Pulse 91   Temp 98 F (36.7 C) (Oral)   Resp (!) 21   SpO2 96%   Physical Exam Vitals signs and nursing note reviewed.    73 year old female, resting comfortably and in no acute distress. Vital signs are significant for borderline elevated blood pressure and borderline elevated respiratory rate. Oxygen saturation is 96%, which is  normal. Head is normocephalic and atraumatic. PERRLA, EOMI. Oropharynx is clear.  Tympanic membranes are clear. Neck is tender diffusely.  Neck is supple without adenopathy or JVD. Back is nontender and there is no CVA tenderness. Lungs are clear without rales, wheezes, or rhonchi. Chest is nontender. Heart has regular rate and rhythm without murmur. Abdomen is soft, flat, nontender without masses or hepatosplenomegaly and peristalsis is normoactive. Extremities have no cyanosis or edema, full range of motion is present. Skin is warm and dry without rash. Neurologic: Mental status is normal, cranial nerves are intact, there are no motor or sensory deficits.  Unable to do HINTS because of pain in neck with movement.  ED Treatments / Results  Labs (all labs ordered are  listed, but only abnormal results are displayed) Labs Reviewed  COMPREHENSIVE METABOLIC PANEL - Abnormal; Notable for the following components:      Result Value   Potassium 3.4 (*)    Glucose, Bld 133 (*)    All other components within normal limits  CBC WITH DIFFERENTIAL/PLATELET  TROPONIN I    EKG EKG Interpretation  Date/Time:  Friday January 02 2019 23:59:35 EDT Ventricular Rate:  99 PR Interval:    QRS Duration: 81 QT Interval:  341 QTC Calculation: 438 R Axis:   62 Text Interpretation:  Sinus rhythm Abnormal R-wave progression, early transition When compared with ECG of 12/25/2017, No significant change was found Confirmed by Delora Fuel ([id number]) on 01/03/2019 12:17:50 AM   Radiology Dg Chest 1 View  Result Date: 01/03/2019 CLINICAL DATA:  Chest pain EXAM: CHEST  1 VIEW COMPARISON:  12/24/2017 FINDINGS: Heart and mediastinal contours are within normal limits. No focal opacities or effusions. No acute bony abnormality. IMPRESSION: No active disease. Electronically Signed   By: Rolm Baptise M.D.   On: 01/03/2019 01:35   Ct Head Wo Contrast  Result Date: 01/03/2019 CLINICAL DATA:  Hypertension, dizziness. EXAM:  CT HEAD WITHOUT CONTRAST TECHNIQUE: Contiguous axial images were obtained from the base of the skull through the vertex without intravenous contrast. COMPARISON:  02/03/2010 FINDINGS: Brain: No acute intracranial abnormality. Specifically, no hemorrhage, hydrocephalus, mass lesion, acute infarction, or significant intracranial injury. Vascular: No hyperdense vessel or unexpected calcification. Skull: No acute calvarial abnormality. Sinuses/Orbits: Visualized paranasal sinuses and mastoids clear. Orbital soft tissues unremarkable. Other: None IMPRESSION: No acute intracranial abnormality. Electronically Signed   By: Rolm Baptise M.D.   On: 01/03/2019 01:34    Procedures Procedures   Medications Ordered in ED Medications  ketorolac (TORADOL) 30 MG/ML injection 30 mg (has no administration in time range)  aspirin chewable tablet 324 mg (324 mg Oral Given 01/03/19 0111)  meclizine (ANTIVERT) tablet 25 mg (25 mg Oral Given 01/03/19 0112)  acetaminophen (TYLENOL) tablet 650 mg (650 mg Oral Given 01/03/19 0112)  cyclobenzaprine (FLEXERIL) tablet 10 mg (10 mg Oral Given 01/03/19 0112)  potassium chloride SA (K-DUR) CR tablet 40 mEq (40 mEq Oral Given 01/03/19 0207)     Initial Impression / Assessment and Plan / ED Course  I have reviewed the triage vital signs and the nursing notes.  Pertinent labs & imaging results that were available during my care of the patient were reviewed by me and considered in my medical decision making (see chart for details).  Dizziness which appears to be peripheral vertigo.  No red flags to suggest more serious pathology.  Neck pain was likely related to degenerative joint disease.  Doubt vertebral artery dissection or carotid dissection.  Chest discomfort of uncertain cause.  She clearly has risk factors for coronary artery disease.  Old records are reviewed, and she does have a prior ED visit for her peripheral vertigo and was hospitalized for pulmonary embolism 1 year ago.   Will send for CT of head.  Regarding chest discomfort, ECG is unchanged.  Will check troponin level.  We will give therapeutic trial of meclizine.  Unfortunately, cannot use NSAIDs since she is anticoagulated.  She will be given a dose of acetaminophen and cyclobenzaprine and reassessed.  She feels significantly better after above-noted treatment.  Husband now tells me that she had completed her course of rivaroxaban and is no longer taking any anticoagulants.  She still has significant tenderness in her neck, so she is given  a dose of ketorolac.  CT scan showed no acute process.  Labs are significant only for borderline hypokalemia and she is given a dose of oral potassium.  She is discharged with prescriptions for meclizine, cyclobenzaprine.  She is told to use over-the-counter NSAIDs, apply ice to her neck.  Return precautions discussed.  Follow-up with her PCP as needed.  Final Clinical Impressions(s) / ED Diagnoses   Final diagnoses:  Peripheral vertigo, unspecified laterality  Pain, neck  Pain in right ear    ED Discharge Orders         Ordered    cyclobenzaprine (FLEXERIL) 10 MG tablet  3 times daily PRN     01/03/19 0237    meclizine (ANTIVERT) 25 MG tablet  3 times daily PRN     01/03/19 0370           Delora Fuel, MD 48/88/91 [phone]

## 2019-01-03 NOTE — Discharge Instructions (Addendum)
Aplique hielo en el rea adolorida de su cuello durante treinta minutos a la vez, cuatro veces al [employer].  Tome ibuprofeno o naproxina segn sea necesario para Conservation officer, historic buildings.  Tome acetaminofeno segn sea necesario para Barista.

## 2019-02-06 ENCOUNTER — Other Ambulatory Visit: Payer: Self-pay | Admitting: *Deleted

## 2019-02-06 DIAGNOSIS — Z20822 Contact with and (suspected) exposure to covid-19: Secondary | ICD-10-CM

## 2019-02-12 LAB — NOVEL CORONAVIRUS, NAA: SARS-CoV-2, NAA: NOT DETECTED

## 2019-02-17 ENCOUNTER — Telehealth: Payer: Self-pay

## 2019-02-17 NOTE — Telephone Encounter (Signed)
Pt's husband aalled and informed him that test for Covid 19 was NEGATIVE. Discussed signs and symptoms of Covid 19 : fever, chills, respiratory symptoms, cough, ENT symptoms, sore throat, SOB, muscle pain, diarrhea, headache, loss of taste/smell, close exposure to COVID-19 patient. Pt instructed to call PCP if they develop the above signs and sx. Pt's husband also instructed to call 911 if having respiratory issues/distress. Discussed MyChart enrollment. Pt's husband verbalized understanding.

## 2019-06-02 ENCOUNTER — Other Ambulatory Visit (HOSPITAL_COMMUNITY): Payer: Self-pay | Admitting: Internal Medicine

## 2019-06-02 ENCOUNTER — Other Ambulatory Visit (HOSPITAL_COMMUNITY)
Admission: RE | Admit: 2019-06-02 | Discharge: 2019-06-02 | Disposition: A | Discharge status: Home / Self Care | Payer: Medicare HMO | Source: Ambulatory Visit | Attending: Internal Medicine | Admitting: Internal Medicine

## 2019-06-02 ENCOUNTER — Other Ambulatory Visit: Payer: Self-pay

## 2019-06-02 ENCOUNTER — Ambulatory Visit (HOSPITAL_COMMUNITY)
Admission: RE | Admit: 2019-06-02 | Discharge: 2019-06-02 | Disposition: A | Discharge status: Home / Self Care | Payer: Medicare HMO | Source: Ambulatory Visit | Attending: Internal Medicine | Admitting: Internal Medicine

## 2019-06-02 ENCOUNTER — Encounter (HOSPITAL_COMMUNITY): Payer: Self-pay

## 2019-06-02 DIAGNOSIS — F331 Major depressive disorder, recurrent, moderate: Secondary | ICD-10-CM | POA: Diagnosis not present

## 2019-06-02 DIAGNOSIS — R0789 Other chest pain: Secondary | ICD-10-CM | POA: Diagnosis not present

## 2019-06-02 DIAGNOSIS — I10 Essential (primary) hypertension: Secondary | ICD-10-CM | POA: Insufficient documentation

## 2019-06-02 DIAGNOSIS — Z0001 Encounter for general adult medical examination with abnormal findings: Secondary | ICD-10-CM | POA: Insufficient documentation

## 2019-06-02 DIAGNOSIS — E785 Hyperlipidemia, unspecified: Secondary | ICD-10-CM | POA: Insufficient documentation

## 2019-06-02 DIAGNOSIS — Z1389 Encounter for screening for other disorder: Secondary | ICD-10-CM | POA: Diagnosis not present

## 2019-06-02 DIAGNOSIS — Z1331 Encounter for screening for depression: Secondary | ICD-10-CM | POA: Diagnosis not present

## 2019-06-02 DIAGNOSIS — Z23 Encounter for immunization: Secondary | ICD-10-CM | POA: Diagnosis not present

## 2019-06-02 DIAGNOSIS — Z6841 Body Mass Index (BMI) 40.0 and over, adult: Secondary | ICD-10-CM | POA: Diagnosis not present

## 2019-06-02 LAB — CBC WITH DIFFERENTIAL/PLATELET
Abs Immature Granulocytes: 0.01 10*3/uL (ref 0.00–0.07)
Basophils Absolute: 0.1 10*3/uL (ref 0.0–0.1)
Basophils Relative: 1 %
Eosinophils Absolute: 0.4 10*3/uL (ref 0.0–0.5)
Eosinophils Relative: 5 %
HCT: 45 % (ref 36.0–46.0)
Hemoglobin: 14.3 g/dL (ref 12.0–15.0)
Immature Granulocytes: 0 %
Lymphocytes Relative: 19 %
Lymphs Abs: 1.5 10*3/uL (ref 0.7–4.0)
MCH: 31 pg (ref 26.0–34.0)
MCHC: 31.8 g/dL (ref 30.0–36.0)
MCV: 97.6 fL (ref 80.0–100.0)
Monocytes Absolute: 0.7 10*3/uL (ref 0.1–1.0)
Monocytes Relative: 9 %
Neutro Abs: 5.3 10*3/uL (ref 1.7–7.7)
Neutrophils Relative %: 66 %
Platelets: 305 10*3/uL (ref 150–400)
RBC: 4.61 MIL/uL (ref 3.87–5.11)
RDW: 14.7 % (ref 11.5–15.5)
WBC: 7.9 10*3/uL (ref 4.0–10.5)
nRBC: 0 % (ref 0.0–0.2)

## 2019-06-02 LAB — HEPATIC FUNCTION PANEL
ALT: 17 U/L (ref 0–44)
AST: 20 U/L (ref 15–41)
Albumin: 4 g/dL (ref 3.5–5.0)
Alkaline Phosphatase: 106 U/L (ref 38–126)
Bilirubin, Direct: 0.1 mg/dL (ref 0.0–0.2)
Indirect Bilirubin: 0.9 mg/dL (ref 0.3–0.9)
Total Bilirubin: 1 mg/dL (ref 0.3–1.2)
Total Protein: 7 g/dL (ref 6.5–8.1)

## 2019-06-02 LAB — BASIC METABOLIC PANEL
Anion gap: 9 (ref 5–15)
BUN: 14 mg/dL (ref 8–23)
CO2: 27 mmol/L (ref 22–32)
Calcium: 9.2 mg/dL (ref 8.9–10.3)
Chloride: 100 mmol/L (ref 98–111)
Creatinine, Ser: 0.62 mg/dL (ref 0.44–1.00)
GFR calc Af Amer: >60 mL/min (ref >60)
GFR calc non Af Amer: >60 mL/min (ref >60)
Glucose, Bld: 93 mg/dL (ref 70–99)
Potassium: 4.1 mmol/L (ref 3.5–5.1)
Sodium: 136 mmol/L (ref 135–145)

## 2019-06-02 LAB — LIPID PANEL
Cholesterol: 222 mg/dL — ABNORMAL HIGH (ref 0–200)
HDL: 54 mg/dL (ref >40)
LDL Cholesterol: 132 mg/dL — ABNORMAL HIGH (ref 0–99)
Total CHOL/HDL Ratio: 4.1 RATIO
Triglycerides: 181 mg/dL — ABNORMAL HIGH (ref <150)
VLDL: 36 mg/dL (ref 0–40)

## 2019-06-02 LAB — TSH: TSH: 0.668 u[IU]/mL (ref 0.350–4.500)

## 2019-06-03 LAB — T4: T4, Total: 8.4 ug/dL (ref 4.5–12.0)

## 2019-06-12 ENCOUNTER — Other Ambulatory Visit (HOSPITAL_COMMUNITY): Payer: Self-pay | Admitting: Internal Medicine

## 2019-06-12 DIAGNOSIS — N644 Mastodynia: Secondary | ICD-10-CM

## 2019-06-30 ENCOUNTER — Ambulatory Visit (HOSPITAL_COMMUNITY)
Admission: RE | Admit: 2019-06-30 | Discharge: 2019-06-30 | Disposition: A | Discharge status: Home / Self Care | Payer: Medicare HMO | Source: Ambulatory Visit | Attending: Internal Medicine | Admitting: Internal Medicine

## 2019-06-30 ENCOUNTER — Ambulatory Visit (HOSPITAL_COMMUNITY): Payer: Medicare HMO

## 2019-06-30 ENCOUNTER — Other Ambulatory Visit: Payer: Self-pay

## 2019-06-30 DIAGNOSIS — N644 Mastodynia: Secondary | ICD-10-CM | POA: Insufficient documentation

## 2019-06-30 DIAGNOSIS — R928 Other abnormal and inconclusive findings on diagnostic imaging of breast: Secondary | ICD-10-CM | POA: Diagnosis not present

## 2019-07-02 DIAGNOSIS — M549 Dorsalgia, unspecified: Secondary | ICD-10-CM | POA: Diagnosis not present

## 2019-07-02 DIAGNOSIS — I10 Essential (primary) hypertension: Secondary | ICD-10-CM | POA: Diagnosis not present

## 2019-08-02 DIAGNOSIS — E785 Hyperlipidemia, unspecified: Secondary | ICD-10-CM | POA: Diagnosis not present

## 2019-08-02 DIAGNOSIS — M549 Dorsalgia, unspecified: Secondary | ICD-10-CM | POA: Diagnosis not present

## 2019-09-02 DIAGNOSIS — M549 Dorsalgia, unspecified: Secondary | ICD-10-CM | POA: Diagnosis not present

## 2019-09-02 DIAGNOSIS — E039 Hypothyroidism, unspecified: Secondary | ICD-10-CM | POA: Diagnosis not present

## 2019-09-30 DIAGNOSIS — E039 Hypothyroidism, unspecified: Secondary | ICD-10-CM | POA: Diagnosis not present

## 2019-09-30 DIAGNOSIS — M549 Dorsalgia, unspecified: Secondary | ICD-10-CM | POA: Diagnosis not present

## 2019-10-31 DIAGNOSIS — M549 Dorsalgia, unspecified: Secondary | ICD-10-CM | POA: Diagnosis not present

## 2019-10-31 DIAGNOSIS — E039 Hypothyroidism, unspecified: Secondary | ICD-10-CM | POA: Diagnosis not present

## 2019-10-31 DIAGNOSIS — I10 Essential (primary) hypertension: Secondary | ICD-10-CM | POA: Diagnosis not present

## 2019-11-22 ENCOUNTER — Other Ambulatory Visit: Payer: Self-pay

## 2019-11-22 ENCOUNTER — Emergency Department (HOSPITAL_COMMUNITY)
Admission: EM | Admit: 2019-11-22 | Discharge: 2019-11-22 | Disposition: A | Discharge status: Home / Self Care | Payer: Medicare HMO | Attending: Emergency Medicine | Admitting: Emergency Medicine

## 2019-11-22 ENCOUNTER — Emergency Department (HOSPITAL_COMMUNITY): Payer: Medicare HMO

## 2019-11-22 ENCOUNTER — Encounter (HOSPITAL_COMMUNITY): Payer: Self-pay | Admitting: *Deleted

## 2019-11-22 DIAGNOSIS — F329 Major depressive disorder, single episode, unspecified: Secondary | ICD-10-CM | POA: Diagnosis not present

## 2019-11-22 DIAGNOSIS — R519 Headache, unspecified: Secondary | ICD-10-CM | POA: Diagnosis not present

## 2019-11-22 DIAGNOSIS — Z79899 Other long term (current) drug therapy: Secondary | ICD-10-CM | POA: Insufficient documentation

## 2019-11-22 DIAGNOSIS — F32A Depression, unspecified: Secondary | ICD-10-CM

## 2019-11-22 DIAGNOSIS — R791 Abnormal coagulation profile: Secondary | ICD-10-CM | POA: Diagnosis not present

## 2019-11-22 DIAGNOSIS — I10 Essential (primary) hypertension: Secondary | ICD-10-CM | POA: Diagnosis not present

## 2019-11-22 DIAGNOSIS — R0602 Shortness of breath: Secondary | ICD-10-CM | POA: Diagnosis not present

## 2019-11-22 DIAGNOSIS — F341 Dysthymic disorder: Secondary | ICD-10-CM | POA: Diagnosis not present

## 2019-11-22 DIAGNOSIS — R079 Chest pain, unspecified: Secondary | ICD-10-CM | POA: Diagnosis not present

## 2019-11-22 LAB — COMPREHENSIVE METABOLIC PANEL
ALT: 17 U/L (ref 0–44)
AST: 19 U/L (ref 15–41)
Albumin: 3.8 g/dL (ref 3.5–5.0)
Alkaline Phosphatase: 122 U/L (ref 38–126)
Anion gap: 6 (ref 5–15)
BUN: 21 mg/dL (ref 8–23)
CO2: 32 mmol/L (ref 22–32)
Calcium: 9.1 mg/dL (ref 8.9–10.3)
Chloride: 101 mmol/L (ref 98–111)
Creatinine, Ser: 0.66 mg/dL (ref 0.44–1.00)
GFR calc Af Amer: >60 mL/min (ref >60)
GFR calc non Af Amer: >60 mL/min (ref >60)
Glucose, Bld: 99 mg/dL (ref 70–99)
Potassium: 4.5 mmol/L (ref 3.5–5.1)
Sodium: 139 mmol/L (ref 135–145)
Total Bilirubin: 0.6 mg/dL (ref 0.3–1.2)
Total Protein: 6.9 g/dL (ref 6.5–8.1)

## 2019-11-22 LAB — DIFFERENTIAL
Abs Immature Granulocytes: 0.02 10*3/uL (ref 0.00–0.07)
Basophils Absolute: 0.1 10*3/uL (ref 0.0–0.1)
Basophils Relative: 1 %
Eosinophils Absolute: 0.5 10*3/uL (ref 0.0–0.5)
Eosinophils Relative: 7 %
Immature Granulocytes: 0 %
Lymphocytes Relative: 21 %
Lymphs Abs: 1.7 10*3/uL (ref 0.7–4.0)
Monocytes Absolute: 0.6 10*3/uL (ref 0.1–1.0)
Monocytes Relative: 7 %
Neutro Abs: 5.3 10*3/uL (ref 1.7–7.7)
Neutrophils Relative %: 64 %

## 2019-11-22 LAB — PROTIME-INR
INR: 0.9 (ref 0.8–1.2)
Prothrombin Time: 12.1 seconds (ref 11.4–15.2)

## 2019-11-22 LAB — CBC
HCT: 44.6 % (ref 36.0–46.0)
Hemoglobin: 14.4 g/dL (ref 12.0–15.0)
MCH: 31.7 pg (ref 26.0–34.0)
MCHC: 32.3 g/dL (ref 30.0–36.0)
MCV: 98.2 fL (ref 80.0–100.0)
Platelets: 263 10*3/uL (ref 150–400)
RBC: 4.54 MIL/uL (ref 3.87–5.11)
RDW: 14.6 % (ref 11.5–15.5)
WBC: 8.2 10*3/uL (ref 4.0–10.5)
nRBC: 0 % (ref 0.0–0.2)

## 2019-11-22 LAB — ETHANOL: Alcohol, Ethyl (B): <10 mg/dL (ref <10)

## 2019-11-22 LAB — TROPONIN I (HIGH SENSITIVITY)
Troponin I (High Sensitivity): 4 ng/L (ref <18)
Troponin I (High Sensitivity): 4 ng/L (ref <18)

## 2019-11-22 LAB — APTT: aPTT: 27 seconds (ref 24–36)

## 2019-11-22 MED ORDER — KETOROLAC TROMETHAMINE 30 MG/ML IJ SOLN
15.0000 mg | Freq: Once | INTRAMUSCULAR | Status: AC
  Administered 2019-11-22: 15 mg via INTRAVENOUS
  Filled 2019-11-22: qty 1

## 2019-11-22 MED ORDER — AMITRIPTYLINE HCL 50 MG PO TABS
50.0000 mg | ORAL_TABLET | Freq: Every day | ORAL | 1 refills | Status: DC
Start: 2019-11-22 — End: 2020-03-31

## 2019-11-22 MED ORDER — LISINOPRIL 20 MG PO TABS: 20.0000 mg | ORAL_TABLET | Freq: Every day | ORAL | 1 refills | Status: DC

## 2019-11-22 MED ORDER — LORAZEPAM 2 MG/ML IJ SOLN
0.5000 mg | Freq: Once | INTRAMUSCULAR | Status: AC
  Administered 2019-11-22: 0.5 mg via INTRAVENOUS
  Filled 2019-11-22: qty 1

## 2019-11-22 MED ORDER — SERTRALINE HCL 50 MG PO TABS: 50.0000 mg | ORAL_TABLET | Freq: Every day | ORAL | 1 refills | Status: DC

## 2019-11-22 NOTE — ED Triage Notes (Signed)
Pt here for HTN per husband, reported by husband that Dr. Legrand Rams had decreased Lisinopril from 40 mg to 10mg  few months ago.  C/o HA. 194/125 a week ago at home

## 2019-11-22 NOTE — ED Provider Notes (Signed)
[facility] EMERGENCY DEPARTMENT Provider Note   CSN: [id number] Arrival date & time: 11/22/19  1524     History Chief Complaint  Patient presents with  . Hypertension    Kendra [name] is a 74 y.o. female who presents with multiple complaints. Pt is Spanish speaking - her husband is at bedside and helps interpret. He states that her Lisinopril dose was increased to 40mg  a year ago when they were in [place]. This has been controlling her blood pressure however 3-4 months ago this medicine was decreased to 10mg  once daily by their PCP but they are not sure why. She takes this medicine once every morning however by the afternoon/evening it will increase and be in the 180-190s/110-120s. Her husband called their clinic and asked them to increased the dose however when they sent in refills it was still the 10mg . This morning she took an additional 10mg  and BP in triage was 144/74.   Additionally her huband states that the patient is depressed and anxious. She is frequently crying and nervous. She was previosuly on Setraline and Amytriptiline but she did not get any refills for this and has been out of it for an unknown amount of time. Her husband states that her depression stems from losing their only daughter 7 years ago. Pt denies SI/HI.  Pt also has multiple physical complaints that have been waxing and waning over the past couple of days. She reports a left sided headache, dizziness, blurry vision, weakness of her left arm, chest pain, SOB, left sided abdominal pain, nausea, cramping in her hands when she urinates, and difficulty walking. She denies any passing out, change in bowel habits, urinary symptoms.  HPI     Past Medical History:  Diagnosis Date  . Arthritis   . Chronic back pain   . 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 Depression   . GERD (gastroesophageal reflux disease)   . Hypercholesterolemia   . Hypertension   . Kidney stone   .  Migraine   . Panic attacks    daily, worse the 15th of each month  . Thyroid disease     Patient Active Problem List   Diagnosis Date Noted  . DVT (deep venous thrombosis) ([place]) 12/26/2017  . Acute pulmonary embolism without acute cor pulmonale (HCC)   . Chest pain 12/25/2017  . Essential hypertension 12/25/2017  . Mixed hyperlipidemia 12/25/2017  . Nonspecific chest pain   . ABSCESS, TOOTH 09/06/2010  . OBESITY 07/12/2010  . CHOLECYSTECTOMY, HX OF 04/25/2010  . DEPRESSION 03/17/2010  . HYPERTENSION, BENIGN ESSENTIAL 10/28/2009  . ALLERGIC RHINITIS 10/12/2009  . SLEEP APNEA 10/12/2009  . HYPERPARATHYROIDISM UNSPECIFIED 07/19/2009  . KNEE PAIN, BILATERAL 01/11/2009  . LABYRINTHITIS, ACUTE 09/29/2008  . BACK PAIN, LUMBAR 08/03/2008  . CALCANEAL SPUR, RIGHT 07/14/2008  . OSTEOPENIA 07/14/2008  . 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 GASTROENTERITIS, ACUTE 01/08/2008  . HEMATURIA UNSPECIFIED 01/08/2008  . TEMPOROMANDIBULAR JOINT PAIN 07/09/2007  . DYSLIPIDEMIA 03/07/2007  . GERD 03/07/2007  . OSTEOARTHROSIS, LOCAL, PRIMARY, LOWER LEG 03/07/2007  . MIGRAINE HEADACHE 03/03/2007  . CERVICAL MUSCLE STRAIN 03/03/2007  . HELICOBACTER PYLORI INFECTION, HX OF 12/21/2005    Past Surgical History:  Procedure Laterality Date  . ABDOMINAL HYSTERECTOMY    . APPENDECTOMY    . CHOLECYSTECTOMY    . CYSTOSCOPY    . LEFT HEART CATH AND CORONARY ANGIOGRAPHY N/A 12/26/2017   Procedure: LEFT HEART CATH AND CORONARY ANGIOGRAPHY;  Surgeon:  Lorretta Harp, MD;  Location: [facility];  Service: Cardiovascular;  Laterality: N/A;  . THYROID SURGERY       OB History   No obstetric history on file.     History reviewed. No pertinent family history.  Social History   Tobacco Use  . Smoking status: Never Smoker  . Smokeless tobacco: Never Used  Substance Use Topics  . Alcohol use: No  . Drug use: No    Home Medications Prior to Admission medications   Medication Sig Start Date End  Date Taking? Authorizing Provider  amLODipine (NORVASC) 10 MG tablet Take 10 mg by mouth daily.  10/15/17   [provider]  atorvastatin (LIPITOR) 40 MG tablet Take 40 mg by mouth daily at 6 PM.  10/15/17   [provider]  cyclobenzaprine (FLEXERIL) 10 MG tablet Take 1 tablet (10 mg total) by mouth 3 (three) times daily as needed for muscle spasms. 123456   Delora Fuel, MD  fluorometholone (FML) 0.1 % ophthalmic suspension Place 1 drop into the left eye daily. 11/27/17   [provider]  gabapentin (NEURONTIN) 300 MG capsule Take 300 mg by mouth daily.  10/15/17   [provider]  hydrochlorothiazide (HYDRODIURIL) 12.5 MG tablet Take 12.5 mg by mouth daily.  10/15/17   [provider]  lisinopril (PRINIVIL,ZESTRIL) 40 MG tablet Take 40 mg by mouth daily.  11/14/17   [provider]  meclizine (ANTIVERT) 25 MG tablet Take 1 tablet (25 mg total) by mouth 3 (three) times daily as needed for dizziness. 123456   Delora Fuel, MD  nitroGLYCERIN (NITROSTAT) 0.4 MG SL tablet Place 1 tablet (0.4 mg total) under the tongue every 5 (five) minutes x 3 doses as needed for chest pain. 12/27/17   Rai, Ripudeep K, MD  OPTIMAL-D 29562 units capsule Take 50,000 Units by mouth once a week.  10/15/17   [provider]  pantoprazole (PROTONIX) 20 MG tablet Take 20 mg by mouth daily.      [provider]  propranolol (INDERAL) 20 MG tablet Take 20 mg by mouth at bedtime.     [provider]  risperiDONE (RISPERDAL) 0.5 MG tablet Take 0.5 mg by mouth at bedtime.  11/18/17   [provider]  sertraline (ZOLOFT) 100 MG tablet Take 100 mg by mouth at bedtime.  11/18/17   [provider]  VENTOLIN HFA 108 (90 Base) MCG/ACT inhaler Inhale 1-2 puffs into the lungs every 6 (six) hours as needed for wheezing or shortness of breath.  10/15/17   [provider]  VESICARE 5 MG tablet Take 5 mg by mouth daily.  10/30/17   [provider]  rivaroxaban (XARELTO) 20 MG TABS tablet Take 1 tablet (20 mg total) by mouth daily with supper. Please fill this prescription after the first starter pack has been completed. 01/16/18 01/03/19  Mendel Corning, MD    Allergies    Hydrocodone, Morphine, and Pineapple  Review of Systems   Review of Systems  Constitutional: Positive for fatigue. Negative for fever.  Eyes: Positive for visual disturbance.  Respiratory: Positive for shortness of breath.   Cardiovascular: Positive for chest pain.  Gastrointestinal: Positive for abdominal pain and nausea. Negative for constipation, diarrhea and vomiting.  Genitourinary: Negative for difficulty urinating and dysuria.  Musculoskeletal: Positive for gait problem, myalgias and neck pain.  Neurological: Positive for dizziness, weakness, numbness and headaches.  Psychiatric/Behavioral: Positive for dysphoric mood. The patient is nervous/anxious.  All other systems reviewed and are negative.   Physical Exam Updated Vital Signs BP (!) 144/74 (BP Location: Left Arm)   Pulse 73   Temp 98 F (36.7 C) (Oral)   Resp 16   Ht 5\' 3"  (1.6 m)   Wt 108.9 kg   SpO2 97%   BMI 42.51 kg/m   Physical Exam Vitals and nursing note reviewed.  Constitutional:      General: She is not in acute distress.    Appearance: She is well-developed. She is obese. She is not ill-appearing.     Comments: Calm, cooperative. NAD. Spanish speaking  HENT:     Head: Normocephalic and atraumatic.  Eyes:     General: No scleral icterus.       Right eye: No discharge.        Left eye: No discharge.     Conjunctiva/sclera: Conjunctivae normal.     Pupils: Pupils are equal, round, and reactive to light.  Cardiovascular:     Rate and Rhythm: Normal rate and regular rhythm.  Pulmonary:     Effort: Pulmonary effort is normal. No respiratory distress.     Breath sounds: Normal breath sounds.  Abdominal:     General: There is no distension.     Palpations:  Abdomen is soft.     Tenderness: There is no abdominal tenderness.  Musculoskeletal:     Cervical back: Normal range of motion.  Skin:    General: Skin is warm and dry.  Neurological:     Mental Status: She is alert and oriented to person, place, and time.     Comments: Mental Status:  Alert, oriented, thought content appropriate, able to give a coherent history. Speech fluent without evidence of aphasia. Able to follow 2 step commands without difficulty. Inconsistent neuro exam Cranial Nerves:  II:  Peripheral visual fields grossly normal, pupils equal, round, reactive to light III,IV, VI: ptosis not present, extra-ocular motions intact bilaterally  V,VII: smile symmetric, facial light touch sensation equal VIII: hearing grossly normal to voice  X: uvula elevates symmetrically  XI: bilateral shoulder shrug symmetric and strong XII: midline tongue extension without fassiculations Motor:  Normal tone. 5/5 in upper and lower extremities bilaterally including strong and equal grip strength and dorsiflexion/plantar flexion Sensory: Pinprick and light touch normal in all extremities.  Cerebellar: normal finger-to-nose with in right upper extremity. Dysmetria in LUE. Gait: generally weak, stating she feels like she is going to fall CV: distal pulses palpable throughout    Psychiatric:        Behavior: Behavior normal.     ED Results / Procedures / Treatments   Labs (all labs ordered are listed, but only abnormal results are displayed) Labs Reviewed  ETHANOL  PROTIME-INR  APTT  CBC  DIFFERENTIAL  COMPREHENSIVE METABOLIC PANEL  TROPONIN I (HIGH SENSITIVITY)  TROPONIN I (HIGH SENSITIVITY)    EKG EKG Interpretation  Date/Time:  Sunday Nov 22 2019 17:16:16 EDT Ventricular Rate:  63 PR Interval:    QRS Duration: 73 QT Interval:  434 QTC Calculation: 445 R Axis:   56 Text Interpretation: Sinus rhythm Consider left atrial enlargement Minimal ST elevation, inferior leads  Baseline wander in lead(s) V4 No significant change since last tracing Confirmed by Calvert Cantor [phone]) on 11/22/2019 5:23:30 PM   Radiology DG Chest 2 View  Result Date: 11/22/2019 CLINICAL DATA:  Chest pain and shortness of breath. EXAM: CHEST - 2 VIEW COMPARISON:  June 02, 2019 FINDINGS: The heart size and mediastinal  contours are within normal limits. Both lungs are clear. The visualized skeletal structures are unremarkable. There are atherosclerotic changes of the thoracic aorta. IMPRESSION: No active cardiopulmonary disease. Electronically Signed   By: Constance Holster M.D.   On: 11/22/2019 17:37   CT Head Wo Contrast  Result Date: 11/22/2019 CLINICAL DATA:  Headache. EXAM: CT HEAD WITHOUT CONTRAST TECHNIQUE: Contiguous axial images were obtained from the base of the skull through the vertex without intravenous contrast. COMPARISON:  July 13, 2008. January 03, 2019. FINDINGS: Brain: No evidence of acute infarction, hemorrhage, hydrocephalus, extra-axial collection or mass lesion/mass effect. Vascular: No hyperdense vessel or unexpected calcification. Skull: Normal. Negative for fracture or focal lesion. Sinuses/Orbits: No acute finding. Other: None. IMPRESSION: No acute intracranial pathology. Electronically Signed   By: Constance Holster M.D.   On: 11/22/2019 17:46    Procedures Procedures (including critical care time)  Medications Ordered in ED Medications  ketorolac (TORADOL) 30 MG/ML injection 15 mg (15 mg Intravenous Given 11/22/19 1854)  LORazepam (ATIVAN) injection 0.5 mg (0.5 mg Intravenous Given 11/22/19 1854)    ED Course  I have reviewed the triage vital signs and the nursing notes.  Pertinent labs & imaging results that were available during my care of the patient were reviewed by me and considered in my medical decision making (see chart for details).  74 year old female presents with her husband for medication refills and multiple complaints.  Patient has pan  positive review of systems.  On exam she is depressed appearing.  Neurologic exam is remarkable for left-sided tremor and dysmetria however her exam is very inconsistent.  Blood pressure is minimally elevated here and other vital signs are normal.  Heart is regular rate and rhythm.  Lungs are clear to auscultation.  Abdomen is soft and nontender.  Will order labs, CT head, chest x-ray, EKG  EKG is sinus rhythm.  Chest x-ray is clear.  Labs completely normal.  CT head is negative.  Patient was reevaluated and is feeling better.  We will give her refills for sertraline and amitriptyline.  I am hesitant to increase her dose back to 40 mg from 10.  Advised I will give him 20 mg prescription and strongly encouraged him to make up appointment with their PCP to discuss further medication changes.  They verbalized understanding.  MDM Rules/Calculators/A&P                       Final Clinical Impression(s) / ED Diagnoses Final diagnoses:  Hypertension, unspecified type  Depression, unspecified depression type    Rx / DC Orders ED Discharge Orders    None       Iris Pert 11/22/19 2039    Truddie Hidden, MD 11/22/19 2208

## 2019-11-22 NOTE — Discharge Instructions (Signed)
Please start Lisinopril 20mg  once daily Start Setraline and Amitriptyline once daily for depression and anxiety - watch for worsening signs of depression in the first couple of weeks after starting these medicines Please follow up with your family doctor

## 2019-12-16 DIAGNOSIS — F331 Major depressive disorder, recurrent, moderate: Secondary | ICD-10-CM | POA: Diagnosis not present

## 2019-12-16 DIAGNOSIS — M549 Dorsalgia, unspecified: Secondary | ICD-10-CM | POA: Diagnosis not present

## 2019-12-16 DIAGNOSIS — Z79899 Other long term (current) drug therapy: Secondary | ICD-10-CM | POA: Diagnosis not present

## 2019-12-16 DIAGNOSIS — Z0001 Encounter for general adult medical examination with abnormal findings: Secondary | ICD-10-CM | POA: Diagnosis not present

## 2019-12-16 DIAGNOSIS — I10 Essential (primary) hypertension: Secondary | ICD-10-CM | POA: Diagnosis not present

## 2019-12-16 DIAGNOSIS — E785 Hyperlipidemia, unspecified: Secondary | ICD-10-CM | POA: Diagnosis not present

## 2020-01-07 ENCOUNTER — Other Ambulatory Visit: Payer: Self-pay

## 2020-01-07 ENCOUNTER — Encounter: Payer: Self-pay | Admitting: Family Medicine

## 2020-01-07 ENCOUNTER — Ambulatory Visit (INDEPENDENT_AMBULATORY_CARE_PROVIDER_SITE_OTHER): Payer: Medicare HMO | Admitting: Family Medicine

## 2020-01-07 VITALS — BP 144/89 | HR 79 | Temp 97.3°F | Ht 63.0 in | Wt 235.0 lb

## 2020-01-07 DIAGNOSIS — Z9989 Dependence on other enabling machines and devices: Secondary | ICD-10-CM

## 2020-01-07 DIAGNOSIS — R079 Chest pain, unspecified: Secondary | ICD-10-CM

## 2020-01-07 DIAGNOSIS — E782 Mixed hyperlipidemia: Secondary | ICD-10-CM | POA: Diagnosis not present

## 2020-01-07 DIAGNOSIS — K219 Gastro-esophageal reflux disease without esophagitis: Secondary | ICD-10-CM

## 2020-01-07 DIAGNOSIS — G4733 Obstructive sleep apnea (adult) (pediatric): Secondary | ICD-10-CM | POA: Diagnosis not present

## 2020-01-07 DIAGNOSIS — I10 Essential (primary) hypertension: Secondary | ICD-10-CM | POA: Diagnosis not present

## 2020-01-07 DIAGNOSIS — F3341 Major depressive disorder, recurrent, in partial remission: Secondary | ICD-10-CM

## 2020-01-07 MED ORDER — VENTOLIN HFA 108 (90 BASE) MCG/ACT IN AERS: 1.0000 | INHALATION_SPRAY | Freq: Four times a day (QID) | RESPIRATORY_TRACT | 0 refills | Status: DC | PRN

## 2020-01-07 MED ORDER — PRAVASTATIN SODIUM 20 MG PO TABS: 20.0000 mg | ORAL_TABLET | Freq: Every day | ORAL | 3 refills | Status: DC

## 2020-01-07 MED ORDER — PROPRANOLOL HCL 20 MG PO TABS: 20.0000 mg | ORAL_TABLET | Freq: Two times a day (BID) | ORAL | 1 refills | Status: DC

## 2020-01-07 MED ORDER — SERTRALINE HCL 100 MG PO TABS: 100.0000 mg | ORAL_TABLET | Freq: Every day | ORAL | 1 refills | Status: DC

## 2020-01-07 NOTE — Patient Instructions (Signed)
° ° ° °  If you have lab work done today you will be contacted with your lab results within the next 2 weeks.  If you have not heard from us then please contact us. The fastest way to get your results is to register for My Chart. ° ° °IF you received an x-ray today, you will receive an invoice from [facility]. Please contact [facility] at [phone] with questions or concerns regarding your invoice.  ° °IF you received labwork today, you will receive an invoice from [facility]. Please contact [facility] at [phone] with questions or concerns regarding your invoice.  ° °Our billing staff will not be able to assist you with questions regarding bills from these companies. ° °You will be contacted with the lab results as soon as they are available. The fastest way to get your results is to activate your My Chart account. Instructions are located on the last page of this paperwork. If you have not heard from us regarding the results in 2 weeks, please contact this office. °  ° ° ° °

## 2020-01-07 NOTE — Progress Notes (Signed)
6/17/20213:25 PM  Kendra [name] 1945/07/30, 74 y.o., female [id number]  Chief Complaint  Patient presents with   New Patient (Initial Visit)    HPI:   Patient is a 74 y.o. female with past medical history significant for HTN, HLP, nonobstrutive CAD,back pain, UI, migraine,  R DVT/PE in 2019, OSA on cpap, depression, GERD, kidney stones who presents today to establish care  Previous PCP Dr Legrand Rams, last OV April 2021  Reports  PE after flight to [place] Ventolin started by pulm in [place] after PFTs done - denies dx of asthma or COPD, non smoker, uses albuterol very prn for SOB Amitriptyline, sertraline and risperidone for depression, started by psychiatry in [place], sertraline decreased to 50mg  for unclear reasons, she would like to increase back to 100mg , PHQ9 noted Propranolol for migraines - used to take BID but was decreased by previous PCP due to concerns for polypharmacy however migraines have returned Reports partial thyroidectomy - benign path, has never taken meds for thyroid issues Left cath 2019 - 30% stenosis of LAD Echo 2019 - LVEF 60-65% holter 2019 unremarkable Taking NTG more often - having DOE, palpitations, elevated BP, resolves with NTG Does not have an established cardiologist   Depression screen [facility] 2/9 01/07/2020  Decreased Interest 3  Down, Depressed, Hopeless 3  PHQ - 2 Score 6  Altered sleeping 3  Tired, decreased energy 3  Change in appetite 3  Feeling bad or failure about yourself  3  Trouble concentrating 3  Moving slowly or fidgety/restless 2  Suicidal thoughts 0  PHQ-9 Score 23  Difficult doing work/chores Very difficult    Fall Risk  01/07/2020  Falls in the past year? 0  Number falls in past yr: 0  Injury with Fall? 0  Follow up Falls evaluation completed     Allergies  Allergen Reactions   Hydrocodone Nausea And Vomiting   Morphine Swelling    REACTION: swelling   Pineapple Swelling    Throat swelling    Prior to Admission  medications   Medication Sig Start Date End Date Taking? Authorizing Provider  amitriptyline (ELAVIL) 50 MG tablet Take 1 tablet (50 mg total) by mouth at bedtime. 11/22/19  Yes Recardo Evangelist, PA-C  fluorometholone (FML) 0.1 % ophthalmic suspension Place 1 drop into the left eye daily. 11/27/17  Yes [provider]  gabapentin (NEURONTIN) 300 MG capsule Take 300 mg by mouth daily.  10/15/17  Yes [provider]  lisinopril (ZESTRIL) 20 MG tablet Take 1 tablet (20 mg total) by mouth daily. 11/22/19  Yes Recardo Evangelist, PA-C  nitroGLYCERIN (NITROSTAT) 0.4 MG SL tablet Place 1 tablet (0.4 mg total) under the tongue every 5 (five) minutes x 3 doses as needed for chest pain. 12/27/17  Yes Rai, Ripudeep K, MD  OPTIMAL-D 40981 units capsule Take 50,000 Units by mouth once a week.  10/15/17  Yes [provider]  pantoprazole (PROTONIX) 40 MG tablet Take 40 mg by mouth daily. 09/29/19  Yes [provider]  propranolol (INDERAL) 20 MG tablet Take 20 mg by mouth at bedtime.    Yes [provider]  risperiDONE (RISPERDAL) 0.5 MG tablet Take 0.5 mg by mouth at bedtime.  11/18/17  Yes [provider]  sertraline (ZOLOFT) 50 MG tablet Take 1 tablet (50 mg total) by mouth daily. 11/22/19  Yes Recardo Evangelist, PA-C  solifenacin (VESICARE) 10 MG tablet Take 10 mg by mouth daily. 09/20/19  Yes [provider]  Enid Cutter  HFA 108 (90 Base) MCG/ACT inhaler Inhale 1-2 puffs into the lungs every 6 (six) hours as needed for wheezing or shortness of breath.  10/15/17  Yes [provider]  pravastatin (PRAVACHOL) 20 MG tablet Take 20 mg by mouth daily.    [provider]  rivaroxaban (XARELTO) 20 MG TABS tablet Take 1 tablet (20 mg total) by mouth daily with supper. Please fill this prescription after the first starter pack has been completed. 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 daughter's death in 2009/10/24   Depression    GERD (gastroesophageal reflux disease)    Hypercholesterolemia    Hypertension    Kidney stone    Migraine    Panic attacks    daily, worse the 15th of each month   Thyroid disease     Past Surgical History:  Procedure Laterality Date   ABDOMINAL HYSTERECTOMY     r ovary removal   APPENDECTOMY     CHOLECYSTECTOMY     CYSTOSCOPY     LEFT HEART CATH AND CORONARY ANGIOGRAPHY N/A 12/26/2017   Procedure: LEFT HEART CATH AND CORONARY ANGIOGRAPHY;  Surgeon: Lorretta Harp, MD;  Location: [facility];  Service: Cardiovascular;  Laterality: N/A;   THYROID SURGERY      Social History   Tobacco Use   Smoking status: Never Smoker   Smokeless tobacco: Never Used  Substance Use Topics   Alcohol use: No    Family History  Problem Relation Age of Onset   Arthritis Mother    Migraines Mother    Cancer Sister    Cancer Maternal Grandmother     Review of Systems  Constitutional: Negative for chills and fever.  Respiratory: Positive for shortness of breath. Negative for cough.   Cardiovascular: Positive for chest pain. Negative for palpitations and leg swelling.  Gastrointestinal: Negative for abdominal pain, nausea and vomiting.  per hpi   OBJECTIVE:  Today's Vitals   01/07/20 1501  BP: (!) 144/89  Pulse: 79  Temp: (!) 97.3 F (36.3 C)  SpO2: 96%  Weight: 235 lb (106.6 kg)  Height: 5\' 3"  (1.6 m)   Body mass index is 41.63 kg/m.   Physical Exam Vitals and nursing note reviewed.  Constitutional:      Appearance: She is well-developed.  HENT:     Head: Normocephalic and atraumatic.     Mouth/Throat:     Pharynx: No oropharyngeal exudate.  Eyes:     General: No scleral icterus.    Conjunctiva/sclera: Conjunctivae normal.     Pupils: Pupils are equal, round, and reactive to light.  Cardiovascular:     Rate and Rhythm: Normal rate and  regular rhythm.     Heart sounds: Normal heart sounds. No murmur heard.  No friction rub. No gallop.   Pulmonary:     Effort: Pulmonary effort is normal.     Breath sounds: Normal breath sounds. No wheezing, rhonchi or rales.  Musculoskeletal:     Cervical back: Neck supple.     Right lower leg: No edema.     Left lower leg: No edema.  Skin:    General: Skin is warm and dry.  Neurological:     Mental Status: She is alert and oriented to person, place, and time.     No results found for  this or any previous visit (from the past 24 hour(s)).  No results found.   ASSESSMENT and PLAN  1. Essential hypertension Not controlled, will increase propranolol to BID, mostly for migraine control which will also help with BP control.  - CBC  2. Mixed hyperlipidemia Checking labs today, medications will be adjusted as needed.  - Comprehensive metabolic panel - Lipid panel  3. Recurrent major depressive disorder, in partial remission ([place]) Not controlled. Increase sertraline back to 100mg .  - TSH  4. Gastroesophageal reflux disease without esophagitis Controlled. Continue current regime.   5. OSA on CPAP Uses cpap nightly.   6. Chest pain, unspecified type - Ambulatory referral to Cardiology  Other orders - pravastatin (PRAVACHOL) 20 MG tablet; Take 1 tablet (20 mg total) by mouth daily. - VENTOLIN HFA 108 (90 Base) MCG/ACT inhaler; Inhale 1-2 puffs into the lungs every 6 (six) hours as needed for wheezing or shortness of breath. - propranolol (INDERAL) 20 MG tablet; Take 1 tablet (20 mg total) by mouth 2 (two) times daily. - sertraline (ZOLOFT) 100 MG tablet; Take 1 tablet (100 mg total) by mouth daily.  PMH, PSH, meds, allergies, Fhx, Shx, reviewed with patient today.  Return in about 3 months (around 04/08/2020).    Rutherford Guys, MD Primary Care at [place], [place] [address] Ph.  [phone] Fax [phone]

## 2020-01-08 LAB — COMPREHENSIVE METABOLIC PANEL
ALT: 11 IU/L (ref 0–32)
AST: 19 IU/L (ref 0–40)
Albumin/Globulin Ratio: 2 (ref 1.2–2.2)
Albumin: 4.4 g/dL (ref 3.7–4.7)
Alkaline Phosphatase: 173 IU/L — ABNORMAL HIGH (ref 48–121)
BUN/Creatinine Ratio: 22 (ref 12–28)
BUN: 13 mg/dL (ref 8–27)
Bilirubin Total: 0.5 mg/dL (ref 0.0–1.2)
CO2: 27 mmol/L (ref 20–29)
Calcium: 9.5 mg/dL (ref 8.7–10.3)
Chloride: 100 mmol/L (ref 96–106)
Creatinine, Ser: 0.58 mg/dL (ref 0.57–1.00)
GFR calc Af Amer: 104 mL/min/{1.73_m2} (ref >59)
GFR calc non Af Amer: 90 mL/min/{1.73_m2} (ref >59)
Globulin, Total: 2.2 g/dL (ref 1.5–4.5)
Glucose: 92 mg/dL (ref 65–99)
Potassium: 4.1 mmol/L (ref 3.5–5.2)
Sodium: 141 mmol/L (ref 134–144)
Total Protein: 6.6 g/dL (ref 6.0–8.5)

## 2020-01-08 LAB — LIPID PANEL
Chol/HDL Ratio: 4.2 ratio (ref 0.0–4.4)
Cholesterol, Total: 227 mg/dL — ABNORMAL HIGH (ref 100–199)
HDL: 54 mg/dL (ref >39)
LDL Chol Calc (NIH): 144 mg/dL — ABNORMAL HIGH (ref 0–99)
Triglycerides: 161 mg/dL — ABNORMAL HIGH (ref 0–149)
VLDL Cholesterol Cal: 29 mg/dL (ref 5–40)

## 2020-01-08 LAB — CBC
Hematocrit: 43.8 % (ref 34.0–46.6)
Hemoglobin: 14.5 g/dL (ref 11.1–15.9)
MCH: 31.2 pg (ref 26.6–33.0)
MCHC: 33.1 g/dL (ref 31.5–35.7)
MCV: 94 fL (ref 79–97)
Platelets: 280 10*3/uL (ref 150–450)
RBC: 4.65 x10E6/uL (ref 3.77–5.28)
RDW: 13.2 % (ref 11.7–15.4)
WBC: 8.4 10*3/uL (ref 3.4–10.8)

## 2020-01-08 LAB — TSH: TSH: 1.03 u[IU]/mL (ref 0.450–4.500)

## 2020-01-17 ENCOUNTER — Other Ambulatory Visit: Payer: Self-pay | Admitting: Family Medicine

## 2020-01-17 MED ORDER — PRAVASTATIN SODIUM 40 MG PO TABS: 40.0000 mg | ORAL_TABLET | Freq: Every day | ORAL | 1 refills | Status: DC

## 2020-01-18 ENCOUNTER — Other Ambulatory Visit: Payer: Self-pay

## 2020-01-18 MED ORDER — GABAPENTIN 300 MG PO CAPS: 300.0000 mg | ORAL_CAPSULE | Freq: Every day | ORAL | 1 refills | Status: DC

## 2020-02-12 ENCOUNTER — Other Ambulatory Visit: Payer: Self-pay | Admitting: Family Medicine

## 2020-02-12 NOTE — Telephone Encounter (Signed)
Requested medication (s) are due for refill today: no  Requested medication (s) are on the active medication list: yes  Future visit scheduled: yes  Notes to clinic: medication filled by a different provider Review for refill   Requested Prescriptions  Pending Prescriptions Disp Refills   risperiDONE (RISPERDAL) 0.5 MG tablet  2    Sig: Take 1 tablet (0.5 mg total) by mouth at bedtime.      Not Delegated - Psychiatry:  Antipsychotics - Second Generation (Atypical) - risperidone Failed - 02/12/2020  2:04 PM      Failed - This refill cannot be delegated      Failed - Prolactin Level (serum) in normal range and within 180 days    No results found for: PROLACTIN, TOTPROLACTIN, LABPROL        Passed - ALT in normal range and within 180 days    ALT  Date Value Ref Range Status  01/07/2020 11 0 - 32 IU/L Final          Passed - AST in normal range and within 180 days    AST  Date Value Ref Range Status  01/07/2020 19 0 - 40 IU/L Final          Passed - Valid encounter within last 6 months    Recent Outpatient Visits           1 month ago Essential hypertension   [facility], Lilia Argue, MD       Future Appointments             In 2 weeks Fay Records, MD [place], [place]   In 1 month Rutherford Guys, MD [facility] at [place], [facility]              amitriptyline (ELAVIL) 50 MG tablet 30 tablet 1    Sig: Take 1 tablet (50 mg total) by mouth at bedtime.      Psychiatry:  Antidepressants - Heterocyclics (TCAs) Passed - 02/12/2020  2:04 PM      Passed - Completed PHQ-2 or PHQ-9 in the last 360 days.      Passed - Valid encounter within last 6 months    Recent Outpatient Visits           1 month ago Essential hypertension   [facility], Lilia Argue, MD       Future Appointments             In 2 weeks Fay Records, MD [place], [place]   In 1 month Rutherford Guys, MD Primary Care  at [place], [facility]

## 2020-02-12 NOTE — Telephone Encounter (Signed)
Medication Refill - Medication: amitriptyline (ELAVIL) 50 MG tablet [[id number]]   risperiDONE (RISPERDAL) 0.5 MG tablet [[id number]]    Preferred Pharmacy (with phone number or street name):  [name], [place] - [address] [place] [address]  [address] [place] [address]  Phone: [phone] Fax: [phone]     Agent: Please be advised that RX refills may take up to 3 business days. We ask that you follow-up with your pharmacy.

## 2020-02-27 DIAGNOSIS — R55 Syncope and collapse: Secondary | ICD-10-CM | POA: Diagnosis not present

## 2020-02-27 DIAGNOSIS — F430 Acute stress reaction: Secondary | ICD-10-CM | POA: Diagnosis not present

## 2020-02-27 DIAGNOSIS — F410 Panic disorder [episodic paroxysmal anxiety] without agoraphobia: Secondary | ICD-10-CM | POA: Diagnosis not present

## 2020-02-28 DIAGNOSIS — Z743 Need for continuous supervision: Secondary | ICD-10-CM | POA: Diagnosis not present

## 2020-02-28 DIAGNOSIS — R55 Syncope and collapse: Secondary | ICD-10-CM | POA: Diagnosis not present

## 2020-02-28 DIAGNOSIS — R4589 Other symptoms and signs involving emotional state: Secondary | ICD-10-CM | POA: Diagnosis not present

## 2020-02-29 ENCOUNTER — Ambulatory Visit: Payer: Medicare HMO | Admitting: Internal Medicine

## 2020-03-16 DIAGNOSIS — N3091 Cystitis, unspecified with hematuria: Secondary | ICD-10-CM | POA: Diagnosis not present

## 2020-03-16 DIAGNOSIS — N281 Cyst of kidney, acquired: Secondary | ICD-10-CM | POA: Diagnosis not present

## 2020-03-31 ENCOUNTER — Other Ambulatory Visit: Payer: Self-pay

## 2020-03-31 ENCOUNTER — Ambulatory Visit (INDEPENDENT_AMBULATORY_CARE_PROVIDER_SITE_OTHER): Payer: Medicare HMO | Admitting: Family Medicine

## 2020-03-31 ENCOUNTER — Encounter: Payer: Self-pay | Admitting: Family Medicine

## 2020-03-31 VITALS — BP 147/98 | HR 66 | Temp 97.9°F | Ht 63.0 in | Wt 221.2 lb

## 2020-03-31 DIAGNOSIS — Z87448 Personal history of other diseases of urinary system: Secondary | ICD-10-CM

## 2020-03-31 DIAGNOSIS — F4321 Adjustment disorder with depressed mood: Secondary | ICD-10-CM | POA: Diagnosis not present

## 2020-03-31 DIAGNOSIS — I10 Essential (primary) hypertension: Secondary | ICD-10-CM | POA: Diagnosis not present

## 2020-03-31 DIAGNOSIS — Z634 Disappearance and death of family member: Secondary | ICD-10-CM | POA: Diagnosis not present

## 2020-03-31 DIAGNOSIS — Z87898 Personal history of other specified conditions: Secondary | ICD-10-CM

## 2020-03-31 LAB — POCT URINALYSIS DIP (MANUAL ENTRY)
Blood, UA: NEGATIVE
Glucose, UA: NEGATIVE mg/dL
Nitrite, UA: NEGATIVE
Protein Ur, POC: 30 mg/dL — AB
Spec Grav, UA: 1.02 (ref 1.010–1.025)
Urobilinogen, UA: 1 E.U./dL
pH, UA: 6 (ref 5.0–8.0)

## 2020-03-31 MED ORDER — SERTRALINE HCL 100 MG PO TABS: 100.0000 mg | ORAL_TABLET | Freq: Every day | ORAL | 1 refills | Status: DC

## 2020-03-31 MED ORDER — LISINOPRIL 40 MG PO TABS: 40.0000 mg | ORAL_TABLET | Freq: Every day | ORAL | 1 refills | Status: DC

## 2020-03-31 MED ORDER — AMITRIPTYLINE HCL 50 MG PO TABS: 50.0000 mg | ORAL_TABLET | Freq: Every day | ORAL | 1 refills | Status: DC

## 2020-03-31 MED ORDER — RISPERIDONE 0.5 MG PO TABS: 0.5000 mg | ORAL_TABLET | Freq: Every day | ORAL | 1 refills | Status: DC

## 2020-03-31 NOTE — Patient Instructions (Addendum)
  [employer]ia para [employer], [phone]  [employer], [phone]    If you have lab work done today you will be contacted with your lab results within the next 2 weeks.  If you have not heard from [place] then please contact us. The fastest way to get your results is to register for My Chart.   IF you received an x-ray today, you will receive an invoice from [facility]. Please contact [facility] at [phone] with questions or concerns regarding your invoice.   IF you received labwork today, you will receive an invoice from [facility]. Please contact [facility] at [phone] with questions or concerns regarding your invoice.   Our billing staff will not be able to assist you with questions regarding bills from these companies.  You will be contacted with the lab results as soon as they are available. The fastest way to get your results is to activate your My Chart account. Instructions are located on the last page of this paperwork. If you have not heard from [place] regarding the results in 2 weeks, please contact this office.
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
## 2020-04-03 DIAGNOSIS — Y998 Other external cause status: Secondary | ICD-10-CM | POA: Diagnosis not present

## 2020-04-03 DIAGNOSIS — S8001XA Contusion of right knee, initial encounter: Secondary | ICD-10-CM | POA: Diagnosis not present

## 2020-04-03 DIAGNOSIS — M79605 Pain in left leg: Secondary | ICD-10-CM | POA: Diagnosis not present

## 2020-04-03 DIAGNOSIS — S7002XA Contusion of left hip, initial encounter: Secondary | ICD-10-CM | POA: Diagnosis not present

## 2020-04-03 DIAGNOSIS — M48061 Spinal stenosis, lumbar region without neurogenic claudication: Secondary | ICD-10-CM | POA: Diagnosis not present

## 2020-04-03 DIAGNOSIS — R0781 Pleurodynia: Secondary | ICD-10-CM | POA: Diagnosis not present

## 2020-04-03 DIAGNOSIS — M25512 Pain in left shoulder: Secondary | ICD-10-CM | POA: Diagnosis not present

## 2020-04-03 DIAGNOSIS — R1032 Left lower quadrant pain: Secondary | ICD-10-CM | POA: Diagnosis not present

## 2020-04-03 DIAGNOSIS — M25562 Pain in left knee: Secondary | ICD-10-CM | POA: Diagnosis not present

## 2020-04-03 DIAGNOSIS — Z041 Encounter for examination and observation following transport accident: Secondary | ICD-10-CM | POA: Diagnosis not present

## 2020-04-03 DIAGNOSIS — I10 Essential (primary) hypertension: Secondary | ICD-10-CM | POA: Diagnosis not present

## 2020-04-03 DIAGNOSIS — N281 Cyst of kidney, acquired: Secondary | ICD-10-CM | POA: Diagnosis not present

## 2020-04-03 DIAGNOSIS — S8002XA Contusion of left knee, initial encounter: Secondary | ICD-10-CM | POA: Diagnosis not present

## 2020-04-03 DIAGNOSIS — S40012A Contusion of left shoulder, initial encounter: Secondary | ICD-10-CM | POA: Diagnosis not present

## 2020-04-03 DIAGNOSIS — R1084 Generalized abdominal pain: Secondary | ICD-10-CM | POA: Diagnosis not present

## 2020-04-03 DIAGNOSIS — M79602 Pain in left arm: Secondary | ICD-10-CM | POA: Diagnosis not present

## 2020-04-03 DIAGNOSIS — M25561 Pain in right knee: Secondary | ICD-10-CM | POA: Diagnosis not present

## 2020-04-03 DIAGNOSIS — D1803 Hemangioma of intra-abdominal structures: Secondary | ICD-10-CM | POA: Diagnosis not present

## 2020-04-03 DIAGNOSIS — S199XXA Unspecified injury of neck, initial encounter: Secondary | ICD-10-CM | POA: Diagnosis not present

## 2020-04-03 DIAGNOSIS — Y92410 Unspecified street and highway as the place of occurrence of the external cause: Secondary | ICD-10-CM | POA: Diagnosis not present

## 2020-04-03 DIAGNOSIS — M25552 Pain in left hip: Secondary | ICD-10-CM | POA: Diagnosis not present

## 2020-04-08 ENCOUNTER — Ambulatory Visit: Payer: Medicare HMO | Admitting: Family Medicine

## 2020-04-09 DIAGNOSIS — R55 Syncope and collapse: Secondary | ICD-10-CM | POA: Diagnosis not present

## 2020-04-24 ENCOUNTER — Emergency Department (HOSPITAL_COMMUNITY): Payer: Medicare HMO

## 2020-04-24 ENCOUNTER — Emergency Department (HOSPITAL_COMMUNITY)
Admission: EM | Admit: 2020-04-24 | Discharge: 2020-04-25 | Disposition: A | Discharge status: Home / Self Care | Payer: Medicare HMO | Attending: Emergency Medicine | Admitting: Emergency Medicine

## 2020-04-24 ENCOUNTER — Encounter (HOSPITAL_COMMUNITY): Payer: Self-pay

## 2020-04-24 ENCOUNTER — Other Ambulatory Visit: Payer: Self-pay

## 2020-04-24 DIAGNOSIS — Z79899 Other long term (current) drug therapy: Secondary | ICD-10-CM | POA: Diagnosis not present

## 2020-04-24 DIAGNOSIS — G44319 Acute post-traumatic headache, not intractable: Secondary | ICD-10-CM | POA: Insufficient documentation

## 2020-04-24 DIAGNOSIS — R519 Headache, unspecified: Secondary | ICD-10-CM | POA: Diagnosis present

## 2020-04-24 DIAGNOSIS — I10 Essential (primary) hypertension: Secondary | ICD-10-CM | POA: Insufficient documentation

## 2020-04-24 DIAGNOSIS — G44309 Post-traumatic headache, unspecified, not intractable: Secondary | ICD-10-CM | POA: Diagnosis not present

## 2020-04-24 DIAGNOSIS — R42 Dizziness and giddiness: Secondary | ICD-10-CM | POA: Diagnosis not present

## 2020-04-24 MED ORDER — SODIUM CHLORIDE 0.9 % IV BOLUS
500.0000 mL | Freq: Once | INTRAVENOUS | Status: AC
  Administered 2020-04-25: 500 mL via INTRAVENOUS

## 2020-04-24 MED ORDER — METOCLOPRAMIDE HCL 5 MG/ML IJ SOLN
10.0000 mg | Freq: Once | INTRAMUSCULAR | Status: AC
  Administered 2020-04-25: 10 mg via INTRAVENOUS
  Filled 2020-04-24: qty 2

## 2020-04-24 MED ORDER — METHOCARBAMOL 500 MG PO TABS
500.0000 mg | ORAL_TABLET | Freq: Once | ORAL | Status: AC
  Administered 2020-04-25: 500 mg via ORAL
  Filled 2020-04-24: qty 1

## 2020-04-24 MED ORDER — DIPHENHYDRAMINE HCL 50 MG/ML IJ SOLN
25.0000 mg | Freq: Once | INTRAMUSCULAR | Status: AC
  Administered 2020-04-25: 25 mg via INTRAVENOUS
  Filled 2020-04-24: qty 1

## 2020-04-24 MED ORDER — MECLIZINE HCL 12.5 MG PO TABS
25.0000 mg | ORAL_TABLET | Freq: Once | ORAL | Status: AC
  Administered 2020-04-25: 25 mg via ORAL
  Filled 2020-04-24: qty 2
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
## 2020-04-24 NOTE — ED Triage Notes (Signed)
Patient states that she has severe headache and that she is hypertensive with a blood pressure of 185/111. Current BP is 117/65. Patient did take Lisinopril at home. Patient states that HTN and headache started today.

## 2020-04-25 DIAGNOSIS — R42 Dizziness and giddiness: Secondary | ICD-10-CM | POA: Diagnosis not present

## 2020-04-25 MED ORDER — ONDANSETRON HCL 4 MG PO TABS
4.0000 mg | ORAL_TABLET | Freq: Three times a day (TID) | ORAL | 0 refills | Status: DC | PRN
Start: 2020-04-25 — End: 2020-08-29

## 2020-04-25 MED ORDER — MECLIZINE HCL 25 MG PO TABS
25.0000 mg | ORAL_TABLET | Freq: Three times a day (TID) | ORAL | 0 refills | Status: DC | PRN
Start: 2020-04-25 — End: 2020-05-30
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
## 2020-05-30 ENCOUNTER — Encounter: Payer: Self-pay | Admitting: Family Medicine

## 2020-05-30 ENCOUNTER — Other Ambulatory Visit: Payer: Self-pay

## 2020-05-30 ENCOUNTER — Ambulatory Visit (INDEPENDENT_AMBULATORY_CARE_PROVIDER_SITE_OTHER): Payer: Medicare HMO | Admitting: Family Medicine

## 2020-05-30 VITALS — BP 139/82 | HR 75 | Temp 98.5°F | Ht 63.0 in | Wt 227.0 lb

## 2020-05-30 DIAGNOSIS — G44329 Chronic post-traumatic headache, not intractable: Secondary | ICD-10-CM

## 2020-05-30 DIAGNOSIS — R42 Dizziness and giddiness: Secondary | ICD-10-CM | POA: Diagnosis not present

## 2020-05-30 DIAGNOSIS — K219 Gastro-esophageal reflux disease without esophagitis: Secondary | ICD-10-CM | POA: Diagnosis not present

## 2020-05-30 DIAGNOSIS — E559 Vitamin D deficiency, unspecified: Secondary | ICD-10-CM | POA: Diagnosis not present

## 2020-05-30 DIAGNOSIS — H532 Diplopia: Secondary | ICD-10-CM

## 2020-05-30 DIAGNOSIS — Z23 Encounter for immunization: Secondary | ICD-10-CM

## 2020-05-30 MED ORDER — OPTIMAL-D 1.25 MG (50000 UT) PO CAPS: 50000.0000 [IU] | ORAL_CAPSULE | ORAL | 3 refills | Status: DC

## 2020-05-30 MED ORDER — PANTOPRAZOLE SODIUM 40 MG PO TBEC: 40.0000 mg | DELAYED_RELEASE_TABLET | Freq: Every day | ORAL | 3 refills | Status: DC

## 2020-05-30 MED ORDER — MECLIZINE HCL 25 MG PO TABS: 25.0000 mg | ORAL_TABLET | Freq: Three times a day (TID) | ORAL | 3 refills | Status: DC | PRN
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
## 2020-05-30 NOTE — Patient Instructions (Signed)
Sndrome posconmocional Post-Concussion Syndrome  Una conmocin cerebral es una lesin en el cerebro a causa de un impacto (golpe) directo en la cabeza o el cuerpo. Este golpe hace que el cerebro se sacuda rpidamente hacia atrs y [place] adelante dentro del crneo. Esto puede daar las clulas cerebrales y causar cambios qumicos en el cerebro. Generalmente no resulta potencialmente mortal, pero puede causar varios sntomas graves. El sndrome posconmocional se produce cuando los sntomas que ocurren despus de una conmocin cerebral duran ms de lo normal. Estos sntomas pueden durar desde varias semanas hasta meses. Cules son las causas? Se desconoce la causa de esta afeccin. Puede ocurrir tanto si la lesin en la cabeza fue leve o grave. Qu incrementa el riesgo? Es ms probable que contraiga esta afeccin si:  Es mujer.  Es un nio, un adolescente o un adulto joven.  Sufri una lesin en la cabeza en el pasado.  Tienen antecedentes de dolores de [place].  Tiene depresin o ansiedad. Cules son los signos o los sntomas? Sntomas fsicos  Dolores de [place].  Cansancio.  Mareos.  Debilidad.  Visin borrosa.  Sensibilidad a Naval architect.  Dificultad para or. Sntomas mentales y emocionales  Dificultades de memoria.  Dificultad para concentrarse.  Dificultad para dormirse o para continuar durmiendo.  Sentirse irritable.  Sentir ansiedad o depresin.  Dificultad para aprender cosas nuevas. Cmo se diagnostica? Esta afeccin se puede diagnosticar en funcin de lo siguiente:  Sus sntomas.  Descripcin de la lesin.  Sus antecedentes mdicos. El mdico puede ordenar otras pruebas como las siguientes:  Pruebas de la funcin cerebral (pruebas neurolgicas).  Una exploracin por tomografa computarizada (TC). Cmo se trata? El tratamiento de esta afeccin puede depender de los sntomas. Por lo general, los sntomas desaparecen por s mismos con Physiological scientist. Los  tratamientos pueden incluir lo siguiente:  Medicamentos para los dolores de [place].  Descanso del cerebro y el cuerpo durante algunos das despus de la lesin.  Terapia de rehabilitacin, por ejemplo: ? Fisioterapia o terapia ocupacional. Esto puede incluir ejercicios para ayudar con el equilibrio y [place]. ? Asesoramiento en salud mental. ? Terapia del habla. ? Terapia de la visin. Un especialista en cerebro y ojos puede recomendarle tratamientos para los problemas de visin. Siga estas indicaciones en su casa: Medicamentos  [employer] de venta libre y los recetados solamente como se lo haya indicado el mdico.  Evite analgsicos opioides recetados mientras se est recuperando de una conmocin cerebral. Actividad  Limite las actividades mentales durante los primeros das despus de la lesin, por ejemplo: ? Tareas para el hogar o trabajos relacionados con el empleo. ? Pensamientos complejos. ? Mirar televisin y usar una computadora o el telfono. ? Jugar juegos de memoria y armar rompecabezas. ? Retome gradualmente su nivel de actividad normal. Si una determinada actividad le provoca sntomas, detngase o aminore el ritmo hasta que pueda realizar la actividad sin que desencadenen sus sntomas.  Limite la actividad fsica, como hacer ejercicios o Careers information officer, durante los primeros das despus de una conmocin cerebral. Retome gradualmente la actividad normal, segn lo indicado por su mdico. ? Si una determinada actividad le provoca sntomas, detngase o aminore el ritmo hasta que pueda realizar la actividad sin que desencadenen sus sntomas.  Reposo. El reposo favorece la curacin del cerebro. Asegrese de hacer lo siguiente: ? Duerma bien por la noche. La mayora de los adultos deben lograr al menos 7 a 9horas de sueo todas las noches. ? [place].  Tome siestas o haga pausas cuando se sienta cansado.  No realice actividades de alto riesgo que  pudieran provocar una segunda conmocin cerebral, como andar en bicicleta o practicar deportes. Puede ser peligroso sufrir otra conmocin cerebral antes de que se haya recuperado de la primera. Indicaciones generales  No beba alcohol hasta que el mdico se lo autorice.  Lleve un registro de la frecuencia y la gravedad de los sntomas. Entregue esta informacin a su mdico.  Concurra a todas las visitas de seguimiento como se lo haya indicado el mdico. Esto es importante. Comunquese con un mdico si:  Sus sntomas no mejoran.  Sufre una nueva lesin. Solicite ayuda inmediatamente si:  Tiene dolores de cabeza intensos o que empeoran.  Se siente confundido.  Tiene dificultad para mantenerse despierto.  Se desmaya.  Vomita.  Siente debilidad o adormecimiento en alguna parte del cuerpo.  Convulsiones.  Tiene dificultad para hablar. Resumen  El sndrome posconmocional se produce cuando los sntomas que ocurren despus de una conmocin cerebral duran ms de lo normal.  Por lo general, los sntomas desaparecen por s mismos con el tiempo. Segn los sntomas, es posible que necesite tratamiento, como medicamentos o terapia de rehabilitacin.  Descanse el cerebro y el cuerpo durante algunos das despus de la lesin. Retome gradualmente sus actividades segn lo indicado por su mdico.  Duerma lo suficiente, y evite el consumo de alcohol y analgsicos opioides mientras se recupera de una conmocin cerebral. Esta informacin no tiene Marine scientist el consejo del mdico. Asegrese de hacerle al mdico cualquier pregunta que tenga. Document Revised: 06/09/2018 Document Reviewed: 06/09/2018 Elsevier Patient Education  2020 [employer].

## 2020-06-08 ENCOUNTER — Ambulatory Visit: Payer: Medicare HMO | Admitting: Cardiology

## 2020-06-08 ENCOUNTER — Emergency Department (HOSPITAL_COMMUNITY)
Admission: EM | Admit: 2020-06-08 | Discharge: 2020-06-08 | Disposition: A | Discharge status: Home / Self Care | Payer: Medicare HMO | Attending: Emergency Medicine | Admitting: Emergency Medicine

## 2020-06-08 ENCOUNTER — Other Ambulatory Visit: Payer: Self-pay

## 2020-06-08 ENCOUNTER — Other Ambulatory Visit (HOSPITAL_COMMUNITY)
Admission: RE | Admit: 2020-06-08 | Discharge: 2020-06-08 | Disposition: A | Discharge status: Home / Self Care | Payer: Medicare HMO | Source: Ambulatory Visit | Attending: Cardiology | Admitting: Cardiology

## 2020-06-08 ENCOUNTER — Ambulatory Visit (INDEPENDENT_AMBULATORY_CARE_PROVIDER_SITE_OTHER): Payer: Medicare HMO | Admitting: Cardiology

## 2020-06-08 ENCOUNTER — Encounter: Payer: Self-pay | Admitting: Cardiology

## 2020-06-08 ENCOUNTER — Encounter (HOSPITAL_COMMUNITY): Payer: Self-pay | Admitting: Emergency Medicine

## 2020-06-08 ENCOUNTER — Emergency Department (HOSPITAL_COMMUNITY): Payer: Medicare HMO

## 2020-06-08 VITALS — BP 122/84 | HR 76 | Ht 63.0 in | Wt 228.0 lb

## 2020-06-08 DIAGNOSIS — I10 Essential (primary) hypertension: Secondary | ICD-10-CM | POA: Diagnosis not present

## 2020-06-08 DIAGNOSIS — R059 Cough, unspecified: Secondary | ICD-10-CM | POA: Diagnosis not present

## 2020-06-08 DIAGNOSIS — I2699 Other pulmonary embolism without acute cor pulmonale: Secondary | ICD-10-CM | POA: Diagnosis not present

## 2020-06-08 DIAGNOSIS — Z79899 Other long term (current) drug therapy: Secondary | ICD-10-CM | POA: Diagnosis not present

## 2020-06-08 DIAGNOSIS — R7989 Other specified abnormal findings of blood chemistry: Secondary | ICD-10-CM | POA: Diagnosis not present

## 2020-06-08 DIAGNOSIS — Z20822 Contact with and (suspected) exposure to covid-19: Secondary | ICD-10-CM | POA: Insufficient documentation

## 2020-06-08 DIAGNOSIS — R0602 Shortness of breath: Secondary | ICD-10-CM | POA: Diagnosis not present

## 2020-06-08 DIAGNOSIS — R079 Chest pain, unspecified: Secondary | ICD-10-CM | POA: Insufficient documentation

## 2020-06-08 DIAGNOSIS — R799 Abnormal finding of blood chemistry, unspecified: Secondary | ICD-10-CM | POA: Insufficient documentation

## 2020-06-08 DIAGNOSIS — E782 Mixed hyperlipidemia: Secondary | ICD-10-CM

## 2020-06-08 DIAGNOSIS — Z7901 Long term (current) use of anticoagulants: Secondary | ICD-10-CM | POA: Insufficient documentation

## 2020-06-08 DIAGNOSIS — R0600 Dyspnea, unspecified: Secondary | ICD-10-CM

## 2020-06-08 DIAGNOSIS — Z86711 Personal history of pulmonary embolism: Secondary | ICD-10-CM | POA: Diagnosis not present

## 2020-06-08 DIAGNOSIS — R0609 Other forms of dyspnea: Secondary | ICD-10-CM

## 2020-06-08 DIAGNOSIS — Z86718 Personal history of other venous thrombosis and embolism: Secondary | ICD-10-CM | POA: Insufficient documentation

## 2020-06-08 LAB — CBC
HCT: 42.9 % (ref 36.0–46.0)
Hemoglobin: 13.8 g/dL (ref 12.0–15.0)
MCH: 31.7 pg (ref 26.0–34.0)
MCHC: 32.2 g/dL (ref 30.0–36.0)
MCV: 98.4 fL (ref 80.0–100.0)
Platelets: 263 10*3/uL (ref 150–400)
RBC: 4.36 MIL/uL (ref 3.87–5.11)
RDW: 13.8 % (ref 11.5–15.5)
WBC: 8.2 10*3/uL (ref 4.0–10.5)
nRBC: 0 % (ref 0.0–0.2)

## 2020-06-08 LAB — BASIC METABOLIC PANEL
Anion gap: 7 (ref 5–15)
BUN: 22 mg/dL (ref 8–23)
CO2: 31 mmol/L (ref 22–32)
Calcium: 8.9 mg/dL (ref 8.9–10.3)
Chloride: 101 mmol/L (ref 98–111)
Creatinine, Ser: 0.63 mg/dL (ref 0.44–1.00)
GFR, Estimated: >60 mL/min (ref >60)
Glucose, Bld: 90 mg/dL (ref 70–99)
Potassium: 3.9 mmol/L (ref 3.5–5.1)
Sodium: 139 mmol/L (ref 135–145)

## 2020-06-08 LAB — RESPIRATORY PANEL BY RT PCR (FLU A&B, COVID)
Influenza A by PCR: NEGATIVE
Influenza B by PCR: NEGATIVE
SARS Coronavirus 2 by RT PCR: NEGATIVE

## 2020-06-08 LAB — D-DIMER, QUANTITATIVE: D-Dimer, Quant: 1.05 ug/mL-FEU — ABNORMAL HIGH (ref 0.00–0.50)

## 2020-06-08 LAB — URINALYSIS, ROUTINE W REFLEX MICROSCOPIC
Bacteria, UA: NONE SEEN
Bilirubin Urine: NEGATIVE
Glucose, UA: NEGATIVE mg/dL
Ketones, ur: NEGATIVE mg/dL
Nitrite: NEGATIVE
Protein, ur: NEGATIVE mg/dL
Specific Gravity, Urine: 1.031 — ABNORMAL HIGH (ref 1.005–1.030)
pH: 7 (ref 5.0–8.0)

## 2020-06-08 LAB — TROPONIN I (HIGH SENSITIVITY): Troponin I (High Sensitivity): <2 ng/L (ref <18)

## 2020-06-08 MED ORDER — ACETAMINOPHEN 500 MG PO TABS
1000.0000 mg | ORAL_TABLET | Freq: Once | ORAL | Status: AC
  Administered 2020-06-08: 1000 mg via ORAL
  Filled 2020-06-08: qty 2

## 2020-06-08 MED ORDER — ALUM & MAG HYDROXIDE-SIMETH 200-200-20 MG/5ML PO SUSP
30.0000 mL | Freq: Once | ORAL | Status: AC
  Administered 2020-06-08: 30 mL via ORAL
  Filled 2020-06-08: qty 30

## 2020-06-08 MED ORDER — IOHEXOL 350 MG/ML SOLN
100.0000 mL | Freq: Once | INTRAVENOUS | Status: AC | PRN
  Administered 2020-06-08: 100 mL via INTRAVENOUS

## 2020-06-08 MED ORDER — FAMOTIDINE 20 MG PO TABS
20.0000 mg | ORAL_TABLET | Freq: Once | ORAL | Status: AC
  Administered 2020-06-08: 20 mg via ORAL
  Filled 2020-06-08: qty 1

## 2020-06-08 NOTE — Assessment & Plan Note (Signed)
Controlled.  

## 2020-06-08 NOTE — Discharge Instructions (Addendum)
It was our pleasure to provide your ER care today - we hope that you feel better.  Your CT scan shows no pulmonary embolism  Follow up with your doctor/cardiologist in the next 1-2 weeks.  Return to ER if worse, new symptoms, fevers, recurrent or persistent chest pain, increased trouble breathing, or other concern.

## 2020-06-08 NOTE — Assessment & Plan Note (Signed)
Per PCP 

## 2020-06-08 NOTE — Assessment & Plan Note (Signed)
Rt LE DVT June 2019 treated with 3 months of Xarelto.  F/U dopplers negative Oct 2019. Chest CTA negative for PE June 2019

## 2020-06-08 NOTE — ED Provider Notes (Addendum)
[facility] EMERGENCY DEPARTMENT Provider Note   CSN: [id number] Arrival date & time: 06/08/20  03-Nov-1540     History Chief Complaint  Patient presents with  . Abnormal Lab    Kendra [name] is a 74 y.o. female.  Patient c/o mid chest pain and sob/mild doe in past couple weeks. Symptoms episodic, recurrent, last 1-2 minutes, occur at rest and with activity. Denies acute or abrupt change or worsening today. No associated nv, or diaphoresis. No palpitations. No constant and/or pleuritic chest pain. No leg pain or swelling. No fever or chills. Occasional non prod cough. Was seen by cardiology today for same, had trop normal, but ddimer elevated so they sent to ED.   The history is provided by the patient. A language interpreter was used Programmer, applications).  Abnormal Lab      Past Medical History:  Diagnosis Date  . Arthritis   . Chronic back pain   . Conversion disorder    caused by stress of daughter's death in 2009-11-03  . Depression   . GERD (gastroesophageal reflux disease)   . Hypercholesterolemia   . Hypertension   . Kidney stone    stones  . Migraine   . Panic attacks    daily, worse the 15th of each month  . Thyroid disease     Patient Active Problem List   Diagnosis Date Noted  . History of DVT (deep vein thrombosis) 12/26/2017  . Acute pulmonary embolism without acute cor pulmonale (HCC)   . Chest pain 12/25/2017  . Essential hypertension 12/25/2017  . Mixed hyperlipidemia 12/25/2017  . Chest pain of uncertain etiology   . ABSCESS, TOOTH 09/06/2010  . Morbid obesity ([place]) 07/12/2010  . CHOLECYSTECTOMY, HX OF 04/25/2010  . Depression 03/17/2010  . HYPERTENSION, BENIGN ESSENTIAL 10/28/2009  . ALLERGIC RHINITIS 10/12/2009  . OSA on CPAP 10/12/2009  . HYPERPARATHYROIDISM UNSPECIFIED 07/19/2009  . KNEE PAIN, BILATERAL 01/11/2009  . LABYRINTHITIS, ACUTE 09/29/2008  . BACK PAIN, LUMBAR 08/03/2008  . CALCANEAL SPUR, RIGHT 07/14/2008  . OSTEOPENIA 07/14/2008  .  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 GASTROENTERITIS, ACUTE 01/08/2008  . HEMATURIA UNSPECIFIED 01/08/2008  . TEMPOROMANDIBULAR JOINT PAIN 07/09/2007  . Dyslipidemia 03/07/2007  . GERD 03/07/2007  . OSTEOARTHROSIS, LOCAL, PRIMARY, LOWER LEG 03/07/2007  . MIGRAINE HEADACHE 03/03/2007  . CERVICAL MUSCLE STRAIN 03/03/2007  . HELICOBACTER PYLORI INFECTION, HX OF 12/21/2005    Past Surgical History:  Procedure Laterality Date  . ABDOMINAL HYSTERECTOMY     r ovary removal  . APPENDECTOMY    . CHOLECYSTECTOMY    . CYSTOSCOPY    . LEFT HEART CATH AND CORONARY ANGIOGRAPHY N/A 12/26/2017   Procedure: LEFT HEART CATH AND CORONARY ANGIOGRAPHY;  Surgeon: Lorretta Harp, MD;  Location: [facility];  Service: Cardiovascular;  Laterality: N/A;  . THYROID SURGERY       OB History   No obstetric history on file.     Family History  Problem Relation Age of Onset  . Arthritis Mother   . Migraines Mother   . Cancer Sister   . Cancer Maternal Grandmother     Social History   Tobacco Use  . Smoking status: Never Smoker  . Smokeless tobacco: Never Used  Vaping Use  . Vaping Use: Never used  Substance Use Topics  . Alcohol use: No  . Drug use: No    Home Medications Prior to Admission medications   Medication Sig Start Date End Date Taking? Authorizing Provider  amitriptyline (ELAVIL) 50 MG tablet  Take 1 tablet (50 mg total) by mouth at bedtime. 03/31/20   Rutherford Guys, MD  fluorometholone (FML) 0.1 % ophthalmic suspension Place 1 drop into the left eye daily. 11/27/17   [provider]  gabapentin (NEURONTIN) 300 MG capsule Take 1 capsule (300 mg total) by mouth daily. 01/18/20   Rutherford Guys, MD  lisinopril (ZESTRIL) 40 MG tablet Take 1 tablet (40 mg total) by mouth daily. 03/31/20   Rutherford Guys, MD  meclizine (ANTIVERT) 25 MG tablet Take 1 tablet (25 mg total) by mouth 3 (three) times daily as needed for dizziness. 05/30/20   Just, Laurita Quint, FNP  nitroGLYCERIN  (NITROSTAT) 0.4 MG SL tablet Place 1 tablet (0.4 mg total) under the tongue every 5 (five) minutes x 3 doses as needed for chest pain. 12/27/17   Rai, Ripudeep K, MD  ondansetron (ZOFRAN) 4 MG tablet Take 1 tablet (4 mg total) by mouth every 8 (eight) hours as needed for nausea or vomiting. 04/25/20   Rolland Porter, MD  OPTIMAL-D 1.25 MG (50000 UT) capsule Take 1 capsule (50,000 Units total) by mouth once a week. 05/30/20   Just, Laurita Quint, FNP  pantoprazole (PROTONIX) 40 MG tablet Take 1 tablet (40 mg total) by mouth daily. 05/30/20   Just, Laurita Quint, FNP  pravastatin (PRAVACHOL) 40 MG tablet Take 1 tablet (40 mg total) by mouth daily. 01/17/20   Rutherford Guys, MD  propranolol (INDERAL) 20 MG tablet Take 1 tablet (20 mg total) by mouth 2 (two) times daily. 01/07/20   Rutherford Guys, MD  risperiDONE (RISPERDAL) 0.5 MG tablet Take 1 tablet (0.5 mg total) by mouth at bedtime. 03/31/20   Rutherford Guys, MD  solifenacin (VESICARE) 10 MG tablet Take 10 mg by mouth daily. 09/20/19   [provider]  rivaroxaban (XARELTO) 20 MG TABS tablet Take 1 tablet (20 mg total) by mouth daily with supper. Please fill this prescription after the first starter pack has been completed. 01/16/18 01/03/19  Mendel Corning, MD    Allergies    Hydrocodone, Morphine, and Pineapple  Review of Systems   Review of Systems  Constitutional: Negative for fever.  HENT: Negative for sore throat.   Eyes: Negative for redness.  Respiratory: Positive for cough.   Cardiovascular: Positive for chest pain. Negative for palpitations and leg swelling.  Gastrointestinal: Negative for abdominal pain and vomiting.  Genitourinary: Negative for flank pain.  Musculoskeletal: Negative for back pain and neck pain.  Skin: Negative for rash.  Neurological: Negative for syncope and headaches.  Hematological: Does not bruise/bleed easily.  Psychiatric/Behavioral: Negative for confusion.    Physical Exam Updated Vital Signs BP 138/88 (BP  Location: Right Arm)   Pulse 73   Temp 98.8 F (37.1 C) (Oral)   Resp 16   Ht 1.6 m (5\' 3" )   Wt 103.4 kg   SpO2 97%   BMI 40.39 kg/m   Physical Exam Vitals and nursing note reviewed.  Constitutional:      Appearance: Normal appearance. She is well-developed.  HENT:     Head: Atraumatic.     Nose: Nose normal.     Mouth/Throat:     Mouth: Mucous membranes are moist.  Eyes:     General: No scleral icterus.    Conjunctiva/sclera: Conjunctivae normal.  Neck:     Trachea: No tracheal deviation.  Cardiovascular:     Rate and Rhythm: Normal rate and regular rhythm.     Pulses: Normal pulses.  Heart sounds: Normal heart sounds. No murmur heard.  No friction rub. No gallop.   Pulmonary:     Effort: Pulmonary effort is normal. No respiratory distress.     Breath sounds: Normal breath sounds.  Chest:     Chest wall: No tenderness.  Abdominal:     General: Bowel sounds are normal. There is no distension.     Palpations: Abdomen is soft.     Tenderness: There is no abdominal tenderness. There is no guarding.  Genitourinary:    Comments: No cva tenderness.  Musculoskeletal:        General: No swelling or tenderness.     Cervical back: Normal range of motion and neck supple. No rigidity. No muscular tenderness.  Skin:    General: Skin is warm and dry.     Findings: No rash.  Neurological:     Mental Status: She is alert.     Comments: Alert, speech normal.   Psychiatric:        Mood and Affect: Mood normal.     ED Results / Procedures / Treatments   Labs (all labs ordered are listed, but only abnormal results are displayed) Results for orders placed or performed during the hospital encounter of 06/08/20  Respiratory Panel by RT PCR (Flu A&B, Covid) - Nasopharyngeal Swab   Specimen: Nasopharyngeal Swab  Result Value Ref Range   SARS Coronavirus 2 by RT PCR NEGATIVE NEGATIVE   Influenza A by PCR NEGATIVE NEGATIVE   Influenza B by PCR NEGATIVE NEGATIVE  CBC  Result  Value Ref Range   WBC 8.2 4.0 - 10.5 K/uL   RBC 4.36 3.87 - 5.11 MIL/uL   Hemoglobin 13.8 12.0 - 15.0 g/dL   HCT 42.9 36 - 46 %   MCV 98.4 80.0 - 100.0 fL   MCH 31.7 26.0 - 34.0 pg   MCHC 32.2 30.0 - 36.0 g/dL   RDW 13.8 11.5 - 15.5 %   Platelets 263 150 - 400 K/uL   nRBC 0.0 0.0 - 0.2 %  Basic metabolic panel  Result Value Ref Range   Sodium 139 135 - 145 mmol/L   Potassium 3.9 3.5 - 5.1 mmol/L   Chloride 101 98 - 111 mmol/L   CO2 31 22 - 32 mmol/L   Glucose, Bld 90 70 - 99 mg/dL   BUN 22 8 - 23 mg/dL   Creatinine, Ser 0.63 0.44 - 1.00 mg/dL   Calcium 8.9 8.9 - 10.3 mg/dL   GFR, Estimated >60 >60 mL/min   Anion gap 7 5 - 15  Urinalysis, Routine w reflex microscopic Urine, Clean Catch  Result Value Ref Range   Color, Urine STRAW (A) YELLOW   APPearance CLEAR CLEAR   Specific Gravity, Urine 1.031 (H) 1.005 - 1.030   pH 7.0 5.0 - 8.0   Glucose, UA NEGATIVE NEGATIVE mg/dL   Hgb urine dipstick SMALL (A) NEGATIVE   Bilirubin Urine NEGATIVE NEGATIVE   Ketones, ur NEGATIVE NEGATIVE mg/dL   Protein, ur NEGATIVE NEGATIVE mg/dL   Nitrite NEGATIVE NEGATIVE   Leukocytes,Ua TRACE (A) NEGATIVE   RBC / HPF 0-5 0 - 5 RBC/hpf   WBC, UA 0-5 0 - 5 WBC/hpf   Bacteria, UA NONE SEEN NONE SEEN   Squamous Epithelial / LPF 0-5 0 - 5   Mucus PRESENT     EKG EKG Interpretation  Date/Time:  Wednesday June 08 2020 16:29:29 EST Ventricular Rate:  71 PR Interval:  164 QRS Duration: 92 QT Interval:  388 QTC Calculation: 421 R Axis:   41 Text Interpretation: Normal sinus rhythm Non-specific ST-t changes Confirmed by Lajean Saver [phone]) on 06/08/2020 4:44:26 PM   Radiology CT Angio Chest PE W/Cm &/Or Wo Cm  Result Date: 06/08/2020 CLINICAL DATA:  74 year old female with positive D-dimer. Concern for pulmonary embolism. EXAM: CT ANGIOGRAPHY CHEST WITH CONTRAST TECHNIQUE: Multidetector CT imaging of the chest was performed using the standard protocol during bolus administration of  intravenous contrast. Multiplanar CT image reconstructions and MIPs were obtained to evaluate the vascular anatomy. CONTRAST:  167mL OMNIPAQUE IOHEXOL 350 MG/ML SOLN COMPARISON:  Chest radiograph dated 11/22/2019. chest CT dated 12/25/2017. FINDINGS: Cardiovascular: There is no cardiomegaly or pericardial effusion. Three-vessel coronary vascular calcification. Mild atherosclerotic calcification of the thoracic aorta. No aneurysmal dilatation or dissection. Evaluation of the pulmonary arteries is somewhat limited due to respiratory motion artifact. No pulmonary artery embolus identified. Mediastinum/Nodes: Mildly enlarged right hilar lymph node measures 13 mm. No mediastinal adenopathy. The esophagus and the thyroid gland are grossly unremarkable. No mediastinal fluid collection. Lungs/Pleura: The lungs are clear. There is trace bilateral pleural effusions versus less likely pleural thickening. No pneumothorax. The central airways are patent. Upper Abdomen: Cholecystectomy. Slight irregularity of the liver contour may represent early changes of cirrhosis. Clinical correlation is recommended. Musculoskeletal: Degenerative changes of the spine. No acute osseous pathology. Review of the MIP images confirms the above findings. IMPRESSION: 1. No CT evidence of pulmonary embolism. 2. Trace bilateral pleural effusions versus less likely pleural thickening. 3. Aortic Atherosclerosis (ICD10-I70.0). Electronically Signed   By: Anner Crete M.D.   On: 06/08/2020 19:05    Procedures Procedures (including critical care time)  Medications Ordered in ED Medications - No data to display  ED Course  I have reviewed the triage vital signs and the nursing notes.  Pertinent labs & imaging results that were available during my care of the patient were reviewed by me and considered in my medical decision making (see chart for details).    MDM Rules/Calculators/A&P                         Iv ns. Stat labs.   Reviewed  nursing notes and prior charts for additional history.  Labs from cardiology office reviewed - after symptoms for past couple weeks, trop is normal, ddimer mildly elevated. (Unclear why ddimer ordered, as symptoms, intermittent, not pleuritic, do not necessarily sound c/w acute PE). Cardiac cath 2019 without significant cad.   Labs reviewed/interpreted by me - wbc normal, chem normal.  CT reviewed/interpreted by me - no pna or PE.   Recheck pt - resting comfortably, no distress, breathing comfortably, vitals normal, pulse ox 98%.   Pt currently appears stable for d/c.   Rec pcp f/u.       Final Clinical Impression(s) / ED Diagnoses Final diagnoses:  None    Rx / DC Orders ED Discharge Orders    None          Lajean Saver, MD 06/08/20 1942

## 2020-06-08 NOTE — Addendum Note (Signed)
Addended by: Levonne Hubert on: 06/08/2020 05:09 PM   Modules accepted: Orders

## 2020-06-08 NOTE — Assessment & Plan Note (Signed)
Doubt cardiac with history of normal coronaries and normal EKG.  Consider PE with history of DVT, obesity, and inactivity secondary to DJD.  Check troponin and D-dimer-f/u pending results

## 2020-06-08 NOTE — Patient Instructions (Signed)
Medication Instructions:  Continue all current medications.  Labwork:  D-Dimer, Troponin level - orders given today.   Office will contact with results via phone or letter.    Testing/Procedures: none  Follow-Up: Will call with results.    Any Other Special Instructions Will Be Listed Below (If Applicable).  If you need a refill on your cardiac medications before your next appointment, please call your pharmacy.

## 2020-06-08 NOTE — Assessment & Plan Note (Signed)
BMI 40 

## 2020-06-08 NOTE — ED Triage Notes (Signed)
Pt was sent by her cardiologist with an elevated d-dimer.
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
## 2020-06-08 NOTE — Progress Notes (Deleted)
Clinical Summary Kendra [name] is a 74 y.o.female  1. History of chest pain - workup in 6/20219. Echocardiogram obtained on 12/25/2017 showed EF 60 to 65%, grade 1 DD.  She ultimately underwent cardiac catheterization on 12/26/2017 which demonstrated essentially normal coronaries with only 30% LAD disease, normal filling pressure    2. HTN   3. Hyperlipidemia   4. Palpitations - 06/2018 benign event monitor  5. History of DVT  6. Hematuria   Past Medical History:  Diagnosis Date  . Arthritis   . Chronic back pain   . Conversion disorder    caused by stress of daughter's death in 10-22-09  . Depression   . GERD (gastroesophageal reflux disease)   . Hypercholesterolemia   . Hypertension   . Kidney stone    stones  . Migraine   . Panic attacks    daily, worse the 15th of each month  . Thyroid disease      Allergies  Allergen Reactions  . Hydrocodone Nausea And Vomiting  . Morphine Swelling    REACTION: swelling  . Pineapple Swelling    Throat swelling     Current Outpatient Medications  Medication Sig Dispense Refill  . amitriptyline (ELAVIL) 50 MG tablet Take 1 tablet (50 mg total) by mouth at bedtime. 90 tablet 1  . fluorometholone (FML) 0.1 % ophthalmic suspension Place 1 drop into the left eye daily.  0  . gabapentin (NEURONTIN) 300 MG capsule Take 1 capsule (300 mg total) by mouth daily. 90 capsule 1  . lisinopril (ZESTRIL) 40 MG tablet Take 1 tablet (40 mg total) by mouth daily. 90 tablet 1  . meclizine (ANTIVERT) 25 MG tablet Take 1 tablet (25 mg total) by mouth 3 (three) times daily as needed for dizziness. 30 tablet 3  . nitroGLYCERIN (NITROSTAT) 0.4 MG SL tablet Place 1 tablet (0.4 mg total) under the tongue every 5 (five) minutes x 3 doses as needed for chest pain. 30 tablet 12  . ondansetron (ZOFRAN) 4 MG tablet Take 1 tablet (4 mg total) by mouth every 8 (eight) hours as needed for nausea or vomiting. 10 tablet 0  . OPTIMAL-D 1.25 MG (50000 UT)  capsule Take 1 capsule (50,000 Units total) by mouth once a week. 12 capsule 3  . pantoprazole (PROTONIX) 40 MG tablet Take 1 tablet (40 mg total) by mouth daily. 90 tablet 3  . pravastatin (PRAVACHOL) 40 MG tablet Take 1 tablet (40 mg total) by mouth daily. 90 tablet 1  . propranolol (INDERAL) 20 MG tablet Take 1 tablet (20 mg total) by mouth 2 (two) times daily. 180 tablet 1  . risperiDONE (RISPERDAL) 0.5 MG tablet Take 1 tablet (0.5 mg total) by mouth at bedtime. 90 tablet 1  . sertraline (ZOLOFT) 100 MG tablet Take 1 tablet (100 mg total) by mouth daily. 90 tablet 1  . solifenacin (VESICARE) 10 MG tablet Take 10 mg by mouth daily.    . VENTOLIN HFA 108 (90 Base) MCG/ACT inhaler Inhale 1-2 puffs into the lungs every 6 (six) hours as needed for wheezing or shortness of breath. 18 g 0   No current facility-administered medications for this visit.     Past Surgical History:  Procedure Laterality Date  . ABDOMINAL HYSTERECTOMY     r ovary removal  . APPENDECTOMY    . CHOLECYSTECTOMY    . CYSTOSCOPY    . LEFT HEART CATH AND CORONARY ANGIOGRAPHY N/A 12/26/2017   Procedure: LEFT HEART CATH AND CORONARY  ANGIOGRAPHY;  Surgeon: Lorretta Harp, MD;  Location: [facility];  Service: Cardiovascular;  Laterality: N/A;  . THYROID SURGERY       Allergies  Allergen Reactions  . Hydrocodone Nausea And Vomiting  . Morphine Swelling    REACTION: swelling  . Pineapple Swelling    Throat swelling      Family History  Problem Relation Age of Onset  . Arthritis Mother   . Migraines Mother   . Cancer Sister   . Cancer Maternal Grandmother      Social History Kendra [name] reports that she has never smoked. She has never used smokeless tobacco. Kendra [name] reports no history of alcohol use.   Review of Systems CONSTITUTIONAL: No weight loss, fever, chills, weakness or fatigue.  HEENT: Eyes: No visual loss, blurred vision, double vision or yellow sclerae.No hearing loss, sneezing,  congestion, runny nose or sore throat.  SKIN: No rash or itching.  CARDIOVASCULAR:  RESPIRATORY: No shortness of breath, cough or sputum.  GASTROINTESTINAL: No anorexia, nausea, vomiting or diarrhea. No abdominal pain or blood.  GENITOURINARY: No burning on urination, no polyuria NEUROLOGICAL: No headache, dizziness, syncope, paralysis, ataxia, numbness or tingling in the extremities. No change in bowel or bladder control.  MUSCULOSKELETAL: No muscle, back pain, joint pain or stiffness.  LYMPHATICS: No enlarged nodes. No history of splenectomy.  PSYCHIATRIC: No history of depression or anxiety.  ENDOCRINOLOGIC: No reports of sweating, cold or heat intolerance. No polyuria or polydipsia.  Marland Kitchen   Physical Examination There were no vitals filed for this visit. There were no vitals filed for this visit.  Gen: resting comfortably, no acute distress HEENT: no scleral icterus, pupils equal round and reactive, no palptable cervical adenopathy,  CV Resp: Clear to auscultation bilaterally GI: abdomen is soft, non-tender, non-distended, normal bowel sounds, no hepatosplenomegaly MSK: extremities are warm, no edema.  Skin: warm, no rash Neuro:  no focal deficits Psych: appropriate affect   Diagnostic Studies  12/2017 echo Study Conclusions   - Left ventricle: The cavity size was normal. Wall thickness was  increased in a pattern of moderate LVH. Systolic function was  normal. The estimated ejection fraction was in the range of 60%  to 65%. Wall motion was normal; there were no regional wall  motion abnormalities. Doppler parameters are consistent with  abnormal left ventricular relaxation (grade 1 diastolic  dysfunction).  - Aortic valve: Sclerosis without stenosis.   12/2017 cath IMPRESSION: Kendra [name] has essentially normal coronary arteries and normal filling pressures.  I believe her chest pain is noncardiac.  The sheath was removed and a TR band was placed on the right  wrist to achieve patent hemostasis.  She stable to undergo systemic any coagulation with a novel oral anticoagulant given her DVT and can be discharged home either today or tomorrow by the tried hospitalist service.  04/2018 event monitor  Sinus rhythm and sinus tachycardia. No significant arrhythmias.  No symptoms reported.   Assessment and Plan        Arnoldo Lenis, M.D., F.A.C.C.

## 2020-06-09 ENCOUNTER — Telehealth: Payer: Self-pay | Admitting: *Deleted

## 2020-06-09 DIAGNOSIS — Z86718 Personal history of other venous thrombosis and embolism: Secondary | ICD-10-CM

## 2020-06-09 NOTE — Telephone Encounter (Signed)
Husband notified order placed.

## 2020-06-09 NOTE — Telephone Encounter (Signed)
-----   Message from Erlene Quan, [place] sent at 06/09/2020  1:05 PM EST ----- Her CT was negative.  She should have lower extremity venous dopplers done to r/o recurrent DVT- if unable to do Friday can be done next week  LUKE KILROY PA-C 06/09/2020 1:05 PM

## 2020-06-10 LAB — URINE CULTURE: Culture: <10000 — AB

## 2020-06-15 ENCOUNTER — Other Ambulatory Visit: Payer: Self-pay

## 2020-06-15 ENCOUNTER — Ambulatory Visit (HOSPITAL_COMMUNITY)
Admission: RE | Admit: 2020-06-15 | Discharge: 2020-06-15 | Disposition: A | Discharge status: Home / Self Care | Payer: Medicare HMO | Source: Ambulatory Visit | Attending: Cardiology | Admitting: Cardiology

## 2020-06-15 DIAGNOSIS — M79661 Pain in right lower leg: Secondary | ICD-10-CM | POA: Diagnosis not present

## 2020-06-15 DIAGNOSIS — M79662 Pain in left lower leg: Secondary | ICD-10-CM | POA: Diagnosis not present

## 2020-06-15 DIAGNOSIS — Z86718 Personal history of other venous thrombosis and embolism: Secondary | ICD-10-CM | POA: Diagnosis not present

## 2020-06-21 ENCOUNTER — Telehealth: Payer: Self-pay | Admitting: *Deleted

## 2020-06-21 ENCOUNTER — Other Ambulatory Visit: Payer: Self-pay | Admitting: Family Medicine

## 2020-06-21 ENCOUNTER — Telehealth: Payer: Self-pay | Admitting: General Practice

## 2020-06-21 DIAGNOSIS — G44329 Chronic post-traumatic headache, not intractable: Secondary | ICD-10-CM

## 2020-06-21 NOTE — Telephone Encounter (Signed)
Called to give pt test results. No answer. Left msg to call back

## 2020-06-21 NOTE — Telephone Encounter (Signed)
Please see note below , pt request for a different neurologist . First available is not till Feb 2022.

## 2020-06-21 NOTE — Telephone Encounter (Signed)
New urgent referral sent.

## 2020-06-21 NOTE — Telephone Encounter (Signed)
-----   Message from Erlene Quan, [place] sent at 06/21/2020  2:40 PM EST ----- Please let the patient know her LE dopplers showed no evidence of blood clots.  Kerin Ransom PA-C 06/21/2020 2:40 PM

## 2020-06-21 NOTE — Telephone Encounter (Signed)
PATIENTS DAUGHTER IS CALLING REGARDING RECENT REFERRAL FOR NEUROLOGY  REFERRAL WAS MADE AS A STAT BUT THE WAIT FOR VISIT IS TOO FAR OUT. PT S DAUGHTERS STATES THEY WANTED PT TO WAIT TILL February / pt was told to call our office and request that provider change   referral request to an Urgent or stat request
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
## 2020-06-22 ENCOUNTER — Encounter: Payer: Self-pay | Admitting: General Practice

## 2020-06-22 ENCOUNTER — Ambulatory Visit (INDEPENDENT_AMBULATORY_CARE_PROVIDER_SITE_OTHER): Payer: Medicare HMO | Admitting: General Practice

## 2020-06-22 ENCOUNTER — Other Ambulatory Visit: Payer: Self-pay

## 2020-06-22 VITALS — BP 140/88 | HR 71 | Ht 63.0 in | Wt 230.0 lb

## 2020-06-22 DIAGNOSIS — E782 Mixed hyperlipidemia: Secondary | ICD-10-CM | POA: Diagnosis not present

## 2020-06-22 DIAGNOSIS — I10 Essential (primary) hypertension: Secondary | ICD-10-CM

## 2020-06-22 DIAGNOSIS — R079 Chest pain, unspecified: Secondary | ICD-10-CM

## 2020-06-22 NOTE — Patient Instructions (Signed)
Medication Instructions:  Your physician recommends that you continue on your current medications as directed. Please refer to the Current Medication list given to you today.  *If you need a refill on your cardiac medications before your next appointment, please call your pharmacy*   Lab Work: None today If you have labs (blood work) drawn today and your tests are completely normal, you will receive your results only by: Marland Kitchen MyChart Message (if you have MyChart) OR . A paper copy in the mail If you have any lab test that is abnormal or we need to change your treatment, we will call you to review the results.   Testing/Procedures: None today   Follow-Up: At [facility], you and your health needs are our priority.  As part of our continuing mission to provide you with exceptional heart care, we have created designated Provider Care Teams.  These Care Teams include your primary Cardiologist (physician) and Advanced Practice Providers (APPs -  Physician Assistants and Nurse Practitioners) who all work together to provide you with the care you need, when you need it.  We recommend signing up for the patient portal called "MyChart".  Sign up information is provided on this After Visit Summary.  MyChart is used to connect with patients for Virtual Visits (Telemedicine).  Patients are able to view lab/test results, encounter notes, upcoming appointments, etc.  Non-urgent messages can be sent to your provider as well.   To learn more about what you can do with MyChart, go to [email or web address].    Your next appointment:   12 month(s)  The format for your next appointment:   In Person  Provider:   Carlyle Dolly, MD   Other Instructions INCREASE physical activity after you are cleared by neurology  Follow the "Salty-Six" guidelines   Keep daily BP log       Thank you for choosing [facility] !

## 2020-07-06 ENCOUNTER — Telehealth: Payer: Self-pay | Admitting: Family Medicine

## 2020-07-06 NOTE — Telephone Encounter (Signed)
Patients husband went to [facility] and they could not see patient / patient is wondering why/ please look into this referral and reach out to patient .  Please reach out to patients husband  Gabriele Zwilling [phone]

## 2020-07-06 NOTE — Telephone Encounter (Signed)
Patient states that they gave him a fax number , so it seems we need to fax something to [phone]

## 2020-07-06 NOTE — Telephone Encounter (Signed)
Returned the call to patient and [facility] ph # [phone] to verify reason for pt not able to be seen. LVM with neurologist office for a call back , pt husband informed. Will try again

## 2020-07-07 NOTE — Telephone Encounter (Signed)
Raquel Sarna calling from [facility] all they need is a referral request faxed over for her headaches .and they can see the patient   Please fax over a referral to [phone]  And fax [phone]

## 2020-07-07 NOTE — Telephone Encounter (Signed)
Faxed over referral form in chart to [facility] fax # [phone]

## 2020-07-27 ENCOUNTER — Telehealth: Payer: Self-pay | Admitting: General Practice

## 2020-07-27 ENCOUNTER — Other Ambulatory Visit: Payer: Self-pay | Admitting: Family Medicine

## 2020-07-27 DIAGNOSIS — R350 Frequency of micturition: Secondary | ICD-10-CM

## 2020-07-27 MED ORDER — SOLIFENACIN SUCCINATE 10 MG PO TABS: 10.0000 mg | ORAL_TABLET | Freq: Every day | ORAL | 3 refills | Status: DC

## 2020-08-16 DIAGNOSIS — R251 Tremor, unspecified: Secondary | ICD-10-CM | POA: Diagnosis not present

## 2020-08-16 DIAGNOSIS — G43711 Chronic migraine without aura, intractable, with status migrainosus: Secondary | ICD-10-CM | POA: Diagnosis not present

## 2020-08-16 DIAGNOSIS — G44309 Post-traumatic headache, unspecified, not intractable: Secondary | ICD-10-CM | POA: Diagnosis not present

## 2020-08-17 ENCOUNTER — Ambulatory Visit: Payer: Medicare HMO | Admitting: Specialist

## 2020-08-22 ENCOUNTER — Other Ambulatory Visit: Payer: Self-pay

## 2020-08-22 ENCOUNTER — Ambulatory Visit (INDEPENDENT_AMBULATORY_CARE_PROVIDER_SITE_OTHER): Payer: Medicare HMO

## 2020-08-22 ENCOUNTER — Ambulatory Visit: Payer: Medicare HMO | Admitting: Specialist

## 2020-08-22 ENCOUNTER — Encounter: Payer: Self-pay | Admitting: Specialist

## 2020-08-22 VITALS — BP 115/82 | HR 83 | Ht 63.0 in | Wt 236.0 lb

## 2020-08-22 DIAGNOSIS — M545 Low back pain, unspecified: Secondary | ICD-10-CM

## 2020-08-22 DIAGNOSIS — M4316 Spondylolisthesis, lumbar region: Secondary | ICD-10-CM | POA: Diagnosis not present

## 2020-08-22 DIAGNOSIS — M1712 Unilateral primary osteoarthritis, left knee: Secondary | ICD-10-CM | POA: Diagnosis not present

## 2020-08-22 DIAGNOSIS — M48062 Spinal stenosis, lumbar region with neurogenic claudication: Secondary | ICD-10-CM

## 2020-08-22 DIAGNOSIS — M1711 Unilateral primary osteoarthritis, right knee: Secondary | ICD-10-CM | POA: Diagnosis not present

## 2020-08-22 DIAGNOSIS — M5136 Other intervertebral disc degeneration, lumbar region: Secondary | ICD-10-CM

## 2020-08-22 DIAGNOSIS — M170 Bilateral primary osteoarthritis of knee: Secondary | ICD-10-CM

## 2020-08-22 MED ORDER — MELOXICAM 15 MG PO TBDP: 1.0000 | ORAL_TABLET | Freq: Every day | ORAL | 3 refills | Status: DC

## 2020-08-22 NOTE — Patient Instructions (Signed)
Avoid bending, stooping and avoid lifting weights greater than 10 lbs. Avoid prolong standing and walking. Avoid frequent bending and stooping  No lifting greater than 10 lbs. May use ice or moist heat for pain. Weight loss is of benefit. Handicap license is approved. Exercise best with a stationary bicycle. Start meloxicam for arthritis pain and to decrease joint inflamation. Continue the gabapentin. Start PT at [facility]. 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 Well padded shoes help. Ice the knee that is suffering from osteoarthritis, only real proven treatments are Ice the knee 2-3 times a day 15-20 mins at a time.-3 times a day 15-20 mins at a time. Hot showers in the AM.  Injection with steroid may be of benefit. Hemp CBD capsules, amazon.com 5,000-7,000 mg per bottle, 60 capsules per bottle, take one capsule twice a day. Cane in the left hand to use with left leg weight bearing. Follow-Up Instructions: No follow-ups on file.
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
## 2020-08-29 ENCOUNTER — Encounter: Payer: Self-pay | Admitting: Family Medicine

## 2020-08-29 ENCOUNTER — Ambulatory Visit (INDEPENDENT_AMBULATORY_CARE_PROVIDER_SITE_OTHER): Payer: Medicare HMO | Admitting: Family Medicine

## 2020-08-29 ENCOUNTER — Other Ambulatory Visit: Payer: Self-pay

## 2020-08-29 VITALS — BP 124/81 | HR 74 | Temp 98.2°F | Ht 63.0 in | Wt 226.0 lb

## 2020-08-29 DIAGNOSIS — F3341 Major depressive disorder, recurrent, in partial remission: Secondary | ICD-10-CM | POA: Diagnosis not present

## 2020-08-29 DIAGNOSIS — Z1231 Encounter for screening mammogram for malignant neoplasm of breast: Secondary | ICD-10-CM

## 2020-08-29 DIAGNOSIS — I10 Essential (primary) hypertension: Secondary | ICD-10-CM

## 2020-08-29 DIAGNOSIS — E785 Hyperlipidemia, unspecified: Secondary | ICD-10-CM | POA: Diagnosis not present

## 2020-08-29 DIAGNOSIS — E213 Hyperparathyroidism, unspecified: Secondary | ICD-10-CM | POA: Diagnosis not present

## 2020-08-29 DIAGNOSIS — E559 Vitamin D deficiency, unspecified: Secondary | ICD-10-CM

## 2020-08-29 DIAGNOSIS — E2839 Other primary ovarian failure: Secondary | ICD-10-CM

## 2020-08-29 DIAGNOSIS — I2699 Other pulmonary embolism without acute cor pulmonale: Secondary | ICD-10-CM

## 2020-08-29 MED ORDER — PROPRANOLOL HCL 20 MG PO TABS: 20.0000 mg | ORAL_TABLET | Freq: Two times a day (BID) | ORAL | 1 refills | Status: DC

## 2020-08-29 NOTE — Patient Instructions (Addendum)
Needs Tetanus, shingrix, Pneumonia vaccine  Health Maintenance After Age 77 After age 13, you are at a higher risk for certain long-term diseases and infections as well as injuries from falls. Falls are a major cause of broken bones and head injuries in people who are older than age 23. Getting regular preventive care can help to keep you healthy and well. Preventive care includes getting regular testing and making lifestyle changes as recommended by your health care provider. Talk with your health care provider about:  Which screenings and tests you should have. A screening is a test that checks for a disease when you have no symptoms.  A diet and exercise plan that is right for you.    What should I know about screenings and tests to prevent falls? Screening and testing are the best ways to find a health problem early. Early diagnosis and treatment give you the best chance of managing medical conditions that are common after age 74. Certain conditions and lifestyle choices may make you more likely to have a fall. Your health care provider may recommend:  Regular vision checks. Poor vision and conditions such as cataracts can make you more likely to have a fall. If you wear glasses, make sure to get your prescription updated if your vision changes.  Medicine review. Work with your health care provider to regularly review all of the medicines you are taking, including over-the-counter medicines. Ask your health care provider about any side effects that may make you more likely to have a fall. Tell your health care provider if any medicines that you take make you feel dizzy or sleepy.  Osteoporosis screening. Osteoporosis is a condition that causes the bones to get weaker. This can make the bones weak and cause them to break more easily.  Blood pressure screening. Blood pressure changes and medicines to control blood pressure can make you feel dizzy.  Strength and balance checks. Your health care  provider may recommend certain tests to check your strength and balance while standing, walking, or changing positions.  Foot health exam. Foot pain and numbness, as well as not wearing proper footwear, can make you more likely to have a fall.  Depression screening. You may be more likely to have a fall if you have a fear of falling, feel emotionally low, or feel unable to do activities that you used to do.  Alcohol use screening. Using too much alcohol can affect your balance and may make you more likely to have a fall. What actions can I take to lower my risk of falls? General instructions  Talk with your health care provider about your risks for falling. Tell your health care provider if: ? You fall. Be sure to tell your health care provider about all falls, even ones that seem minor. ? You feel dizzy, sleepy, or off-balance.  Take over-the-counter and prescription medicines only as told by your health care provider. These include any supplements.  Eat a healthy diet and maintain a healthy weight. A healthy diet includes low-fat dairy products, low-fat (lean) meats, and fiber from whole grains, beans, and lots of fruits and vegetables. Home safety  Remove any tripping hazards, such as rugs, cords, and clutter.  Install safety equipment such as grab bars in bathrooms and safety rails on stairs.  Keep rooms and walkways well-lit. Activity  Follow a regular exercise program to stay fit. This will help you maintain your balance. Ask your health care provider what types of exercise are appropriate for  you.  If you need a cane or walker, use it as recommended by your health care provider.  Wear supportive shoes that have nonskid soles.   Lifestyle  Do not drink alcohol if your health care provider tells you not to drink.  If you drink alcohol, limit how much you have: ? 0-1 drink a day for women. ? 0-2 drinks a day for men.  Be aware of how much alcohol is in your drink. In the  [place] one drink equals one typical bottle of beer (12 oz), one-half glass of wine (5 oz), or one shot of hard liquor (1 oz).  Do not use any products that contain nicotine or tobacco, such as cigarettes and e-cigarettes. If you need help quitting, ask your health care provider. Summary  Having a healthy lifestyle and getting preventive care can help to protect your health and wellness after age 65.  Screening and testing are the best way to find a health problem early and help you avoid having a fall. Early diagnosis and treatment give you the best chance for managing medical conditions that are more common for people who are older than age 71.  Falls are a major cause of broken bones and head injuries in people who are older than age 77. Take precautions to prevent a fall at home.  Work with your health care provider to learn what changes you can make to improve your health and wellness and to prevent falls. This information is not intended to replace advice given to you by your health care provider. Make sure you discuss any questions you have with your health care provider. Document Revised: 10/30/2018 Document Reviewed: 05/22/2017 Elsevier Patient Education  2021 [employer].       If you have lab work done today you will be contacted with your lab results within the next 2 weeks.  If you have not heard from [place] then please contact us. The fastest way to get your results is to register for My Chart.   IF you received an x-ray today, you will receive an invoice from [facility]. Please contact [facility] at [phone] with questions or concerns regarding your invoice.   IF you received labwork today, you will receive an invoice from [facility]. Please contact [facility] at [phone] with questions or concerns regarding your invoice.   Our billing staff will not be able to assist you with questions regarding bills from these companies.  You will be contacted with  the lab results as soon as they are available. The fastest way to get your results is to activate your My Chart account. Instructions are located on the last page of this paperwork. If you have not heard from [place] regarding the results in 2 weeks, please contact this office.
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
## 2020-08-30 ENCOUNTER — Other Ambulatory Visit: Payer: Self-pay | Admitting: Family Medicine

## 2020-08-30 DIAGNOSIS — E785 Hyperlipidemia, unspecified: Secondary | ICD-10-CM

## 2020-08-30 LAB — LIPID PANEL
Chol/HDL Ratio: 6.3 ratio — ABNORMAL HIGH (ref 0.0–4.4)
Cholesterol, Total: 332 mg/dL — ABNORMAL HIGH (ref 100–199)
HDL: 53 mg/dL (ref >39)
LDL Chol Calc (NIH): 227 mg/dL — ABNORMAL HIGH (ref 0–99)
Triglycerides: 255 mg/dL — ABNORMAL HIGH (ref 0–149)
VLDL Cholesterol Cal: 52 mg/dL — ABNORMAL HIGH (ref 5–40)

## 2020-08-30 LAB — CMP14+EGFR
ALT: 12 IU/L (ref 0–32)
AST: 19 IU/L (ref 0–40)
Albumin/Globulin Ratio: 1.9 (ref 1.2–2.2)
Albumin: 4.2 g/dL (ref 3.7–4.7)
Alkaline Phosphatase: 129 IU/L — ABNORMAL HIGH (ref 44–121)
BUN/Creatinine Ratio: 23 (ref 12–28)
BUN: 17 mg/dL (ref 8–27)
Bilirubin Total: 0.3 mg/dL (ref 0.0–1.2)
CO2: 26 mmol/L (ref 20–29)
Calcium: 9.5 mg/dL (ref 8.7–10.3)
Chloride: 98 mmol/L (ref 96–106)
Creatinine, Ser: 0.73 mg/dL (ref 0.57–1.00)
GFR calc Af Amer: 93 mL/min/{1.73_m2} (ref >59)
GFR calc non Af Amer: 80 mL/min/{1.73_m2} (ref >59)
Globulin, Total: 2.2 g/dL (ref 1.5–4.5)
Glucose: 81 mg/dL (ref 65–99)
Potassium: 4.8 mmol/L (ref 3.5–5.2)
Sodium: 138 mmol/L (ref 134–144)
Total Protein: 6.4 g/dL (ref 6.0–8.5)

## 2020-08-30 LAB — VITAMIN D 25 HYDROXY (VIT D DEFICIENCY, FRACTURES): Vit D, 25-Hydroxy: 35.5 ng/mL (ref 30.0–100.0)

## 2020-08-30 LAB — TSH: TSH: 1.47 u[IU]/mL (ref 0.450–4.500)

## 2020-08-30 MED ORDER — ROSUVASTATIN CALCIUM 10 MG PO TABS: 10.0000 mg | ORAL_TABLET | Freq: Every day | ORAL | 3 refills | Status: DC

## 2020-08-30 NOTE — Progress Notes (Signed)
Cholesterol remains extremely elevated. I would recommend continuing the pravastatin at two tabs a day until the bottle is empty, but when you get refills switching to a medication called rosuvastatin which is a high intensity statin. I have sent this medication to her pharmacy

## 2020-09-12 ENCOUNTER — Encounter (HOSPITAL_COMMUNITY): Payer: Self-pay | Admitting: Physical Therapy

## 2020-09-12 ENCOUNTER — Ambulatory Visit (HOSPITAL_COMMUNITY): Payer: Medicare HMO | Attending: Specialist | Admitting: Physical Therapy

## 2020-09-12 ENCOUNTER — Other Ambulatory Visit: Payer: Self-pay

## 2020-09-12 DIAGNOSIS — M545 Low back pain, unspecified: Secondary | ICD-10-CM | POA: Insufficient documentation

## 2020-09-12 DIAGNOSIS — M25562 Pain in left knee: Secondary | ICD-10-CM | POA: Diagnosis not present

## 2020-09-12 DIAGNOSIS — M6281 Muscle weakness (generalized): Secondary | ICD-10-CM | POA: Diagnosis not present

## 2020-09-12 DIAGNOSIS — M25561 Pain in right knee: Secondary | ICD-10-CM | POA: Insufficient documentation

## 2020-09-12 DIAGNOSIS — R2689 Other abnormalities of gait and mobility: Secondary | ICD-10-CM | POA: Insufficient documentation

## 2020-09-12 DIAGNOSIS — R29898 Other symptoms and signs involving the musculoskeletal system: Secondary | ICD-10-CM | POA: Diagnosis not present
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
## 2020-09-15 ENCOUNTER — Ambulatory Visit (HOSPITAL_COMMUNITY): Payer: Medicare HMO | Admitting: Physical Therapy

## 2020-09-15 ENCOUNTER — Other Ambulatory Visit: Payer: Self-pay

## 2020-09-15 DIAGNOSIS — M25562 Pain in left knee: Secondary | ICD-10-CM

## 2020-09-15 DIAGNOSIS — M6281 Muscle weakness (generalized): Secondary | ICD-10-CM

## 2020-09-15 DIAGNOSIS — M25561 Pain in right knee: Secondary | ICD-10-CM

## 2020-09-15 DIAGNOSIS — M545 Low back pain, unspecified: Secondary | ICD-10-CM | POA: Diagnosis not present

## 2020-09-15 DIAGNOSIS — R2689 Other abnormalities of gait and mobility: Secondary | ICD-10-CM

## 2020-09-15 DIAGNOSIS — R29898 Other symptoms and signs involving the musculoskeletal system: Secondary | ICD-10-CM
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
## 2020-09-15 NOTE — Patient Instructions (Signed)
Access Code: [email or web address] URL: [email or web address] Date: 09/15/2020 Prepared by: Mitzi Hansen Binnie Droessler  Exercises Hooklying Isometric Hip Abduction with Belt - 1 x daily - 7 x weekly - 10 reps - 10 hold Supine Hip Adduction Isometric with Ball - 1 x daily - 7 x weekly - 10 reps - 10 second hold

## 2020-09-20 ENCOUNTER — Encounter (HOSPITAL_COMMUNITY): Payer: Self-pay | Admitting: Physical Therapy

## 2020-09-20 ENCOUNTER — Other Ambulatory Visit: Payer: Self-pay

## 2020-09-20 ENCOUNTER — Ambulatory Visit (HOSPITAL_COMMUNITY): Payer: Medicare HMO | Attending: Specialist | Admitting: Physical Therapy

## 2020-09-20 DIAGNOSIS — M6281 Muscle weakness (generalized): Secondary | ICD-10-CM | POA: Insufficient documentation

## 2020-09-20 DIAGNOSIS — M25612 Stiffness of left shoulder, not elsewhere classified: Secondary | ICD-10-CM | POA: Insufficient documentation

## 2020-09-20 DIAGNOSIS — R29898 Other symptoms and signs involving the musculoskeletal system: Secondary | ICD-10-CM | POA: Insufficient documentation

## 2020-09-20 DIAGNOSIS — M25512 Pain in left shoulder: Secondary | ICD-10-CM | POA: Insufficient documentation

## 2020-09-20 DIAGNOSIS — M25562 Pain in left knee: Secondary | ICD-10-CM | POA: Diagnosis not present

## 2020-09-20 DIAGNOSIS — R2689 Other abnormalities of gait and mobility: Secondary | ICD-10-CM | POA: Diagnosis not present

## 2020-09-20 DIAGNOSIS — M25561 Pain in right knee: Secondary | ICD-10-CM | POA: Insufficient documentation

## 2020-09-20 DIAGNOSIS — M545 Low back pain, unspecified: Secondary | ICD-10-CM | POA: Insufficient documentation

## 2020-09-20 NOTE — Patient Instructions (Signed)
Access Code: [email or web address] URL: [email or web address] Date: 09/20/2020 Prepared by: Lennox - 1 x daily - 7 x weekly - 2 sets - 10 reps [place] [employer] - 1 x daily - 7 x weekly - 10 reps - 5-10 second hold Elevaci&oacute;n de tal&oacute;n con apoyo en mostrador. - 1 x daily - 7 x weekly - 2 sets - 10 reps
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
## 2020-09-21 ENCOUNTER — Ambulatory Visit: Payer: Medicare HMO | Admitting: Specialist

## 2020-09-21 ENCOUNTER — Encounter: Payer: Self-pay | Admitting: Specialist

## 2020-09-21 VITALS — BP 160/104 | HR 71 | Ht 63.0 in | Wt 226.0 lb

## 2020-09-21 DIAGNOSIS — M25512 Pain in left shoulder: Secondary | ICD-10-CM | POA: Diagnosis not present

## 2020-09-21 DIAGNOSIS — M48062 Spinal stenosis, lumbar region with neurogenic claudication: Secondary | ICD-10-CM

## 2020-09-21 DIAGNOSIS — M778 Other enthesopathies, not elsewhere classified: Secondary | ICD-10-CM

## 2020-09-21 DIAGNOSIS — R202 Paresthesia of skin: Secondary | ICD-10-CM

## 2020-09-21 DIAGNOSIS — M1711 Unilateral primary osteoarthritis, right knee: Secondary | ICD-10-CM

## 2020-09-21 DIAGNOSIS — M4316 Spondylolisthesis, lumbar region: Secondary | ICD-10-CM

## 2020-09-21 DIAGNOSIS — M5136 Other intervertebral disc degeneration, lumbar region: Secondary | ICD-10-CM | POA: Diagnosis not present

## 2020-09-21 DIAGNOSIS — R200 Anesthesia of skin: Secondary | ICD-10-CM | POA: Diagnosis not present

## 2020-09-21 DIAGNOSIS — M1712 Unilateral primary osteoarthritis, left knee: Secondary | ICD-10-CM

## 2020-09-21 MED ORDER — METHYLPREDNISOLONE ACETATE 40 MG/ML IJ SUSP
40.0000 mg | INTRAMUSCULAR | Status: AC | PRN
  Administered 2020-09-21: 40 mg via INTRA_ARTICULAR

## 2020-09-21 MED ORDER — BUPIVACAINE HCL 0.25 % IJ SOLN
4.0000 mL | INTRAMUSCULAR | Status: AC | PRN
  Administered 2020-09-21: 4 mL via INTRA_ARTICULAR

## 2020-09-21 NOTE — Progress Notes (Signed)
Office Visit Note   Patient: Kendra [name]           Date of Birth: Mar 01, 1946           MRN: [id number] Visit Date: 09/21/2020              Requested by: No referring provider defined for this encounter. PCP: Patient, No Pcp Per   Assessment & Plan: Visit Diagnoses:  1. Shoulder tendonitis, left   2. Spondylolisthesis, lumbar region   3. Degenerative disc disease, lumbar   4. Unilateral primary osteoarthritis, right knee   5. Unilateral primary osteoarthritis, left knee   6. Spinal stenosis of lumbar region with neurogenic claudication   7. Numbness and tingling of left hand   8. Acute pain of left shoulder     Plan: Avoid bending, stooping and avoid lifting weights greater than 10 lbs. Avoid prolong standing and walking. Avoid frequent bending and stooping  No lifting greater than 10 lbs. May use ice or moist heat for pain. Weight loss is of benefit. Handicap license is approved. Obtain MRI of the lumbar spine to assess for spinal stenosis. Avoid overhead lifting and overhead use of the arms. Pillows to keep from sleeping directly on the shoulders Limited lifting to less than 10 lbs. Ice or heat for relief. NSAIDs are helpful, such as alleve or motrin, be careful not to use in excess as they place burdens on the kidney. Stretching exercise help and strengthening is helpful to build endurance. Will add left shoulder physical therapy  Follow-Up Instructions: Return in about 4 weeks (around 10/19/2020).   Orders:  Orders Placed This Encounter  Procedures  . Large Joint Inj: L subacromial bursa   No orders of the defined types were placed in this encounter.     Procedures: Large Joint Inj: L subacromial bursa on 09/21/2020 11:12 AM Indications: pain Details: 25 G 1.5 in needle, posterior approach  Arthrogram: No  Medications: 40 mg methylPREDNISolone acetate 40 MG/ML; 4 mL bupivacaine 0.25 % Outcome: tolerated well, no immediate complications Procedure,  treatment alternatives, risks and benefits explained, specific risks discussed. Consent was given by the patient. Immediately prior to procedure a time out was called to verify the correct patient, procedure, equipment, support staff and site/side marked as required. Patient was prepped and draped in the usual sterile fashion.       Clinical Data: No additional findings.   Subjective: Chief Complaint  Patient presents with  . Lower Back - Follow-up  . Right Knee - Follow-up  . Left Knee - Follow-up    75 year old female with history of urinary frequency, urgency. She has difficulty walking distances can not walk  A distance due to back pain and this is hurting a lot with standing and walking.  She has pain into the left shoulder and difficulty raising the left shoulder with pain over the left shoulder and dysfunction. No neck pain, has complaints of left arm numbness and tingling.   Review of Systems  Constitutional: Negative.   HENT: Negative.   Eyes: Negative.   Respiratory: Negative.   Cardiovascular: Negative.   Gastrointestinal: Negative.   Endocrine: Negative.   Genitourinary: Negative.   Musculoskeletal: Negative.   Skin: Negative.   Allergic/Immunologic: Negative.   Neurological: Negative.   Hematological: Negative.   Psychiatric/Behavioral: Negative.      Objective: Vital Signs: BP (!) 160/104 (BP Location: Left Arm, Patient Position: Sitting)   Pulse 71   Ht 5\' 3"  (1.6 m)  Wt 226 lb (102.5 kg)   BMI 40.03 kg/m   Physical Exam Constitutional:      Appearance: She is well-developed and well-nourished.  HENT:     Head: Normocephalic and atraumatic.  Eyes:     Extraocular Movements: EOM normal.     Pupils: Pupils are equal, round, and reactive to light.  Pulmonary:     Effort: Pulmonary effort is normal.     Breath sounds: Normal breath sounds.  Abdominal:     General: Bowel sounds are normal.     Palpations: Abdomen is soft.  Musculoskeletal:      Cervical back: Normal range of motion and neck supple.     Lumbar back: Negative right straight leg raise test and negative left straight leg raise test.  Skin:    General: Skin is warm and dry.  Neurological:     Mental Status: She is alert and oriented to person, place, and time.  Psychiatric:        Mood and Affect: Mood and affect normal.        Behavior: Behavior normal.        Thought Content: Thought content normal.        Judgment: Judgment normal.     Back Exam   Tenderness  The patient is experiencing tenderness in the lumbar.  Range of Motion  Lateral bend right: abnormal  Lateral bend left: abnormal  Rotation right: abnormal  Rotation left: abnormal   Muscle Strength  Right Quadriceps:  5/5  Left Quadriceps:  4/5  Right Hamstrings:  4/5  Left Hamstrings:  4/5   Tests  Straight leg raise right: negative Straight leg raise left: negative  Other  Toe walk: abnormal Heel walk: abnormal  Comments:  Bilateral knee OA with varus deformity. Unable to reach shoes and socks, husband helps with socks.    Left Shoulder Exam   Tenderness  The patient is experiencing tenderness in the acromion.  Range of Motion  Active abduction: abnormal  Passive abduction: normal  Extension: normal  External rotation: normal  Forward flexion: normal   Muscle Strength  Abduction: 4/5  Internal rotation: 5/5  External rotation: 5/5  Supraspinatus: 4/5  Subscapularis: 5/5  Biceps: 5/5   Tests  Apprehension: negative Impingement: positive  Other  Erythema: absent Scars: absent Sensation: normal Pulse: present       Specialty Comments:  No specialty comments available.  Imaging: No results found.   PMFS History: Patient Active Problem List   Diagnosis Date Noted  . Recurrent major depressive disorder, in partial remission ([place]) 08/29/2020  . History of DVT (deep vein thrombosis) 12/26/2017  . Acute pulmonary embolism without acute cor pulmonale (HCC)    . Chest pain 12/25/2017  . Essential hypertension 12/25/2017  . Mixed hyperlipidemia 12/25/2017  . Chest pain of uncertain etiology   . ABSCESS, TOOTH 09/06/2010  . Morbid obesity ([place]) 07/12/2010  . CHOLECYSTECTOMY, HX OF 04/25/2010  . Depression 03/17/2010  . HYPERTENSION, BENIGN ESSENTIAL 10/28/2009  . ALLERGIC RHINITIS 10/12/2009  . OSA on CPAP 10/12/2009  . HYPERPARATHYROIDISM UNSPECIFIED 07/19/2009  . KNEE PAIN, BILATERAL 01/11/2009  . LABYRINTHITIS, ACUTE 09/29/2008  . BACK PAIN, LUMBAR 08/03/2008  . CALCANEAL SPUR, RIGHT 07/14/2008  . OSTEOPENIA 07/14/2008  . 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 GASTROENTERITIS, ACUTE 01/08/2008  . HEMATURIA UNSPECIFIED 01/08/2008  . TEMPOROMANDIBULAR JOINT PAIN 07/09/2007  . Dyslipidemia 03/07/2007  . GERD 03/07/2007  . OSTEOARTHROSIS, LOCAL, PRIMARY, LOWER LEG 03/07/2007  . MIGRAINE HEADACHE 03/03/2007  . CERVICAL  MUSCLE STRAIN 03/03/2007  . HELICOBACTER PYLORI INFECTION, HX OF 12/21/2005   Past Medical History:  Diagnosis Date  . Arthritis   . Chronic back pain   . 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 Depression   . GERD (gastroesophageal reflux disease)   . Hypercholesterolemia   . Hypertension   . Kidney stone    stones  . Migraine   . Panic attacks    daily, worse the 15th of each month  . Thyroid disease     Family History  Problem Relation Age of Onset  . Arthritis Mother   . Migraines Mother   . Cancer Sister   . Cancer Maternal Grandmother     Past Surgical History:  Procedure Laterality Date  . ABDOMINAL HYSTERECTOMY     r ovary removal  . APPENDECTOMY    . CHOLECYSTECTOMY    . CYSTOSCOPY    . LEFT HEART CATH AND CORONARY ANGIOGRAPHY N/A 12/26/2017   Procedure: LEFT HEART CATH AND CORONARY ANGIOGRAPHY;  Surgeon: Lorretta Harp, MD;  Location: [facility];  Service: Cardiovascular;  Laterality: N/A;  . THYROID SURGERY     Social History   Occupational History  .  Not on file  Tobacco Use  . Smoking status: Never Smoker  . Smokeless tobacco: Never Used  Vaping Use  . Vaping Use: Never used  Substance and Sexual Activity  . Alcohol use: No  . Drug use: No  . Sexual activity: Never    Birth control/protection: None

## 2020-09-21 NOTE — Patient Instructions (Signed)
Avoid bending, stooping and avoid lifting weights greater than 10 lbs. Avoid prolong standing and walking. Avoid frequent bending and stooping  No lifting greater than 10 lbs. May use ice or moist heat for pain. Weight loss is of benefit. Handicap license is approved. Obtain MRI of the lumbar spine to assess for spinal stenosis. Avoid overhead lifting and overhead use of the arms. Pillows to keep from sleeping directly on the shoulders Limited lifting to less than 10 lbs. Ice or heat for relief. NSAIDs are helpful, such as alleve or motrin, be careful not to use in excess as they place burdens on the kidney. Stretching exercise help and strengthening is helpful to build endurance. Will add left shoulder physical therapy

## 2020-09-22 ENCOUNTER — Ambulatory Visit (HOSPITAL_COMMUNITY): Payer: Medicare HMO | Admitting: Physical Therapy

## 2020-09-22 ENCOUNTER — Telehealth (HOSPITAL_COMMUNITY): Payer: Self-pay | Admitting: Physical Therapy

## 2020-09-22 NOTE — Telephone Encounter (Signed)
S/w husband pt has high blood pressure and it's very high today- he may take her to the ED. Please cx this apptment.

## 2020-09-27 ENCOUNTER — Ambulatory Visit (HOSPITAL_COMMUNITY): Payer: Medicare HMO | Admitting: Physical Therapy

## 2020-09-29 ENCOUNTER — Encounter (HOSPITAL_COMMUNITY): Payer: Self-pay

## 2020-09-29 ENCOUNTER — Ambulatory Visit (HOSPITAL_COMMUNITY): Payer: Medicare HMO

## 2020-09-29 ENCOUNTER — Other Ambulatory Visit: Payer: Self-pay

## 2020-09-29 VITALS — BP 140/79

## 2020-09-29 DIAGNOSIS — R2689 Other abnormalities of gait and mobility: Secondary | ICD-10-CM

## 2020-09-29 DIAGNOSIS — M6281 Muscle weakness (generalized): Secondary | ICD-10-CM

## 2020-09-29 DIAGNOSIS — M25512 Pain in left shoulder: Secondary | ICD-10-CM | POA: Diagnosis not present

## 2020-09-29 DIAGNOSIS — R29898 Other symptoms and signs involving the musculoskeletal system: Secondary | ICD-10-CM | POA: Diagnosis not present

## 2020-09-29 DIAGNOSIS — M25562 Pain in left knee: Secondary | ICD-10-CM | POA: Diagnosis not present

## 2020-09-29 DIAGNOSIS — M545 Low back pain, unspecified: Secondary | ICD-10-CM

## 2020-09-29 DIAGNOSIS — M25612 Stiffness of left shoulder, not elsewhere classified: Secondary | ICD-10-CM | POA: Diagnosis not present

## 2020-09-29 DIAGNOSIS — M25561 Pain in right knee: Secondary | ICD-10-CM
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
## 2020-09-29 NOTE — Patient Instructions (Signed)
Bridge    [place], piernas dobladas. Inspire presionando con las caderas [place]. [place] [employer], extienda la espalda inferior. Espire rodando [employer] columna desde [place]. Repita 10 veces. Haga 2 sesiones diarias.  aguante posicion por 3 segundos.  [email or web address].[email or web address]   Copyright  VHI. All rights reserved.

## 2020-10-04 ENCOUNTER — Ambulatory Visit (HOSPITAL_COMMUNITY): Payer: Medicare HMO | Admitting: Physical Therapy

## 2020-10-04 ENCOUNTER — Encounter (HOSPITAL_COMMUNITY): Payer: Self-pay | Admitting: Physical Therapy

## 2020-10-04 ENCOUNTER — Other Ambulatory Visit: Payer: Self-pay

## 2020-10-04 DIAGNOSIS — R29898 Other symptoms and signs involving the musculoskeletal system: Secondary | ICD-10-CM

## 2020-10-04 DIAGNOSIS — R2689 Other abnormalities of gait and mobility: Secondary | ICD-10-CM

## 2020-10-04 DIAGNOSIS — M25512 Pain in left shoulder: Secondary | ICD-10-CM | POA: Diagnosis not present

## 2020-10-04 DIAGNOSIS — M6281 Muscle weakness (generalized): Secondary | ICD-10-CM | POA: Diagnosis not present

## 2020-10-04 DIAGNOSIS — M25561 Pain in right knee: Secondary | ICD-10-CM

## 2020-10-04 DIAGNOSIS — M545 Low back pain, unspecified: Secondary | ICD-10-CM | POA: Diagnosis not present

## 2020-10-04 DIAGNOSIS — M25562 Pain in left knee: Secondary | ICD-10-CM

## 2020-10-04 DIAGNOSIS — M25612 Stiffness of left shoulder, not elsewhere classified: Secondary | ICD-10-CM | POA: Diagnosis not present
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
## 2020-10-04 NOTE — Patient Instructions (Signed)
Access Code: [email or web address] URL: [email or web address] Date: 10/04/2020 Prepared by: Mitzi Hansen Mackenzi Krogh  Exercises Sit to Stand with Arms Crossed - 1 x daily - 7 x weekly - 1 sets - 10 reps Standing Hip Abduction with Counter Support - 1 x daily - 7 x weekly - 1 sets - 10 reps

## 2020-10-06 ENCOUNTER — Other Ambulatory Visit: Payer: Self-pay

## 2020-10-06 ENCOUNTER — Ambulatory Visit (HOSPITAL_COMMUNITY): Payer: Medicare HMO

## 2020-10-06 ENCOUNTER — Encounter (HOSPITAL_COMMUNITY): Payer: Self-pay

## 2020-10-06 DIAGNOSIS — R2689 Other abnormalities of gait and mobility: Secondary | ICD-10-CM | POA: Diagnosis not present

## 2020-10-06 DIAGNOSIS — M25561 Pain in right knee: Secondary | ICD-10-CM | POA: Diagnosis not present

## 2020-10-06 DIAGNOSIS — M25562 Pain in left knee: Secondary | ICD-10-CM

## 2020-10-06 DIAGNOSIS — M545 Low back pain, unspecified: Secondary | ICD-10-CM | POA: Diagnosis not present

## 2020-10-06 DIAGNOSIS — R29898 Other symptoms and signs involving the musculoskeletal system: Secondary | ICD-10-CM | POA: Diagnosis not present

## 2020-10-06 DIAGNOSIS — M25612 Stiffness of left shoulder, not elsewhere classified: Secondary | ICD-10-CM | POA: Diagnosis not present

## 2020-10-06 DIAGNOSIS — M6281 Muscle weakness (generalized): Secondary | ICD-10-CM

## 2020-10-06 DIAGNOSIS — M25512 Pain in left shoulder: Secondary | ICD-10-CM | POA: Diagnosis not present
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
## 2020-10-07 ENCOUNTER — Telehealth: Payer: Self-pay | Admitting: Family Medicine

## 2020-10-07 ENCOUNTER — Other Ambulatory Visit: Payer: Self-pay | Admitting: Cardiology

## 2020-10-07 ENCOUNTER — Ambulatory Visit: Payer: Medicare HMO

## 2020-10-07 ENCOUNTER — Encounter: Payer: Self-pay | Admitting: *Deleted

## 2020-10-07 ENCOUNTER — Encounter: Payer: Self-pay | Admitting: Family Medicine

## 2020-10-07 ENCOUNTER — Ambulatory Visit: Payer: Medicare HMO | Admitting: Family Medicine

## 2020-10-07 VITALS — BP 128/82 | HR 77 | Ht 63.0 in | Wt 222.2 lb

## 2020-10-07 DIAGNOSIS — R079 Chest pain, unspecified: Secondary | ICD-10-CM

## 2020-10-07 DIAGNOSIS — R0789 Other chest pain: Secondary | ICD-10-CM

## 2020-10-07 DIAGNOSIS — R002 Palpitations: Secondary | ICD-10-CM

## 2020-10-07 DIAGNOSIS — R0602 Shortness of breath: Secondary | ICD-10-CM

## 2020-10-07 DIAGNOSIS — E782 Mixed hyperlipidemia: Secondary | ICD-10-CM | POA: Diagnosis not present

## 2020-10-07 DIAGNOSIS — I10 Essential (primary) hypertension: Secondary | ICD-10-CM

## 2020-10-07 NOTE — Patient Instructions (Signed)
Medication Instructions:  Continue all current medications.  Labwork: none  Testing/Procedures:  Your physician has requested that you have a lexiscan myoview. For further information please visit [email or web address]. Please follow instruction sheet, as given.  Your physician has requested that you have an echocardiogram. Echocardiography is a painless test that uses sound waves to create images of your heart. It provides your doctor with information about the size and shape of your heart and how well your heart's chambers and valves are working. This procedure takes approximately one hour. There are no restrictions for this procedure.  Your physician has recommended that you wear a 14 day event monitor. Event monitors are medical devices that record the heart's electrical activity. Doctors most often [place] these monitors to diagnose arrhythmias. Arrhythmias are problems with the speed or rhythm of the heartbeat. The monitor is a small, portable device. You can wear one while you do your normal daily activities. This is usually used to diagnose what is causing palpitations/syncope (passing out).  Office will contact with results via phone or letter.    Follow-Up: 6 - 8 weeks   Any Other Special Instructions Will Be Listed Below (If Applicable).  If you need a refill on your cardiac medications before your next appointment, please call your pharmacy.

## 2020-10-07 NOTE — Telephone Encounter (Signed)
Pre-cert Verification for the following procedure    lexi - cp  -echo - sob -14 day zio monitor - palps   DATE:    10/28/2020  LOCATION: [place]

## 2020-10-11 ENCOUNTER — Encounter (HOSPITAL_COMMUNITY): Payer: Self-pay | Admitting: Physical Therapy

## 2020-10-11 ENCOUNTER — Other Ambulatory Visit: Payer: Self-pay

## 2020-10-11 ENCOUNTER — Ambulatory Visit (HOSPITAL_COMMUNITY): Payer: Medicare HMO | Admitting: Occupational Therapy

## 2020-10-11 ENCOUNTER — Ambulatory Visit (HOSPITAL_COMMUNITY): Payer: Medicare HMO | Admitting: Physical Therapy

## 2020-10-11 DIAGNOSIS — R2689 Other abnormalities of gait and mobility: Secondary | ICD-10-CM

## 2020-10-11 DIAGNOSIS — R29898 Other symptoms and signs involving the musculoskeletal system: Secondary | ICD-10-CM

## 2020-10-11 DIAGNOSIS — G43711 Chronic migraine without aura, intractable, with status migrainosus: Secondary | ICD-10-CM | POA: Diagnosis not present

## 2020-10-11 DIAGNOSIS — M25561 Pain in right knee: Secondary | ICD-10-CM | POA: Diagnosis not present

## 2020-10-11 DIAGNOSIS — M6281 Muscle weakness (generalized): Secondary | ICD-10-CM

## 2020-10-11 DIAGNOSIS — M25612 Stiffness of left shoulder, not elsewhere classified: Secondary | ICD-10-CM | POA: Diagnosis not present

## 2020-10-11 DIAGNOSIS — M25562 Pain in left knee: Secondary | ICD-10-CM

## 2020-10-11 DIAGNOSIS — G44309 Post-traumatic headache, unspecified, not intractable: Secondary | ICD-10-CM | POA: Diagnosis not present

## 2020-10-11 DIAGNOSIS — M25512 Pain in left shoulder: Secondary | ICD-10-CM | POA: Diagnosis not present

## 2020-10-11 DIAGNOSIS — M545 Low back pain, unspecified: Secondary | ICD-10-CM | POA: Diagnosis not present

## 2020-10-11 DIAGNOSIS — R251 Tremor, unspecified: Secondary | ICD-10-CM | POA: Diagnosis not present
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
## 2020-10-11 NOTE — Patient Instructions (Signed)
Access Code: [email or web address] URL: [email or web address] Date: 10/11/2020 Prepared by: [facility] Alese Furniss  Exercises Parado con Un Pie Detrs del Otro - 1 x daily - 7 x weekly - 3 reps - 30 second hold Flexin de Rodilla de Pie RADM ([employer]) - 1 x daily - 7 x weekly - 2 sets - 10 reps Marcha de pie - 1 x daily - 7 x weekly - 2 sets - 10 reps

## 2020-10-13 ENCOUNTER — Encounter (HOSPITAL_COMMUNITY): Payer: Self-pay

## 2020-10-13 ENCOUNTER — Other Ambulatory Visit: Payer: Self-pay

## 2020-10-13 ENCOUNTER — Ambulatory Visit (HOSPITAL_COMMUNITY): Payer: Medicare HMO

## 2020-10-13 VITALS — BP 147/82

## 2020-10-13 DIAGNOSIS — M25562 Pain in left knee: Secondary | ICD-10-CM | POA: Diagnosis not present

## 2020-10-13 DIAGNOSIS — M6281 Muscle weakness (generalized): Secondary | ICD-10-CM

## 2020-10-13 DIAGNOSIS — R2689 Other abnormalities of gait and mobility: Secondary | ICD-10-CM

## 2020-10-13 DIAGNOSIS — M25561 Pain in right knee: Secondary | ICD-10-CM | POA: Diagnosis not present

## 2020-10-13 DIAGNOSIS — R29898 Other symptoms and signs involving the musculoskeletal system: Secondary | ICD-10-CM | POA: Diagnosis not present

## 2020-10-13 DIAGNOSIS — M545 Low back pain, unspecified: Secondary | ICD-10-CM

## 2020-10-13 DIAGNOSIS — M25512 Pain in left shoulder: Secondary | ICD-10-CM | POA: Diagnosis not present

## 2020-10-13 DIAGNOSIS — M25612 Stiffness of left shoulder, not elsewhere classified: Secondary | ICD-10-CM | POA: Diagnosis not present
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
## 2020-10-14 ENCOUNTER — Ambulatory Visit: Payer: Medicare HMO | Admitting: Specialist

## 2020-10-18 ENCOUNTER — Ambulatory Visit (HOSPITAL_COMMUNITY): Payer: Medicare HMO | Admitting: Physical Therapy

## 2020-10-19 ENCOUNTER — Ambulatory Visit
Admission: RE | Admit: 2020-10-19 | Discharge: 2020-10-19 | Disposition: A | Discharge status: Home / Self Care | Payer: Medicare HMO | Source: Ambulatory Visit | Attending: Family Medicine | Admitting: Family Medicine

## 2020-10-19 ENCOUNTER — Encounter (HOSPITAL_COMMUNITY): Payer: Self-pay | Admitting: Specialist

## 2020-10-19 ENCOUNTER — Ambulatory Visit (HOSPITAL_COMMUNITY): Payer: Medicare HMO | Admitting: Specialist

## 2020-10-19 ENCOUNTER — Other Ambulatory Visit: Payer: Self-pay

## 2020-10-19 DIAGNOSIS — M25562 Pain in left knee: Secondary | ICD-10-CM | POA: Diagnosis not present

## 2020-10-19 DIAGNOSIS — R29898 Other symptoms and signs involving the musculoskeletal system: Secondary | ICD-10-CM

## 2020-10-19 DIAGNOSIS — M545 Low back pain, unspecified: Secondary | ICD-10-CM | POA: Diagnosis not present

## 2020-10-19 DIAGNOSIS — R2689 Other abnormalities of gait and mobility: Secondary | ICD-10-CM | POA: Diagnosis not present

## 2020-10-19 DIAGNOSIS — Z780 Asymptomatic menopausal state: Secondary | ICD-10-CM | POA: Diagnosis not present

## 2020-10-19 DIAGNOSIS — M6281 Muscle weakness (generalized): Secondary | ICD-10-CM | POA: Diagnosis not present

## 2020-10-19 DIAGNOSIS — M25512 Pain in left shoulder: Secondary | ICD-10-CM | POA: Diagnosis not present

## 2020-10-19 DIAGNOSIS — E2839 Other primary ovarian failure: Secondary | ICD-10-CM

## 2020-10-19 DIAGNOSIS — M25561 Pain in right knee: Secondary | ICD-10-CM | POA: Diagnosis not present

## 2020-10-19 DIAGNOSIS — M25612 Stiffness of left shoulder, not elsewhere classified: Secondary | ICD-10-CM | POA: Diagnosis not present

## 2020-10-19 DIAGNOSIS — M8589 Other specified disorders of bone density and structure, multiple sites: Secondary | ICD-10-CM | POA: Diagnosis not present

## 2020-10-19 NOTE — Patient Instructions (Signed)
    External Rotation (Assistive)    Sintese con el codo doblado y mantngalo contra su cuerpo. Deslice el brazo hacia afuera usando la superficie de la mesa como soporte. Repita _15___ veces. Haga __3__ sesiones por da. Actividad: Use este movimiento para empujar objetos livianos fuera de su camino.  Copyright  V[employer]. All rights reserved.  Abduction (Passive)    Coloque el brazo al [employer] del cuerpo, apoyado sobre una mesa, baje la cabeza hacia el brazo, manteniendo el tronco lejos de la mesa. Mantenga ____ segundos. Repita __15__ veces. Haga __3__ sesiones por da.  Copyright  VHI. All rights reserved.  Flexion (Passive)    Sentado, deslice el antebrazo [employer], inclinndose desde la cintura. Hgalo hasta sentir un estiramiento cercano a la axila. Mantenga ____ segundos. Repita __15__ veces. Haga __3__ sesiones por da.
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
## 2020-10-20 ENCOUNTER — Ambulatory Visit (HOSPITAL_COMMUNITY): Payer: Medicare HMO | Admitting: Physical Therapy

## 2020-10-25 ENCOUNTER — Other Ambulatory Visit: Payer: Self-pay

## 2020-10-25 ENCOUNTER — Ambulatory Visit (HOSPITAL_COMMUNITY): Payer: Medicare HMO | Attending: Specialist | Admitting: Physical Therapy

## 2020-10-25 ENCOUNTER — Encounter (HOSPITAL_COMMUNITY): Payer: Self-pay | Admitting: Physical Therapy

## 2020-10-25 DIAGNOSIS — R2689 Other abnormalities of gait and mobility: Secondary | ICD-10-CM | POA: Diagnosis not present

## 2020-10-25 DIAGNOSIS — M25561 Pain in right knee: Secondary | ICD-10-CM

## 2020-10-25 DIAGNOSIS — M25612 Stiffness of left shoulder, not elsewhere classified: Secondary | ICD-10-CM | POA: Diagnosis not present

## 2020-10-25 DIAGNOSIS — M25562 Pain in left knee: Secondary | ICD-10-CM

## 2020-10-25 DIAGNOSIS — M545 Low back pain, unspecified: Secondary | ICD-10-CM

## 2020-10-25 DIAGNOSIS — M25512 Pain in left shoulder: Secondary | ICD-10-CM | POA: Insufficient documentation

## 2020-10-25 DIAGNOSIS — M6281 Muscle weakness (generalized): Secondary | ICD-10-CM

## 2020-10-25 DIAGNOSIS — R29898 Other symptoms and signs involving the musculoskeletal system: Secondary | ICD-10-CM
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
## 2020-10-26 ENCOUNTER — Ambulatory Visit (HOSPITAL_COMMUNITY): Payer: Medicare HMO

## 2020-10-27 ENCOUNTER — Other Ambulatory Visit: Payer: Self-pay

## 2020-10-27 ENCOUNTER — Encounter (HOSPITAL_COMMUNITY): Payer: Self-pay | Admitting: Physical Therapy

## 2020-10-27 ENCOUNTER — Ambulatory Visit (HOSPITAL_COMMUNITY): Payer: Medicare HMO | Admitting: Physical Therapy

## 2020-10-27 DIAGNOSIS — M545 Low back pain, unspecified: Secondary | ICD-10-CM

## 2020-10-27 DIAGNOSIS — M25561 Pain in right knee: Secondary | ICD-10-CM

## 2020-10-27 DIAGNOSIS — M25512 Pain in left shoulder: Secondary | ICD-10-CM | POA: Diagnosis not present

## 2020-10-27 DIAGNOSIS — R2689 Other abnormalities of gait and mobility: Secondary | ICD-10-CM

## 2020-10-27 DIAGNOSIS — R29898 Other symptoms and signs involving the musculoskeletal system: Secondary | ICD-10-CM

## 2020-10-27 DIAGNOSIS — M25612 Stiffness of left shoulder, not elsewhere classified: Secondary | ICD-10-CM | POA: Diagnosis not present

## 2020-10-27 DIAGNOSIS — M25562 Pain in left knee: Secondary | ICD-10-CM

## 2020-10-27 DIAGNOSIS — M6281 Muscle weakness (generalized): Secondary | ICD-10-CM
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
## 2020-10-28 ENCOUNTER — Encounter (HOSPITAL_COMMUNITY)
Admission: RE | Admit: 2020-10-28 | Discharge: 2020-10-28 | Disposition: A | Discharge status: Home / Self Care | Payer: Medicare HMO | Source: Ambulatory Visit | Attending: Family Medicine | Admitting: Family Medicine

## 2020-10-28 ENCOUNTER — Ambulatory Visit (HOSPITAL_COMMUNITY)
Admission: RE | Admit: 2020-10-28 | Discharge: 2020-10-28 | Disposition: A | Discharge status: Home / Self Care | Payer: Medicare HMO | Source: Ambulatory Visit | Attending: Family Medicine | Admitting: Family Medicine

## 2020-10-28 ENCOUNTER — Other Ambulatory Visit: Payer: Self-pay

## 2020-10-28 ENCOUNTER — Encounter (HOSPITAL_COMMUNITY): Payer: Self-pay

## 2020-10-28 ENCOUNTER — Encounter (HOSPITAL_BASED_OUTPATIENT_CLINIC_OR_DEPARTMENT_OTHER)
Admission: RE | Admit: 2020-10-28 | Discharge: 2020-10-28 | Disposition: A | Discharge status: Home / Self Care | Payer: Medicare HMO | Source: Ambulatory Visit | Attending: Family Medicine | Admitting: Family Medicine

## 2020-10-28 DIAGNOSIS — R079 Chest pain, unspecified: Secondary | ICD-10-CM | POA: Diagnosis not present

## 2020-10-28 DIAGNOSIS — R0602 Shortness of breath: Secondary | ICD-10-CM | POA: Diagnosis not present

## 2020-10-28 DIAGNOSIS — R0789 Other chest pain: Secondary | ICD-10-CM | POA: Diagnosis not present

## 2020-10-28 LAB — NM MYOCAR MULTI W/SPECT W/WALL MOTION / EF
LV dias vol: 61 mL (ref 46–106)
LV sys vol: 35 mL
Peak HR: 101 {beats}/min
RATE: 0.51
Rest HR: 76 {beats}/min
SDS: 0
SRS: 1
SSS: 1
TID: 0.87

## 2020-10-28 LAB — ECHOCARDIOGRAM COMPLETE
Area-P 1/2: 3.65 cm2
S' Lateral: 2.5 cm

## 2020-10-28 MED ORDER — TECHNETIUM TC 99M TETROFOSMIN IV KIT
30.0000 | PACK | Freq: Once | INTRAVENOUS | Status: AC | PRN
  Administered 2020-10-28: 33 via INTRAVENOUS

## 2020-10-28 MED ORDER — REGADENOSON 0.4 MG/5ML IV SOLN
INTRAVENOUS | Status: AC
  Administered 2020-10-28: 0.4 mg via INTRAVENOUS
  Filled 2020-10-28: qty 5

## 2020-10-28 MED ORDER — TECHNETIUM TC 99M TETROFOSMIN IV KIT
10.0000 | PACK | Freq: Once | INTRAVENOUS | Status: AC | PRN
  Administered 2020-10-28: 9.2 via INTRAVENOUS

## 2020-10-28 MED ORDER — SODIUM CHLORIDE FLUSH 0.9 % IV SOLN
INTRAVENOUS | Status: AC
  Administered 2020-10-28: 10 mL via INTRAVENOUS
  Filled 2020-10-28: qty 10

## 2020-10-28 NOTE — Progress Notes (Signed)
*  PRELIMINARY RESULTS* Echocardiogram 2D Echocardiogram has been performed.  Kendra [name] 10/28/2020, 1:17 PM

## 2020-11-01 ENCOUNTER — Ambulatory Visit (HOSPITAL_COMMUNITY): Payer: Medicare HMO | Admitting: Occupational Therapy

## 2020-11-01 ENCOUNTER — Encounter (HOSPITAL_COMMUNITY): Payer: Self-pay | Admitting: Occupational Therapy

## 2020-11-01 ENCOUNTER — Other Ambulatory Visit: Payer: Self-pay

## 2020-11-01 DIAGNOSIS — R2689 Other abnormalities of gait and mobility: Secondary | ICD-10-CM | POA: Diagnosis not present

## 2020-11-01 DIAGNOSIS — M25612 Stiffness of left shoulder, not elsewhere classified: Secondary | ICD-10-CM

## 2020-11-01 DIAGNOSIS — M25512 Pain in left shoulder: Secondary | ICD-10-CM | POA: Diagnosis not present

## 2020-11-01 DIAGNOSIS — M6281 Muscle weakness (generalized): Secondary | ICD-10-CM | POA: Diagnosis not present

## 2020-11-01 DIAGNOSIS — R29898 Other symptoms and signs involving the musculoskeletal system: Secondary | ICD-10-CM

## 2020-11-01 DIAGNOSIS — M545 Low back pain, unspecified: Secondary | ICD-10-CM | POA: Diagnosis not present

## 2020-11-01 DIAGNOSIS — M25561 Pain in right knee: Secondary | ICD-10-CM | POA: Diagnosis not present

## 2020-11-01 DIAGNOSIS — M25562 Pain in left knee: Secondary | ICD-10-CM | POA: Diagnosis not present

## 2020-11-01 NOTE — Therapy (Signed)
[place], [place], [address] Phone: [phone]   Fax:  [phone]  Occupational Therapy Treatment  Patient Details  Name: Kendra [name] MRN: [id number] Date of Birth: 05-Nov-1945 Referring Provider (OT): Dr. Basil Dess   Encounter Date: 11/01/2020   OT End of Session - 11/01/20 1155    Visit Number 2    Number of Visits 16    Date for OT Re-Evaluation 12/14/20   mini-reassessment on 11/13/20   Authorization Type [employer] requesting 16 visits    Authorization - Visit Number 2    Authorization - Number of Visits 16    Progress Note Due on Visit 10    OT Start Time 1125   pt arrived late   OT Stop Time 1200    OT Time Calculation (min) 35 min    Activity Tolerance Patient tolerated treatment well    Behavior During Therapy [facility] for tasks assessed/performed           Past Medical History:  Diagnosis Date  . Arthritis   . Chronic back pain   . Conversion disorder    caused by stress of daughter's death in 2009/10/25  . Depression   . GERD (gastroesophageal reflux disease)   . Hypercholesterolemia   . Hypertension   . Kidney stone    stones  . Migraine   . Panic attacks    daily, worse the 15th of each month  . Thyroid disease     Past Surgical History:  Procedure Laterality Date  . ABDOMINAL HYSTERECTOMY     r ovary removal  . APPENDECTOMY    . CHOLECYSTECTOMY    . CYSTOSCOPY    . LEFT HEART CATH AND CORONARY ANGIOGRAPHY N/A 12/26/2017   Procedure: LEFT HEART CATH AND CORONARY ANGIOGRAPHY;  Surgeon: Lorretta Harp, MD;  Location: [facility];  Service: Cardiovascular;  Laterality: N/A;  . THYROID SURGERY      There were no vitals filed for this visit.   Subjective Assessment - 11/01/20 1127    Subjective  S: I woke up feeling weak all over.    Currently in Pain? Yes    Pain Score 6     Pain Location Shoulder    Pain Orientation Left    Pain Descriptors / Indicators Sore    Pain Type  Chronic pain    Pain Radiating Towards upper arm    Pain Onset More than a month ago    Pain Frequency Constant    Aggravating Factors  movement, lifting    Pain Relieving Factors nothing    Effect of Pain on Daily Activities moderate              [facility] OT Assessment - 11/01/20 1126      Assessment   Medical Diagnosis Left Shoulder Tendonitis      Precautions   Precautions Fall                    OT Treatments/Exercises (OP) - 11/01/20 1128      Exercises   Exercises Shoulder      Shoulder Exercises: Supine   Protraction PROM;5 reps;AAROM;10 reps    Horizontal ABduction PROM;5 reps;AAROM;10 reps    External Rotation PROM;5 reps;AROM;10 reps    Internal Rotation PROM;5 reps;AROM;10 reps    Flexion PROM;5 reps;AAROM;10 reps    ABduction PROM;5 reps;AAROM;10 reps      Shoulder Exercises: Seated   Extension AROM;10 reps  Row AROM;10 reps    Protraction AROM;10 reps    Flexion AAROM;10 reps      Manual Therapy   Manual Therapy Myofascial release    Manual therapy comments manual therapy completed seperately from all other interventions this date.    Myofascial Release myofascial release and manual stretching to left upper arm, scapular, and shoulder region to decrease pain and fascial restrictions and improve pain free mobility in her left shoulder region.                    OT Short Term Goals - 11/01/20 1136      OT SHORT TERM GOAL #1   Title Patient will be educated and independent with HEP for improved left shoulder mobility.    Time 4    Period Weeks    Status On-going    Target Date 11/16/20      OT SHORT TERM GOAL #2   Title Patient will improve LUE P/ROM to WNL for improved ability to don shirts independently.    Time 4    Period Weeks    Status On-going      OT SHORT TERM GOAL #3   Title Patient will improve left shoulder strength to 4/5 or better for improved independence with carrying shopping bags.    Time 4    Period  Weeks    Status On-going      OT SHORT TERM GOAL #4   Title Patient will decrease pain in her left shoulder to 5/10 or better when completing functional tasks.    Time 4    Period Weeks    Status On-going             OT Long Term Goals - 11/01/20 1136      OT LONG TERM GOAL #1   Title Patient will return to PLOF with all desired daily tasks.    Time 8    Period Weeks    Status On-going      OT LONG TERM GOAL #2   Title Patient will improve left shoulder a/rom to [facility] for improved ability to reach overhead and behind her back during ADL completion.    Time 8    Period Weeks    Status On-going      OT LONG TERM GOAL #3   Title Patient will improve left shoulder strength to 5/5 for improved ability to lift grocery bags.    Time 8    Period Weeks    Status On-going      OT LONG TERM GOAL #4   Title Patient will decrease pain in her left shoulder to 3/10 or better when completing daily tasks.    Time 8    Period Weeks    Status On-going                 Plan - 11/01/20 1202    Clinical Impression Statement A: Pt presents for first session since evaluation on 10/19/20. Pt reports soreness but is using her arm at home as much as she can. Pt arrived late therefore no manual therapy completed. Pt with ROM WFL during passive stretching. Initiated AA/ROM, verbal cuing for form and technique. Printed pt new schedule with OT appts.    Body Structure / Function / Physical Skills ADL;Strength;Pain;IADL;ROM;Fascial restriction;Muscle spasms    Plan P: FOTO. Continue with AA/ROM in supine and sitting, update HEP    OT Home Exercise Plan eval:  table slides    Consulted  and Agree with Plan of Care Patient           Patient will benefit from skilled therapeutic intervention in order to improve the following deficits and impairments:   Body Structure / Function / Physical Skills: ADL,Strength,Pain,IADL,ROM,Fascial restriction,Muscle spasms       Visit Diagnosis: Other  symptoms and signs involving the musculoskeletal system  Acute pain of left shoulder  Stiffness of left shoulder, not elsewhere classified    Problem List Patient Active Problem List   Diagnosis Date Noted  . Recurrent major depressive disorder, in partial remission ([place]) 08/29/2020  . History of DVT (deep vein thrombosis) 12/26/2017  . Acute pulmonary embolism without acute cor pulmonale (HCC)   . Chest pain 12/25/2017  . Essential hypertension 12/25/2017  . Mixed hyperlipidemia 12/25/2017  . Chest pain of uncertain etiology   . ABSCESS, TOOTH 09/06/2010  . Morbid obesity ([place]) 07/12/2010  . CHOLECYSTECTOMY, HX OF 04/25/2010  . Depression 03/17/2010  . HYPERTENSION, BENIGN ESSENTIAL 10/28/2009  . ALLERGIC RHINITIS 10/12/2009  . OSA on CPAP 10/12/2009  . HYPERPARATHYROIDISM UNSPECIFIED 07/19/2009  . KNEE PAIN, BILATERAL 01/11/2009  . LABYRINTHITIS, ACUTE 09/29/2008  . BACK PAIN, LUMBAR 08/03/2008  . CALCANEAL SPUR, RIGHT 07/14/2008  . OSTEOPENIA 07/14/2008  . 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 GASTROENTERITIS, ACUTE 01/08/2008  . HEMATURIA UNSPECIFIED 01/08/2008  . TEMPOROMANDIBULAR JOINT PAIN 07/09/2007  . Dyslipidemia 03/07/2007  . GERD 03/07/2007  . OSTEOARTHROSIS, LOCAL, PRIMARY, LOWER LEG 03/07/2007  . MIGRAINE HEADACHE 03/03/2007  . CERVICAL MUSCLE STRAIN 03/03/2007  . HELICOBACTER PYLORI INFECTION, HX OF 12/21/2005   Guadelupe Sabin, OTR/L  [phone] 11/01/2020, 12:05 PM  [facility] [address] [place], [place], [address] Phone: [phone]   Fax:  [phone]  Name: ZURY FAZZINO MRN: [id number] Date of Birth: 1945/08/21

## 2020-11-02 ENCOUNTER — Encounter: Payer: Self-pay | Admitting: Cardiology

## 2020-11-02 ENCOUNTER — Telehealth: Payer: Self-pay | Admitting: *Deleted

## 2020-11-02 DIAGNOSIS — R0602 Shortness of breath: Secondary | ICD-10-CM

## 2020-11-02 NOTE — Telephone Encounter (Signed)
Laurine Blazer, LPN  7/79/3903 0:09 PM EDT Back to Top     Husband Felicita Gage) notified & verbalized understanding. Copy to pcp.    No pcp listed.

## 2020-11-02 NOTE — Telephone Encounter (Signed)
-----   Message from Verta Ellen., NP sent at 10/30/2020 10:12 PM EDT ----- Please call the patient and let her know the echocardiogram showed she had good pumping function of the heart. The left pumping chamber is a little more muscular than usual.  Best mangment is to keep BP at or below 130/ 80. She has a mildly lekaing valve on the left side. This is not uncommon with aging. The study was unable to see if she had any abnormal motion of the heart muscle. The Physician is suggesting a limited echocardiogram with contrast  ( Definity ) to check. Ask her is she is ok with getting another study to check. If she is okay with it go ahead and order limited echo with Definity . She will need a spanish interpreter when you call

## 2020-11-02 NOTE — Telephone Encounter (Signed)
Laurine Blazer, LPN  4/97/0263 7:85 PM EDT Back to Top     Husband Felicita Gage) notified & verbalized understanding. No pcp listed. Order entered for limited echo with contrast.

## 2020-11-02 NOTE — Telephone Encounter (Signed)
-----   Message from Verta Ellen., NP sent at 10/30/2020 10:05 PM EDT ----- Please call the patient and let her know the stress test did not show any evidence of lack of blood flow to the heart muscle. It was considered low risk per interpreting physician. She will need a spanish interpreter. Had a recent echo also. Will send note,

## 2020-11-03 DIAGNOSIS — R002 Palpitations: Secondary | ICD-10-CM | POA: Diagnosis not present

## 2020-11-04 ENCOUNTER — Encounter (HOSPITAL_COMMUNITY): Payer: Medicare HMO | Admitting: Occupational Therapy

## 2020-11-08 ENCOUNTER — Encounter (HOSPITAL_COMMUNITY): Payer: Self-pay | Admitting: Occupational Therapy

## 2020-11-08 ENCOUNTER — Other Ambulatory Visit: Payer: Self-pay

## 2020-11-08 ENCOUNTER — Ambulatory Visit (HOSPITAL_COMMUNITY): Payer: Medicare HMO | Admitting: Occupational Therapy

## 2020-11-08 DIAGNOSIS — M6281 Muscle weakness (generalized): Secondary | ICD-10-CM | POA: Diagnosis not present

## 2020-11-08 DIAGNOSIS — M545 Low back pain, unspecified: Secondary | ICD-10-CM | POA: Diagnosis not present

## 2020-11-08 DIAGNOSIS — R2689 Other abnormalities of gait and mobility: Secondary | ICD-10-CM | POA: Diagnosis not present

## 2020-11-08 DIAGNOSIS — M25512 Pain in left shoulder: Secondary | ICD-10-CM | POA: Diagnosis not present

## 2020-11-08 DIAGNOSIS — M25612 Stiffness of left shoulder, not elsewhere classified: Secondary | ICD-10-CM | POA: Diagnosis not present

## 2020-11-08 DIAGNOSIS — R29898 Other symptoms and signs involving the musculoskeletal system: Secondary | ICD-10-CM | POA: Diagnosis not present

## 2020-11-08 DIAGNOSIS — M25561 Pain in right knee: Secondary | ICD-10-CM | POA: Diagnosis not present

## 2020-11-08 DIAGNOSIS — M25562 Pain in left knee: Secondary | ICD-10-CM | POA: Diagnosis not present

## 2020-11-08 NOTE — Therapy (Signed)
[place], [place], [address] Phone: [phone]   Fax:  [phone]  Occupational Therapy Treatment  Patient Details  Name: Kendra [name] MRN: [id number] Date of Birth: January 02, 1946 Referring Provider (OT): Dr. Basil Dess   Encounter Date: 11/08/2020   OT End of Session - 11/08/20 1202    Visit Number 3    Number of Visits 16    Date for OT Re-Evaluation 12/14/20   mini-reassessment on 11/13/20   Authorization Type [employer]    Authorization Time Period 12 visits approved 3/30-11/25/20    Authorization - Visit Number 3    Authorization - Number of Visits 12    Progress Note Due on Visit 10    OT Start Time November 02, 1116    OT Stop Time 1156    OT Time Calculation (min) 38 min    Activity Tolerance Patient tolerated treatment well    Behavior During Therapy [facility] for tasks assessed/performed           Past Medical History:  Diagnosis Date  . Arthritis   . Chronic back pain   . Conversion disorder    caused by stress of daughter's death in 11/02/09  . Depression   . GERD (gastroesophageal reflux disease)   . Hypercholesterolemia   . Hypertension   . Kidney stone    stones  . Migraine   . Panic attacks    daily, worse the 15th of each month  . Thyroid disease     Past Surgical History:  Procedure Laterality Date  . ABDOMINAL HYSTERECTOMY     r ovary removal  . APPENDECTOMY    . CHOLECYSTECTOMY    . CYSTOSCOPY    . LEFT HEART CATH AND CORONARY ANGIOGRAPHY N/A 12/26/2017   Procedure: LEFT HEART CATH AND CORONARY ANGIOGRAPHY;  Surgeon: Lorretta Harp, MD;  Location: [facility];  Service: Cardiovascular;  Laterality: N/A;  . THYROID SURGERY      There were no vitals filed for this visit.   Subjective Assessment - 11/08/20 1118    Subjective  S: it was hurting a lot yesterday    Patient is accompanied by: Interpreter   Sonia   Currently in Pain? Yes    Pain Score 7     Pain Location Shoulder    Pain  Orientation Left    Pain Descriptors / Indicators Aching;Sore    Pain Type Chronic pain    Pain Radiating Towards upper arm    Pain Onset Yesterday    Pain Frequency Constant    Aggravating Factors  movement, lifting    Pain Relieving Factors nothing    Effect of Pain on Daily Activities moderate effect on ADLs    Multiple Pain Sites No              [facility] OT Assessment - 11/08/20 1117      Assessment   Medical Diagnosis Left Shoulder Tendonitis      Precautions   Precautions Fall                    OT Treatments/Exercises (OP) - 11/08/20 1122      Exercises   Exercises Shoulder      Shoulder Exercises: Supine   Protraction PROM;5 reps;AROM;10 reps    Horizontal ABduction PROM;5 reps;AROM;10 reps    External Rotation PROM;5 reps;AROM;10 reps    Internal Rotation PROM;5 reps;AROM;10 reps    Flexion PROM;5 reps;AROM;10 reps  ABduction PROM;5 reps;AROM;10 reps      Shoulder Exercises: Seated   Protraction AAROM;10 reps    Horizontal ABduction AAROM;10 reps    External Rotation AROM;10 reps    Internal Rotation AROM;10 reps    Flexion AAROM;10 reps    Abduction AAROM;10 reps      Shoulder Exercises: ROM/Strengthening   Wall Wash 1'      Manual Therapy   Manual Therapy Myofascial release    Manual therapy comments manual therapy completed seperately from all other interventions this date.    Myofascial Release myofascial release and manual stretching to left upper arm, scapular, and shoulder region to decrease pain and fascial restrictions and improve pain free mobility in her left shoulder region.                  OT Education - 11/08/20 1150    Education Details shoulder AA/ROM    Person(s) Educated Patient    Methods Explanation;Demonstration;Handout    Comprehension Verbalized understanding            OT Short Term Goals - 11/01/20 1136      OT SHORT TERM GOAL #1   Title Patient will be educated and independent with HEP for  improved left shoulder mobility.    Time 4    Period Weeks    Status On-going    Target Date 11/16/20      OT SHORT TERM GOAL #2   Title Patient will improve LUE P/ROM to WNL for improved ability to don shirts independently.    Time 4    Period Weeks    Status On-going      OT SHORT TERM GOAL #3   Title Patient will improve left shoulder strength to 4/5 or better for improved independence with carrying shopping bags.    Time 4    Period Weeks    Status On-going      OT SHORT TERM GOAL #4   Title Patient will decrease pain in her left shoulder to 5/10 or better when completing functional tasks.    Time 4    Period Weeks    Status On-going             OT Long Term Goals - 11/01/20 1136      OT LONG TERM GOAL #1   Title Patient will return to PLOF with all desired daily tasks.    Time 8    Period Weeks    Status On-going      OT LONG TERM GOAL #2   Title Patient will improve left shoulder a/rom to [facility] for improved ability to reach overhead and behind her back during ADL completion.    Time 8    Period Weeks    Status On-going      OT LONG TERM GOAL #3   Title Patient will improve left shoulder strength to 5/5 for improved ability to lift grocery bags.    Time 8    Period Weeks    Status On-going      OT LONG TERM GOAL #4   Title Patient will decrease pain in her left shoulder to 3/10 or better when completing daily tasks.    Time 8    Period Weeks    Status On-going                 Plan - 11/08/20 1150    Clinical Impression Statement A: Pt reports she has been completing HEP at home which seems  to help. Continued with myofascial release to address fascial restrictions, passive stretching. Pt completing A/ROM in supine and AA/ROM in sitting, demonstrates ROM WFL today. Added wall wash and pulleys in flexion. Pt reporting mod fatigue at end of session. Verbal cuing for form and technique, HEP updated.    Body Structure / Function / Physical Skills  ADL;Strength;Pain;IADL;ROM;Fascial restriction;Muscle spasms    Plan P: Follow up on HEP, continue progressing to A/ROM, add proximal shoulder strengthening in supine    OT Home Exercise Plan eval:  table slides; 4/19: AA/ROM    Consulted and Agree with Plan of Care Patient           Patient will benefit from skilled therapeutic intervention in order to improve the following deficits and impairments:   Body Structure / Function / Physical Skills: ADL,Strength,Pain,IADL,ROM,Fascial restriction,Muscle spasms       Visit Diagnosis: Other symptoms and signs involving the musculoskeletal system  Acute pain of left shoulder  Stiffness of left shoulder, not elsewhere classified    Problem List Patient Active Problem List   Diagnosis Date Noted  . Recurrent major depressive disorder, in partial remission ([place]) 08/29/2020  . History of DVT (deep vein thrombosis) 12/26/2017  . Acute pulmonary embolism without acute cor pulmonale (HCC)   . Chest pain 12/25/2017  . Essential hypertension 12/25/2017  . Mixed hyperlipidemia 12/25/2017  . Chest pain of uncertain etiology   . ABSCESS, TOOTH 09/06/2010  . Morbid obesity ([place]) 07/12/2010  . CHOLECYSTECTOMY, HX OF 04/25/2010  . Depression 03/17/2010  . HYPERTENSION, BENIGN ESSENTIAL 10/28/2009  . ALLERGIC RHINITIS 10/12/2009  . OSA on CPAP 10/12/2009  . HYPERPARATHYROIDISM UNSPECIFIED 07/19/2009  . KNEE PAIN, BILATERAL 01/11/2009  . LABYRINTHITIS, ACUTE 09/29/2008  . BACK PAIN, LUMBAR 08/03/2008  . CALCANEAL SPUR, RIGHT 07/14/2008  . OSTEOPENIA 07/14/2008  . 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 GASTROENTERITIS, ACUTE 01/08/2008  . HEMATURIA UNSPECIFIED 01/08/2008  . TEMPOROMANDIBULAR JOINT PAIN 07/09/2007  . Dyslipidemia 03/07/2007  . GERD 03/07/2007  . OSTEOARTHROSIS, LOCAL, PRIMARY, LOWER LEG 03/07/2007  . MIGRAINE HEADACHE 03/03/2007  . CERVICAL MUSCLE STRAIN 03/03/2007  . HELICOBACTER PYLORI INFECTION, HX OF 12/21/2005   Guadelupe Sabin, OTR/L  [phone] 11/08/2020, 12:03 PM  [facility] [address] [place], [place], [address] Phone: [phone]   Fax:  [phone]  Name: ERILYN PEARMAN MRN: [id number] Date of Birth: [birth year]

## 2020-11-08 NOTE — Patient Instructions (Signed)

## 2020-11-09 ENCOUNTER — Ambulatory Visit (HOSPITAL_COMMUNITY): Payer: Medicare HMO

## 2020-11-09 ENCOUNTER — Encounter (HOSPITAL_COMMUNITY): Payer: Medicare HMO

## 2020-11-09 ENCOUNTER — Encounter (HOSPITAL_COMMUNITY): Payer: Self-pay

## 2020-11-09 VITALS — BP 134/88 | HR 79

## 2020-11-09 DIAGNOSIS — M6281 Muscle weakness (generalized): Secondary | ICD-10-CM | POA: Diagnosis not present

## 2020-11-09 DIAGNOSIS — R29898 Other symptoms and signs involving the musculoskeletal system: Secondary | ICD-10-CM

## 2020-11-09 DIAGNOSIS — R2689 Other abnormalities of gait and mobility: Secondary | ICD-10-CM

## 2020-11-09 DIAGNOSIS — M25562 Pain in left knee: Secondary | ICD-10-CM

## 2020-11-09 DIAGNOSIS — M25561 Pain in right knee: Secondary | ICD-10-CM

## 2020-11-09 DIAGNOSIS — M25512 Pain in left shoulder: Secondary | ICD-10-CM | POA: Diagnosis not present

## 2020-11-09 DIAGNOSIS — M545 Low back pain, unspecified: Secondary | ICD-10-CM

## 2020-11-09 DIAGNOSIS — M25612 Stiffness of left shoulder, not elsewhere classified: Secondary | ICD-10-CM | POA: Diagnosis not present
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
## 2020-11-11 ENCOUNTER — Encounter (HOSPITAL_COMMUNITY): Payer: Medicare HMO

## 2020-11-11 ENCOUNTER — Ambulatory Visit (HOSPITAL_COMMUNITY): Payer: Medicare HMO | Admitting: Occupational Therapy

## 2020-11-11 ENCOUNTER — Encounter: Payer: Medicare HMO | Admitting: Physical Medicine and Rehabilitation

## 2020-11-15 ENCOUNTER — Other Ambulatory Visit: Payer: Self-pay

## 2020-11-15 ENCOUNTER — Ambulatory Visit (HOSPITAL_COMMUNITY): Payer: Medicare HMO

## 2020-11-15 ENCOUNTER — Encounter (HOSPITAL_COMMUNITY): Payer: Medicare HMO

## 2020-11-15 ENCOUNTER — Encounter (HOSPITAL_COMMUNITY): Payer: Self-pay

## 2020-11-15 DIAGNOSIS — M25612 Stiffness of left shoulder, not elsewhere classified: Secondary | ICD-10-CM

## 2020-11-15 DIAGNOSIS — M25561 Pain in right knee: Secondary | ICD-10-CM | POA: Diagnosis not present

## 2020-11-15 DIAGNOSIS — M545 Low back pain, unspecified: Secondary | ICD-10-CM | POA: Diagnosis not present

## 2020-11-15 DIAGNOSIS — R2689 Other abnormalities of gait and mobility: Secondary | ICD-10-CM | POA: Diagnosis not present

## 2020-11-15 DIAGNOSIS — R29898 Other symptoms and signs involving the musculoskeletal system: Secondary | ICD-10-CM | POA: Diagnosis not present

## 2020-11-15 DIAGNOSIS — M6281 Muscle weakness (generalized): Secondary | ICD-10-CM | POA: Diagnosis not present

## 2020-11-15 DIAGNOSIS — M25512 Pain in left shoulder: Secondary | ICD-10-CM | POA: Diagnosis not present

## 2020-11-15 DIAGNOSIS — M25562 Pain in left knee: Secondary | ICD-10-CM | POA: Diagnosis not present

## 2020-11-15 NOTE — Therapy (Signed)
[place], [place], [address] Phone: [phone]   Fax:  [phone]  Occupational Therapy Treatment  Patient Details  Name: Kendra [name] MRN: [id number] Date of Birth: 07-27-1945 Referring Provider (OT): Dr. Basil Dess   Encounter Date: 11/15/2020   OT End of Session - 11/15/20 1017    Visit Number 4    Number of Visits 16    Date for OT Re-Evaluation 12/14/20   mini-reassessment on 11/13/20   Authorization Type [employer]    Authorization Time Period 12 visits approved 3/30-11/25/20    Authorization - Visit Number 4    Authorization - Number of Visits 12    Progress Note Due on Visit 10    OT Start Time 0900    OT Stop Time 0945    OT Time Calculation (min) 45 min    Activity Tolerance Patient tolerated treatment well    Behavior During Therapy [facility] for tasks assessed/performed           Past Medical History:  Diagnosis Date  . Arthritis   . Chronic back pain   . Conversion disorder    caused by stress of daughter's death in 11/01/09  . Depression   . GERD (gastroesophageal reflux disease)   . Hypercholesterolemia   . Hypertension   . Kidney stone    stones  . Migraine   . Panic attacks    daily, worse the 15th of each month  . Thyroid disease     Past Surgical History:  Procedure Laterality Date  . ABDOMINAL HYSTERECTOMY     r ovary removal  . APPENDECTOMY    . CHOLECYSTECTOMY    . CYSTOSCOPY    . LEFT HEART CATH AND CORONARY ANGIOGRAPHY N/A 12/26/2017   Procedure: LEFT HEART CATH AND CORONARY ANGIOGRAPHY;  Surgeon: Lorretta Harp, MD;  Location: [facility];  Service: Cardiovascular;  Laterality: N/A;  . THYROID SURGERY      There were no vitals filed for this visit.   Subjective Assessment - 11/15/20 0923    Subjective  S: The massage really feels good.    Patient is accompanied by: Interpreter   Kendra [name]   Currently in Pain? Yes    Pain Score 7     Pain Location Scapula     Pain Orientation Left    Pain Descriptors / Indicators Sore;Aching    Pain Type Chronic pain    Pain Onset Yesterday    Pain Frequency Constant    Aggravating Factors  movement, lifting    Pain Relieving Factors nothing    Effect of Pain on Daily Activities mod effect    Multiple Pain Sites No              [facility] OT Assessment - 11/15/20 0940      Assessment   Medical Diagnosis Left Shoulder Tendonitis      Precautions   Precautions Fall                    OT Treatments/Exercises (OP) - 11/15/20 0933      Exercises   Exercises Shoulder      Shoulder Exercises: Supine   Protraction PROM;5 reps    Horizontal ABduction PROM;5 reps    External Rotation PROM;5 reps    Internal Rotation PROM;5 reps    Flexion PROM;5 reps    ABduction PROM;5 reps      Shoulder Exercises: Standing   Extension Theraband;10 reps  Theraband Level (Shoulder Extension) Level 2 (Red)    Row Theraband;10 reps   palms up   Theraband Level (Shoulder Row) Level 2 (Red)    Retraction Theraband;10 reps      Manual Therapy   Manual Therapy Myofascial release;Other (comment)    Manual therapy comments manual therapy completed seperately from all other interventions this date.    Myofascial Release myofascial release and manual stretching to left upper arm, scapular, and shoulder region to decrease pain and fascial restrictions and improve pain free mobility in her left shoulder region.    Other Manual Therapy Self myofascial release compeleted with assist for placement of tennis ball while standing against wall. Focused on medial border of left scapular                  OT Education - 11/15/20 1015    Education Details Verbal education on how to complete self myofascial release using tennis ball on the wall    Person(s) Educated Patient    Methods Explanation;Demonstration    Comprehension Verbalized understanding;Returned demonstration            OT Short Term Goals -  11/01/20 1136      OT SHORT TERM GOAL #1   Title Patient will be educated and independent with HEP for improved left shoulder mobility.    Time 4    Period Weeks    Status On-going    Target Date 11/16/20      OT SHORT TERM GOAL #2   Title Patient will improve LUE P/ROM to WNL for improved ability to don shirts independently.    Time 4    Period Weeks    Status On-going      OT SHORT TERM GOAL #3   Title Patient will improve left shoulder strength to 4/5 or better for improved independence with carrying shopping bags.    Time 4    Period Weeks    Status On-going      OT SHORT TERM GOAL #4   Title Patient will decrease pain in her left shoulder to 5/10 or better when completing functional tasks.    Time 4    Period Weeks    Status On-going             OT Long Term Goals - 11/01/20 1136      OT LONG TERM GOAL #1   Title Patient will return to PLOF with all desired daily tasks.    Time 8    Period Weeks    Status On-going      OT LONG TERM GOAL #2   Title Patient will improve left shoulder a/rom to [facility] for improved ability to reach overhead and behind her back during ADL completion.    Time 8    Period Weeks    Status On-going      OT LONG TERM GOAL #3   Title Patient will improve left shoulder strength to 5/5 for improved ability to lift grocery bags.    Time 8    Period Weeks    Status On-going      OT LONG TERM GOAL #4   Title Patient will decrease pain in her left shoulder to 3/10 or better when completing daily tasks.    Time 8    Period Weeks    Status On-going                 Plan - 11/15/20 1057    Clinical Impression Statement  A: Used manual techniques and provided education on completing self myofascial release using tennis ball against wall to help decrease fascial restrictions along the median border of the left scapula. Added proximal shoulder strengthening supine with VC for form and technique. Added scapular strengthening exercises using  red band.    Body Structure / Function / Physical Skills ADL;Strength;Pain;IADL;ROM;Fascial restriction;Muscle spasms    Plan P: Continue to work on decreasing fascial restrictions as needed. Continue with scapular strengthening. Resume AA/ROM standing and progress to A/ROM when able.    Consulted and Agree with Plan of Care Patient           Patient will benefit from skilled therapeutic intervention in order to improve the following deficits and impairments:   Body Structure / Function / Physical Skills: ADL,Strength,Pain,IADL,ROM,Fascial restriction,Muscle spasms       Visit Diagnosis: Other symptoms and signs involving the musculoskeletal system  Acute pain of left shoulder  Stiffness of left shoulder, not elsewhere classified    Problem List Patient Active Problem List   Diagnosis Date Noted  . Recurrent major depressive disorder, in partial remission ([place]) 08/29/2020  . History of DVT (deep vein thrombosis) 12/26/2017  . Acute pulmonary embolism without acute cor pulmonale (HCC)   . Chest pain 12/25/2017  . Essential hypertension 12/25/2017  . Mixed hyperlipidemia 12/25/2017  . Chest pain of uncertain etiology   . ABSCESS, TOOTH 09/06/2010  . Morbid obesity ([place]) 07/12/2010  . CHOLECYSTECTOMY, HX OF 04/25/2010  . Depression 03/17/2010  . HYPERTENSION, BENIGN ESSENTIAL 10/28/2009  . ALLERGIC RHINITIS 10/12/2009  . OSA on CPAP 10/12/2009  . HYPERPARATHYROIDISM UNSPECIFIED 07/19/2009  . KNEE PAIN, BILATERAL 01/11/2009  . LABYRINTHITIS, ACUTE 09/29/2008  . BACK PAIN, LUMBAR 08/03/2008  . CALCANEAL SPUR, RIGHT 07/14/2008  . OSTEOPENIA 07/14/2008  . 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 GASTROENTERITIS, ACUTE 01/08/2008  . HEMATURIA UNSPECIFIED 01/08/2008  . TEMPOROMANDIBULAR JOINT PAIN 07/09/2007  . Dyslipidemia 03/07/2007  . GERD 03/07/2007  . OSTEOARTHROSIS, LOCAL, PRIMARY, LOWER LEG 03/07/2007  . MIGRAINE HEADACHE 03/03/2007  . CERVICAL MUSCLE STRAIN 03/03/2007  .  HELICOBACTER PYLORI INFECTION, HX OF 12/21/2005    Ailene Ravel, OTR/L,CBIS  [phone]  11/15/2020, 11:01 AM  [name], [place], [address] Phone: [phone]   Fax:  [phone]  Name: Kendra [name] MRN: [id number] Date of Birth: 09-19-1945

## 2020-11-17 ENCOUNTER — Other Ambulatory Visit: Payer: Self-pay

## 2020-11-17 ENCOUNTER — Encounter (HOSPITAL_COMMUNITY): Payer: Medicare HMO | Admitting: Physical Therapy

## 2020-11-17 ENCOUNTER — Ambulatory Visit (HOSPITAL_COMMUNITY): Payer: Medicare HMO | Admitting: Occupational Therapy

## 2020-11-17 ENCOUNTER — Encounter (HOSPITAL_COMMUNITY): Payer: Self-pay | Admitting: Occupational Therapy

## 2020-11-17 DIAGNOSIS — R29898 Other symptoms and signs involving the musculoskeletal system: Secondary | ICD-10-CM | POA: Diagnosis not present

## 2020-11-17 DIAGNOSIS — M25512 Pain in left shoulder: Secondary | ICD-10-CM

## 2020-11-17 DIAGNOSIS — M25561 Pain in right knee: Secondary | ICD-10-CM | POA: Diagnosis not present

## 2020-11-17 DIAGNOSIS — R2689 Other abnormalities of gait and mobility: Secondary | ICD-10-CM | POA: Diagnosis not present

## 2020-11-17 DIAGNOSIS — M25612 Stiffness of left shoulder, not elsewhere classified: Secondary | ICD-10-CM

## 2020-11-17 DIAGNOSIS — M25562 Pain in left knee: Secondary | ICD-10-CM | POA: Diagnosis not present

## 2020-11-17 DIAGNOSIS — M6281 Muscle weakness (generalized): Secondary | ICD-10-CM | POA: Diagnosis not present

## 2020-11-17 DIAGNOSIS — M545 Low back pain, unspecified: Secondary | ICD-10-CM | POA: Diagnosis not present

## 2020-11-17 NOTE — Patient Instructions (Signed)
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
## 2020-11-17 NOTE — Therapy (Signed)
[place], [place], [address] Phone: [phone]   Fax:  [phone]  Occupational Therapy Treatment  Patient Details  Name: Kendra [name] MRN: [id number] Date of Birth: January 23, 1946 Referring Provider (OT): Dr. Basil Dess   Encounter Date: 11/17/2020   OT End of Session - 11/17/20 0943    Visit Number 5    Number of Visits 16    Date for OT Re-Evaluation 12/14/20    Authorization Type [employer]    Authorization Time Period 12 visits approved 3/30-11/25/20    Authorization - Visit Number 5    Authorization - Number of Visits 12    Progress Note Due on Visit 10    OT Start Time 0902    OT Stop Time 0942    OT Time Calculation (min) 40 min    Activity Tolerance Patient tolerated treatment well    Behavior During Therapy [facility] for tasks assessed/performed           Past Medical History:  Diagnosis Date  . Arthritis   . Chronic back pain   . Conversion disorder    caused by stress of daughter's death in 2009-11-12  . Depression   . GERD (gastroesophageal reflux disease)   . Hypercholesterolemia   . Hypertension   . Kidney stone    stones  . Migraine   . Panic attacks    daily, worse the 15th of each month  . Thyroid disease     Past Surgical History:  Procedure Laterality Date  . ABDOMINAL HYSTERECTOMY     r ovary removal  . APPENDECTOMY    . CHOLECYSTECTOMY    . CYSTOSCOPY    . LEFT HEART CATH AND CORONARY ANGIOGRAPHY N/A 12/26/2017   Procedure: LEFT HEART CATH AND CORONARY ANGIOGRAPHY;  Surgeon: Lorretta Harp, MD;  Location: [facility];  Service: Cardiovascular;  Laterality: N/A;  . THYROID SURGERY      There were no vitals filed for this visit.   Subjective Assessment - 11/17/20 0903    Subjective  S: It's been hurting for a few days.    Patient is accompanied by: Interpreter   Sonia   Currently in Pain? Yes    Pain Score 7     Pain Location Shoulder    Pain Orientation Left    Pain  Descriptors / Indicators Sore    Pain Type Chronic pain    Pain Radiating Towards upper arm    Pain Onset In the past 7 days    Pain Frequency Constant    Aggravating Factors  movement, lifting, sudden movements    Pain Relieving Factors nothing    Effect of Pain on Daily Activities mod effect on ADLs    Multiple Pain Sites No              [facility] OT Assessment - 11/17/20 0903      Assessment   Medical Diagnosis Left Shoulder Tendonitis      Precautions   Precautions Fall      Palpation   Palpation comment moderate fascial restrictions in left shoulder region      AROM   Overall AROM Comments Assessed seated, er/IR adducted    AROM Assessment Site Shoulder    Right/Left Shoulder Left    Left Shoulder Flexion 114 Degrees   90 preivous   Left Shoulder ABduction 105 Degrees   50 previous   Left Shoulder Internal Rotation 90 Degrees   85 previous  Left Shoulder External Rotation 80 Degrees   45 previous     PROM   Overall PROM Comments assessed in supine, er/IR adducted    PROM Assessment Site Shoulder    Right/Left Shoulder Left    Left Shoulder Flexion 150 Degrees   100 previous   Left Shoulder ABduction 180 Degrees   95 previous   Left Shoulder Internal Rotation 90 Degrees   85 previous   Left Shoulder External Rotation 90 Degrees   80 previous     Strength   Overall Strength Comments assessed in seated, er/IR adducted    Strength Assessment Site Shoulder    Right/Left Shoulder Left    Left Shoulder Flexion 4+/5   4-/5 previous   Left Shoulder ABduction 4/5   3+/5 previous   Left Shoulder Internal Rotation 5/5   4+/5 previous   Left Shoulder External Rotation 4+/5   same as previous                   OT Treatments/Exercises (OP) - 11/17/20 0904      Exercises   Exercises Shoulder      Shoulder Exercises: Supine   Protraction PROM;5 reps    Horizontal ABduction PROM;5 reps    External Rotation PROM;5 reps    Internal Rotation PROM;5 reps     Flexion PROM;5 reps    ABduction PROM;5 reps      Shoulder Exercises: Seated   Protraction AROM;10 reps    Horizontal ABduction AROM;10 reps    External Rotation AROM;10 reps    Internal Rotation AROM;10 reps    Flexion AROM;10 reps    Abduction AROM;10 reps      Shoulder Exercises: Standing   Extension Theraband;10 reps    Theraband Level (Shoulder Extension) Level 2 (Red)    Row Theraband;10 reps    Theraband Level (Shoulder Row) Level 2 (Red)    Retraction Theraband;10 reps    Theraband Level (Shoulder Retraction) Level 2 (Red)      Shoulder Exercises: ROM/Strengthening   Proximal Shoulder Strengthening, Seated 10X each, no rest breaks      Manual Therapy   Manual Therapy Myofascial release    Manual therapy comments manual therapy completed seperately from all other interventions this date.    Myofascial Release myofascial release and manual stretching to left upper arm, scapular, and shoulder region to decrease pain and fascial restrictions and improve pain free mobility in her left shoulder region.                  OT Education - 11/17/20 0933    Education Details A/ROM    Person(s) Educated Patient    Methods Explanation;Demonstration;Handout    Comprehension Verbalized understanding;Returned demonstration            OT Short Term Goals - 11/17/20 0922      OT SHORT TERM GOAL #1   Title Patient will be educated and independent with HEP for improved left shoulder mobility.    Time 4    Period Weeks    Status On-going    Target Date 11/16/20      OT SHORT TERM GOAL #2   Title Patient will improve LUE P/ROM to WNL for improved ability to don shirts independently.    Time 4    Period Weeks    Status Achieved      OT SHORT TERM GOAL #3   Title Patient will improve left shoulder strength to 4/5 or better for improved independence  with carrying shopping bags.    Time 4    Period Weeks    Status Achieved      OT SHORT TERM GOAL #4   Title Patient  will decrease pain in her left shoulder to 5/10 or better when completing functional tasks.    Time 4    Period Weeks    Status On-going             OT Long Term Goals - 11/17/20 [id number]      OT LONG TERM GOAL #1   Title Patient will return to PLOF with all desired daily tasks.    Time 8    Period Weeks    Status On-going      OT LONG TERM GOAL #2   Title Patient will improve left shoulder a/rom to [facility] for improved ability to reach overhead and behind her back during ADL completion.    Time 8    Period Weeks    Status On-going      OT LONG TERM GOAL #3   Title Patient will improve left shoulder strength to 5/5 for improved ability to lift grocery bags.    Time 8    Period Weeks    Status On-going      OT LONG TERM GOAL #4   Title Patient will decrease pain in her left shoulder to 3/10 or better when completing daily tasks.    Time 8    Period Weeks    Status On-going                 Plan - 11/17/20 [id number]    Clinical Impression Statement A: Mini-reassessment completed this session, pt has improved her ROM and strength. Continues to have high levels of pain, however reports this is mostly when she lifts something or reaches overhead. Continued with myofascial release, pt reports her husband is going to purchase a tennis ball for home. Progressed to all A/ROM this session, rest breaks provided as needed for dizziness and for fatigue. Continued with scapular theraband. Verbal cuing for form and technique.    Body Structure / Function / Physical Skills ADL;Strength;Pain;IADL;ROM;Fascial restriction;Muscle spasms    Plan P: Continue with myofascial release and follow up on purchase of tennis ball, continue with A/ROM and follow up on HEP. Functional reaching task    OT Home Exercise Plan eval:  table slides; 4/19: AA/ROM; 4/28: A/ROM    Consulted and Agree with Plan of Care Patient           Patient will benefit from skilled therapeutic intervention in order to improve  the following deficits and impairments:   Body Structure / Function / Physical Skills: ADL,Strength,Pain,IADL,ROM,Fascial restriction,Muscle spasms       Visit Diagnosis: Other symptoms and signs involving the musculoskeletal system  Acute pain of left shoulder  Stiffness of left shoulder, not elsewhere classified    Problem List Patient Active Problem List   Diagnosis Date Noted  . Recurrent major depressive disorder, in partial remission ([place]) 08/29/2020  . History of DVT (deep vein thrombosis) 12/26/2017  . Acute pulmonary embolism without acute cor pulmonale ([facility])   . Chest pain 12/25/2017  . Essential hypertension 12/25/2017  . Mixed hyperlipidemia 12/25/2017  . Chest pain of uncertain etiology   . ABSCESS, TOOTH 09/06/2010  . Morbid obesity ([place]) 07/12/2010  . CHOLECYSTECTOMY, HX OF 04/25/2010  . Depression 03/17/2010  . HYPERTENSION, BENIGN ESSENTIAL 10/28/2009  . ALLERGIC RHINITIS 10/12/2009  . OSA on CPAP 10/12/2009  .  HYPERPARATHYROIDISM UNSPECIFIED 07/19/2009  . KNEE PAIN, BILATERAL 01/11/2009  . LABYRINTHITIS, ACUTE 09/29/2008  . BACK PAIN, LUMBAR 08/03/2008  . CALCANEAL SPUR, RIGHT 07/14/2008  . OSTEOPENIA 07/14/2008  . 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 GASTROENTERITIS, ACUTE 01/08/2008  . HEMATURIA UNSPECIFIED 01/08/2008  . TEMPOROMANDIBULAR JOINT PAIN 07/09/2007  . Dyslipidemia 03/07/2007  . GERD 03/07/2007  . OSTEOARTHROSIS, LOCAL, PRIMARY, LOWER LEG 03/07/2007  . MIGRAINE HEADACHE 03/03/2007  . CERVICAL MUSCLE STRAIN 03/03/2007  . HELICOBACTER PYLORI INFECTION, HX OF 12/21/2005   Guadelupe Sabin, OTR/L  [phone] 11/17/2020, 9:44 AM  [facility] [address] [place], [place], [address] Phone: [phone]   Fax:  [phone]  Name: Kendra [name] MRN: [id number] Date of Birth: [birth year]

## 2020-11-18 ENCOUNTER — Ambulatory Visit (HOSPITAL_COMMUNITY): Payer: Medicare HMO

## 2020-11-18 ENCOUNTER — Ambulatory Visit: Payer: Medicare HMO | Admitting: Family Medicine

## 2020-11-18 ENCOUNTER — Ambulatory Visit: Payer: Medicare HMO | Admitting: Cardiology

## 2020-11-18 ENCOUNTER — Other Ambulatory Visit: Payer: Self-pay

## 2020-11-18 DIAGNOSIS — R0602 Shortness of breath: Secondary | ICD-10-CM

## 2020-11-18 DIAGNOSIS — M25512 Pain in left shoulder: Secondary | ICD-10-CM | POA: Diagnosis not present

## 2020-11-18 DIAGNOSIS — R2689 Other abnormalities of gait and mobility: Secondary | ICD-10-CM

## 2020-11-18 DIAGNOSIS — M545 Low back pain, unspecified: Secondary | ICD-10-CM | POA: Diagnosis not present

## 2020-11-18 DIAGNOSIS — R29898 Other symptoms and signs involving the musculoskeletal system: Secondary | ICD-10-CM

## 2020-11-18 DIAGNOSIS — M25562 Pain in left knee: Secondary | ICD-10-CM | POA: Diagnosis not present

## 2020-11-18 DIAGNOSIS — M25561 Pain in right knee: Secondary | ICD-10-CM

## 2020-11-18 DIAGNOSIS — M6281 Muscle weakness (generalized): Secondary | ICD-10-CM | POA: Diagnosis not present

## 2020-11-18 DIAGNOSIS — I10 Essential (primary) hypertension: Secondary | ICD-10-CM

## 2020-11-18 DIAGNOSIS — M25612 Stiffness of left shoulder, not elsewhere classified: Secondary | ICD-10-CM | POA: Diagnosis not present

## 2020-11-18 DIAGNOSIS — E782 Mixed hyperlipidemia: Secondary | ICD-10-CM

## 2020-11-18 DIAGNOSIS — R079 Chest pain, unspecified: Secondary | ICD-10-CM

## 2020-11-18 NOTE — Patient Instructions (Signed)
Access Code: [email or web address] URL: [email or web address] Date: 11/18/2020 Prepared by: Sherlyn Lees  Exercises Standing Anti-Rotation Press with Anchored Resistance - 1 x daily - 7 x weekly - 3 sets - 10 reps - 3 sec hold
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
## 2020-11-22 ENCOUNTER — Ambulatory Visit (HOSPITAL_COMMUNITY): Payer: Medicare HMO | Admitting: Occupational Therapy

## 2020-11-22 ENCOUNTER — Telehealth (HOSPITAL_COMMUNITY): Payer: Self-pay | Admitting: Physical Therapy

## 2020-11-22 ENCOUNTER — Encounter (HOSPITAL_COMMUNITY): Payer: Medicare HMO | Admitting: Physical Therapy

## 2020-11-22 ENCOUNTER — Ambulatory Visit (HOSPITAL_COMMUNITY): Payer: Medicare HMO | Admitting: Physical Therapy

## 2020-11-22 NOTE — Telephone Encounter (Signed)
Pt is still dizzy and will not be here today at all -please cx PT.

## 2020-11-24 ENCOUNTER — Other Ambulatory Visit: Payer: Self-pay

## 2020-11-24 ENCOUNTER — Ambulatory Visit (HOSPITAL_COMMUNITY): Payer: Medicare HMO | Admitting: Physical Therapy

## 2020-11-24 ENCOUNTER — Encounter (HOSPITAL_COMMUNITY): Payer: Medicare HMO | Admitting: Physical Therapy

## 2020-11-24 ENCOUNTER — Ambulatory Visit (HOSPITAL_COMMUNITY): Payer: Medicare HMO | Attending: Specialist | Admitting: Occupational Therapy

## 2020-11-24 DIAGNOSIS — M6281 Muscle weakness (generalized): Secondary | ICD-10-CM | POA: Diagnosis not present

## 2020-11-24 DIAGNOSIS — M25561 Pain in right knee: Secondary | ICD-10-CM | POA: Insufficient documentation

## 2020-11-24 DIAGNOSIS — M545 Low back pain, unspecified: Secondary | ICD-10-CM | POA: Diagnosis not present

## 2020-11-24 DIAGNOSIS — M25512 Pain in left shoulder: Secondary | ICD-10-CM | POA: Insufficient documentation

## 2020-11-24 DIAGNOSIS — R29898 Other symptoms and signs involving the musculoskeletal system: Secondary | ICD-10-CM | POA: Diagnosis not present

## 2020-11-24 DIAGNOSIS — M25562 Pain in left knee: Secondary | ICD-10-CM | POA: Diagnosis not present

## 2020-11-24 DIAGNOSIS — M25612 Stiffness of left shoulder, not elsewhere classified: Secondary | ICD-10-CM | POA: Diagnosis not present
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
## 2020-11-28 ENCOUNTER — Ambulatory Visit: Payer: Medicare HMO | Admitting: Family Medicine

## 2020-11-29 ENCOUNTER — Ambulatory Visit (HOSPITAL_COMMUNITY): Payer: Medicare HMO

## 2020-11-29 ENCOUNTER — Encounter (HOSPITAL_COMMUNITY): Payer: Self-pay

## 2020-11-29 ENCOUNTER — Other Ambulatory Visit: Payer: Self-pay

## 2020-11-29 ENCOUNTER — Encounter (HOSPITAL_COMMUNITY): Payer: Medicare HMO | Admitting: Physical Therapy

## 2020-11-29 DIAGNOSIS — M25612 Stiffness of left shoulder, not elsewhere classified: Secondary | ICD-10-CM

## 2020-11-29 DIAGNOSIS — M25562 Pain in left knee: Secondary | ICD-10-CM | POA: Diagnosis not present

## 2020-11-29 DIAGNOSIS — M6281 Muscle weakness (generalized): Secondary | ICD-10-CM | POA: Diagnosis not present

## 2020-11-29 DIAGNOSIS — M545 Low back pain, unspecified: Secondary | ICD-10-CM | POA: Diagnosis not present

## 2020-11-29 DIAGNOSIS — M25561 Pain in right knee: Secondary | ICD-10-CM | POA: Diagnosis not present

## 2020-11-29 DIAGNOSIS — M25512 Pain in left shoulder: Secondary | ICD-10-CM | POA: Diagnosis not present

## 2020-11-29 DIAGNOSIS — R29898 Other symptoms and signs involving the musculoskeletal system: Secondary | ICD-10-CM

## 2020-11-29 NOTE — Therapy (Signed)
[place], [place], [address] Phone: [phone]   Fax:  [phone]  Occupational Therapy Treatment  Patient Details  Name: Kendra [name] MRN: [id number] Date of Birth: 03-19-1946 Referring Provider (OT): Dr. Basil Dess   Encounter Date: 11/29/2020   OT End of Session - 11/29/20 1121    Visit Number 6    Number of Visits 16    Date for OT Re-Evaluation 12/14/20    Authorization Type [employer]    Authorization Time Period 12 visits approved 3/30-11/25/20 (Session not covered by [facility] as timeframe has lapsed.)  Sent in new request for additional visits.    Authorization - Visit Number --    Authorization - Number of Visits 12    Progress Note Due on Visit 10    OT Start Time 1448   Due to no interpreter present then technical difficulties using video interpreter, skilled portion of session did not begin until 10:57AM.   OT Stop Time 1125    OT Time Calculation (min) 45 min    Activity Tolerance Patient tolerated treatment well    Behavior During Therapy [facility] for tasks assessed/performed           Past Medical History:  Diagnosis Date  . Arthritis   . Chronic back pain   . Conversion disorder    caused by stress of daughter's death in Nov 09, 2009  . Depression   . GERD (gastroesophageal reflux disease)   . Hypercholesterolemia   . Hypertension   . Kidney stone    stones  . Migraine   . Panic attacks    daily, worse the 15th of each month  . Thyroid disease     Past Surgical History:  Procedure Laterality Date  . ABDOMINAL HYSTERECTOMY     r ovary removal  . APPENDECTOMY    . CHOLECYSTECTOMY    . CYSTOSCOPY    . LEFT HEART CATH AND CORONARY ANGIOGRAPHY N/A 12/26/2017   Procedure: LEFT HEART CATH AND CORONARY ANGIOGRAPHY;  Surgeon: Lorretta Harp, MD;  Location: [facility];  Service: Cardiovascular;  Laterality: N/A;  . THYROID SURGERY      There were no vitals filed for this visit.    Subjective Assessment - 11/29/20 1117    Subjective  S: This weekend my shoulder started to hurt more.    Patient is accompanied by: --   No interpreter present. Attempted to use video interpreter although unable. Application was logged out. IT was called and ticket was placed. Used phone interpreter Benay Spice [phone]). She assisted until phone call was dropped at last 15 minutes of session.   Currently in Pain? Yes    Pain Score 5     Pain Location Shoulder    Pain Orientation Left;Anterior    Pain Descriptors / Indicators Aching;Constant;Sore    Pain Type Acute pain    Pain Onset Other (comment)   in the past 2 days   Pain Frequency Constant    Aggravating Factors  Unknown what caused it. Hurts with shoulder flexion and abduction and horizontal abduction.    Pain Relieving Factors Attempted use of ES and moist heat during session.    Effect of Pain on Daily Activities Severe effect    Multiple Pain Sites No              [facility] OT Assessment - 11/29/20 1120      Assessment   Medical Diagnosis Left shoulder tendonitis  Precautions   Precautions Fall                    OT Treatments/Exercises (OP) - 11/29/20 1120      Exercises   Exercises Shoulder      Shoulder Exercises: Supine   Protraction PROM;5 reps    Horizontal ABduction PROM;5 reps    External Rotation PROM;5 reps    Internal Rotation PROM;5 reps    Flexion PROM;5 reps    ABduction PROM;5 reps      Modalities   Modalities Electrical Stimulation;Moist Heat      Moist Heat Therapy   Number Minutes Moist Heat 10 Minutes    Moist Heat Location Shoulder      Electrical Stimulation   Electrical Stimulation Location left anterior shoulder region    Electrical Stimulation Action interferential    Electrical Stimulation Parameters 16.5 CV    Electrical Stimulation Goals Pain      Manual Therapy   Manual Therapy Myofascial release    Manual therapy comments manual therapy completed seperately from all  other interventions this date.    Myofascial Release myofascial release and manual stretching to left upper arm, scapular, and shoulder region to decrease pain and fascial restrictions and improve pain free mobility in her left shoulder region.                    OT Short Term Goals - 11/17/20 2706      OT SHORT TERM GOAL #1   Title Patient will be educated and independent with HEP for improved left shoulder mobility.    Time 4    Period Weeks    Status On-going    Target Date 11/16/20      OT SHORT TERM GOAL #2   Title Patient will improve LUE P/ROM to WNL for improved ability to don shirts independently.    Time 4    Period Weeks    Status Achieved      OT SHORT TERM GOAL #3   Title Patient will improve left shoulder strength to 4/5 or better for improved independence with carrying shopping bags.    Time 4    Period Weeks    Status Achieved      OT SHORT TERM GOAL #4   Title Patient will decrease pain in her left shoulder to 5/10 or better when completing functional tasks.    Time 4    Period Weeks    Status On-going             OT Long Term Goals - 11/17/20 2376      OT LONG TERM GOAL #1   Title Patient will return to PLOF with all desired daily tasks.    Time 8    Period Weeks    Status On-going      OT LONG TERM GOAL #2   Title Patient will improve left shoulder a/rom to [facility] for improved ability to reach overhead and behind her back during ADL completion.    Time 8    Period Weeks    Status On-going      OT LONG TERM GOAL #3   Title Patient will improve left shoulder strength to 5/5 for improved ability to lift grocery bags.    Time 8    Period Weeks    Status On-going      OT LONG TERM GOAL #4   Title Patient will decrease pain in her left shoulder to 3/10 or  better when completing daily tasks.    Time 8    Period Weeks    Status On-going                 Plan - 11/29/20 1123    Clinical Impression Statement A: Pt arrived with a  high level of pain in the left anterior shoulder region as well as the upper trapezius region proximal to neck. Manual techniques were completed to address max fascial restrictions in the left UE. Pt unable to recall if she had done anything different at home to cause this increased pain. Session focused on pain management techniques. ES and moist heat used at end of session with patient reporting a pain score of 3/10 at the end.    Body Structure / Function / Physical Skills ADL;Strength;Pain;IADL;ROM;Fascial restriction;Muscle spasms    Plan P: Follow up on pain. Resume A/ROM. Complete functional reaching task.    Consulted and Agree with Plan of Care Patient           Patient will benefit from skilled therapeutic intervention in order to improve the following deficits and impairments:   Body Structure / Function / Physical Skills: ADL,Strength,Pain,IADL,ROM,Fascial restriction,Muscle spasms       Visit Diagnosis: Other symptoms and signs involving the musculoskeletal system  Acute pain of left shoulder  Stiffness of left shoulder, not elsewhere classified    Problem List Patient Active Problem List   Diagnosis Date Noted  . Recurrent major depressive disorder, in partial remission ([place]) 08/29/2020  . History of DVT (deep vein thrombosis) 12/26/2017  . Acute pulmonary embolism without acute cor pulmonale (HCC)   . Chest pain 12/25/2017  . Essential hypertension 12/25/2017  . Mixed hyperlipidemia 12/25/2017  . Chest pain of uncertain etiology   . ABSCESS, TOOTH 09/06/2010  . Morbid obesity ([place]) 07/12/2010  . CHOLECYSTECTOMY, HX OF 04/25/2010  . Depression 03/17/2010  . HYPERTENSION, BENIGN ESSENTIAL 10/28/2009  . ALLERGIC RHINITIS 10/12/2009  . OSA on CPAP 10/12/2009  . HYPERPARATHYROIDISM UNSPECIFIED 07/19/2009  . KNEE PAIN, BILATERAL 01/11/2009  . LABYRINTHITIS, ACUTE 09/29/2008  . BACK PAIN, LUMBAR 08/03/2008  . CALCANEAL SPUR, RIGHT 07/14/2008  . OSTEOPENIA  07/14/2008  . 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 GASTROENTERITIS, ACUTE 01/08/2008  . HEMATURIA UNSPECIFIED 01/08/2008  . TEMPOROMANDIBULAR JOINT PAIN 07/09/2007  . Dyslipidemia 03/07/2007  . GERD 03/07/2007  . OSTEOARTHROSIS, LOCAL, PRIMARY, LOWER LEG 03/07/2007  . MIGRAINE HEADACHE 03/03/2007  . CERVICAL MUSCLE STRAIN 03/03/2007  . HELICOBACTER PYLORI INFECTION, HX OF 12/21/2005    Ailene Ravel, OTR/L,CBIS  [phone]  11/29/2020, 4:31 PM  [place] [address] [place], [place], [address] Phone: [phone]   Fax:  [phone]  Name: Kendra [name] MRN: [id number] Date of Birth: Sep 21, 1945
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
## 2020-11-30 ENCOUNTER — Ambulatory Visit
Admission: RE | Admit: 2020-11-30 | Discharge: 2020-11-30 | Disposition: A | Discharge status: Home / Self Care | Payer: Medicare HMO | Source: Ambulatory Visit | Attending: Family Medicine | Admitting: Family Medicine

## 2020-11-30 ENCOUNTER — Ambulatory Visit (HOSPITAL_COMMUNITY)
Admission: RE | Admit: 2020-11-30 | Discharge: 2020-11-30 | Disposition: A | Discharge status: Home / Self Care | Payer: Medicare HMO | Source: Ambulatory Visit | Attending: Family Medicine | Admitting: Family Medicine

## 2020-11-30 ENCOUNTER — Other Ambulatory Visit: Payer: Self-pay

## 2020-11-30 DIAGNOSIS — R0602 Shortness of breath: Secondary | ICD-10-CM | POA: Insufficient documentation

## 2020-11-30 DIAGNOSIS — Z1231 Encounter for screening mammogram for malignant neoplasm of breast: Secondary | ICD-10-CM | POA: Diagnosis not present

## 2020-11-30 MED ORDER — PERFLUTREN LIPID MICROSPHERE
1.0000 mL | INTRAVENOUS | Status: AC | PRN
  Administered 2020-11-30: 2 mL via INTRAVENOUS
  Filled 2020-11-30: qty 10

## 2020-11-30 NOTE — Progress Notes (Signed)
2D echo limited with definity.    Leavy Cella, RDCS

## 2020-12-01 ENCOUNTER — Telehealth: Payer: Self-pay | Admitting: *Deleted

## 2020-12-01 ENCOUNTER — Ambulatory Visit (HOSPITAL_COMMUNITY): Payer: Medicare HMO

## 2020-12-01 ENCOUNTER — Encounter (HOSPITAL_COMMUNITY): Payer: Medicare HMO | Admitting: Physical Therapy

## 2020-12-01 NOTE — Telephone Encounter (Signed)
Spoke to [employer], patient's PCP Huston Foley Just FNP is no longer with [facility]. Patient does not have a PCP or appointment.

## 2020-12-06 ENCOUNTER — Encounter (HOSPITAL_COMMUNITY): Payer: Self-pay | Admitting: Physical Therapy

## 2020-12-06 ENCOUNTER — Ambulatory Visit (HOSPITAL_COMMUNITY): Payer: Medicare HMO | Admitting: Physical Therapy

## 2020-12-06 ENCOUNTER — Other Ambulatory Visit: Payer: Self-pay

## 2020-12-06 DIAGNOSIS — M545 Low back pain, unspecified: Secondary | ICD-10-CM

## 2020-12-06 DIAGNOSIS — M25561 Pain in right knee: Secondary | ICD-10-CM

## 2020-12-06 DIAGNOSIS — R29898 Other symptoms and signs involving the musculoskeletal system: Secondary | ICD-10-CM

## 2020-12-06 DIAGNOSIS — M6281 Muscle weakness (generalized): Secondary | ICD-10-CM

## 2020-12-06 DIAGNOSIS — M25512 Pain in left shoulder: Secondary | ICD-10-CM | POA: Diagnosis not present

## 2020-12-06 DIAGNOSIS — M25612 Stiffness of left shoulder, not elsewhere classified: Secondary | ICD-10-CM | POA: Diagnosis not present

## 2020-12-06 DIAGNOSIS — M25562 Pain in left knee: Secondary | ICD-10-CM
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
## 2020-12-08 ENCOUNTER — Ambulatory Visit (HOSPITAL_COMMUNITY): Payer: Medicare HMO

## 2020-12-08 ENCOUNTER — Encounter (HOSPITAL_COMMUNITY): Payer: Self-pay

## 2020-12-08 ENCOUNTER — Telehealth: Payer: Self-pay | Admitting: *Deleted

## 2020-12-08 ENCOUNTER — Telehealth (HOSPITAL_COMMUNITY): Payer: Self-pay

## 2020-12-08 NOTE — Telephone Encounter (Signed)
[facility] thought no show, talked to husband who stated they called earlier to cancel apt today due to increased LBP.  Reminded next OT and PT apt.  Requested they make further apts due to only 1 PT apt remaining on schedule.  Ihor Austin, LPTA/CLT; Delana Meyer [phone]

## 2020-12-08 NOTE — Telephone Encounter (Signed)
Laurine Blazer, LPN  9/37/3428 7:68 AM EDT Back to Top     Husband Felicita Gage) notified

## 2020-12-08 NOTE — Telephone Encounter (Signed)
-----   Message from Verta Ellen., NP sent at 11/30/2020  3:18 PM EDT ----- Please call the patient and let her know the echocardiogram showed she has good pumping function of the heart.  This was a limited study due to the previous echocardiogram .  Pumping function is normal.  She has no wall motion abnormalities to suggest any decreased blood flow to the heart muscle.  She will need a Spanish interpreter when you call to convey this information.  Thank you

## 2020-12-13 ENCOUNTER — Ambulatory Visit (HOSPITAL_COMMUNITY): Payer: Medicare HMO

## 2020-12-14 ENCOUNTER — Encounter (HOSPITAL_COMMUNITY): Payer: Self-pay | Admitting: Physical Therapy

## 2020-12-14 ENCOUNTER — Ambulatory Visit (HOSPITAL_COMMUNITY): Payer: Medicare HMO | Admitting: Physical Therapy

## 2020-12-14 ENCOUNTER — Other Ambulatory Visit: Payer: Self-pay

## 2020-12-14 DIAGNOSIS — M25612 Stiffness of left shoulder, not elsewhere classified: Secondary | ICD-10-CM | POA: Diagnosis not present

## 2020-12-14 DIAGNOSIS — M25562 Pain in left knee: Secondary | ICD-10-CM | POA: Diagnosis not present

## 2020-12-14 DIAGNOSIS — M6281 Muscle weakness (generalized): Secondary | ICD-10-CM

## 2020-12-14 DIAGNOSIS — R29898 Other symptoms and signs involving the musculoskeletal system: Secondary | ICD-10-CM

## 2020-12-14 DIAGNOSIS — M25512 Pain in left shoulder: Secondary | ICD-10-CM | POA: Diagnosis not present

## 2020-12-14 DIAGNOSIS — M25561 Pain in right knee: Secondary | ICD-10-CM | POA: Diagnosis not present

## 2020-12-14 DIAGNOSIS — M545 Low back pain, unspecified: Secondary | ICD-10-CM
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
## 2020-12-15 ENCOUNTER — Ambulatory Visit (HOSPITAL_COMMUNITY): Payer: Medicare HMO

## 2020-12-15 ENCOUNTER — Telehealth (HOSPITAL_COMMUNITY): Payer: Self-pay

## 2020-12-15 NOTE — Telephone Encounter (Signed)
She is sick and will not be here today

## 2020-12-20 ENCOUNTER — Encounter (HOSPITAL_COMMUNITY): Payer: Medicare HMO

## 2020-12-21 ENCOUNTER — Other Ambulatory Visit: Payer: Self-pay

## 2020-12-21 ENCOUNTER — Ambulatory Visit (HOSPITAL_COMMUNITY): Payer: Medicare HMO | Attending: Specialist

## 2020-12-21 ENCOUNTER — Encounter (HOSPITAL_COMMUNITY): Payer: Self-pay

## 2020-12-21 DIAGNOSIS — M25612 Stiffness of left shoulder, not elsewhere classified: Secondary | ICD-10-CM | POA: Diagnosis not present

## 2020-12-21 DIAGNOSIS — M25512 Pain in left shoulder: Secondary | ICD-10-CM | POA: Diagnosis not present

## 2020-12-21 DIAGNOSIS — R29898 Other symptoms and signs involving the musculoskeletal system: Secondary | ICD-10-CM | POA: Diagnosis not present

## 2020-12-21 NOTE — Patient Instructions (Signed)
Complete the following exercises 1-3 times a day.  Doorway Stretch  Place each hand opposite each other on the doorway. (You can change where you feel the stretch by moving arms higher or lower.) Step through with one foot and bend front knee until a stretch is felt and hold. Step through with the opposite foot on the next rep. Hold for __20-30___ seconds. Repeat __2__times.         Internal Rotation Across Back  Grab the end of a towel with your affected side, palm facing backwards. Grab the towel with your unaffected side and pull your affected hand across your back until you feel a stretch in the front of your shoulder. If you feel pain, pull just to the pain, do not pull through the pain. Hold. Return your affected arm to your side. Try to keep your hand/arm close to your body during the entire movement.     Hold for 20-30 seconds. Complete 2 times.           Wall Flexion  Slide your arm up the wall or door frame until a stretch is felt in your shoulder . Hold for 10-15 seconds. Complete 2 times     Shoulder Abduction Stretch  Stand side ways by a wall with affected up on wall. Gently step in toward wall to feel stretch. Hold for 20-30 seconds. Complete 2 times.

## 2020-12-21 NOTE — Therapy (Signed)
[place], [place], [address] Phone: [phone]   Fax:  [phone]  Occupational Therapy Treatment Reassessment and re-cert Patient Details  Name: Kendra [name] MRN: [id number] Date of Birth: 1946/06/09 Referring Provider (OT): Dr. Basil Dess  Progress Note Reporting Period 11/17/20 to 12/21/20  See note below for Objective Data and Assessment of Progress/Goals.       Encounter Date: 12/21/2020   OT End of Session - 12/21/20 1455    Visit Number 7    Number of Visits 16    Date for OT Re-Evaluation 01/03/21    Authorization Type [employer]    Authorization Time Period 11 visits approved 5/10-6/14    Authorization - Visit Number 1    Authorization - Number of Visits 11    Progress Note Due on Visit 10    OT Start Time 1300   reassessment/re-cert   OT Stop Time 5208    OT Time Calculation (min) 40 min    Activity Tolerance Patient tolerated treatment well    Behavior During Therapy WFL for tasks assessed/performed           Past Medical History:  Diagnosis Date  . Arthritis   . Chronic back pain   . Conversion disorder    caused by stress of daughter's death in Oct 08, 2009  . Depression   . GERD (gastroesophageal reflux disease)   . Hypercholesterolemia   . Hypertension   . Kidney stone    stones  . Migraine   . Panic attacks    daily, worse the 15th of each month  . Thyroid disease     Past Surgical History:  Procedure Laterality Date  . ABDOMINAL HYSTERECTOMY     r ovary removal  . APPENDECTOMY    . CHOLECYSTECTOMY    . CYSTOSCOPY    . LEFT HEART CATH AND CORONARY ANGIOGRAPHY N/A 12/26/2017   Procedure: LEFT HEART CATH AND CORONARY ANGIOGRAPHY;  Surgeon: Lorretta Harp, MD;  Location: [facility];  Service: Cardiovascular;  Laterality: N/A;  . THYROID SURGERY      There were no vitals filed for this visit.   Subjective Assessment - 12/21/20 1452    Subjective  S: I'm able to  stitch now which I couldn't do before.    Patient is accompanied by: Interpreter    Currently in Pain? No/denies              [facility] OT Assessment - 12/21/20 1308      Assessment   Medical Diagnosis Left shoulder tendonitis    Referring Provider (OT) Dr. Basil Dess    Onset Date/Surgical Date --   several months ago     Precautions   Precautions Fall      Prior Function   Level of Independence Independent      ROM / Strength   AROM / PROM / Strength AROM;PROM;Strength      AROM   Overall AROM Comments Assessed seated, er/IR adducted    AROM Assessment Site Shoulder    Right/Left Shoulder Left    Left Shoulder Flexion 123 Degrees   previous: 114   Left Shoulder ABduction 100 Degrees   previous: 105   Left Shoulder Internal Rotation 90 Degrees   previous: same   Left Shoulder External Rotation 80 Degrees   previous: same     PROM   Overall PROM Comments assessed in supine, er/IR adducted    PROM Assessment Site Shoulder  Right/Left Shoulder Left    Left Shoulder Flexion 152 Degrees   previous: 150   Left Shoulder ABduction 180 Degrees   previous: same   Left Shoulder Internal Rotation 90 Degrees   previous: same   Left Shoulder External Rotation 90 Degrees   previous: same     Strength   Overall Strength Comments assessed in seated, er/IR adducted    Strength Assessment Site Shoulder    Right/Left Shoulder Left    Left Shoulder Flexion 5/5   previous: 4+/5   Left Shoulder ABduction 5/5   previous: 4/5   Left Shoulder Internal Rotation 5/5   previous: same   Left Shoulder External Rotation 5/5   previous: 4+/5                   OT Treatments/Exercises (OP) - 12/21/20 1323      Exercises   Exercises Shoulder      Shoulder Exercises: Stretch   Corner Stretch 1 rep;20 seconds   doorway   Internal Rotation Stretch 1 rep   20", towel horizontal   Wall Stretch - Flexion 1 rep;20 seconds    Wall Stretch - ABduction 1 rep;20 seconds                   OT Education - 12/21/20 1451    Education Details reviewed HEP. Stop table slides and AA/ROM. Continue with A/ROM standing. Start shoulder stretches: flexion, abduction, doorway, and internal rotation    Person(s) Educated Patient    Methods Explanation;Demonstration;Handout;Tactile cues;Verbal cues    Comprehension Verbalized understanding;Returned demonstration            OT Short Term Goals - 12/21/20 1320      OT SHORT TERM GOAL #1   Title Patient will be educated and independent with HEP for improved left shoulder mobility.    Time 4    Period Weeks    Status Achieved    Target Date 11/16/20      OT SHORT TERM GOAL #2   Title Patient will improve LUE P/ROM to WNL for improved ability to don shirts independently.    Time 4    Period Weeks      OT SHORT TERM GOAL #3   Title Patient will improve left shoulder strength to 4/5 or better for improved independence with carrying shopping bags.    Time 4    Period Weeks      OT SHORT TERM GOAL #4   Title Patient will decrease pain in her left shoulder to 5/10 or better when completing functional tasks.    Time 4    Period Weeks    Status Achieved             OT Long Term Goals - 12/21/20 1320      OT LONG TERM GOAL #1   Title Patient will return to PLOF with all desired daily tasks.    Time 8    Period Weeks    Status On-going      OT LONG TERM GOAL #2   Title Patient will improve left shoulder a/rom to [facility] for improved ability to reach overhead and behind her back during ADL completion.    Time 8    Period Weeks    Status On-going      OT LONG TERM GOAL #3   Title Patient will improve left shoulder strength to 5/5 for improved ability to lift grocery bags.    Time 8  Period Weeks    Status Achieved      OT LONG TERM GOAL #4   Title Patient will decrease pain in her left shoulder to 3/10 or better when completing daily tasks.    Time 8    Period Weeks    Status On-going                  Plan - 12/21/20 1459    Clinical Impression Statement A: Patient has not been seen in clinic since 3 weeks due to pain in lower extremities. Reassessment completed this date. Patient has met all short term goals and 1/4 LTGs. 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 She is still limited with her A/ROM in LUE although it is nearly functional. Strength is 5/5 in all shoulder ranges. She completed shoulder stretches and HEP was updated. Recommend that patient continue with 3 more appointments to focus on A/ROM and functional reaching ability of the left arm prior to discharge with HEP.    Body Structure / Function / Physical Skills ADL;Strength;Pain;IADL;ROM;Fascial restriction;Muscle spasms    OT Frequency --   2X a week for one week then 1X a week the following week.   OT Duration Other (comment)   see above   OT Treatment/Interventions Self-care/ADL training;Moist Heat;DME and/or AE instruction;Therapeutic activities;Ultrasound;Therapeutic exercise;Passive range of motion;Neuromuscular education;Iontophoresis;Cryotherapy;Electrical Stimulation;Energy conservation;Manual Therapy;Patient/family education    Plan P: Continue skilled OT services focusing on A/ROM and functional use of  LUE. Next session: D/C passive stretching. Complete myofascial release only if needed. Complete scapular strengthening and UBE bike. Functional reaching tasks.    OT Home Exercise Plan eval:  table slides; 4/19: AA/ROM; 4/28: A/ROM 6/1: shoulder stretches    Consulted and Agree with Plan of Care Patient           Patient will benefit from skilled therapeutic intervention in order to improve the following deficits and impairments:   Body Structure / Function / Physical Skills: ADL,Strength,Pain,IADL,ROM,Fascial restriction,Muscle spasms       Visit Diagnosis: Acute pain of left shoulder - Plan: Ot plan of care  cert/re-cert  Stiffness of left shoulder, not elsewhere classified - Plan: Ot plan of care cert/re-cert  Other symptoms and signs involving the musculoskeletal system - Plan: Ot plan of care cert/re-cert    Problem List Patient Active Problem List   Diagnosis Date Noted  . Recurrent major depressive disorder, in partial remission ([place]) 08/29/2020  . History of DVT (deep vein thrombosis) 12/26/2017  . Acute pulmonary embolism without acute cor pulmonale ([facility])   . Chest pain 12/25/2017  . Essential hypertension 12/25/2017  . Mixed hyperlipidemia 12/25/2017  . Chest pain of uncertain etiology   . ABSCESS, TOOTH 09/06/2010  . Morbid obesity ([place]) 07/12/2010  . CHOLECYSTECTOMY, HX OF 04/25/2010  . Depression 03/17/2010  . HYPERTENSION, BENIGN ESSENTIAL 10/28/2009  . ALLERGIC RHINITIS 10/12/2009  . OSA on CPAP 10/12/2009  . HYPERPARATHYROIDISM UNSPECIFIED 07/19/2009  . KNEE PAIN, BILATERAL 01/11/2009  . LABYRINTHITIS, ACUTE 09/29/2008  . BACK PAIN, LUMBAR 08/03/2008  . CALCANEAL SPUR, RIGHT 07/14/2008  . OSTEOPENIA 07/14/2008  . 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 GASTROENTERITIS, ACUTE 01/08/2008  . HEMATURIA UNSPECIFIED 01/08/2008  . TEMPOROMANDIBULAR JOINT PAIN 07/09/2007  . Dyslipidemia 03/07/2007  . GERD 03/07/2007  . OSTEOARTHROSIS, LOCAL, PRIMARY, LOWER LEG 03/07/2007  . MIGRAINE HEADACHE 03/03/2007  . CERVICAL MUSCLE STRAIN 03/03/2007  . HELICOBACTER PYLORI INFECTION, HX OF 12/21/2005  Ailene Ravel, OTR/L,CBIS  [phone]  12/21/2020, 3:40 PM  [place] [address] [place], [place], [address] Phone: [phone]   Fax:  [phone]  Name: Kendra [name] MRN: [id number] Date of Birth: Jan 16, 1946
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
## 2020-12-26 ENCOUNTER — Encounter: Payer: Self-pay | Admitting: Family Medicine

## 2020-12-26 ENCOUNTER — Ambulatory Visit (INDEPENDENT_AMBULATORY_CARE_PROVIDER_SITE_OTHER): Payer: Medicare HMO | Admitting: Family Medicine

## 2020-12-26 VITALS — BP 114/72 | HR 93 | Ht 63.0 in | Wt 215.8 lb

## 2020-12-26 DIAGNOSIS — R002 Palpitations: Secondary | ICD-10-CM | POA: Diagnosis not present

## 2020-12-26 DIAGNOSIS — E782 Mixed hyperlipidemia: Secondary | ICD-10-CM | POA: Diagnosis not present

## 2020-12-26 DIAGNOSIS — I10 Essential (primary) hypertension: Secondary | ICD-10-CM | POA: Diagnosis not present

## 2020-12-26 DIAGNOSIS — R079 Chest pain, unspecified: Secondary | ICD-10-CM | POA: Diagnosis not present

## 2020-12-26 DIAGNOSIS — R0602 Shortness of breath: Secondary | ICD-10-CM

## 2020-12-26 NOTE — Patient Instructions (Signed)
Medication Instructions:  Continue all current medications.   Labwork: none  Testing/Procedures: none  Follow-Up: 6 months   Any Other Special Instructions Will Be Listed Below (If Applicable).   If you need a refill on your cardiac medications before your next appointment, please call your pharmacy.  

## 2020-12-28 ENCOUNTER — Encounter (HOSPITAL_COMMUNITY): Payer: Medicare HMO

## 2020-12-30 ENCOUNTER — Ambulatory Visit (HOSPITAL_COMMUNITY): Payer: Medicare HMO | Admitting: Occupational Therapy

## 2020-12-30 ENCOUNTER — Other Ambulatory Visit: Payer: Self-pay

## 2020-12-30 ENCOUNTER — Encounter (HOSPITAL_COMMUNITY): Payer: Self-pay | Admitting: Occupational Therapy

## 2020-12-30 DIAGNOSIS — M25512 Pain in left shoulder: Secondary | ICD-10-CM | POA: Diagnosis not present

## 2020-12-30 DIAGNOSIS — M25612 Stiffness of left shoulder, not elsewhere classified: Secondary | ICD-10-CM

## 2020-12-30 DIAGNOSIS — R29898 Other symptoms and signs involving the musculoskeletal system: Secondary | ICD-10-CM | POA: Diagnosis not present

## 2020-12-30 NOTE — Therapy (Signed)
[place], [place], [address] Phone: [phone]   Fax:  [phone]  Occupational Therapy Treatment  Patient Details  Name: Kendra [name] MRN: [id number] Date of Birth: 02-14-1946 Referring Provider (OT): Dr. Basil Dess   Encounter Date: 12/30/2020   OT End of Session - 12/30/20 1518     Visit Number 8    Number of Visits 16    Date for OT Re-Evaluation 01/03/21    Authorization Type [employer]    Authorization Time Period 11 visits approved 5/10-6/14    Authorization - Visit Number 2    Authorization - Number of Visits 11    Progress Note Due on Visit 10    OT Start Time 1437/10/16    OT Stop Time 0998    OT Time Calculation (min) 38 min    Activity Tolerance Patient tolerated treatment well    Behavior During Therapy [facility] for tasks assessed/performed             Past Medical History:  Diagnosis Date   Arthritis    Chronic back pain    Conversion disorder    caused by stress of daughter's death in 10/16/09   Depression    GERD (gastroesophageal reflux disease)    Hypercholesterolemia    Hypertension    Kidney stone    stones   Migraine    Panic attacks    daily, worse the 15th of each month   Thyroid disease     Past Surgical History:  Procedure Laterality Date   ABDOMINAL HYSTERECTOMY     r ovary removal   APPENDECTOMY     CHOLECYSTECTOMY     CYSTOSCOPY     LEFT HEART CATH AND CORONARY ANGIOGRAPHY N/A 12/26/2017   Procedure: LEFT HEART CATH AND CORONARY ANGIOGRAPHY;  Surgeon: Lorretta Harp, MD;  Location: [facility];  Service: Cardiovascular;  Laterality: N/A;   THYROID SURGERY      There were no vitals filed for this visit.   Subjective Assessment - 12/30/20 1440     Subjective  S: I worked in my garden today.    Currently in Pain? No/denies                [facility] OT Assessment - 12/30/20 1440       Assessment   Medical Diagnosis Left shoulder tendonitis       Precautions   Precautions Fall                      OT Treatments/Exercises (OP) - 12/30/20 1440       Exercises   Exercises Shoulder      Shoulder Exercises: Seated   Protraction AROM;10 reps    Horizontal ABduction AROM;10 reps    External Rotation AROM;10 reps    Internal Rotation AROM;10 reps    Flexion AROM;10 reps    Abduction AROM;10 reps      Shoulder Exercises: Standing   Extension Theraband;10 reps    Theraband Level (Shoulder Extension) Level 2 (Red)    Row Theraband;10 reps    Theraband Level (Shoulder Row) Level 2 (Red)    Retraction Theraband;10 reps    Theraband Level (Shoulder Retraction) Level 2 (Red)      Shoulder Exercises: ROM/Strengthening   UBE (Upper Arm Bike) Level 1' 3' forward 3' reverse, pace: 4.5    X to V Arms 10X    Proximal Shoulder Strengthening, Seated 10X each, no  rest breaks      Functional Reaching Activities   High Level Pt reaching into flexion to place squigz at overhead level on cabinet door, then removed in flexion.                      OT Short Term Goals - 12/21/20 1320       OT SHORT TERM GOAL #1   Title Patient will be educated and independent with HEP for improved left shoulder mobility.    Time 4    Period Weeks    Status Achieved    Target Date 11/16/20      OT SHORT TERM GOAL #2   Title Patient will improve LUE P/ROM to WNL for improved ability to don shirts independently.    Time 4    Period Weeks      OT SHORT TERM GOAL #3   Title Patient will improve left shoulder strength to 4/5 or better for improved independence with carrying shopping bags.    Time 4    Period Weeks      OT SHORT TERM GOAL #4   Title Patient will decrease pain in her left shoulder to 5/10 or better when completing functional tasks.    Time 4    Period Weeks    Status Achieved               OT Long Term Goals - 12/21/20 1320       OT LONG TERM GOAL #1   Title Patient will return to PLOF with all  desired daily tasks.    Time 8    Period Weeks    Status On-going      OT LONG TERM GOAL #2   Title Patient will improve left shoulder a/rom to [facility] for improved ability to reach overhead and behind her back during ADL completion.    Time 8    Period Weeks    Status On-going      OT LONG TERM GOAL #3   Title Patient will improve left shoulder strength to 5/5 for improved ability to lift grocery bags.    Time 8    Period Weeks    Status Achieved      OT LONG TERM GOAL #4   Title Patient will decrease pain in her left shoulder to 3/10 or better when completing daily tasks.    Time 8    Period Weeks    Status On-going                   Plan - 12/30/20 1513     Clinical Impression Statement A: Pt arrived late for session, no manual techniques or passive stretching completing. Continued with A/ROM, pt completing with ROM WFL. Added x to v arms and proximal shoulder strengthening in sitting, scapular strengthening, and completed functional reaching task. Pt reports min fatigue with tasks, rest breaks provided as needed. Verbal cuing for form and technique.    Body Structure / Function / Physical Skills ADL;Strength;Pain;IADL;ROM;Fascial restriction;Muscle spasms    Plan P: Reassessment and discharge with HEP    OT Home Exercise Plan eval:  table slides; 4/19: AA/ROM; 4/28: A/ROM 6/1: shoulder stretches    Consulted and Agree with Plan of Care Patient             Patient will benefit from skilled therapeutic intervention in order to improve the following deficits and impairments:   Body Structure / Function / Physical Skills: ADL,  Strength, Pain, IADL, ROM, Fascial restriction, Muscle spasms       Visit Diagnosis: Acute pain of left shoulder  Stiffness of left shoulder, not elsewhere classified  Other symptoms and signs involving the musculoskeletal system    Problem List Patient Active Problem List   Diagnosis Date Noted   Recurrent major depressive  disorder, in partial remission ([place]) 08/29/2020   History of DVT (deep vein thrombosis) 12/26/2017   Acute pulmonary embolism without acute cor pulmonale ([place])    Chest pain 12/25/2017   Essential hypertension 12/25/2017   Mixed hyperlipidemia 12/25/2017   Chest pain of uncertain etiology    ABSCESS, TOOTH 09/06/2010   Morbid obesity ([place]) 07/12/2010   CHOLECYSTECTOMY, HX OF 04/25/2010   Depression 03/17/2010   HYPERTENSION, BENIGN ESSENTIAL 10/28/2009   ALLERGIC RHINITIS 10/12/2009   OSA on CPAP 10/12/2009   HYPERPARATHYROIDISM UNSPECIFIED 07/19/2009   KNEE PAIN, BILATERAL 01/11/2009   LABYRINTHITIS, ACUTE 09/29/2008   BACK PAIN, LUMBAR 08/03/2008   CALCANEAL SPUR, RIGHT 07/14/2008   OSTEOPENIA 07/14/2008   NEPHROLITHIASIS 03/18/2008   GASTROENTERITIS, ACUTE 01/08/2008   HEMATURIA UNSPECIFIED 01/08/2008   TEMPOROMANDIBULAR JOINT PAIN 07/09/2007   Dyslipidemia 03/07/2007   GERD 03/07/2007   OSTEOARTHROSIS, LOCAL, PRIMARY, LOWER LEG 03/07/2007   MIGRAINE HEADACHE 03/03/2007   CERVICAL MUSCLE STRAIN 08/67/6195   HELICOBACTER PYLORI INFECTION, HX OF 12/21/2005    Guadelupe Sabin, OTR/L  [phone] 12/30/2020, 3:19 PM  [place], [place], [address] Phone: [phone]   Fax:  [phone]  Name: Kendra [name] MRN: [id number] Date of Birth: Dec 03, 1945

## 2021-01-03 ENCOUNTER — Encounter (HOSPITAL_COMMUNITY): Payer: Medicare HMO | Admitting: Occupational Therapy

## 2021-01-03 ENCOUNTER — Telehealth (HOSPITAL_COMMUNITY): Payer: Self-pay | Admitting: Occupational Therapy

## 2021-01-03 NOTE — Telephone Encounter (Signed)
S/w husband he will tell the patient that she can be seen if she is coughing and has no signs/exposure to COVID as long as she wears her mask and washes her hands.

## 2021-01-20 ENCOUNTER — Encounter: Payer: Medicare HMO | Admitting: Physical Medicine and Rehabilitation

## 2021-01-20 ENCOUNTER — Telehealth: Payer: Self-pay | Admitting: Physical Medicine and Rehabilitation

## 2021-01-20 NOTE — Telephone Encounter (Signed)
Left message #1 to reschedule appointment.

## 2021-01-20 NOTE — Telephone Encounter (Signed)
Pt's husband called. Stated he is feeling sick so he would not be able to bring her to the appt and would like to get rescheduled. The best call back number is [phone].

## 2021-02-06 DIAGNOSIS — M160 Bilateral primary osteoarthritis of hip: Secondary | ICD-10-CM | POA: Diagnosis not present

## 2021-02-06 DIAGNOSIS — B9681 Helicobacter pylori [H. pylori] as the cause of diseases classified elsewhere: Secondary | ICD-10-CM | POA: Diagnosis not present

## 2021-02-06 DIAGNOSIS — R7303 Prediabetes: Secondary | ICD-10-CM | POA: Diagnosis not present

## 2021-02-06 DIAGNOSIS — G43919 Migraine, unspecified, intractable, without status migrainosus: Secondary | ICD-10-CM | POA: Diagnosis not present

## 2021-02-06 DIAGNOSIS — F331 Major depressive disorder, recurrent, moderate: Secondary | ICD-10-CM | POA: Diagnosis not present

## 2021-02-06 DIAGNOSIS — I10 Essential (primary) hypertension: Secondary | ICD-10-CM | POA: Diagnosis not present

## 2021-02-06 DIAGNOSIS — K219 Gastro-esophageal reflux disease without esophagitis: Secondary | ICD-10-CM | POA: Diagnosis not present

## 2021-02-06 DIAGNOSIS — Z6835 Body mass index (BMI) 35.0-35.9, adult: Secondary | ICD-10-CM | POA: Diagnosis not present

## 2021-02-06 DIAGNOSIS — E785 Hyperlipidemia, unspecified: Secondary | ICD-10-CM | POA: Diagnosis not present

## 2021-03-24 ENCOUNTER — Encounter: Payer: Medicare HMO | Admitting: Physical Medicine and Rehabilitation

## 2021-03-24 NOTE — Progress Notes (Deleted)
   Kendra [name] - 75 y.o. female MRN [id number]  Date of birth: 28-Mar-1946  Office Visit Note: Visit Date: 03/24/2021 PCP: System, Provider Not In Referred by: Jessy Oto, MD  Subjective: No chief complaint on file.  HPI:  Kendra [name] is a 75 y.o. female who comes in today at the request of Dr. Basil Dess for electrodiagnostic study of the Left upper extremities.  Patient is Right hand dominant.   ROS Otherwise per HPI.  Assessment & Plan: Visit Diagnoses:    ICD-10-CM   1. Paresthesia of skin  R20.2       Plan: No additional findings.   Meds & Orders: No orders of the defined types were placed in this encounter.  No orders of the defined types were placed in this encounter.   Follow-up: No follow-ups on file.   Procedures: No procedures performed      Clinical History: No specialty comments available.     Objective:  VS:  HT:    WT:   BMI:     BP:   HR: bpm  TEMP: ( )  RESP:  Physical Exam Musculoskeletal:        General: No swelling, tenderness or deformity.     Comments: Inspection reveals no atrophy of the bilateral APB or FDI or hand intrinsics. There is no swelling, color changes, allodynia or dystrophic changes. There is 5 out of 5 strength in the bilateral wrist extension, finger abduction and long finger flexion. There is intact sensation to light touch in all dermatomal and peripheral nerve distributions. There is a negative Froment's test bilaterally. There is a negative Tinel's test at the bilateral wrist and elbow. There is a negative Phalen's test bilaterally. There is a negative Hoffmann's test bilaterally.  Skin:    General: Skin is warm and dry.     Findings: No erythema or rash.  Neurological:     General: No focal deficit present.     Mental Status: She is alert and oriented to person, place, and time.     Motor: No weakness or abnormal muscle tone.     Coordination: Coordination normal.  Psychiatric:        Mood and Affect: Mood  normal.        Behavior: Behavior normal.     Imaging: No results found.

## 2021-03-31 DIAGNOSIS — Z9049 Acquired absence of other specified parts of digestive tract: Secondary | ICD-10-CM | POA: Diagnosis not present

## 2021-03-31 DIAGNOSIS — N1330 Unspecified hydronephrosis: Secondary | ICD-10-CM | POA: Diagnosis not present

## 2021-03-31 DIAGNOSIS — N281 Cyst of kidney, acquired: Secondary | ICD-10-CM | POA: Diagnosis not present

## 2021-03-31 DIAGNOSIS — R109 Unspecified abdominal pain: Secondary | ICD-10-CM | POA: Diagnosis not present

## 2021-04-19 ENCOUNTER — Emergency Department (HOSPITAL_COMMUNITY): Payer: Medicare HMO

## 2021-04-19 ENCOUNTER — Other Ambulatory Visit: Payer: Self-pay

## 2021-04-19 ENCOUNTER — Encounter (HOSPITAL_COMMUNITY): Payer: Self-pay

## 2021-04-19 ENCOUNTER — Emergency Department (HOSPITAL_COMMUNITY)
Admission: EM | Admit: 2021-04-19 | Discharge: 2021-04-19 | Disposition: A | Discharge status: Home / Self Care | Payer: Medicare HMO | Attending: Emergency Medicine | Admitting: Emergency Medicine

## 2021-04-19 DIAGNOSIS — Z79899 Other long term (current) drug therapy: Secondary | ICD-10-CM | POA: Diagnosis not present

## 2021-04-19 DIAGNOSIS — W11XXXA Fall on and from ladder, initial encounter: Secondary | ICD-10-CM | POA: Diagnosis not present

## 2021-04-19 DIAGNOSIS — I6529 Occlusion and stenosis of unspecified carotid artery: Secondary | ICD-10-CM | POA: Diagnosis not present

## 2021-04-19 DIAGNOSIS — S0990XA Unspecified injury of head, initial encounter: Secondary | ICD-10-CM

## 2021-04-19 DIAGNOSIS — Z7901 Long term (current) use of anticoagulants: Secondary | ICD-10-CM | POA: Insufficient documentation

## 2021-04-19 DIAGNOSIS — I10 Essential (primary) hypertension: Secondary | ICD-10-CM | POA: Diagnosis not present

## 2021-04-19 DIAGNOSIS — G319 Degenerative disease of nervous system, unspecified: Secondary | ICD-10-CM | POA: Diagnosis not present

## 2021-04-19 MED ORDER — ACETAMINOPHEN 325 MG PO TABS
650.0000 mg | ORAL_TABLET | Freq: Once | ORAL | Status: AC
  Administered 2021-04-19: 650 mg via ORAL
  Filled 2021-04-19: qty 2

## 2021-04-19 NOTE — ED Triage Notes (Signed)
Pt here with husband. States that yesterday she grabbed onto a ladder when it fell over and hit her on the top of the head. No LOC. C/o headache today.

## 2021-04-19 NOTE — ED Provider Notes (Signed)
[facility] EMERGENCY DEPARTMENT Provider Note  CSN: [id number] Arrival date & time: 04/19/21 10-22-32    History Chief Complaint  Patient presents with  . Head Injury    Kendra [name] is a 75 y.o. female brought to the ED by husband who translates at her request. Yesterday husband was using a ladder in the yard. She was walking by when she stumbled and reached out to steady herself, grabbing the ladder but falling down. The ladder hit her on the right side of her head. She did not have LOC but was dazed. She also sustained an abrasion on her L elbow. She has continued to have a mild headache through the day today. No blurry vision, nausea, vomiting or confusion.    Past Medical History:  Diagnosis Date  . Arthritis   . Chronic back pain   . Conversion disorder    caused by stress of daughter's death in 2009-10-22  . Depression   . GERD (gastroesophageal reflux disease)   . Hypercholesterolemia   . Hypertension   . Kidney stone    stones  . Migraine   . Panic attacks    daily, worse the 15th of each month  . Thyroid disease     Past Surgical History:  Procedure Laterality Date  . ABDOMINAL HYSTERECTOMY     r ovary removal  . APPENDECTOMY    . CHOLECYSTECTOMY    . CYSTOSCOPY    . LEFT HEART CATH AND CORONARY ANGIOGRAPHY N/A 12/26/2017   Procedure: LEFT HEART CATH AND CORONARY ANGIOGRAPHY;  Surgeon: Lorretta Harp, MD;  Location: [facility];  Service: Cardiovascular;  Laterality: N/A;  . THYROID SURGERY      Family History  Problem Relation Age of Onset  . Arthritis Mother   . Migraines Mother   . Cancer Sister   . Cancer Maternal Grandmother     Social History   Tobacco Use  . Smoking status: Never  . Smokeless tobacco: Never  Vaping Use  . Vaping Use: Never used  Substance Use Topics  . Alcohol use: No  . Drug use: No     Home Medications Prior to Admission medications   Medication Sig Start Date End Date Taking? Authorizing Provider   amitriptyline (ELAVIL) 50 MG tablet Take 1 tablet (50 mg total) by mouth at bedtime. 03/31/20   Daleen Squibb, MD  gabapentin (NEURONTIN) 300 MG capsule Take 1 capsule (300 mg total) by mouth daily. 01/18/20   Jacelyn Pi, Lilia Argue, MD  lisinopril (ZESTRIL) 40 MG tablet Take 1 tablet (40 mg total) by mouth daily. 03/31/20   Jacelyn Pi, Lilia Argue, MD  meclizine (ANTIVERT) 25 MG tablet Take 1 tablet (25 mg total) by mouth 3 (three) times daily as needed for dizziness. 05/30/20   Just, Laurita Quint, FNP  Meloxicam 15 MG TBDP Take 1 tablet by mouth daily after breakfast. 08/22/20   Jessy Oto, MD  nitroGLYCERIN (NITROSTAT) 0.4 MG SL tablet Place 1 tablet (0.4 mg total) under the tongue every 5 (five) minutes x 3 doses as needed for chest pain. 12/27/17   Rai, Ripudeep K, MD  OPTIMAL-D 1.25 MG (50000 UT) capsule Take 1 capsule (50,000 Units total) by mouth once a week. 05/30/20   Just, Laurita Quint, FNP  pantoprazole (PROTONIX) 40 MG tablet Take 1 tablet (40 mg total) by mouth daily. 05/30/20   Just, Laurita Quint, FNP  pravastatin (PRAVACHOL) 20 MG tablet Take 20 mg by mouth daily.  [provider]  propranolol (INDERAL) 20 MG tablet Take 1 tablet (20 mg total) by mouth 2 (two) times daily. 08/29/20   Just, Laurita Quint, FNP  risperiDONE (RISPERDAL) 0.5 MG tablet Take 1 tablet (0.5 mg total) by mouth at bedtime. 03/31/20   Daleen Squibb, MD  sertraline (ZOLOFT) 50 MG tablet Take 50 mg by mouth at bedtime.    [provider]  solifenacin (VESICARE) 10 MG tablet Take 10 mg by mouth daily.    [provider]  topiramate (TOPAMAX) 25 MG tablet Take 1 tablet by mouth daily.    [provider]  rivaroxaban (XARELTO) 20 MG TABS tablet Take 1 tablet (20 mg total) by mouth daily with supper. Please fill this prescription after the first starter pack has been completed. 01/16/18 01/03/19  Rai, Vernelle Emerald, MD     Allergies    Hydrocodone, Morphine, and Pineapple   Review of Systems    Review of Systems A comprehensive review of systems was completed and negative except as noted in HPI.    Physical Exam BP (!) 151/82   Pulse 73   Temp 97.8 F (36.6 C)   Resp 19   Ht 5\' 3"  (1.6 m)   Wt 97.9 kg   SpO2 100%   BMI 38.23 kg/m   Physical Exam Vitals and nursing note reviewed.  Constitutional:      Appearance: Normal appearance.  HENT:     Head: Normocephalic and atraumatic.     Nose: Nose normal.     Mouth/Throat:     Mouth: Mucous membranes are moist.  Eyes:     Extraocular Movements: Extraocular movements intact.     Conjunctiva/sclera: Conjunctivae normal.  Cardiovascular:     Rate and Rhythm: Normal rate.  Pulmonary:     Effort: Pulmonary effort is normal.     Breath sounds: Normal breath sounds.  Abdominal:     General: Abdomen is flat.     Palpations: Abdomen is soft.     Tenderness: There is no abdominal tenderness.  Musculoskeletal:        General: No swelling. Normal range of motion.     Cervical back: Neck supple.  Skin:    General: Skin is warm and dry.     Comments: Abrasion L elbow  Neurological:     General: No focal deficit present.     Mental Status: She is alert.     Cranial Nerves: No cranial nerve deficit.     Sensory: No sensory deficit.     Motor: No weakness.  Psychiatric:        Mood and Affect: Mood normal.     ED Results / Procedures / Treatments   Labs (all labs ordered are listed, but only abnormal results are displayed) Labs Reviewed - No data to display  EKG None  Radiology No results found.  Procedures Procedures  Medications Ordered in the ED Medications  acetaminophen (TYLENOL) tablet 650 mg (has no administration in time range)     MDM Rules/Calculators/A&P MDM Given persistent headache after head injury, will send for CT. APAP for headache. Otherwise she is well appearing, alert with no concerning exam findings.   ED Course  I have reviewed the triage vital signs and the nursing  notes.  Pertinent labs & imaging results that were available during my care of the patient were reviewed by me and considered in my medical decision making (see chart for details).  Clinical Course as of 04/19/21 2230  Wed Apr 19, 2021  2225 CT result has not crossed from PACS.   IMPRESSION: 1. No CT evidence for acute intracranial abnormality. 2. Atrophy and mild chronic small vessel ischemic changes of the white matter [CS]  2228 Patient feeling better and ready to go home. Head injury precautions given. PCP follow up. RTED for any other concerns.  [CS]    Clinical Course User Index [CS] Truddie Hidden, MD    Final Clinical Impression(s) / ED Diagnoses Final diagnoses:  None    Rx / DC Orders ED Discharge Orders     None        Truddie Hidden, MD 04/19/21 2230

## 2021-04-19 NOTE — ED Notes (Signed)
Pt hooked up to [facility]. Pts husband is at bedside.

## 2021-04-26 ENCOUNTER — Ambulatory Visit: Payer: Medicare HMO | Admitting: Urology

## 2021-04-26 ENCOUNTER — Encounter: Payer: Self-pay | Admitting: Urology

## 2021-04-26 ENCOUNTER — Other Ambulatory Visit: Payer: Self-pay

## 2021-04-26 ENCOUNTER — Ambulatory Visit (HOSPITAL_COMMUNITY): Admission: RE | Admit: 2021-04-26 | Payer: Medicare HMO | Source: Ambulatory Visit

## 2021-04-26 VITALS — BP 130/82 | HR 88 | Ht 63.0 in | Wt 205.0 lb

## 2021-04-26 DIAGNOSIS — N200 Calculus of kidney: Secondary | ICD-10-CM | POA: Diagnosis not present

## 2021-04-26 DIAGNOSIS — R1032 Left lower quadrant pain: Secondary | ICD-10-CM | POA: Diagnosis not present

## 2021-04-26 LAB — URINALYSIS, ROUTINE W REFLEX MICROSCOPIC
Glucose, UA: NEGATIVE
Nitrite, UA: NEGATIVE
Specific Gravity, UA: >1.03 — ABNORMAL HIGH (ref 1.005–1.030)
Urobilinogen, Ur: 1 mg/dL (ref 0.2–1.0)
pH, UA: 5.5 (ref 5.0–7.5)

## 2021-04-26 NOTE — Progress Notes (Signed)
Urological Symptom Review  Patient is experiencing the following symptoms: Frequent urination Hard to postpone urination Burning/pain with urination Get up at night to urinate Leakage of urine Have to strain to urinate Blood in urine Urinary tract infection Injury to kidneys/bladder   Review of Systems  Gastrointestinal (upper)  : Indigestion/heartburn  Gastrointestinal (lower) : Constipation  Constitutional : Night Sweats Weight loss Fatigue  Skin: Negative for skin symptoms  Eyes: Blurred vision  Ear/Nose/Throat : Sinus problems  Hematologic/Lymphatic: Negative for Hematologic/Lymphatic symptoms  Cardiovascular : Leg swelling Chest pain  Respiratory : Shortness of breath  Endocrine: Excessive thirst  Musculoskeletal: Back pain  Neurological: Headaches Dizziness  Psychologic: Depression

## 2021-04-26 NOTE — Progress Notes (Signed)
04/26/2021 10:32 AM   Kendra [name] 1946-01-14 [id number]  Referring provider: Neale Burly, MD [address] [place],  [place] [address]  Left flank pain   HPI: Kendra [name] is a 75yo here for evaluation of left hydronephrosis and concern for nephrolithiasis. Start 2-3 months ago she has dull, moderate, intermittent left flank pain. NO other associated symptoms. She underwent Abdominal US 9/12 which showed left hydronephrosis. She has a hx of ureteroscopy of [facility].   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 daughter's death in 11-04-09   Depression    GERD (gastroesophageal reflux disease)    Hypercholesterolemia    Hypertension    Kidney stone    stones   Migraine    Panic attacks    daily, worse the 15th of each month   Thyroid disease     Surgical History: Past Surgical History:  Procedure Laterality Date   ABDOMINAL HYSTERECTOMY     r ovary removal   APPENDECTOMY     CHOLECYSTECTOMY     CYSTOSCOPY     LEFT HEART CATH AND CORONARY ANGIOGRAPHY N/A 12/26/2017   Procedure: LEFT HEART CATH AND CORONARY ANGIOGRAPHY;  Surgeon: Lorretta Harp, MD;  Location: [facility];  Service: Cardiovascular;  Laterality: N/A;   THYROID SURGERY      Home Medications:  Allergies as of 04/26/2021       Reactions   Hydrocodone Nausea And Vomiting   Morphine Swelling   REACTION: swelling   Pineapple Swelling   Throat swelling        Medication List        Accurate as of April 26, 2021 10:32 AM. If you have any questions, ask your nurse or doctor.          amitriptyline 50 MG tablet Commonly known as: ELAVIL Take 1 tablet (50 mg total) by mouth at bedtime.   gabapentin 300 MG capsule Commonly known as: NEURONTIN Take 1 capsule (300 mg total) by mouth daily.   lisinopril 40 MG tablet Commonly known as: ZESTRIL Take 1 tablet (40 mg total) by mouth daily.    meclizine 25 MG tablet Commonly known as: ANTIVERT Take 1 tablet (25 mg total) by mouth 3 (three) times daily as needed for dizziness.   Meloxicam 15 MG Tbdp Take 1 tablet by mouth daily after breakfast.   nitroGLYCERIN 0.4 MG SL tablet Commonly known as: NITROSTAT Place 1 tablet (0.4 mg total) under the tongue every 5 (five) minutes x 3 doses as needed for chest pain.   Optimal-D 1.25 MG (50000 UT) capsule Generic drug: Cholecalciferol Take 1 capsule (50,000 Units total) by mouth once a week.   pantoprazole 40 MG tablet Commonly known as: PROTONIX Take 1 tablet (40 mg total) by mouth daily.   pravastatin 20 MG tablet Commonly known as: PRAVACHOL Take 20 mg by mouth daily.   propranolol 20 MG tablet Commonly known as: INDERAL Take 1 tablet (20 mg total) by mouth 2 (two) times daily.   risperiDONE 0.5 MG tablet Commonly known as: RISPERDAL Take 1 tablet (0.5 mg total) by mouth at bedtime.   sertraline 50 MG tablet Commonly known as: ZOLOFT Take 50 mg by mouth at bedtime.   solifenacin 10 MG tablet Commonly known as: VESICARE Take 10 mg by mouth daily.   topiramate 25 MG tablet Commonly known as: TOPAMAX  Take 1 tablet by mouth daily.        Allergies:  Allergies  Allergen Reactions   Hydrocodone Nausea And Vomiting   Morphine Swelling    REACTION: swelling   Pineapple Swelling    Throat swelling    Family History: Family History  Problem Relation Age of Onset   Arthritis Mother    Migraines Mother    Cancer Sister    Cancer Maternal Grandmother     Social History:  reports that she has never smoked. She has never used smokeless tobacco. She reports that she does not drink alcohol and does not use drugs.  ROS: All other review of systems were reviewed and are negative except what is noted above in HPI  Physical Exam: BP 130/82   Pulse 88   Ht 5\' 3"  (1.6 m)   Wt 205 lb (93 kg)   BMI 36.31 kg/m   Constitutional:  Alert and oriented, No acute  distress. HEENT: [place] AT, moist mucus membranes.  Trachea midline, no masses. Cardiovascular: No clubbing, cyanosis, or edema. Respiratory: Normal respiratory effort, no increased work of breathing. GI: Abdomen is soft, nontender, nondistended, no abdominal masses GU: No CVA tenderness.  Lymph: No cervical or inguinal lymphadenopathy. Skin: No rashes, bruises or suspicious lesions. Neurologic: Grossly intact, no focal deficits, moving all 4 extremities. Psychiatric: Normal mood and affect.  Laboratory Data: Lab Results  Component Value Date   WBC 8.2 06/08/2020   HGB 13.8 06/08/2020   HCT 42.9 06/08/2020   MCV 98.4 06/08/2020   PLT 263 06/08/2020    Lab Results  Component Value Date   CREATININE 0.73 08/29/2020    No results found for: PSA  No results found for: TESTOSTERONE  Lab Results  Component Value Date   HGBA1C  06/28/2007    5.8 (NOTE)   The ADA recommends the following therapeutic goals for glycemic   control related to Hgb A1C measurement:   Goal of Therapy:   < 7.0% Hgb A1C   Action Suggested:  > 8.0% Hgb A1C   Ref:  Diabetes Care, 22, Suppl. 1, 1999    Urinalysis    Component Value Date/Time   COLORURINE STRAW (A) 06/08/2020 1848   APPEARANCEUR CLEAR 06/08/2020 1848   LABSPEC 1.031 (H) 06/08/2020 1848   PHURINE 7.0 06/08/2020 1848   GLUCOSEU NEGATIVE 06/08/2020 1848   HGBUR SMALL (A) 06/08/2020 1848   HGBUR trace-intact 04/27/2009 1109   [place] 06/08/2020 1848   BILIRUBINUR small (A) 03/31/2020 [place] 06/08/2020 1848   PROTEINUR NEGATIVE 06/08/2020 1848   UROBILINOGEN 1.0 03/31/2020 1537   UROBILINOGEN 0.2 03/15/2015 2324   NITRITE NEGATIVE 06/08/2020 1848   LEUKOCYTESUR TRACE (A) 06/08/2020 1848    Lab Results  Component Value Date   BACTERIA NONE SEEN 06/08/2020    Pertinent Imaging: Renal US 03/31/2021: Images reviewed and discussed with the patient No results found for this or any previous visit.  Results  for orders placed during the hospital encounter of 06/15/20  US Venous Img Lower Bilateral (DVT)  Narrative CLINICAL DATA:  Lower extremity pain  EXAM: BILATERAL LOWER EXTREMITY VENOUS DOPPLER ULTRASOUND  TECHNIQUE: Gray-scale sonography with graded compression, as well as color Doppler and duplex ultrasound were performed to evaluate the lower extremity deep venous systems from the level of the common femoral vein and including the common femoral, femoral, profunda femoral, popliteal and calf veins including the posterior tibial, peroneal and gastrocnemius veins when visible. The superficial great saphenous  vein was also interrogated. Spectral Doppler was utilized to evaluate flow at rest and with distal augmentation maneuvers in the common femoral, femoral and popliteal veins.  COMPARISON:  None.  FINDINGS: RIGHT LOWER EXTREMITY  Common Femoral Vein: No evidence of thrombus. Normal compressibility, respiratory phasicity and response to augmentation.  Saphenofemoral Junction: No evidence of thrombus. Normal compressibility and flow on color Doppler imaging.  Profunda Femoral Vein: No evidence of thrombus. Normal compressibility and flow on color Doppler imaging.  Femoral Vein: No evidence of thrombus. Normal compressibility, respiratory phasicity and response to augmentation.  Popliteal Vein: No evidence of thrombus. Normal compressibility, respiratory phasicity and response to augmentation.  Calf Veins: No evidence of thrombus. Normal compressibility and flow on color Doppler imaging.  Superficial Great Saphenous Vein: No evidence of thrombus. Normal compressibility.  LEFT LOWER EXTREMITY  Common Femoral Vein: No evidence of thrombus. Normal compressibility, respiratory phasicity and response to augmentation.  Saphenofemoral Junction: No evidence of thrombus. Normal compressibility and flow on color Doppler imaging.  Profunda Femoral Vein: No evidence of  thrombus. Normal compressibility and flow on color Doppler imaging.  Femoral Vein: No evidence of thrombus. Normal compressibility, respiratory phasicity and response to augmentation. Nonspecific focal ectasia of the left distal femoral vein with a 2 cm diameter. Negative for thrombus.  Popliteal Vein: No evidence of thrombus. Normal compressibility, respiratory phasicity and response to augmentation.  Calf Veins: No evidence of thrombus. Normal compressibility and flow on color Doppler imaging.  Superficial Great Saphenous Vein: No evidence of thrombus. Normal compressibility.  IMPRESSION: Negative for DVT in either extremity.  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   Electronically Signed By: Jerilynn Mages.  Shick M.D. On: 06/15/2020 12:39  No results found for this or any previous visit.  No results found for this or any previous visit.  No results found for this or any previous visit.  No results found for this or any previous visit.  No results found for this or any previous visit.  Results for orders placed during the hospital encounter of 03/15/15  CT RENAL STONE STUDY  Narrative CLINICAL DATA:  Left-sided kidney stones  EXAM: CT ABDOMEN AND PELVIS WITHOUT CONTRAST  TECHNIQUE: Multidetector CT imaging of the abdomen and pelvis was performed following the standard protocol without IV contrast.  COMPARISON:  12/08/2013  FINDINGS: Lower chest: The lung bases are clear. No pleural or pericardial effusion.  Hepatobiliary: Similar appearance of partially calcified structure within segment 6 of the liver measuring 1.5 cm, image 30/series 2. Previous cholecystectomy. No biliary dilatation.  Pancreas: Negative.  Spleen: Negative.  Adrenals/Urinary Tract: The adrenal glands are both normal. There is a nonobstructing left renal calculus within the inferior pole measuring 4 mm. Right-sided renal sinus cyst versus  mild hydronephrosis is noted. The appearance is similar to previous exam. No right-sided kidney stones identified. No right hydroureter or ureteral lithiasis. Left-sided renal sinus cysts versus hydronephrosis is similar to previous studies. No obstructing calculus identified. Urinary bladder appears normal.  Stomach/Bowel: The stomach is normal. The small bowel loops have a normal course and caliber. No obstruction. Normal appearance of the colon.  Vascular/Lymphatic: Calcified atherosclerotic disease involves the abdominal aorta. No aneurysm. No enlarged retroperitoneal or mesenteric adenopathy. No enlarged pelvic or inguinal lymph nodes.  Reproductive: Previous hysterectomy.  No adnexal mass.  Other: No free fluid or fluid collections identified within the abdomen or pelvis.  Musculoskeletal: Degenerative disc disease is noted at L2-3 and L5-S1.  No aggressive lytic or sclerotic bone lesion.  IMPRESSION: 1. No obstructing calculus identified. No ureteral calculi identified. 2. 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 There is a nonobstructing calculus within the mid left kidney. 3. Aortic atherosclerosis.   Electronically Signed By: Kerby Moors M.D. On: 03/15/2015 23:59   Assessment & Plan:    1. Kidney stones -STAT Ct stone study - Urinalysis, Routine w reflex microscopic  2. Left lower quadrant abdominal pain STAT CT stone study, will call with results   No follow-ups on file.  Nicolette Bang, MD  [facility] [place]

## 2021-04-28 ENCOUNTER — Ambulatory Visit (HOSPITAL_COMMUNITY): Payer: Medicare HMO

## 2021-05-03 ENCOUNTER — Ambulatory Visit (HOSPITAL_COMMUNITY)
Admission: RE | Admit: 2021-05-03 | Discharge: 2021-05-03 | Disposition: A | Discharge status: Home / Self Care | Payer: Medicare HMO | Source: Ambulatory Visit | Attending: Urology | Admitting: Urology

## 2021-05-03 ENCOUNTER — Other Ambulatory Visit: Payer: Self-pay

## 2021-05-03 DIAGNOSIS — K5730 Diverticulosis of large intestine without perforation or abscess without bleeding: Secondary | ICD-10-CM | POA: Diagnosis not present

## 2021-05-03 DIAGNOSIS — K769 Liver disease, unspecified: Secondary | ICD-10-CM | POA: Diagnosis not present

## 2021-05-03 DIAGNOSIS — R319 Hematuria, unspecified: Secondary | ICD-10-CM | POA: Diagnosis not present

## 2021-05-03 DIAGNOSIS — M5136 Other intervertebral disc degeneration, lumbar region: Secondary | ICD-10-CM | POA: Diagnosis not present

## 2021-05-03 DIAGNOSIS — R1032 Left lower quadrant pain: Secondary | ICD-10-CM | POA: Diagnosis not present

## 2021-05-08 DIAGNOSIS — K219 Gastro-esophageal reflux disease without esophagitis: Secondary | ICD-10-CM | POA: Diagnosis not present

## 2021-05-08 DIAGNOSIS — I10 Essential (primary) hypertension: Secondary | ICD-10-CM | POA: Diagnosis not present

## 2021-05-08 DIAGNOSIS — F331 Major depressive disorder, recurrent, moderate: Secondary | ICD-10-CM | POA: Diagnosis not present

## 2021-05-08 DIAGNOSIS — E785 Hyperlipidemia, unspecified: Secondary | ICD-10-CM | POA: Diagnosis not present

## 2021-05-08 DIAGNOSIS — G43919 Migraine, unspecified, intractable, without status migrainosus: Secondary | ICD-10-CM | POA: Diagnosis not present

## 2021-05-08 DIAGNOSIS — Z6834 Body mass index (BMI) 34.0-34.9, adult: Secondary | ICD-10-CM | POA: Diagnosis not present

## 2021-05-08 DIAGNOSIS — M160 Bilateral primary osteoarthritis of hip: Secondary | ICD-10-CM | POA: Diagnosis not present

## 2021-05-08 DIAGNOSIS — Z0000 Encounter for general adult medical examination without abnormal findings: Secondary | ICD-10-CM | POA: Diagnosis not present

## 2021-05-09 ENCOUNTER — Encounter: Payer: Self-pay | Admitting: Urology

## 2021-05-09 ENCOUNTER — Telehealth: Payer: Self-pay

## 2021-05-09 NOTE — Telephone Encounter (Signed)
Tried to call pt no vm was set up .

## 2021-05-09 NOTE — Telephone Encounter (Signed)
-----   Message from Cleon Gustin, MD sent at 05/09/2021  9:47 AM EDT ----- Ct was negative for hydronephrosis and no calculi were seen ----- Message ----- From: Mardelle Matte, CMA Sent: 05/03/2021  12:05 PM EDT To: Cleon Gustin, MD  Please review

## 2021-05-09 NOTE — Patient Instructions (Signed)
Dietary Guidelines to Help Prevent Kidney Stones Kidney stones are deposits of minerals and salts that form inside your kidneys. Your risk of developing kidney stones may be greater depending on your diet, your lifestyle, the medicines you take, and whether you have certain medical conditions. Most people can lower their chances of developing kidney stones by following the instructions below. Your dietitian may give you more specific instructions depending on your overall health and the type of kidney stones you tend to develop. What are tips for following this plan? Reading food labels  Choose foods with "no salt added" or "low-salt" labels. Limit your salt (sodium) intake to less than 1,500 mg a day. Choose foods with calcium for each meal and snack. Try to eat about 300 mg of calcium at each meal. Foods that contain 200-500 mg of calcium a serving include: 8 oz (237 mL) of milk, calcium-fortifiednon-dairy milk, and calcium-fortifiedfruit juice. Calcium-fortified means that calcium has been added to these drinks. 8 oz (237 mL) of kefir, yogurt, and soy yogurt. 4 oz (114 g) of tofu. 1 oz (28 g) of cheese. 1 cup (150 g) of dried figs. 1 cup (91 g) of cooked broccoli. One 3 oz (85 g) can of sardines or mackerel. Most people need 1,000-1,500 mg of calcium a day. Talk to your dietitian about how much calcium is recommended for you. Shopping Buy plenty of fresh fruits and vegetables. Most people do not need to avoid fruits and vegetables, even if these foods contain nutrients that may contribute to kidney stones. When shopping for convenience foods, choose: Whole pieces of fruit. Pre-made salads with dressing on the side. Low-fat fruit and yogurt smoothies. Avoid buying frozen meals or prepared deli foods. These can be high in sodium. Look for foods with live cultures, such as yogurt and kefir. Choose high-fiber grains, such as whole-wheat breads, oat bran, and wheat cereals. Cooking Do not add  salt to food when cooking. Place a salt shaker on the table and allow each person to add his or her own salt to taste. Use vegetable protein, such as beans, textured vegetable protein (TVP), or tofu, instead of meat in pasta, casseroles, and soups. Meal planning Eat less salt, if told by your dietitian. To do this: Avoid eating processed or pre-made food. Avoid eating fast food. Eat less animal protein, including cheese, meat, poultry, or fish, if told by your dietitian. To do this: Limit the number of times you have meat, poultry, fish, or cheese each week. Eat a diet free of meat at least 2 days a week. Eat only one serving each day of meat, poultry, fish, or seafood. When you prepare animal protein, cut pieces into small portion sizes. For most meat and fish, one serving is about the size of the palm of your hand. Eat at least five servings of fresh fruits and vegetables each day. To do this: Keep fruits and vegetables on hand for snacks. Eat one piece of fruit or a handful of berries with breakfast. Have a salad and fruit at lunch. Have two kinds of vegetables at dinner. Limit foods that are high in a substance called oxalate. These include: Spinach (cooked), rhubarb, beets, sweet potatoes, and Swiss chard. Peanuts. Potato chips, french fries, and baked potatoes with skin on. Nuts and nut products. Chocolate. If you regularly take a diuretic medicine, make sure to eat at least 1 or 2 servings of fruits or vegetables that are high in potassium each day. These include: Avocado. Banana. Orange, prune,   carrot, or tomato juice. Baked potato. Cabbage. Beans and split peas. Lifestyle  Drink enough fluid to keep your urine pale yellow. This is the most important thing you can do. Spread your fluid intake throughout the day. If you drink alcohol: Limit how much you use to: 0-1 drink a day for women who are not pregnant. 0-2 drinks a day for men. Be aware of how much alcohol is in your  drink. In the [place] one drink equals one 12 oz bottle of beer (355 mL), one 5 oz glass of wine (148 mL), or one 1 oz glass of hard liquor (44 mL). Lose weight if told by your health care provider. Work with your dietitian to find an eating plan and weight loss strategies that work best for you. General information Talk to your health care provider and dietitian about taking daily supplements. You may be told the following depending on your health and the cause of your kidney stones: Not to take supplements with vitamin C. To take a calcium supplement. To take a daily probiotic supplement. To take other supplements such as magnesium, fish oil, or vitamin B6. Take over-the-counter and prescription medicines only as told by your health care provider. These include supplements. What foods should I limit? Limit your intake of the following foods, or eat them as told by your dietitian. Vegetables Spinach. Rhubarb. Beets. Canned vegetables. Pickles. Olives. Baked potatoes with skin. Grains Wheat bran. Baked goods. Salted crackers. Cereals high in sugar. Meats and other proteins Nuts. Nut butters. Large portions of meat, poultry, or fish. Salted, precooked, or cured meats, such as sausages, meat loaves, and hot dogs. Dairy Cheese. Beverages Regular soft drinks. Regular vegetable juice. Seasonings and condiments Seasoning blends with salt. Salad dressings. Soy sauce. Ketchup. Barbecue sauce. Other foods Canned soups. Canned pasta sauce. Casseroles. Pizza. Lasagna. Frozen meals. Potato chips. French fries. The items listed above may not be a complete list of foods and beverages you should limit. Contact a dietitian for more information. What foods should I avoid? Talk to your dietitian about specific foods you should avoid based on the type of kidney stones you have and your overall health. Fruits Grapefruit. The item listed above may not be a complete list of foods and beverages you should  avoid. Contact a dietitian for more information. Summary Kidney stones are deposits of minerals and salts that form inside your kidneys. You can lower your risk of kidney stones by making changes to your diet. The most important thing you can do is drink enough fluid. Drink enough fluid to keep your urine pale yellow. Talk to your dietitian about how much calcium you should have each day, and eat less salt and animal protein as told by your dietitian. This information is not intended to replace advice given to you by your health care provider. Make sure you discuss any questions you have with your health care provider. Document Revised: 07/02/2019 Document Reviewed: 07/02/2019 Elsevier Patient Education  2022 [employer]  

## 2021-05-23 ENCOUNTER — Encounter: Payer: Self-pay | Admitting: Urology

## 2021-05-23 ENCOUNTER — Ambulatory Visit (INDEPENDENT_AMBULATORY_CARE_PROVIDER_SITE_OTHER): Payer: Medicare HMO | Admitting: Urology

## 2021-05-23 ENCOUNTER — Other Ambulatory Visit: Payer: Self-pay

## 2021-05-23 DIAGNOSIS — M549 Dorsalgia, unspecified: Secondary | ICD-10-CM | POA: Diagnosis not present

## 2021-05-23 DIAGNOSIS — N200 Calculus of kidney: Secondary | ICD-10-CM | POA: Diagnosis not present

## 2021-05-23 DIAGNOSIS — R1032 Left lower quadrant pain: Secondary | ICD-10-CM | POA: Diagnosis not present

## 2021-05-23 MED ORDER — CYCLOBENZAPRINE HCL 5 MG PO TABS: 5.0000 mg | ORAL_TABLET | Freq: Three times a day (TID) | ORAL | 0 refills | Status: DC | PRN

## 2021-05-23 NOTE — Progress Notes (Signed)
05/23/2021 2:48 PM   Renold Genta 1946/07/10 [id number]  Referring provider: Neale Burly, MD [address] [place],  [place] [address]  Patient location: home Physician location: office I connected with  SUNI JARNAGIN on 05/23/21 by a video enabled telemedicine application and verified that I am speaking with the correct person using two identifiers.   I discussed the limitations of evaluation and management by telemedicine. The patient expressed understanding and agreed to proceed.    Followup CT results  HPI: Ms Styles is a 75yo here ofr followup for left back/flank pain. She continues to have mild to moderate , dull nonraditing left back pain that is worse with activity and worse with lifting. She underwent CT stone study on 10/12 which showed peripelvic cysts and no calculus. She denies worsening LUTS. No hematuria or dysuria. 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's death in 11-02-09   Depression    GERD (gastroesophageal reflux disease)    Hypercholesterolemia    Hypertension    Kidney stone    stones   Migraine    Panic attacks    daily, worse the 15th of each month   Thyroid disease     Surgical History: Past Surgical History:  Procedure Laterality Date   ABDOMINAL HYSTERECTOMY     r ovary removal   APPENDECTOMY     CHOLECYSTECTOMY     CYSTOSCOPY     LEFT HEART CATH AND CORONARY ANGIOGRAPHY N/A 12/26/2017   Procedure: LEFT HEART CATH AND CORONARY ANGIOGRAPHY;  Surgeon: Lorretta Harp, MD;  Location: [facility];  Service: Cardiovascular;  Laterality: N/A;   THYROID SURGERY      Home Medications:  Allergies as of 05/23/2021       Reactions   Hydrocodone Nausea And Vomiting   Morphine Swelling   REACTION: swelling   Pineapple Swelling   Throat swelling        Medication List        Accurate as of May 23, 2021  2:48 PM. If you have any questions, ask your nurse or doctor.          amitriptyline 50 MG tablet Commonly known as: ELAVIL Take 1 tablet (50 mg total) by mouth at bedtime.   gabapentin 300 MG capsule Commonly known as: NEURONTIN Take 1 capsule (300 mg total) by mouth daily.   lisinopril 40 MG tablet Commonly known as: ZESTRIL Take 1 tablet (40 mg total) by mouth daily.   meclizine 25 MG tablet Commonly known as: ANTIVERT Take 1 tablet (25 mg total) by mouth 3 (three) times daily as needed for dizziness.   Meloxicam 15 MG Tbdp Take 1 tablet by mouth daily after breakfast.   nitroGLYCERIN 0.4 MG SL tablet Commonly known as: NITROSTAT Place 1 tablet (0.4 mg total) under the tongue every 5 (five) minutes x 3 doses as needed for chest pain.   Optimal-D 1.25 MG (50000 UT) capsule Generic drug: Cholecalciferol Take 1 capsule (50,000 Units total) by mouth once a week.   pantoprazole 40 MG tablet Commonly known as: PROTONIX Take 1 tablet (40 mg total) by mouth daily.   pravastatin 20 MG tablet Commonly known as: PRAVACHOL Take 20 mg by mouth daily.   propranolol 20 MG tablet Commonly known as: INDERAL Take 1 tablet (20 mg total) by  mouth 2 (two) times daily.   risperiDONE 0.5 MG tablet Commonly known as: RISPERDAL Take 1 tablet (0.5 mg total) by mouth at bedtime.   sertraline 50 MG tablet Commonly known as: ZOLOFT Take 50 mg by mouth at bedtime.   solifenacin 10 MG tablet Commonly known as: VESICARE Take 10 mg by mouth daily.   topiramate 25 MG tablet Commonly known as: TOPAMAX Take 1 tablet by mouth daily.        Allergies:  Allergies  Allergen Reactions   Hydrocodone Nausea And Vomiting   Morphine Swelling    REACTION: swelling   Pineapple Swelling    Throat swelling    Family History: Family History  Problem Relation Age of Onset   Arthritis Mother    Migraines Mother    Cancer Sister    Cancer Maternal Grandmother     Social  History:  reports that she has never smoked. She has never used smokeless tobacco. She reports that she does not drink alcohol and does not use drugs.  ROS: All other review of systems were reviewed and are negative except what is noted above in HPI   Laboratory Data: Lab Results  Component Value Date   WBC 8.2 06/08/2020   HGB 13.8 06/08/2020   HCT 42.9 06/08/2020   MCV 98.4 06/08/2020   PLT 263 06/08/2020    Lab Results  Component Value Date   CREATININE 0.73 08/29/2020    No results found for: PSA  No results found for: TESTOSTERONE  Lab Results  Component Value Date   HGBA1C  06/28/2007    5.8 (NOTE)   The ADA recommends the following therapeutic goals for glycemic   control related to Hgb A1C measurement:   Goal of Therapy:   < 7.0% Hgb A1C   Action Suggested:  > 8.0% Hgb A1C   Ref:  Diabetes Care, 22, Suppl. 1, 1999    Urinalysis    Component Value Date/Time   COLORURINE STRAW (A) 06/08/2020 1848   APPEARANCEUR Clear 04/26/2021 1015   LABSPEC 1.031 (H) 06/08/2020 1848   PHURINE 7.0 06/08/2020 1848   GLUCOSEU Negative 04/26/2021 1015   HGBUR SMALL (A) 06/08/2020 1848   HGBUR trace-intact 04/27/2009 1109   BILIRUBINUR Present 04/26/2021 [place] 06/08/2020 1848   PROTEINUR 1+ (A) 04/26/2021 1015   PROTEINUR NEGATIVE 06/08/2020 1848   UROBILINOGEN 1.0 03/31/2020 1537   UROBILINOGEN 0.2 03/15/2015 2324   NITRITE Negative 04/26/2021 1015   NITRITE NEGATIVE 06/08/2020 1848   LEUKOCYTESUR Trace (A) 04/26/2021 1015   LEUKOCYTESUR TRACE (A) 06/08/2020 1848    Lab Results  Component Value Date   LABMICR Comment 04/26/2021   BACTERIA NONE SEEN 06/08/2020    Pertinent Imaging: Ct stone study 10/12: Images reviewed and discussed with the patient No results found for this or any previous visit.  Results for orders placed during the hospital encounter of 06/15/20  US Venous Img Lower Bilateral (DVT)  Narrative CLINICAL DATA:  Lower  extremity pain  EXAM: BILATERAL LOWER EXTREMITY VENOUS DOPPLER ULTRASOUND  TECHNIQUE: Gray-scale sonography with graded compression, as well as color Doppler and duplex ultrasound were performed to evaluate the lower extremity deep venous systems from the level of the common femoral vein and including the common femoral, femoral, profunda femoral, popliteal and calf veins including the posterior tibial, peroneal and gastrocnemius veins when visible. The superficial great saphenous vein was also interrogated. Spectral Doppler was utilized to evaluate flow at rest and with distal augmentation maneuvers  in the common femoral, femoral and popliteal veins.  COMPARISON:  None.  FINDINGS: RIGHT LOWER EXTREMITY  Common Femoral Vein: No evidence of thrombus. Normal compressibility, respiratory phasicity and response to augmentation.  Saphenofemoral Junction: No evidence of thrombus. Normal compressibility and flow on color Doppler imaging.  Profunda Femoral Vein: No evidence of thrombus. Normal compressibility and flow on color Doppler imaging.  Femoral Vein: No evidence of thrombus. Normal compressibility, respiratory phasicity and response to augmentation.  Popliteal Vein: No evidence of thrombus. Normal compressibility, respiratory phasicity and response to augmentation.  Calf Veins: No evidence of thrombus. Normal compressibility and flow on color Doppler imaging.  Superficial Great Saphenous Vein: No evidence of thrombus. Normal compressibility.  LEFT LOWER EXTREMITY  Common Femoral Vein: No evidence of thrombus. Normal compressibility, respiratory phasicity and response to augmentation.  Saphenofemoral Junction: No evidence of thrombus. Normal compressibility and flow on color Doppler imaging.  Profunda Femoral Vein: No evidence of thrombus. Normal compressibility and flow on color Doppler imaging.  Femoral Vein: No evidence of thrombus. Normal  compressibility, respiratory phasicity and response to augmentation. Nonspecific focal ectasia of the left distal femoral vein with a 2 cm diameter. Negative for thrombus.  Popliteal Vein: No evidence of thrombus. Normal compressibility, respiratory phasicity and response to augmentation.  Calf Veins: No evidence of thrombus. Normal compressibility and flow on color Doppler imaging.  Superficial Great Saphenous Vein: No evidence of thrombus. Normal compressibility.  IMPRESSION: Negative for DVT in either extremity.  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   Electronically Signed By: Jerilynn Mages.  Shick M.D. On: 06/15/2020 12:39  No results found for this or any previous visit.  No results found for this or any previous visit.  No results found for this or any previous visit.  No results found for this or any previous visit.  No results found for this or any previous visit.  Results for orders placed in visit on 04/26/21  CT RENAL STONE STUDY  Narrative CLINICAL DATA:  Bilateral flank pain with hematuria.  EXAM: CT ABDOMEN AND PELVIS WITHOUT CONTRAST  TECHNIQUE: Multidetector CT imaging of the abdomen and pelvis was performed following the standard protocol without IV contrast.  COMPARISON:  03/15/2015  FINDINGS: Lower chest: Unremarkable.  Hepatobiliary: 15 mm inferior right liver lesion with central calcification is stable on image 30/2 today. Gallbladder is surgically absent. No intrahepatic or extrahepatic biliary dilation.  Pancreas: Pancreas is diffusely fatty replaced. No main duct dilatation.  Spleen: SIRT normal spleen  Adrenals/Urinary Tract: No adrenal nodule or mass. 19 mm low-density lesion identified interpolar right kidney, new since prior, approaches water density and is likely a cyst. Central sinus cysts versus mild hydronephrosis in both kidneys is stable in the interval. No evidence for  hydroureter. The urinary bladder appears normal for the degree of distention.  Stomach/Bowel: Stomach is unremarkable. No gastric wall thickening. No evidence of outlet obstruction. Duodenum is normally positioned as is the ligament of Treitz. No small bowel wall thickening. No small bowel dilatation. The terminal ileum is normal. The appendix is not well visualized, but there is no edema or inflammation in the region of the cecum. No gross colonic mass. No colonic wall thickening. Diverticular changes are noted in the left colon without evidence of diverticulitis.  Vascular/Lymphatic: There is moderate atherosclerotic calcification of the abdominal aorta without aneurysm. There is no gastrohepatic or hepatoduodenal ligament lymphadenopathy. No retroperitoneal or mesenteric lymphadenopathy. No pelvic sidewall lymphadenopathy.  Reproductive:  The uterus is surgically absent. There is no adnexal mass.  Other: No intraperitoneal free fluid.  Musculoskeletal: No worrisome lytic or sclerotic osseous abnormality. Degenerative disc disease noted lumbar spine.  IMPRESSION: 1. No acute findings in the abdomen or pelvis. Specifically, no findings to explain the patient's history of bilateral flank pain and hematuria. 2. Stable appearance of central sinus cysts versus mild bilateral hydronephrosis. 3. Stable 15 mm inferior right liver lesion with central calcification. Nonspecific, but long interval stability is consistent with benign etiology. 4. Left colonic diverticulosis without diverticulitis. 5. Aortic Atherosclerosis (ICD10-I70.0).   Electronically Signed By: Misty Stanley M.D. On: 05/03/2021 11:59   Assessment & Plan:    1. Left lower quadrant abdominal pain -likely musculoskeletal given CT findings  3. Dorsalgia -We will trial flexeril 5mg  TID prn   No follow-ups on file.  Nicolette Bang, MD  [facility] [place]

## 2021-05-23 NOTE — Patient Instructions (Signed)

## 2021-05-26 DIAGNOSIS — R197 Diarrhea, unspecified: Secondary | ICD-10-CM | POA: Diagnosis not present

## 2021-06-02 ENCOUNTER — Observation Stay (HOSPITAL_COMMUNITY)
Admission: EM | Admit: 2021-06-02 | Discharge: 2021-06-03 | Disposition: A | Discharge status: Home Health | Payer: Medicare HMO | Attending: Family Medicine | Admitting: Family Medicine

## 2021-06-02 ENCOUNTER — Emergency Department (HOSPITAL_COMMUNITY): Payer: Medicare HMO

## 2021-06-02 ENCOUNTER — Encounter (HOSPITAL_COMMUNITY): Payer: Self-pay

## 2021-06-02 ENCOUNTER — Other Ambulatory Visit: Payer: Self-pay

## 2021-06-02 ENCOUNTER — Observation Stay (HOSPITAL_COMMUNITY): Payer: Medicare HMO

## 2021-06-02 DIAGNOSIS — E039 Hypothyroidism, unspecified: Secondary | ICD-10-CM | POA: Diagnosis not present

## 2021-06-02 DIAGNOSIS — F449 Dissociative and conversion disorder, unspecified: Secondary | ICD-10-CM | POA: Diagnosis present

## 2021-06-02 DIAGNOSIS — G43909 Migraine, unspecified, not intractable, without status migrainosus: Secondary | ICD-10-CM | POA: Diagnosis present

## 2021-06-02 DIAGNOSIS — Z9989 Dependence on other enabling machines and devices: Secondary | ICD-10-CM | POA: Diagnosis not present

## 2021-06-02 DIAGNOSIS — I6782 Cerebral ischemia: Secondary | ICD-10-CM | POA: Diagnosis not present

## 2021-06-02 DIAGNOSIS — Z86718 Personal history of other venous thrombosis and embolism: Secondary | ICD-10-CM

## 2021-06-02 DIAGNOSIS — R29898 Other symptoms and signs involving the musculoskeletal system: Secondary | ICD-10-CM

## 2021-06-02 DIAGNOSIS — R202 Paresthesia of skin: Secondary | ICD-10-CM | POA: Diagnosis not present

## 2021-06-02 DIAGNOSIS — R4789 Other speech disturbances: Secondary | ICD-10-CM | POA: Diagnosis not present

## 2021-06-02 DIAGNOSIS — G4733 Obstructive sleep apnea (adult) (pediatric): Secondary | ICD-10-CM

## 2021-06-02 DIAGNOSIS — R2689 Other abnormalities of gait and mobility: Secondary | ICD-10-CM | POA: Diagnosis not present

## 2021-06-02 DIAGNOSIS — Z20822 Contact with and (suspected) exposure to covid-19: Secondary | ICD-10-CM | POA: Insufficient documentation

## 2021-06-02 DIAGNOSIS — G459 Transient cerebral ischemic attack, unspecified: Principal | ICD-10-CM | POA: Insufficient documentation

## 2021-06-02 DIAGNOSIS — I10 Essential (primary) hypertension: Secondary | ICD-10-CM | POA: Insufficient documentation

## 2021-06-02 DIAGNOSIS — Z79899 Other long term (current) drug therapy: Secondary | ICD-10-CM | POA: Insufficient documentation

## 2021-06-02 DIAGNOSIS — I6501 Occlusion and stenosis of right vertebral artery: Secondary | ICD-10-CM | POA: Diagnosis not present

## 2021-06-02 DIAGNOSIS — R531 Weakness: Secondary | ICD-10-CM | POA: Diagnosis not present

## 2021-06-02 DIAGNOSIS — I6523 Occlusion and stenosis of bilateral carotid arteries: Secondary | ICD-10-CM | POA: Diagnosis not present

## 2021-06-02 DIAGNOSIS — R079 Chest pain, unspecified: Secondary | ICD-10-CM | POA: Diagnosis present

## 2021-06-02 DIAGNOSIS — I6601 Occlusion and stenosis of right middle cerebral artery: Secondary | ICD-10-CM | POA: Diagnosis not present

## 2021-06-02 DIAGNOSIS — J309 Allergic rhinitis, unspecified: Secondary | ICD-10-CM | POA: Diagnosis present

## 2021-06-02 DIAGNOSIS — I200 Unstable angina: Secondary | ICD-10-CM

## 2021-06-02 DIAGNOSIS — R410 Disorientation, unspecified: Secondary | ICD-10-CM | POA: Diagnosis not present

## 2021-06-02 DIAGNOSIS — E785 Hyperlipidemia, unspecified: Secondary | ICD-10-CM | POA: Diagnosis present

## 2021-06-02 DIAGNOSIS — E213 Hyperparathyroidism, unspecified: Secondary | ICD-10-CM | POA: Diagnosis present

## 2021-06-02 DIAGNOSIS — K219 Gastro-esophageal reflux disease without esophagitis: Secondary | ICD-10-CM | POA: Diagnosis present

## 2021-06-02 DIAGNOSIS — R42 Dizziness and giddiness: Secondary | ICD-10-CM | POA: Diagnosis not present

## 2021-06-02 DIAGNOSIS — R519 Headache, unspecified: Secondary | ICD-10-CM | POA: Diagnosis not present

## 2021-06-02 LAB — CBC
HCT: 40.3 % (ref 36.0–46.0)
Hemoglobin: 13.4 g/dL (ref 12.0–15.0)
MCH: 32.9 pg (ref 26.0–34.0)
MCHC: 33.3 g/dL (ref 30.0–36.0)
MCV: 99 fL (ref 80.0–100.0)
Platelets: 221 10*3/uL (ref 150–400)
RBC: 4.07 MIL/uL (ref 3.87–5.11)
RDW: 14.6 % (ref 11.5–15.5)
WBC: 6.3 10*3/uL (ref 4.0–10.5)
nRBC: 0 % (ref 0.0–0.2)

## 2021-06-02 LAB — COMPREHENSIVE METABOLIC PANEL
ALT: 18 U/L (ref 0–44)
AST: 28 U/L (ref 15–41)
Albumin: 3.5 g/dL (ref 3.5–5.0)
Alkaline Phosphatase: 100 U/L (ref 38–126)
Anion gap: 6 (ref 5–15)
BUN: 18 mg/dL (ref 8–23)
CO2: 29 mmol/L (ref 22–32)
Calcium: 8.8 mg/dL — ABNORMAL LOW (ref 8.9–10.3)
Chloride: 104 mmol/L (ref 98–111)
Creatinine, Ser: 0.79 mg/dL (ref 0.44–1.00)
GFR, Estimated: >60 mL/min (ref >60)
Glucose, Bld: 110 mg/dL — ABNORMAL HIGH (ref 70–99)
Potassium: 4.7 mmol/L (ref 3.5–5.1)
Sodium: 139 mmol/L (ref 135–145)
Total Bilirubin: 1.1 mg/dL (ref 0.3–1.2)
Total Protein: 6.2 g/dL — ABNORMAL LOW (ref 6.5–8.1)

## 2021-06-02 LAB — DIFFERENTIAL
Abs Immature Granulocytes: 0.01 10*3/uL (ref 0.00–0.07)
Basophils Absolute: 0.1 10*3/uL (ref 0.0–0.1)
Basophils Relative: 1 %
Eosinophils Absolute: 0.5 10*3/uL (ref 0.0–0.5)
Eosinophils Relative: 7 %
Immature Granulocytes: 0 %
Lymphocytes Relative: 19 %
Lymphs Abs: 1.2 10*3/uL (ref 0.7–4.0)
Monocytes Absolute: 0.5 10*3/uL (ref 0.1–1.0)
Monocytes Relative: 7 %
Neutro Abs: 4.1 10*3/uL (ref 1.7–7.7)
Neutrophils Relative %: 66 %

## 2021-06-02 LAB — RAPID URINE DRUG SCREEN, HOSP PERFORMED
Amphetamines: NOT DETECTED
Barbiturates: NOT DETECTED
Benzodiazepines: NOT DETECTED
Cocaine: NOT DETECTED
Opiates: NOT DETECTED
Tetrahydrocannabinol: NOT DETECTED

## 2021-06-02 LAB — CBG MONITORING, ED: Glucose-Capillary: 121 mg/dL — ABNORMAL HIGH (ref 70–99)

## 2021-06-02 LAB — I-STAT CHEM 8, ED
BUN: 17 mg/dL (ref 8–23)
Calcium, Ion: 1.2 mmol/L (ref 1.15–1.40)
Chloride: 102 mmol/L (ref 98–111)
Creatinine, Ser: 0.8 mg/dL (ref 0.44–1.00)
Glucose, Bld: 103 mg/dL — ABNORMAL HIGH (ref 70–99)
HCT: 39 % (ref 36.0–46.0)
Hemoglobin: 13.3 g/dL (ref 12.0–15.0)
Potassium: 4 mmol/L (ref 3.5–5.1)
Sodium: 140 mmol/L (ref 135–145)
TCO2: 29 mmol/L (ref 22–32)

## 2021-06-02 LAB — HEMOGLOBIN A1C
Hgb A1c MFr Bld: 5.8 % — ABNORMAL HIGH (ref 4.8–5.6)
Mean Plasma Glucose: 119.76 mg/dL

## 2021-06-02 LAB — PROTIME-INR
INR: 1 (ref 0.8–1.2)
Prothrombin Time: 13.6 seconds (ref 11.4–15.2)

## 2021-06-02 LAB — TROPONIN I (HIGH SENSITIVITY)
Troponin I (High Sensitivity): 4 ng/L (ref <18)
Troponin I (High Sensitivity): 4 ng/L (ref <18)

## 2021-06-02 LAB — URINALYSIS, ROUTINE W REFLEX MICROSCOPIC
Bilirubin Urine: NEGATIVE
Glucose, UA: NEGATIVE mg/dL
Hgb urine dipstick: NEGATIVE
Ketones, ur: NEGATIVE mg/dL
Leukocytes,Ua: NEGATIVE
Nitrite: NEGATIVE
Protein, ur: NEGATIVE mg/dL
Specific Gravity, Urine: 1.03 (ref 1.005–1.030)
pH: 8 (ref 5.0–8.0)

## 2021-06-02 LAB — APTT: aPTT: 26 seconds (ref 24–36)

## 2021-06-02 LAB — RESP PANEL BY RT-PCR (FLU A&B, COVID) ARPGX2
Influenza A by PCR: NEGATIVE
Influenza B by PCR: NEGATIVE
SARS Coronavirus 2 by RT PCR: NEGATIVE

## 2021-06-02 LAB — ETHANOL: Alcohol, Ethyl (B): <10 mg/dL (ref <10)

## 2021-06-02 MED ORDER — CYCLOBENZAPRINE HCL 10 MG PO TABS: 5.0000 mg | ORAL_TABLET | Freq: Three times a day (TID) | ORAL | Status: DC | PRN

## 2021-06-02 MED ORDER — PROPRANOLOL HCL 20 MG PO TABS
10.0000 mg | ORAL_TABLET | Freq: Two times a day (BID) | ORAL | Status: DC
  Filled 2021-06-02: qty 1

## 2021-06-02 MED ORDER — ROSUVASTATIN CALCIUM 20 MG PO TABS: 40.0000 mg | ORAL_TABLET | Freq: Every evening | ORAL | Status: DC

## 2021-06-02 MED ORDER — SERTRALINE HCL 50 MG PO TABS
150.0000 mg | ORAL_TABLET | Freq: Every day | ORAL | Status: DC
  Administered 2021-06-02: 150 mg via ORAL
  Filled 2021-06-02: qty 3

## 2021-06-02 MED ORDER — ACETAMINOPHEN 160 MG/5ML PO SOLN: 650.0000 mg | ORAL | Status: DC | PRN

## 2021-06-02 MED ORDER — STROKE: EARLY STAGES OF RECOVERY BOOK
Freq: Once | Status: AC
  Filled 2021-06-02: qty 1

## 2021-06-02 MED ORDER — IOHEXOL 350 MG/ML SOLN
75.0000 mL | Freq: Once | INTRAVENOUS | Status: AC | PRN
  Administered 2021-06-02: 75 mL via INTRAVENOUS

## 2021-06-02 MED ORDER — RISPERIDONE 0.5 MG PO TABS
0.5000 mg | ORAL_TABLET | Freq: Every day | ORAL | Status: DC
  Administered 2021-06-02: 0.5 mg via ORAL
  Filled 2021-06-02: qty 1

## 2021-06-02 MED ORDER — NITROGLYCERIN 0.4 MG SL SUBL: 0.4000 mg | SUBLINGUAL_TABLET | SUBLINGUAL | Status: DC | PRN

## 2021-06-02 MED ORDER — ACETAMINOPHEN 650 MG RE SUPP: 650.0000 mg | RECTAL | Status: DC | PRN

## 2021-06-02 MED ORDER — SODIUM CHLORIDE 0.9 % IV SOLN: INTRAVENOUS | Status: DC

## 2021-06-02 MED ORDER — SENNOSIDES-DOCUSATE SODIUM 8.6-50 MG PO TABS: 1.0000 | ORAL_TABLET | Freq: Every evening | ORAL | Status: DC | PRN

## 2021-06-02 MED ORDER — DARIFENACIN HYDROBROMIDE ER 7.5 MG PO TB24
15.0000 mg | ORAL_TABLET | Freq: Every day | ORAL | Status: DC
  Administered 2021-06-03: 15 mg via ORAL
  Filled 2021-06-02: qty 2

## 2021-06-02 MED ORDER — TOPIRAMATE 25 MG PO TABS
25.0000 mg | ORAL_TABLET | Freq: Every day | ORAL | Status: DC
  Administered 2021-06-02 – 2021-06-03 (×2): 25 mg via ORAL
  Filled 2021-06-02 (×2): qty 1

## 2021-06-02 MED ORDER — ACETAMINOPHEN 325 MG PO TABS
650.0000 mg | ORAL_TABLET | ORAL | Status: DC | PRN
  Administered 2021-06-03: 650 mg via ORAL
  Filled 2021-06-02: qty 2

## 2021-06-02 MED ORDER — PANTOPRAZOLE SODIUM 40 MG PO TBEC
40.0000 mg | DELAYED_RELEASE_TABLET | Freq: Every day | ORAL | Status: DC
  Administered 2021-06-03: 40 mg via ORAL
  Filled 2021-06-02: qty 1

## 2021-06-02 MED ORDER — ASPIRIN EC 81 MG PO TBEC
81.0000 mg | DELAYED_RELEASE_TABLET | Freq: Every day | ORAL | Status: DC
  Administered 2021-06-03: 81 mg via ORAL
  Filled 2021-06-02: qty 1

## 2021-06-02 MED ORDER — HEPARIN SODIUM (PORCINE) 5000 UNIT/ML IJ SOLN
5000.0000 [IU] | Freq: Three times a day (TID) | INTRAMUSCULAR | Status: DC
  Administered 2021-06-02 – 2021-06-03 (×2): 5000 [IU] via SUBCUTANEOUS
  Filled 2021-06-02 (×2): qty 1

## 2021-06-02 MED ORDER — AMITRIPTYLINE HCL 25 MG PO TABS
50.0000 mg | ORAL_TABLET | Freq: Every day | ORAL | Status: DC
  Administered 2021-06-02: 50 mg via ORAL
  Filled 2021-06-02 (×2): qty 2

## 2021-06-02 NOTE — ED Notes (Signed)
Pt back from CT

## 2021-06-02 NOTE — ED Notes (Addendum)
Pt calls out to report increased stabbing central CP without radiation, EKG repeated

## 2021-06-02 NOTE — H&P (Signed)
History and Physical  [facility]  Kendra [name] ZOX:[id number] DOB: Feb 22, 1946 DOA: 06/02/2021  PCP: Neale Burly, MD  Patient coming from: Home  Level of care: Telemetry  I have personally briefly reviewed patient's old medical records in [place]  Chief Complaint: dysarthria  A qualified Spanish interpreter was used to communicate with the patient  HPI: Kendra [name] is a 75 y.o. female with medical history significant for chronic back pain, chronic chest pain, arthritis, GERD, hyperlipidemia, hypertension, nephrolithiasis, conversion disorder, hypothyroidism, migraine headache who presented to the ED with complaints of difficulty finding words, difficulty speaking and the symptoms have been intermittent.  She became concerned.  She also reports that she has been having intermittent dizziness.  Her symptoms started around 9:00 in the morning today but had an episode last week as well.  Pt also reported no focal weakness or difficulty with gait and no recent fall or fall at home.    ED Course: Code stroke called in ED patient was evaluated and noted to have a CT brain without contrast with no acute findings.  Pt was recommended for acute TIA / CVA work up with MRI brain.  Pt was started on aspirin therapy.  Pt is being admitted to complete work up.   Review of Systems: Review of Systems  Constitutional:  Positive for malaise/fatigue.  HENT: Negative.    Eyes: Negative.   Respiratory: Negative.    Cardiovascular: Negative.   Gastrointestinal: Negative.   Genitourinary: Negative.   Musculoskeletal: Negative.   Skin: Negative.   Neurological:  Positive for speech change and focal weakness. Negative for dizziness, seizures, loss of consciousness and headaches.  Endo/Heme/Allergies: Negative.   All other systems reviewed and are negative.   Past Medical History:  Diagnosis Date   Arthritis    Chronic back pain    Conversion disorder    caused by stress of  daughter's death in 10/08/09   Depression    GERD (gastroesophageal reflux disease)    Hypercholesterolemia    Hypertension    Kidney stone    stones   Migraine    Panic attacks    daily, worse the 15th of each month   Thyroid disease     Past Surgical History:  Procedure Laterality Date   ABDOMINAL HYSTERECTOMY     r ovary removal   APPENDECTOMY     CHOLECYSTECTOMY     CYSTOSCOPY     LEFT HEART CATH AND CORONARY ANGIOGRAPHY N/A 12/26/2017   Procedure: LEFT HEART CATH AND CORONARY ANGIOGRAPHY;  Surgeon: Lorretta Harp, MD;  Location: [place] [facility];  Service: Cardiovascular;  Laterality: N/A;   THYROID SURGERY       reports that she has never smoked. She has never used smokeless tobacco. She reports that she does not drink alcohol and does not use drugs.  Allergies  Allergen Reactions   Hydrocodone Nausea And Vomiting   Morphine Swelling    REACTION: swelling   Pineapple Swelling    Throat swelling    Family History  Problem Relation Age of Onset   Arthritis Mother    Migraines Mother    Cancer Sister    Cancer Maternal Grandmother     Prior to Admission medications   Medication Sig Start Date End Date Taking? Authorizing Provider  amitriptyline (ELAVIL) 50 MG tablet Take 1 tablet (50 mg total) by mouth at bedtime. 03/31/20  Yes Jacelyn Pi, Lilia Argue, MD  clotrimazole-betamethasone (LOTRISONE) cream Apply 1  application topically daily. 05/11/21  Yes [provider]  cyclobenzaprine (FLEXERIL) 5 MG tablet Take 1 tablet (5 mg total) by mouth 3 (three) times daily as needed for muscle spasms. 05/23/21  Yes McKenzie, Candee Furbish, MD  lisinopril (ZESTRIL) 40 MG tablet Take 1 tablet (40 mg total) by mouth daily. 03/31/20  Yes Jacelyn Pi, Irma M, MD  OPTIMAL-D 1.25 MG (50000 UT) capsule Take 1 capsule (50,000 Units total) by mouth once a week. 05/30/20  Yes Just, Laurita Quint, FNP  propranolol (INDERAL) 20 MG tablet Take 1 tablet (20 mg total) by mouth 2 (two) times  daily. 08/29/20  Yes Just, Laurita Quint, FNP  risperiDONE (RISPERDAL) 0.5 MG tablet Take 1 tablet (0.5 mg total) by mouth at bedtime. 03/31/20  Yes Jacelyn Pi, Lilia Argue, MD  rosuvastatin (CRESTOR) 40 MG tablet Take 40 mg by mouth daily. 05/08/21  Yes [provider]  sertraline (ZOLOFT) 50 MG tablet Take 150 mg by mouth at bedtime.   Yes [provider]  solifenacin (VESICARE) 10 MG tablet Take 10 mg by mouth daily.   Yes [provider]  topiramate (TOPAMAX) 25 MG tablet Take 1 tablet by mouth daily.   Yes [provider]  gabapentin (NEURONTIN) 300 MG capsule Take 1 capsule (300 mg total) by mouth daily. Patient not taking: No sig reported 01/18/20   Jacelyn Pi, Lilia Argue, MD  meclizine (ANTIVERT) 25 MG tablet Take 1 tablet (25 mg total) by mouth 3 (three) times daily as needed for dizziness. Patient not taking: No sig reported 05/30/20   Just, Laurita Quint, FNP  Meloxicam 15 MG TBDP Take 1 tablet by mouth daily after breakfast. Patient not taking: No sig reported 08/22/20   Jessy Oto, MD  nitroGLYCERIN (NITROSTAT) 0.4 MG SL tablet Place 1 tablet (0.4 mg total) under the tongue every 5 (five) minutes x 3 doses as needed for chest pain. 12/27/17   Rai, Ripudeep K, MD  pantoprazole (PROTONIX) 40 MG tablet Take 1 tablet (40 mg total) by mouth daily. Patient not taking: No sig reported 05/30/20   Just, Laurita Quint, FNP  rivaroxaban (XARELTO) 20 MG TABS tablet Take 1 tablet (20 mg total) by mouth daily with supper. Please fill this prescription after the first starter pack has been completed. 01/16/18 01/03/19  Mendel Corning, MD    Physical Exam: Vitals:   06/02/21 1200 06/02/21 1230 06/02/21 1300 06/02/21 1330  BP: (!) 153/98 (!) 157/96 (!) 164/98 (!) 163/95  Pulse: 61 80 61   Resp: 20 14 18 15   Temp:      TempSrc:      SpO2: 97% 97% 96%   Weight:        Constitutional: NAD, calm, comfortable, no obvious speech deformities noticed.  Eyes: PERRL, lids and conjunctivae  normal ENMT: Mucous membranes are moist. Posterior pharynx clear of any exudate or lesions.Normal dentition.  Neck: normal, supple, no masses, no thyromegaly Respiratory: clear to auscultation bilaterally, no wheezing, no crackles. Normal respiratory effort. No accessory muscle use.  Cardiovascular: normal s1, s2 sounds, no murmurs / rubs / gallops. No extremity edema. 2+ pedal pulses. No carotid bruits.  Abdomen: no tenderness, no masses palpated. No hepatosplenomegaly. Bowel sounds positive.  Musculoskeletal: no clubbing / cyanosis. No joint deformity upper and lower extremities. Good ROM, no contractures. Normal muscle tone.  Skin: no rashes, lesions, ulcers. No induration Neurologic: CN 2-12 grossly intact. Sensation intact, DTR normal. Strength 5/5 in all 4.  Psychiatric: judgment and insight  unable to determine. Alert and oriented x 3. Normal mood.   Labs on Admission: I have personally reviewed following labs and imaging studies  CBC: Recent Labs  Lab 06/02/21 1030  WBC 6.3  NEUTROABS 4.1  HGB 13.4  HCT 40.3  MCV 99.0  PLT 828   Basic Metabolic Panel: Recent Labs  Lab 06/02/21 1030  NA 139  K 4.7  CL 104  CO2 29  GLUCOSE 110*  BUN 18  CREATININE 0.79  CALCIUM 8.8*   GFR: Estimated Creatinine Clearance: 64.4 mL/min (by C-G formula based on SCr of 0.79 mg/dL). Liver Function Tests: Recent Labs  Lab 06/02/21 1030  AST 28  ALT 18  ALKPHOS 100  BILITOT 1.1  PROT 6.2*  ALBUMIN 3.5   No results for input(s): LIPASE, AMYLASE in the last 168 hours. No results for input(s): AMMONIA in the last 168 hours. Coagulation Profile: Recent Labs  Lab 06/02/21 1030  INR 1.0   Cardiac Enzymes: No results for input(s): CKTOTAL, CKMB, CKMBINDEX, TROPONINI in the last 168 hours. BNP (last 3 results) No results for input(s): PROBNP in the last 8760 hours. HbA1C: No results for input(s): HGBA1C in the last 72 hours. CBG: Recent Labs  Lab 06/02/21 1009  GLUCAP 121*    Lipid Profile: No results for input(s): CHOL, HDL, LDLCALC, TRIG, CHOLHDL, LDLDIRECT in the last 72 hours. Thyroid Function Tests: No results for input(s): TSH, T4TOTAL, FREET4, T3FREE, THYROIDAB in the last 72 hours. Anemia Panel: No results for input(s): VITAMINB12, FOLATE, FERRITIN, TIBC, IRON, RETICCTPCT in the last 72 hours. Urine analysis:    Component Value Date/Time   COLORURINE STRAW (A) 06/02/2021 1205   APPEARANCEUR CLEAR 06/02/2021 1205   APPEARANCEUR Clear 04/26/2021 1015   LABSPEC 1.030 06/02/2021 1205   PHURINE 8.0 06/02/2021 1205   GLUCOSEU NEGATIVE 06/02/2021 1205   HGBUR NEGATIVE 06/02/2021 1205   HGBUR trace-intact 04/27/2009 1109   BILIRUBINUR NEGATIVE 06/02/2021 1205   BILIRUBINUR Present 04/26/2021 Bromide 06/02/2021 1205   PROTEINUR NEGATIVE 06/02/2021 1205   UROBILINOGEN 1.0 03/31/2020 1537   UROBILINOGEN 0.2 03/15/2015 2324   NITRITE NEGATIVE 06/02/2021 1205   LEUKOCYTESUR NEGATIVE 06/02/2021 1205    Radiological Exams on Admission: MR BRAIN WO CONTRAST  Result Date: 06/02/2021 CLINICAL DATA:  TIA, dizziness, chest pain, confusion, difficulty speaking EXAM: MRI HEAD WITHOUT CONTRAST TECHNIQUE: Multiplanar, multiecho pulse sequences of the brain and surrounding structures were obtained without intravenous contrast. COMPARISON:  No prior MRI, correlation is made with CT head 06/02/2021 FINDINGS: Brain: No restricted diffusion to suggest acute infarct. No acute hemorrhage mass, mass effect, or midline shift. T2 hyperintense signal in the periventricular white matter, likely the sequela of chronic small vessel ischemic disease. Vascular: Normal flow voids. Skull and upper cervical spine: Normal marrow signal. Sinuses/Orbits: Mucosal thickening, most prominent in the ethmoid air cells. The orbits are unremarkable. Other: Fluid in the right-greater-than-left mastoid air cells. IMPRESSION: No acute intracranial process. Electronically Signed    By: Merilyn Baba M.D.   On: 06/02/2021 14:21   CT HEAD CODE STROKE WO CONTRAST  Result Date: 06/02/2021 CLINICAL DATA:  Code stroke. Neuro deficit, acute, stroke suspected. Difficulty speaking, confusion, dizziness, and chest pain. EXAM: CT HEAD WITHOUT CONTRAST TECHNIQUE: Contiguous axial images were obtained from the base of the skull through the vertex without intravenous contrast. COMPARISON:  Head CT 04/19/2021 FINDINGS: Brain: There is no evidence of an acute infarct, intracranial hemorrhage, mass, midline shift, or extra-axial fluid collection. The ventricles and  sulci are within normal limits for age. Hypodensities in the cerebral white matter bilaterally are similar to the prior study and nonspecific but compatible with mild chronic small vessel ischemic disease. Vascular: Calcified atherosclerosis at the skull base. No hyperdense vessel. Skull: No fracture or suspicious osseous lesion. Sinuses/Orbits: Mild mucosal thickening in the paranasal sinuses. No significant mastoid fluid. Unremarkable orbits. Other: None. ASPECTS [facility] Stroke Program Early CT Score) - Ganglionic level infarction (caudate, lentiform nuclei, internal capsule, insula, M1-M3 cortex): 7 - Supraganglionic infarction (M4-M6 cortex): 3 Total score (0-10 with 10 being normal): 10 IMPRESSION: 1. No evidence of acute intracranial abnormality. ASPECTS of 10. 2. Mild chronic small vessel ischemic disease. These results were called by telephone at the time of interpretation on 06/02/2021 at 10:24 am to Dr. Davonna Belling, who verbally acknowledged these results. Electronically Signed   By: Logan Bores M.D.   On: 06/02/2021 10:24   CT ANGIO HEAD NECK W WO CM (CODE STROKE)  Result Date: 06/02/2021 CLINICAL DATA:  Neck trauma, focal neuro deficit or paresthesia. Additional history provided: Dizziness, chest pain, confusion, difficulty speaking, symptoms on Sunday and again this morning. EXAM: CT ANGIOGRAPHY HEAD AND NECK TECHNIQUE:  Multidetector CT imaging of the head and neck was performed using the standard protocol during bolus administration of intravenous contrast. Multiplanar CT image reconstructions and MIPs were obtained to evaluate the vascular anatomy. Carotid stenosis measurements (when applicable) are obtained utilizing NASCET criteria, using the distal internal carotid diameter as the denominator. CONTRAST:  70mL OMNIPAQUE IOHEXOL 350 MG/ML SOLN COMPARISON:  Noncontrast head CT performed earlier today 06/02/2021. FINDINGS: CTA NECK FINDINGS Aortic arch: Standard aortic branching. Mild calcified plaque within the proximal major branch vessels of the neck. Streak and beam hardening artifact arising from a dense right-sided contrast bolus partially obscures the right subclavian artery. Within this limitation, there is no appreciable hemodynamically significant innominate or proximal subclavian artery stenosis. Right carotid system: CCA and ICA patent within the neck without stenosis. Mild calcified plaque within the proximal ICA. Left carotid system: CCA and ICA patent within the neck without stenosis. Mild soft and calcified plaque within the proximal ICA. Vertebral arteries: Vertebral arteries codominant and patent within the neck without stenosis. Minimal nonstenotic calcified plaque at the origin of the right vertebral artery. Skeleton: No acute bony abnormality or aggressive osseous lesion. Multilevel ventrolateral osteophytes within the imaged upper thoracic spine. Other neck: No neck mass or cervical lymphadenopathy. Upper chest: No consolidation within the imaged lung apices. Review of the MIP images confirms the above findings CTA HEAD FINDINGS Anterior circulation: The intracranial internal carotid arteries are patent. Calcified plaque within both vessels without stenosis. The M1 middle cerebral arteries are patent. No M2 proximal branch occlusion or high-grade proximal stenosis is identified. Mild to moderate stenosis  within a mid M2 right MCA vessel (series 12, image 16). The anterior cerebral arteries are patent. No intracranial aneurysm is identified. Posterior circulation: The intracranial vertebral arteries are patent. The basilar artery is patent. The posterior cerebral arteries are patent. Posterior communicating arteries are diminutive or absent bilaterally. Venous sinuses: Within the limitations of contrast timing, no convincing thrombus. Anatomic variants: As described Review of the MIP images confirms the above findings No emergent large vessel occlusion. These results were called by telephone at the time of interpretation on 06/02/2021 at 11:01 am to provider [facility] , who verbally acknowledged these results. IMPRESSION: CTA neck: The common carotid, internal carotid and vertebral arteries are patent within the neck without stenosis. Mild  atherosclerotic plaque within the proximal major branch vessels of the neck, proximal internal carotid arteries and at the origin of the right vertebral artery. CTA head: 1. No intracranial large vessel occlusion or proximal high-grade arterial stenosis. 2. 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 3. Mild non-stenotic calcified plaque within the intracranial internal carotid arteries. Electronically Signed   By: Kellie Simmering D.O.   On: 06/02/2021 11:03    EKG: Independently reviewed.   Assessment/Plan Principal Problem:   TIA (transient ischemic attack) Active Problems:   HYPERPARATHYROIDISM UNSPECIFIED   Dyslipidemia   Migraine headache   HYPERTENSION, BENIGN ESSENTIAL   Allergic rhinitis   GERD   OSA on CPAP   Morbid obesity (HCC)   Chest pain   History of DVT (deep vein thrombosis)   Conversion disorder    Working diagnosis of TIA with dysarthria  - admit for telemetry observation - TTE  - continue aspirin 81 mg daily - MRI brain without contrast - CTA with no findings of LVO - PT/OT/SLP evaluation  - permissive  hypertension for 48 hours  Chest pain - noncardiac - HS troponin x 2 very reassuring   DVT prophylaxis: SQ heparin  Code Status: full   Family Communication: husband bedside   Disposition Plan: home with [facility]   Consults called: neurology   Admission status: OBV  Level of care: Telemetry Lacharles Altschuler MD [facility] How to contact the [facility] Attending or Consulting provider [place] or covering provider during after hours [place], for this patient?  Check the care team in [facility] and look for a) attending/consulting [facility] provider listed and b) the [facility] team listed Log into [email or web address] and use [place]'s universal password to access. If you do not have the password, please contact the hospital operator. Locate the [facility] provider you are looking for under [facility] and page to a number that you can be directly reached. If you still have difficulty reaching the provider, please page the [facility] (Director on Call) for the Hospitalists listed on amion for assistance.   If 7PM-7AM, please contact night-coverage [email or web address] Password [email or web address]  06/02/2021, 3:12 PM

## 2021-06-02 NOTE — Progress Notes (Signed)
1006 call time 1007 beeper time 1014 exam started 1017 exam finished 1017 images sent to soc 1018 exam completed in epic 1017 [place] radiology called.

## 2021-06-02 NOTE — ED Provider Notes (Addendum)
[facility] EMERGENCY DEPARTMENT Provider Note   CSN: [id number] Arrival date & time: 06/02/21  5400  An emergency department physician performed an initial assessment on this suspected stroke patient at 39.  History Chief Complaint  Patient presents with   Chest Pain   Code Stroke    Kendra [name] is a 75 y.o. female. Level 5 caveat due to difficulty speaking.  Chest Pain Came in with difficulty speaking and some chest pain.  Difficulty speaking began around 9:00 today although did have another episode last week.  Reportedly think she is dizzy.  Difficulty getting out speech for me and difficult to get a history from.  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  Reportedly has been dealing with a lot of stress recently.  Had previously been on anticoagulation but not currently on it.    Past Medical History:  Diagnosis Date   Arthritis    Chronic back pain    Conversion disorder    caused by stress of daughter's death in 10/15/2009   Depression    GERD (gastroesophageal reflux disease)    Hypercholesterolemia    Hypertension    Kidney stone    stones   Migraine    Panic attacks    daily, worse the 15th of each month   Thyroid disease     Patient Active Problem List   Diagnosis Date Noted   Recurrent major depressive disorder, in partial remission ([place]) 08/29/2020   History of DVT (deep vein thrombosis) 12/26/2017   Acute pulmonary embolism without acute cor pulmonale ([place])    Chest pain 12/25/2017   Essential hypertension 12/25/2017   Mixed hyperlipidemia 12/25/2017   Chest pain of uncertain etiology    ABSCESS, TOOTH 09/06/2010   Morbid obesity ([place]) 07/12/2010   CHOLECYSTECTOMY, HX OF 04/25/2010   Depression 03/17/2010   HYPERTENSION, BENIGN ESSENTIAL 10/28/2009   ALLERGIC RHINITIS 10/12/2009   OSA on CPAP 10/12/2009   HYPERPARATHYROIDISM UNSPECIFIED 07/19/2009   KNEE PAIN, BILATERAL 01/11/2009   LABYRINTHITIS, ACUTE 09/29/2008   BACK PAIN, LUMBAR  08/03/2008   CALCANEAL SPUR, RIGHT 07/14/2008   OSTEOPENIA 07/14/2008   NEPHROLITHIASIS 03/18/2008   GASTROENTERITIS, ACUTE 01/08/2008   HEMATURIA UNSPECIFIED 01/08/2008   TEMPOROMANDIBULAR JOINT PAIN 07/09/2007   Dyslipidemia 03/07/2007   GERD 03/07/2007   OSTEOARTHROSIS, LOCAL, PRIMARY, LOWER LEG 03/07/2007   MIGRAINE HEADACHE 03/03/2007   CERVICAL MUSCLE STRAIN 86/76/1950   HELICOBACTER PYLORI INFECTION, HX OF 12/21/2005    Past Surgical History:  Procedure Laterality Date   ABDOMINAL HYSTERECTOMY     r ovary removal   APPENDECTOMY     CHOLECYSTECTOMY     CYSTOSCOPY     LEFT HEART CATH AND CORONARY ANGIOGRAPHY N/A 12/26/2017   Procedure: LEFT HEART CATH AND CORONARY ANGIOGRAPHY;  Surgeon: Lorretta Harp, MD;  Location: [facility];  Service: Cardiovascular;  Laterality: N/A;   THYROID SURGERY       OB History   No obstetric history on file.     Family History  Problem Relation Age of Onset   Arthritis Mother    Migraines Mother    Cancer Sister    Cancer Maternal Grandmother     Social History   Tobacco Use   Smoking status: Never   Smokeless tobacco: Never  Vaping Use   Vaping Use: Never used  Substance Use Topics   Alcohol use: No   Drug use: No    Home Medications Prior to Admission medications   Medication Sig  Start Date End Date Taking? Authorizing Provider  amitriptyline (ELAVIL) 50 MG tablet Take 1 tablet (50 mg total) by mouth at bedtime. 03/31/20  Yes Jacelyn Pi, Lilia Argue, MD  clotrimazole-betamethasone (LOTRISONE) cream Apply 1 application topically daily. 05/11/21  Yes [provider]  cyclobenzaprine (FLEXERIL) 5 MG tablet Take 1 tablet (5 mg total) by mouth 3 (three) times daily as needed for muscle spasms. 05/23/21  Yes McKenzie, Candee Furbish, MD  lisinopril (ZESTRIL) 40 MG tablet Take 1 tablet (40 mg total) by mouth daily. 03/31/20  Yes Jacelyn Pi, Irma M, MD  OPTIMAL-D 1.25 MG (50000 UT) capsule Take 1 capsule (50,000 Units  total) by mouth once a week. 05/30/20  Yes Just, Laurita Quint, FNP  propranolol (INDERAL) 20 MG tablet Take 1 tablet (20 mg total) by mouth 2 (two) times daily. 08/29/20  Yes Just, Laurita Quint, FNP  risperiDONE (RISPERDAL) 0.5 MG tablet Take 1 tablet (0.5 mg total) by mouth at bedtime. 03/31/20  Yes Jacelyn Pi, Lilia Argue, MD  rosuvastatin (CRESTOR) 40 MG tablet Take 40 mg by mouth daily. 05/08/21  Yes [provider]  sertraline (ZOLOFT) 50 MG tablet Take 150 mg by mouth at bedtime.   Yes [provider]  solifenacin (VESICARE) 10 MG tablet Take 10 mg by mouth daily.   Yes [provider]  topiramate (TOPAMAX) 25 MG tablet Take 1 tablet by mouth daily.   Yes [provider]  gabapentin (NEURONTIN) 300 MG capsule Take 1 capsule (300 mg total) by mouth daily. Patient not taking: No sig reported 01/18/20   Jacelyn Pi, Lilia Argue, MD  meclizine (ANTIVERT) 25 MG tablet Take 1 tablet (25 mg total) by mouth 3 (three) times daily as needed for dizziness. Patient not taking: No sig reported 05/30/20   Just, Laurita Quint, FNP  Meloxicam 15 MG TBDP Take 1 tablet by mouth daily after breakfast. Patient not taking: No sig reported 08/22/20   Jessy Oto, MD  nitroGLYCERIN (NITROSTAT) 0.4 MG SL tablet Place 1 tablet (0.4 mg total) under the tongue every 5 (five) minutes x 3 doses as needed for chest pain. 12/27/17   Rai, Ripudeep K, MD  pantoprazole (PROTONIX) 40 MG tablet Take 1 tablet (40 mg total) by mouth daily. Patient not taking: No sig reported 05/30/20   Just, Laurita Quint, FNP  rivaroxaban (XARELTO) 20 MG TABS tablet Take 1 tablet (20 mg total) by mouth daily with supper. Please fill this prescription after the first starter pack has been completed. 01/16/18 01/03/19  Rai, Vernelle Emerald, MD    Allergies    Hydrocodone, Morphine, and Pineapple  Review of Systems   Review of Systems  Unable to perform ROS: Mental status change  Cardiovascular:  Positive for chest pain.   Physical  Exam Updated Vital Signs BP (!) 164/98   Pulse 61   Temp 98.2 F (36.8 C) (Oral)   Resp 18   Wt 94.6 kg   SpO2 96%   BMI 36.94 kg/m   Physical Exam Vitals and nursing note reviewed.  Eyes:     Pupils: Pupils are equal, round, and reactive to light.  Cardiovascular:     Rate and Rhythm: Regular rhythm.  Pulmonary:     Breath sounds: No decreased breath sounds, wheezing, rhonchi or rales.  Chest:     Chest wall: No tenderness.  Abdominal:     Tenderness: There is no abdominal tenderness.  Musculoskeletal:     Right lower leg: No edema.  Left lower leg: No edema.  Skin:    General: Skin is warm.  Neurological:     Mental Status: She is alert.     Comments: Eye movements intact.  Stuttering speech.  Difficulty getting out words.  Good finger-nose on right.  Variable on left.  Sometimes would not even point at where she is supposed to.    ED Results / Procedures / Treatments   Labs (all labs ordered are listed, but only abnormal results are displayed) Labs Reviewed  COMPREHENSIVE METABOLIC PANEL - Abnormal; Notable for the following components:      Result Value   Glucose, Bld 110 (*)    Calcium 8.8 (*)    Total Protein 6.2 (*)    All other components within normal limits  URINALYSIS, ROUTINE W REFLEX MICROSCOPIC - Abnormal; Notable for the following components:   Color, Urine STRAW (*)    All other components within normal limits  CBG MONITORING, ED - Abnormal; Notable for the following components:   Glucose-Capillary 121 (*)    All other components within normal limits  RESP PANEL BY RT-PCR (FLU A&B, COVID) ARPGX2  PROTIME-INR  APTT  CBC  DIFFERENTIAL  RAPID URINE DRUG SCREEN, HOSP PERFORMED  ETHANOL  I-STAT CHEM 8, ED  TROPONIN I (HIGH SENSITIVITY)  TROPONIN I (HIGH SENSITIVITY)    EKG EKG Interpretation  Date/Time:  Friday June 02 2021 10:04:10 EST Ventricular Rate:  77 PR Interval:  174 QRS Duration: 81 QT Interval:  387 QTC  Calculation: 438 R Axis:   59 Text Interpretation: Sinus rhythm Consider left atrial enlargement RSR' in V1 or V2, probably normal variant Minimal ST elevation, anterior leads Confirmed by Davonna Belling [phone]) on 06/02/2021 10:47:06 AM  Radiology CT HEAD CODE STROKE WO CONTRAST  Result Date: 06/02/2021 CLINICAL DATA:  Code stroke. Neuro deficit, acute, stroke suspected. Difficulty speaking, confusion, dizziness, and chest pain. EXAM: CT HEAD WITHOUT CONTRAST TECHNIQUE: Contiguous axial images were obtained from the base of the skull through the vertex without intravenous contrast. COMPARISON:  Head CT 04/19/2021 FINDINGS: Brain: There is no evidence of an acute infarct, intracranial hemorrhage, mass, midline shift, or extra-axial fluid collection. The ventricles and sulci are within normal limits for age. Hypodensities in the cerebral white matter bilaterally are similar to the prior study and nonspecific but compatible with mild chronic small vessel ischemic disease. Vascular: Calcified atherosclerosis at the skull base. No hyperdense vessel. Skull: No fracture or suspicious osseous lesion. Sinuses/Orbits: Mild mucosal thickening in the paranasal sinuses. No significant mastoid fluid. Unremarkable orbits. Other: None. ASPECTS [facility] Stroke Program Early CT Score) - Ganglionic level infarction (caudate, lentiform nuclei, internal capsule, insula, M1-M3 cortex): 7 - Supraganglionic infarction (M4-M6 cortex): 3 Total score (0-10 with 10 being normal): 10 IMPRESSION: 1. No evidence of acute intracranial abnormality. ASPECTS of 10. 2. Mild chronic small vessel ischemic disease. These results were called by telephone at the time of interpretation on 06/02/2021 at 10:24 am to Dr. Davonna Belling, who verbally acknowledged these results. Electronically Signed   By: Logan Bores M.D.   On: 06/02/2021 10:24   CT ANGIO HEAD NECK W WO CM (CODE STROKE)  Result Date: 06/02/2021 CLINICAL DATA:  Neck trauma,  focal neuro deficit or paresthesia. Additional history provided: Dizziness, chest pain, confusion, difficulty speaking, symptoms on Sunday and again this morning. EXAM: CT ANGIOGRAPHY HEAD AND NECK TECHNIQUE: Multidetector CT imaging of the head and neck was performed using the standard protocol during bolus administration of intravenous contrast. Multiplanar  CT image reconstructions and MIPs were obtained to evaluate the vascular anatomy. Carotid stenosis measurements (when applicable) are obtained utilizing NASCET criteria, using the distal internal carotid diameter as the denominator. CONTRAST:  66mL OMNIPAQUE IOHEXOL 350 MG/ML SOLN COMPARISON:  Noncontrast head CT performed earlier today 06/02/2021. FINDINGS: CTA NECK FINDINGS Aortic arch: Standard aortic branching. Mild calcified plaque within the proximal major branch vessels of the neck. Streak and beam hardening artifact arising from a dense right-sided contrast bolus partially obscures the right subclavian artery. Within this limitation, there is no appreciable hemodynamically significant innominate or proximal subclavian artery stenosis. Right carotid system: CCA and ICA patent within the neck without stenosis. Mild calcified plaque within the proximal ICA. Left carotid system: CCA and ICA patent within the neck without stenosis. Mild soft and calcified plaque within the proximal ICA. Vertebral arteries: Vertebral arteries codominant and patent within the neck without stenosis. Minimal nonstenotic calcified plaque at the origin of the right vertebral artery. Skeleton: No acute bony abnormality or aggressive osseous lesion. Multilevel ventrolateral osteophytes within the imaged upper thoracic spine. Other neck: No neck mass or cervical lymphadenopathy. Upper chest: No consolidation within the imaged lung apices. Review of the MIP images confirms the above findings CTA HEAD FINDINGS Anterior circulation: The intracranial internal carotid arteries are patent.  Calcified plaque within both vessels without stenosis. The M1 middle cerebral arteries are patent. No M2 proximal branch occlusion or high-grade proximal stenosis is identified. Mild to moderate stenosis within a mid M2 right MCA vessel (series 12, image 16). The anterior cerebral arteries are patent. No intracranial aneurysm is identified. Posterior circulation: The intracranial vertebral arteries are patent. The basilar artery is patent. The posterior cerebral arteries are patent. Posterior communicating arteries are diminutive or absent bilaterally. Venous sinuses: Within the limitations of contrast timing, no convincing thrombus. Anatomic variants: As described Review of the MIP images confirms the above findings No emergent large vessel occlusion. These results were called by telephone at the time of interpretation on 06/02/2021 at 11:01 am to provider [facility] , who verbally acknowledged these results. IMPRESSION: CTA neck: The common carotid, internal carotid and vertebral arteries are patent within the neck without stenosis. Mild atherosclerotic plaque within the proximal major branch vessels of the neck, proximal internal carotid arteries and at the origin of the right vertebral artery. CTA head: 1. No intracranial large vessel occlusion or proximal high-grade arterial stenosis. 2. 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 3. Mild non-stenotic calcified plaque within the intracranial internal carotid arteries. Electronically Signed   By: Kellie Simmering D.O.   On: 06/02/2021 11:03    Procedures Procedures   Medications Ordered in ED Medications  iohexol (OMNIPAQUE) 350 MG/ML injection 75 mL (75 mLs Intravenous Contrast Given 06/02/21 1032)    ED Course  I have reviewed the triage vital signs and the nursing notes.  Pertinent labs & imaging results that were available during my care of the patient were reviewed by me and considered in my medical decision making (see  chart for details).    MDM Rules/Calculators/A&P                           Patient brought in for mental status changes.  Difficulty speaking.  Somewhat variable exam.  Per neurology may have had a question of some GI bleeding previously.  Labs reassuring.  EKG reassuring.  CT CTA reassuring.  May be of a nonorganic cause.  However  neurology requests admission for MRI and further evaluation.  Will discuss with hospitalist.  Not a thrombolytic candidate due to variable exam.  On reexamination but not completely normalized.  Better speech.  Better movement of bilateral extremities Final Clinical Impression(s) / ED Diagnoses Final diagnoses:  Weakness    Rx / DC Orders ED Discharge Orders     None        Davonna Belling, MD 06/02/21 1321    Davonna Belling, MD 06/02/21 1322

## 2021-06-02 NOTE — ED Notes (Signed)
Attempting to take BP

## 2021-06-02 NOTE — Consult Note (Addendum)
NEUROLOGY TELECONSULTATION NOTE   Date of service: June 02, 2021 Patient Name: Kendra [name] MRN:  [id number] DOB:  04-16-1946 Reason for consult: telestroke  Requesting Provider: Dr. Davonna Belling Consult Participants: myself, patient, patient's husband, bedside RN, telestroke RN Location of the provider: [facility] Location of the patient: [facility]  This consult was provided via telemedicine with 2-way video and audio communication. The patient/family was informed that care would be provided in this way and agreed to receive care in this manner.   Patient is Spanish-speaking, translation performed using remote translator with ipad at bedside  _ _ _   _ __   _ __ _ _  __ __   _ __   __ _  History of Present Illness   This is a 75 yo woman with hx HTN, HL, conversion disorder, panic attacks, possible recent GIB who presents with dizziness, difficulty speaking, chest pain since this AM. LKW 0900. Husband reports patient had similar sx last 10/13/22 but that she was asymptomatic in between. Chest pain is chronic and is not different today than most days. Patient has a history of multiple presentations of syncope or presyncope, sometimes with chest pain, dizziness, dx as panic attacks and conversion disorder 2/2 grief reaction several years ago but none recently at least in our hospital system. Husband says patient is being worked up for possible GIB as outpatient, unclear sx that precipitated this, but she was told she might be bleeding in her stomach within the past week and was given cards to guaiac her stool.  CT head NAICP.  NIHSS = 7 however symptoms were not in a vascular distribution and exam was highly functional (giveway weakness with drift in 3 extremities, stuttering, big circular movements without ataxia on FNF bilat) and suspicion for stroke was low. In addition patient had relative contraindication of recent GIB therefore for both reasons TNK was not administered. CTA H&N showed no LVO.  CNS imaging personally reviewed.   ROS   Per HPI; all other systems reviewed and are negative  Past History   The following was personally reviewed:  Past Medical History:  Diagnosis Date   Arthritis    Chronic back pain    Conversion disorder    caused by stress of daughter's death in 10-12-2009   Depression    GERD (gastroesophageal reflux disease)    Hypercholesterolemia    Hypertension    Kidney stone    stones   Migraine    Panic attacks    daily, worse the 15th of each month   Thyroid disease    Past Surgical History:  Procedure Laterality Date   ABDOMINAL HYSTERECTOMY     r ovary removal   APPENDECTOMY     CHOLECYSTECTOMY     CYSTOSCOPY     LEFT HEART CATH AND CORONARY ANGIOGRAPHY N/A 12/26/2017   Procedure: LEFT HEART CATH AND CORONARY ANGIOGRAPHY;  Surgeon: Lorretta Harp, MD;  Location: [facility];  Service: Cardiovascular;  Laterality: N/A;   THYROID SURGERY     Family History  Problem Relation Age of Onset   Arthritis Mother    Migraines Mother    Cancer Sister    Cancer Maternal Grandmother    Social History   Socioeconomic History   Marital status: Married    Spouse name: Not on file   Number of children: Not on file   Years of education: Not on file   Highest education level: Not on file  Occupational History  Not on file  Tobacco Use   Smoking status: Never   Smokeless tobacco: Never  Vaping Use   Vaping Use: Never used  Substance and Sexual Activity   Alcohol use: No   Drug use: No   Sexual activity: Never    Birth control/protection: None  Other Topics Concern   Not on file  Social History Narrative   Not on file   Social Determinants of Health   Financial Resource Strain: Not on file  Food Insecurity: Not on file  Transportation Needs: Not on file  Physical Activity: Not on file  Stress: Not on file  Social Connections: Not on file   Allergies  Allergen Reactions   Hydrocodone Nausea And Vomiting   Morphine  Swelling    REACTION: swelling   Pineapple Swelling    Throat swelling    Medications   (Not in a hospital admission)     Current Facility-Administered Medications:    iohexol (OMNIPAQUE) 350 MG/ML injection 75 mL, 75 mL, Intravenous, Once PRN, Davonna Belling, MD  Current Outpatient Medications:    amitriptyline (ELAVIL) 50 MG tablet, Take 1 tablet (50 mg total) by mouth at bedtime., Disp: 90 tablet, Rfl: 1   cyclobenzaprine (FLEXERIL) 5 MG tablet, Take 1 tablet (5 mg total) by mouth 3 (three) times daily as needed for muscle spasms., Disp: 30 tablet, Rfl: 0   gabapentin (NEURONTIN) 300 MG capsule, Take 1 capsule (300 mg total) by mouth daily., Disp: 90 capsule, Rfl: 1   lisinopril (ZESTRIL) 40 MG tablet, Take 1 tablet (40 mg total) by mouth daily., Disp: 90 tablet, Rfl: 1   meclizine (ANTIVERT) 25 MG tablet, Take 1 tablet (25 mg total) by mouth 3 (three) times daily as needed for dizziness., Disp: 30 tablet, Rfl: 3   Meloxicam 15 MG TBDP, Take 1 tablet by mouth daily after breakfast., Disp: 30 tablet, Rfl: 3   nitroGLYCERIN (NITROSTAT) 0.4 MG SL tablet, Place 1 tablet (0.4 mg total) under the tongue every 5 (five) minutes x 3 doses as needed for chest pain., Disp: 30 tablet, Rfl: 12   OPTIMAL-D 1.25 MG (50000 UT) capsule, Take 1 capsule (50,000 Units total) by mouth once a week., Disp: 12 capsule, Rfl: 3   pantoprazole (PROTONIX) 40 MG tablet, Take 1 tablet (40 mg total) by mouth daily., Disp: 90 tablet, Rfl: 3   pravastatin (PRAVACHOL) 20 MG tablet, Take 20 mg by mouth daily., Disp: , Rfl:    propranolol (INDERAL) 20 MG tablet, Take 1 tablet (20 mg total) by mouth 2 (two) times daily., Disp: 180 tablet, Rfl: 1   risperiDONE (RISPERDAL) 0.5 MG tablet, Take 1 tablet (0.5 mg total) by mouth at bedtime., Disp: 90 tablet, Rfl: 1   sertraline (ZOLOFT) 50 MG tablet, Take 50 mg by mouth at bedtime., Disp: , Rfl:    solifenacin (VESICARE) 10 MG tablet, Take 10 mg by mouth daily., Disp: ,  Rfl:    topiramate (TOPAMAX) 25 MG tablet, Take 1 tablet by mouth daily., Disp: , Rfl:   Vitals   Vitals:   06/02/21 1007 06/02/21 1009 06/02/21 1010 06/02/21 1015  BP:  125/70 125/71 115/62  Pulse: 76 75 73 73  Resp: 16 17 16 19   Temp:      TempSrc:      SpO2: 97% 97% 97% 97%     There is no height or weight on file to calculate BMI.  Physical Exam   Exam performed over telemedicine with 2-way video and audio communication  and with assistance of bedside RN  Physical Exam Gen: oriented to self, husband, not age, not month Resp: normal WOB CV: extremities appear well-perfused  Neuro: *MS: oriented to self, husband, not age, not month. Follows simple commands *Speech: mild dysarthria, marked stuttering, no aphasia, able to name and repeat *CN: PERRL 94mm, EOMI, VFF by confrontation, sensation intact, smile symmetric, hearing intact to voice *Motor:   Normal bulk.  No tremor, rigidity or bradykinesia. Drift in LUE and BLE with giveway weakness, RUE strength appears intact. *Sensory: SILT. Symmetric. No double-simultaneous extinction.  *Coordination:  wide circular jerking movements on FNF bilaterally without ataxia *Reflexes:  UTA 2/2 tele-exam *Gait: deferred  NIHSS  1a Level of Conscious.: 0 1b LOC Questions: 2 1c LOC Commands: 0 2 Best Gaze: 0 3 Visual: 0 4 Facial Palsy: 0 5a Motor Arm - left: 1 5b Motor Arm - Right: 0 6a Motor Leg - Left: 1 6b Motor Leg - Right: 1 7 Limb Ataxia: 0 8 Sensory: 1 9 Best Language: 0 10 Dysarthria: 1 11 Extinct. and Inatten.: 0  TOTAL: 7  Premorbid mRS = 3   Labs   CBC: No results for input(s): WBC, NEUTROABS, HGB, HCT, MCV, PLT in the last 168 hours.  Basic Metabolic Panel:  Lab Results  Component Value Date   NA 138 08/29/2020   K 4.8 08/29/2020   CO2 26 08/29/2020   GLUCOSE 81 08/29/2020   BUN 17 08/29/2020   CREATININE 0.73 08/29/2020   CALCIUM 9.5 08/29/2020   GFRNONAA 80 08/29/2020   GFRAA 93 08/29/2020    Lipid Panel:  Lab Results  Component Value Date   LDLCALC 227 (H) 08/29/2020   HgbA1c:  Lab Results  Component Value Date   HGBA1C  06/28/2007    5.8 (NOTE)   The ADA recommends the following therapeutic goals for glycemic   control related to Hgb A1C measurement:   Goal of Therapy:   < 7.0% Hgb A1C   Action Suggested:  > 8.0% Hgb A1C   Ref:  Diabetes Care, 22, Suppl. 1, 1999   Urine Drug Screen: No results found for: LABOPIA, COCAINSCRNUR, LABBENZ, AMPHETMU, THCU, LABBARB  Alcohol Level     Component Value Date/Time   ETH <10 11/22/2019 1737     Impression   This is a 75 yo woman with hx HTN, HL, conversion disorder, panic attacks, possible recent GIB who presents with dizziness, difficulty speaking, chest pain since this AM. She was not given TNK 2/2 c/f GIB earlier this week and functional exam w/ lower suspicion for stroke. I can't rule out that she does have a small functional deficit that is embellished on exam so I do recommend admission obs for workup of TIA or small stroke.  Recommendations   - Admit to hospitalist service for TIA/stroke workup - Permissive HTN x48 hrs from sx onset or until stroke ruled out by MRI goal BP <220/110. PRN labetalol or hydralazine if BP above these parameters. Avoid oral antihypertensives. - MRI brain wo contrast - CTA or MRA H&N - TTE no bubble - Check A1c and LDL + add statin per guidelines - ASA 325mg  now f/b 81mg  daily - q4 hr neuro checks - STAT head CT for any change in neuro exam - Tele - PT/OT/SLP - Stroke education - Amb referral to neurology upon discharge   Page me for neurology questions today; if she requires neurology f/u after that please consult Dr. Hortense Ramal [facility].  ______________________________________________________________________   Thank you  for the opportunity to take part in the care of this patient. If you have any further questions, please contact the neurology consultation  attending.  Signed,  Su Monks, MD [facility] [phone]  If 7pm- 7am, please page neurology on call as listed in [place].

## 2021-06-02 NOTE — ED Triage Notes (Signed)
Pt brought to ED by husband for dizziness, chest pain, confusion, and difficulty speaking, started Sunday then started again this morning. Husband states she was normal at 0800 but difficulty speaking and chest pain started at 0900. LKW 0900

## 2021-06-03 ENCOUNTER — Observation Stay (HOSPITAL_BASED_OUTPATIENT_CLINIC_OR_DEPARTMENT_OTHER): Payer: Medicare HMO

## 2021-06-03 ENCOUNTER — Encounter (HOSPITAL_COMMUNITY): Payer: Self-pay | Admitting: Family Medicine

## 2021-06-03 DIAGNOSIS — I10 Essential (primary) hypertension: Secondary | ICD-10-CM | POA: Diagnosis not present

## 2021-06-03 DIAGNOSIS — Z9989 Dependence on other enabling machines and devices: Secondary | ICD-10-CM | POA: Diagnosis not present

## 2021-06-03 DIAGNOSIS — E785 Hyperlipidemia, unspecified: Secondary | ICD-10-CM | POA: Diagnosis not present

## 2021-06-03 DIAGNOSIS — G459 Transient cerebral ischemic attack, unspecified: Secondary | ICD-10-CM | POA: Diagnosis not present

## 2021-06-03 DIAGNOSIS — K219 Gastro-esophageal reflux disease without esophagitis: Secondary | ICD-10-CM

## 2021-06-03 DIAGNOSIS — G4733 Obstructive sleep apnea (adult) (pediatric): Secondary | ICD-10-CM | POA: Diagnosis not present

## 2021-06-03 DIAGNOSIS — F449 Dissociative and conversion disorder, unspecified: Secondary | ICD-10-CM

## 2021-06-03 DIAGNOSIS — I200 Unstable angina: Secondary | ICD-10-CM | POA: Diagnosis not present

## 2021-06-03 LAB — ECHOCARDIOGRAM COMPLETE
AR max vel: 2.94 cm2
AV Area VTI: 2.81 cm2
AV Area mean vel: 2.49 cm2
AV Mean grad: 4 mmHg
AV Peak grad: 6.1 mmHg
Ao pk vel: 1.23 m/s
Area-P 1/2: 2.88 cm2
Calc EF: 56.2 %
Height: 63 in
MV VTI: 4.23 cm2
S' Lateral: 2.6 cm
Single Plane A2C EF: 61.9 %
Single Plane A4C EF: 54 %
Weight: 3174.62 oz

## 2021-06-03 LAB — LIPID PANEL
Cholesterol: 174 mg/dL (ref 0–200)
HDL: 46 mg/dL (ref >40)
LDL Cholesterol: 97 mg/dL (ref 0–99)
Total CHOL/HDL Ratio: 3.8 RATIO
Triglycerides: 153 mg/dL — ABNORMAL HIGH (ref <150)
VLDL: 31 mg/dL (ref 0–40)

## 2021-06-03 MED ORDER — PROPRANOLOL HCL 20 MG PO TABS: 10.0000 mg | ORAL_TABLET | Freq: Two times a day (BID) | ORAL | 1 refills | Status: DC

## 2021-06-03 MED ORDER — ENSURE ENLIVE PO LIQD
237.0000 mL | Freq: Two times a day (BID) | ORAL | Status: DC
Start: 2021-06-03 — End: 2021-06-03
  Administered 2021-06-03: 237 mL via ORAL

## 2021-06-03 MED ORDER — ADULT MULTIVITAMIN W/MINERALS CH
1.0000 | ORAL_TABLET | Freq: Every day | ORAL | Status: DC
Start: 2021-06-03 — End: 2021-06-03
  Administered 2021-06-03: 1 via ORAL
  Filled 2021-06-03: qty 1

## 2021-06-03 MED ORDER — ASPIRIN 81 MG PO TBEC
81.0000 mg | DELAYED_RELEASE_TABLET | Freq: Every day | ORAL | 1 refills | Status: DC
Start: 2021-06-04 — End: 2023-02-12

## 2021-06-03 MED ORDER — PANTOPRAZOLE SODIUM 40 MG PO TBEC: 40.0000 mg | DELAYED_RELEASE_TABLET | Freq: Every day | ORAL | 1 refills | Status: DC

## 2021-06-03 NOTE — Evaluation (Signed)
Physical Therapy Evaluation Patient Details Name: Kendra [name] MRN: [id number] DOB: Nov 29, 1945 Today's Date: 06/03/2021  History of Present Illness  Kendra [name] is a 75 y.o. female with medical history significant for chronic back pain, chronic chest pain, arthritis, GERD, hyperlipidemia, hypertension, nephrolithiasis, conversion disorder, hypothyroidism, migraine headache who presented to the ED with complaints of difficulty finding words, difficulty speaking and the symptoms have been intermittent.  She became concerned.  She also reports that she has been having intermittent dizziness.  Her symptoms started around 9:00 in the morning today but had an episode last week as well.  Pt also reported no focal weakness or difficulty with gait and no recent fall or fall at home.   Clinical Impression   Pt demonstrating generalized weakness and reduced functional independence and mobility at this time requiring increased need for caregiver support/assistance and physical assistance for bed mobility and transfers.  Demonstrates unsteadiness with walking and gait deviations with high risk for falls.  No obvious unilateral weakness appreciated in UE or LE with adequate gross motor control.  When walking demonstrates instances of BLE shaking and difficulty in negotiating turns and stair ambulation.  Pt has supportive spouse who assists with ADL and household mobility.  Recommend continued Physical Therapy services while hospitalized to improve strength and mobility and provide caregiver training.  Recommend Home Health Physical therapy services due to current functional status and fall risk.        Recommendations for follow up therapy are one component of a multi-disciplinary discharge planning process, led by the attending physician.  Recommendations may be updated based on patient status, additional functional criteria and insurance authorization.  Follow Up Recommendations Home health PT     Assistance Recommended at Discharge Intermittent Supervision/Assistance  Functional Status Assessment Patient has had a recent decline in their functional status and demonstrates the ability to make significant improvements in function in a reasonable and predictable amount of time.  Equipment Recommendations       Recommendations for Other Services       Precautions / Restrictions Precautions Precautions: Fall Restrictions Weight Bearing Restrictions: No      Mobility  Bed Mobility Overal bed mobility: Needs Assistance Bed Mobility: Supine to Sit;Sit to Supine     Supine to sit: Modified independent (Device/Increase time) Sit to supine: Min assist     Patient Response: Cooperative  Transfers Overall transfer level: Needs assistance Equipment used: Rolling walker (2 wheels) Transfers: Sit to/from Stand;Bed to chair/wheelchair/BSC Sit to Stand: Min assist Stand pivot transfers: Min assist              Ambulation/Gait Ambulation/Gait assistance: Min guard Gait Distance (Feet): 50 Feet Assistive device: Rolling walker (2 wheels) Gait Pattern/deviations: Decreased step length - right   Gait velocity interpretation: <1.8 ft/sec, indicate of risk for recurrent falls   General Gait Details: demonstrating slow, shuffled steps with decreased step length and noting some shaking in BLE with stance phase  Stairs Stairs: Yes Stairs assistance: Min assist Stair Management: One rail Right Number of Stairs: 1    Wheelchair Mobility    Modified Rankin (Stroke Patients Only)       Balance Overall balance assessment: Mild deficits observed, not formally tested                                           Pertinent Vitals/Pain Pain Assessment: No/denies  pain (pt reports chronic baseline of LBP, but this is not currently limiting her)    Home Living Family/patient expects to be discharged to:: Private residence Living Arrangements:  Spouse/significant other Available Help at Discharge: Family Type of Home: House Home Access: Stairs to enter Entrance Stairs-Rails: Right Entrance Stairs-Number of Steps: 2   Home Layout: One level [place]: Conservation officer, nature (2 wheels);Rollator (4 wheels);Cane - single point      Prior Function Prior Level of Function : Needs assist  Cognitive Assist : Mobility (cognitive) Mobility (Cognitive): Intermittent cues   Physical Assist : ADLs (physical)   ADLs (physical): Bathing;Dressing;IADLs         Hand Dominance        Extremity/Trunk Assessment   Upper Extremity Assessment Upper Extremity Assessment: Generalized weakness    Lower Extremity Assessment Lower Extremity Assessment: Generalized weakness    Cervical / Trunk Assessment Cervical / Trunk Assessment: Normal  Communication   Communication: Prefers language other than English  Cognition Arousal/Alertness: Awake/alert Behavior During Therapy: WFL for tasks assessed/performed Overall Cognitive Status: Within Functional Limits for tasks assessed                                          General Comments      Exercises     Assessment/Plan    PT Assessment Patient needs continued PT services  PT Problem List Decreased strength;Decreased activity tolerance;Decreased balance;Decreased mobility       PT Treatment Interventions DME instruction;Gait training;Stair training;Functional mobility training;Therapeutic activities;Patient/family education;Neuromuscular re-education;Balance training;Therapeutic exercise;Manual techniques    PT Goals (Current goals can be found in the Care Plan section)  Acute Rehab PT Goals Patient Stated Goal: return home with family support PT Goal Formulation: With patient/family Time For Goal Achievement: 06/10/21 Potential to Achieve Goals: Good    Frequency Min 3X/week   Barriers to discharge        Co-evaluation               AM-PAC PT  "6 Clicks" Mobility  Outcome Measure Help needed turning from your back to your side while in a flat bed without using bedrails?: None Help needed moving from lying on your back to sitting on the side of a flat bed without using bedrails?: None Help needed moving to and from a bed to a chair (including a wheelchair)?: A Little Help needed standing up from a chair using your arms (e.g., wheelchair or bedside chair)?: A Little Help needed to walk in hospital room?: A Little Help needed climbing 3-5 steps with a railing? : A Lot 6 Click Score: 19    End of Session Equipment Utilized During Treatment: Gait belt Activity Tolerance: Patient tolerated treatment well Patient left: in bed;with bed alarm set;with family/visitor present Nurse Communication: Mobility status PT Visit Diagnosis: Unsteadiness on feet (R26.81);Other abnormalities of gait and mobility (R26.89);Muscle weakness (generalized) (M62.81)    Time: 1829-9371 PT Time Calculation (min) (ACUTE ONLY): 25 min   Charges:   PT Evaluation $PT Eval Low Complexity: 1 Low PT Treatments $Gait Training: 8-22 mins $Therapeutic Activity: 8-22 mins        10:01 AM, 06/03/21 M. Sherlyn Lees, PT, DPT Physical Therapist- [place] Office Number: [phone]

## 2021-06-03 NOTE — Discharge Summary (Signed)
Physician Discharge Summary  Kendra [name]:[id number] DOB: 1946/02/23 DOA: 06/02/2021  PCP: Neale Burly, MD Neurologist: Dr. Merlene Laughter  Admit date: 06/02/2021 Discharge date: 06/03/2021  Admitted From:  HOME  Disposition: [place]   Recommendations for Outpatient Follow-up:  Follow up with PCP in 1 weeks Follow up with neurologist in 1-2 weeks   Home Health:  PT, SLP   Discharge Condition: STABLE   CODE STATUS: FULL  DIET: heart healthy    Brief Hospitalization Summary: Please see all hospital notes, images, labs for full details of the hospitalization. ADMISSION HPI:  75 y.o. female with medical history significant for chronic back pain, chronic chest pain, arthritis, GERD, hyperlipidemia, hypertension, nephrolithiasis, conversion disorder, hypothyroidism, migraine headache who presented to the ED with complaints of difficulty finding words, difficulty speaking and the symptoms have been intermittent.  She became concerned.  She also reports that she has been having intermittent dizziness.  Her symptoms started around 9:00 in the morning today but had an episode last week as well.  Pt also reported no focal weakness or difficulty with gait and no recent fall or fall at home.     ED Course: Code stroke called in ED patient was evaluated and noted to have a CT brain without contrast with no acute findings.  Pt was recommended for acute TIA / CVA work up with MRI brain.  Pt was started on aspirin therapy.  Pt is being admitted to complete work up.   HOSPITAL COURSE   A qualified Spanish interpreter was used to communicate with the patient   The patient was admitted for full TIA/CVA work-up with her presenting diagnosis of dysarthria.  Fortunately MRI brain without contrast did not show any acute findings.  She also had a TTE completed reported below but no significant findings to explain symptoms.  She was seen by the telemetry neurologist and started on aspirin 81  mg daily which she seems to be tolerating at this time.  She was noted to have noncardiac chest pain with reassuring high-sensitivity troponin testing.  Fortunately her symptoms have resolved.  She has been evaluated by physical therapy and they have recommended home health care.  She had complained about dysarthria and difficulty recalling words and we have made arrangements for her to have home health speech therapy for further evaluation.  I am also recommended that she follow-up with her primary neurologist Dr. Merlene Laughter and have further outpatient monitoring and testing done.  He is stable to discharge home with her family today.  Results reviewed with the patient and family and she is stable to discharge home.  Discharge Diagnoses:  Principal Problem:   TIA (transient ischemic attack) Active Problems:   HYPERPARATHYROIDISM UNSPECIFIED   Dyslipidemia   Migraine headache   HYPERTENSION, BENIGN ESSENTIAL   Allergic rhinitis   GERD   OSA on CPAP   Morbid obesity (HCC)   Chest pain   History of DVT (deep vein thrombosis)   Conversion disorder   Discharge Instructions:  Allergies as of 06/03/2021       Reactions   Hydrocodone Nausea And Vomiting   Morphine Swelling   REACTION: swelling   Pineapple Swelling   Throat swelling        Medication List     STOP taking these medications    lisinopril 40 MG tablet Commonly known as: ZESTRIL       TAKE these medications    amitriptyline 50 MG tablet Commonly known as: ELAVIL Take  1 tablet (50 mg total) by mouth at bedtime.   aspirin 81 MG EC tablet Take 1 tablet (81 mg total) by mouth daily. Swallow whole. Start taking on: June 04, 2021   clotrimazole-betamethasone cream Commonly known as: LOTRISONE Apply 1 application topically daily.   cyclobenzaprine 5 MG tablet Commonly known as: FLEXERIL Take 1 tablet (5 mg total) by mouth 3 (three) times daily as needed for muscle spasms.   nitroGLYCERIN 0.4 MG SL  tablet Commonly known as: NITROSTAT Place 1 tablet (0.4 mg total) under the tongue every 5 (five) minutes x 3 doses as needed for chest pain.   Optimal-D 1.25 MG (50000 UT) capsule Generic drug: Cholecalciferol Take 1 capsule (50,000 Units total) by mouth once a week.   pantoprazole 40 MG tablet Commonly known as: PROTONIX Take 1 tablet (40 mg total) by mouth daily.   propranolol 20 MG tablet Commonly known as: INDERAL Take 0.5 tablets (10 mg total) by mouth 2 (two) times daily. What changed: how much to take   risperiDONE 0.5 MG tablet Commonly known as: RISPERDAL Take 1 tablet (0.5 mg total) by mouth at bedtime.   rosuvastatin 40 MG tablet Commonly known as: CRESTOR Take 40 mg by mouth daily.   sertraline 50 MG tablet Commonly known as: ZOLOFT Take 150 mg by mouth at bedtime.   solifenacin 10 MG tablet Commonly known as: VESICARE Take 10 mg by mouth daily.   topiramate 25 MG tablet Commonly known as: TOPAMAX Take 1 tablet by mouth daily.        Follow-up Information     Neale Burly, MD. Schedule an appointment as soon as possible for a visit in 1 week(s).   Specialty: Internal Medicine Why: Hospital Follow Up Contact information: [address] [place] [address] 098 [phone]         Arnoldo Lenis, MD. Schedule an appointment as soon as possible for a visit in 2 week(s).   Specialty: Cardiology Why: Hospital Follow Up Contact information: [place] [address] [phone]         Phillips Odor, MD. Schedule an appointment as soon as possible for a visit in 1 month(s).   Specialty: Neurology Why: Hospital Follow Up Contact information: [place] [address] [phone]         Care, [facility] Follow up.   Specialty: Home Health Services Why: [facility] Contact information: [address] [place] [address] [phone]                Allergies   Allergen Reactions   Hydrocodone Nausea And Vomiting   Morphine Swelling    REACTION: swelling   Pineapple Swelling    Throat swelling   Allergies as of 06/03/2021       Reactions   Hydrocodone Nausea And Vomiting   Morphine Swelling   REACTION: swelling   Pineapple Swelling   Throat swelling        Medication List     STOP taking these medications    lisinopril 40 MG tablet Commonly known as: ZESTRIL       TAKE these medications    amitriptyline 50 MG tablet Commonly known as: ELAVIL Take 1 tablet (50 mg total) by mouth at bedtime.   aspirin 81 MG EC tablet Take 1 tablet (81 mg total) by mouth daily. Swallow whole. Start taking on: June 04, 2021   clotrimazole-betamethasone cream Commonly known as: Donalynn Furlong  Apply 1 application topically daily.   cyclobenzaprine 5 MG tablet Commonly known as: FLEXERIL Take 1 tablet (5 mg total) by mouth 3 (three) times daily as needed for muscle spasms.   nitroGLYCERIN 0.4 MG SL tablet Commonly known as: NITROSTAT Place 1 tablet (0.4 mg total) under the tongue every 5 (five) minutes x 3 doses as needed for chest pain.   Optimal-D 1.25 MG (50000 UT) capsule Generic drug: Cholecalciferol Take 1 capsule (50,000 Units total) by mouth once a week.   pantoprazole 40 MG tablet Commonly known as: PROTONIX Take 1 tablet (40 mg total) by mouth daily.   propranolol 20 MG tablet Commonly known as: INDERAL Take 0.5 tablets (10 mg total) by mouth 2 (two) times daily. What changed: how much to take   risperiDONE 0.5 MG tablet Commonly known as: RISPERDAL Take 1 tablet (0.5 mg total) by mouth at bedtime.   rosuvastatin 40 MG tablet Commonly known as: CRESTOR Take 40 mg by mouth daily.   sertraline 50 MG tablet Commonly known as: ZOLOFT Take 150 mg by mouth at bedtime.   solifenacin 10 MG tablet Commonly known as: VESICARE Take 10 mg by mouth daily.   topiramate 25 MG tablet Commonly known as: TOPAMAX Take 1  tablet by mouth daily.        Procedures/Studies: MR BRAIN WO CONTRAST  Result Date: 06/02/2021 CLINICAL DATA:  TIA, dizziness, chest pain, confusion, difficulty speaking EXAM: MRI HEAD WITHOUT CONTRAST TECHNIQUE: Multiplanar, multiecho pulse sequences of the brain and surrounding structures were obtained without intravenous contrast. COMPARISON:  No prior MRI, correlation is made with CT head 06/02/2021 FINDINGS: Brain: No restricted diffusion to suggest acute infarct. No acute hemorrhage mass, mass effect, or midline shift. T2 hyperintense signal in the periventricular white matter, likely the sequela of chronic small vessel ischemic disease. Vascular: Normal flow voids. Skull and upper cervical spine: Normal marrow signal. Sinuses/Orbits: Mucosal thickening, most prominent in the ethmoid air cells. The orbits are unremarkable. Other: Fluid in the right-greater-than-left mastoid air cells. IMPRESSION: No acute intracranial process. Electronically Signed   By: Merilyn Baba M.D.   On: 06/02/2021 14:21   ECHOCARDIOGRAM COMPLETE  Result Date: 06/03/2021    ECHOCARDIOGRAM REPORT   Patient Name:   Kendra [name] Date of Exam: 06/03/2021 Medical Rec #:  [id number]       Height:       63.0 in Accession #:    [id number]      Weight:       198.4 lb Date of Birth:  10-Sep-1945       BSA:          1.927 m Patient Age:    75 years        BP:           113/71 mmHg Patient Gender: F               HR:           74 bpm. Exam Location:  [facility] Procedure: 2D Echo, Cardiac Doppler and Color Doppler Indications:    TIA  History:        Patient has prior history of Echocardiogram examinations, most                 recent 11/30/2020. TIA, Signs/Symptoms:Chest Pain; Risk                 Factors:Hypertension and Dyslipidemia.  Sonographer:    Wenda Low Referring Phys: [place]  Saige Busby IMPRESSIONS  1. Left ventricular ejection fraction, by estimation, is 55 to 60%. The left ventricle has normal function. The  left ventricle has no regional wall motion abnormalities. There is mild left ventricular hypertrophy. Left ventricular diastolic parameters are indeterminate.  2. Right ventricular systolic function is normal. The right ventricular size is normal. Tricuspid regurgitation signal is inadequate for assessing PA pressure.  3. The mitral valve is grossly normal. Trivial mitral valve regurgitation.  4. The aortic valve is tricuspid. Aortic valve regurgitation is not visualized. No aortic stenosis is present. Aortic valve mean gradient measures 4.0 mmHg.  5. The inferior vena cava is normal in size with greater than 50% respiratory variability, suggesting right atrial pressure of 3 mmHg. Comparison(s): Prior images reviewed side by side. LVEF remains normal. FINDINGS  Left Ventricle: Left ventricular ejection fraction, by estimation, is 55 to 60%. The left ventricle has normal function. The left ventricle has no regional wall motion abnormalities. The left ventricular internal cavity size was normal in size. There is  mild left ventricular hypertrophy. Left ventricular diastolic parameters are indeterminate. Right Ventricle: The right ventricular size is normal. No increase in right ventricular wall thickness. Right ventricular systolic function is normal. Tricuspid regurgitation signal is inadequate for assessing PA pressure. Left Atrium: Left atrial size was normal in size. Right Atrium: Right atrial size was normal in size. Pericardium: There is no evidence of pericardial effusion. Mitral Valve: The mitral valve is grossly normal. Trivial mitral valve regurgitation. MV peak gradient, 3.2 mmHg. The mean mitral valve gradient is 1.0 mmHg. Tricuspid Valve: The tricuspid valve is grossly normal. Tricuspid valve regurgitation is trivial. Aortic Valve: The aortic valve is tricuspid. There is mild aortic valve annular calcification. Aortic valve regurgitation is not visualized. No aortic stenosis is present. Aortic valve mean  gradient measures 4.0 mmHg. Aortic valve peak gradient measures 6.1 mmHg. Aortic valve area, by VTI measures 2.81 cm. Pulmonic Valve: The pulmonic valve was grossly normal. Pulmonic valve regurgitation is trivial. Aorta: The aortic root is normal in size and structure. Venous: The inferior vena cava is normal in size with greater than 50% respiratory variability, suggesting right atrial pressure of 3 mmHg. IAS/Shunts: No atrial level shunt detected by color flow Doppler.  LEFT VENTRICLE PLAX 2D LVIDd:         4.40 cm     Diastology LVIDs:         2.60 cm     LV e' medial:    7.94 cm/s LV PW:         1.20 cm     LV E/e' medial:  4.7 LV IVS:        1.30 cm     LV e' lateral:   9.68 cm/s LVOT diam:     2.00 cm     LV E/e' lateral: 3.9 LV SV:         78 LV SV Index:   41 LVOT Area:     3.14 cm  LV Volumes (MOD) LV vol d, MOD A2C: 52.5 ml LV vol d, MOD A4C: 58.2 ml LV vol s, MOD A2C: 20.0 ml LV vol s, MOD A4C: 26.8 ml LV SV MOD A2C:     32.5 ml LV SV MOD A4C:     58.2 ml LV SV MOD BP:      31.9 ml RIGHT VENTRICLE RV Basal diam:  2.90 cm RV Mid diam:    2.40 cm RV S prime:     9.03 cm/s  TAPSE (M-mode): 2.2 cm LEFT ATRIUM             Index        RIGHT ATRIUM           Index LA diam:        3.70 cm 1.92 cm/m   RA Area:     12.60 cm LA Vol (A2C):   45.4 ml 23.56 ml/m  RA Volume:   30.30 ml  15.72 ml/m LA Vol (A4C):   35.5 ml 18.42 ml/m LA Biplane Vol: 41.5 ml 21.53 ml/m  AORTIC VALVE                    PULMONIC VALVE AV Area (Vmax):    2.94 cm     PV Vmax:       0.77 m/s AV Area (Vmean):   2.49 cm     PV Peak grad:  2.4 mmHg AV Area (VTI):     2.81 cm AV Vmax:           123.00 cm/s AV Vmean:          89.400 cm/s AV VTI:            0.278 m AV Peak Grad:      6.1 mmHg AV Mean Grad:      4.0 mmHg LVOT Vmax:         115.00 cm/s LVOT Vmean:        70.900 cm/s LVOT VTI:          0.249 m LVOT/AV VTI ratio: 0.90  AORTA Ao Root diam: 3.10 cm MITRAL VALVE MV Area (PHT): 2.88 cm    SHUNTS MV Area VTI:   4.23 cm     Systemic VTI:  0.25 m MV Peak grad:  3.2 mmHg    Systemic Diam: 2.00 cm MV Mean grad:  1.0 mmHg MV Vmax:       0.89 m/s MV Vmean:      50.1 cm/s MV Decel Time: 263 msec MV E velocity: 37.70 cm/s MV A velocity: 79.60 cm/s MV E/A ratio:  0.47 Rozann Lesches MD Electronically signed by Rozann Lesches MD Signature Date/Time: 06/03/2021/11:44:31 AM    Final    CT HEAD CODE STROKE WO CONTRAST  Result Date: 06/02/2021 CLINICAL DATA:  Code stroke. Neuro deficit, acute, stroke suspected. Difficulty speaking, confusion, dizziness, and chest pain. EXAM: CT HEAD WITHOUT CONTRAST TECHNIQUE: Contiguous axial images were obtained from the base of the skull through the vertex without intravenous contrast. COMPARISON:  Head CT 04/19/2021 FINDINGS: Brain: There is no evidence of an acute infarct, intracranial hemorrhage, mass, midline shift, or extra-axial fluid collection. The ventricles and sulci are within normal limits for age. Hypodensities in the cerebral white matter bilaterally are similar to the prior study and nonspecific but compatible with mild chronic small vessel ischemic disease. Vascular: Calcified atherosclerosis at the skull base. No hyperdense vessel. Skull: No fracture or suspicious osseous lesion. Sinuses/Orbits: Mild mucosal thickening in the paranasal sinuses. No significant mastoid fluid. Unremarkable orbits. Other: None. ASPECTS [facility] Stroke Program Early CT Score) - Ganglionic level infarction (caudate, lentiform nuclei, internal capsule, insula, M1-M3 cortex): 7 - Supraganglionic infarction (M4-M6 cortex): 3 Total score (0-10 with 10 being normal): 10 IMPRESSION: 1. No evidence of acute intracranial abnormality. ASPECTS of 10. 2. Mild chronic small vessel ischemic disease. These results were called by telephone at the time of interpretation on 06/02/2021 at 10:24 am to Dr. Davonna Belling, who verbally acknowledged these  results. Electronically Signed   By: Logan Bores M.D.   On: 06/02/2021  10:24   CT ANGIO HEAD NECK W WO CM (CODE STROKE)  Result Date: 06/02/2021 CLINICAL DATA:  Neck trauma, focal neuro deficit or paresthesia. Additional history provided: Dizziness, chest pain, confusion, difficulty speaking, symptoms on Sunday and again this morning. EXAM: CT ANGIOGRAPHY HEAD AND NECK TECHNIQUE: Multidetector CT imaging of the head and neck was performed using the standard protocol during bolus administration of intravenous contrast. Multiplanar CT image reconstructions and MIPs were obtained to evaluate the vascular anatomy. Carotid stenosis measurements (when applicable) are obtained utilizing NASCET criteria, using the distal internal carotid diameter as the denominator. CONTRAST:  21mL OMNIPAQUE IOHEXOL 350 MG/ML SOLN COMPARISON:  Noncontrast head CT performed earlier today 06/02/2021. FINDINGS: CTA NECK FINDINGS Aortic arch: Standard aortic branching. Mild calcified plaque within the proximal major branch vessels of the neck. Streak and beam hardening artifact arising from a dense right-sided contrast bolus partially obscures the right subclavian artery. Within this limitation, there is no appreciable hemodynamically significant innominate or proximal subclavian artery stenosis. Right carotid system: CCA and ICA patent within the neck without stenosis. Mild calcified plaque within the proximal ICA. Left carotid system: CCA and ICA patent within the neck without stenosis. Mild soft and calcified plaque within the proximal ICA. Vertebral arteries: Vertebral arteries codominant and patent within the neck without stenosis. Minimal nonstenotic calcified plaque at the origin of the right vertebral artery. Skeleton: No acute bony abnormality or aggressive osseous lesion. Multilevel ventrolateral osteophytes within the imaged upper thoracic spine. Other neck: No neck mass or cervical lymphadenopathy. Upper chest: No consolidation within the imaged lung apices. Review of the MIP images confirms the  above findings CTA HEAD FINDINGS Anterior circulation: The intracranial internal carotid arteries are patent. Calcified plaque within both vessels without stenosis. The M1 middle cerebral arteries are patent. No M2 proximal branch occlusion or high-grade proximal stenosis is identified. Mild to moderate stenosis within a mid M2 right MCA vessel (series 12, image 16). The anterior cerebral arteries are patent. No intracranial aneurysm is identified. Posterior circulation: The intracranial vertebral arteries are patent. The basilar artery is patent. The posterior cerebral arteries are patent. Posterior communicating arteries are diminutive or absent bilaterally. Venous sinuses: Within the limitations of contrast timing, no convincing thrombus. Anatomic variants: As described Review of the MIP images confirms the above findings No emergent large vessel occlusion. These results were called by telephone at the time of interpretation on 06/02/2021 at 11:01 am to provider [facility] , who verbally acknowledged these results. IMPRESSION: CTA neck: The common carotid, internal carotid and vertebral arteries are patent within the neck without stenosis. Mild atherosclerotic plaque within the proximal major branch vessels of the neck, proximal internal carotid arteries and at the origin of the right vertebral artery. CTA head: 1. No intracranial large vessel occlusion or proximal high-grade arterial stenosis. 2. 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 3. Mild non-stenotic calcified plaque within the intracranial internal carotid arteries. Electronically Signed   By: Kellie Simmering D.O.   On: 06/02/2021 11:03     Subjective: Patient reports that she is feeling better today.  She is not having chest pain or difficulty speaking.  No focal weaknesses.  Discharge Exam: Vitals:   06/03/21 1106 06/03/21 1357  BP: 126/80 131/73  Pulse: 72 72  Resp: 18   Temp:  98.2 F (36.8 C)  SpO2: 96% 93%    Vitals:   06/03/21  7741 06/03/21 0900 06/03/21 1106 06/03/21 1357  BP: 113/72 (!) 83/70 126/80 131/73  Pulse: 74 82 72 72  Resp: 17 18 18    Temp: 97.8 F (36.6 C) 97.9 F (36.6 C)  98.2 F (36.8 C)  TempSrc: Oral Oral  Oral  SpO2: 96% 100% 96% 93%  Weight:      Height:       General: Pt is alert, awake, not in acute distress Cardiovascular: normal S1/S2 +, no rubs, no gallops Respiratory: CTA bilaterally, no wheezing, no rhonchi Abdominal: Soft, NT, ND, bowel sounds + Extremities: no edema, no cyanosis NEUROLOGICAL: nonfocal exam    The results of significant diagnostics from this hospitalization (including imaging, microbiology, ancillary and laboratory) are listed below for reference.     Microbiology: Recent Results (from the past 240 hour(s))  Resp Panel by RT-PCR (Flu A&B, Covid) Nasopharyngeal Swab     Status: None   Collection Time: 06/02/21 11:20 AM   Specimen: Nasopharyngeal Swab; Nasopharyngeal(NP) swabs in vial transport medium  Result Value Ref Range Status   SARS Coronavirus 2 by RT PCR NEGATIVE NEGATIVE Final    Comment: (NOTE) SARS-CoV-2 target nucleic acids are NOT DETECTED.  The SARS-CoV-2 RNA is generally detectable in upper respiratory specimens during the acute phase of infection. The lowest concentration of SARS-CoV-2 viral copies this assay can detect is 138 copies/mL. A negative result does not preclude SARS-Cov-2 infection and should not be used as the sole basis for treatment or other patient management decisions. A negative result may occur with  improper specimen collection/handling, submission of specimen other than nasopharyngeal swab, presence of viral mutation(s) within the areas targeted by this assay, and inadequate number of viral copies(<138 copies/mL). A negative result must be combined with clinical observations, patient history, and epidemiological information. The expected result is Negative.  Fact Sheet for Patients:   EntrepreneurPulse.com.au  Fact Sheet for Healthcare Providers:  IncredibleEmployment.be  This test is no t yet approved or cleared by the [place] FDA and  has been authorized for detection and/or diagnosis of SARS-CoV-2 by FDA under an Emergency Use Authorization (EUA). This EUA will remain  in effect (meaning this test can be used) for the duration of the COVID-19 declaration under Section 564(b)(1) of the Act, 21 U.S.C.section 360bbb-3(b)(1), unless the authorization is terminated  or revoked sooner.       Influenza A by PCR NEGATIVE NEGATIVE Final   Influenza B by PCR NEGATIVE NEGATIVE Final    Comment: (NOTE) The Xpert Xpress SARS-CoV-2/FLU/RSV plus assay is intended as an aid in the diagnosis of influenza from Nasopharyngeal swab specimens and should not be used as a sole basis for treatment. Nasal washings and aspirates are unacceptable for Xpert Xpress SARS-CoV-2/FLU/RSV testing.  Fact Sheet for Patients: EntrepreneurPulse.com.au  Fact Sheet for Healthcare Providers: IncredibleEmployment.be  This test is not yet approved or cleared by the [place] FDA and has been authorized for detection and/or diagnosis of SARS-CoV-2 by FDA under an Emergency Use Authorization (EUA). This EUA will remain in effect (meaning this test can be used) for the duration of the COVID-19 declaration under Section 564(b)(1) of the Act, 21 U.S.C. section 360bbb-3(b)(1), unless the authorization is terminated or revoked.  Performed at [facility], [address], [place], [place] [address]      Labs: BNP (last 3 results) No results for input(s): BNP in the last 8760 hours. Basic Metabolic Panel: Recent Labs  Lab 06/02/21 1030 06/02/21 1049  NA 139 140  K 4.7  4.0  CL 104 102  CO2 29  --   GLUCOSE 110* 103*  BUN 18 17  CREATININE 0.79 0.80  CALCIUM 8.8*  --    Liver Function Tests: Recent Labs   Lab 06/02/21 1030  AST 28  ALT 18  ALKPHOS 100  BILITOT 1.1  PROT 6.2*  ALBUMIN 3.5   No results for input(s): LIPASE, AMYLASE in the last 168 hours. No results for input(s): AMMONIA in the last 168 hours. CBC: Recent Labs  Lab 06/02/21 1030 06/02/21 1049  WBC 6.3  --   NEUTROABS 4.1  --   HGB 13.4 13.3  HCT 40.3 39.0  MCV 99.0  --   PLT 221  --    Cardiac Enzymes: No results for input(s): CKTOTAL, CKMB, CKMBINDEX, TROPONINI in the last 168 hours. BNP: Invalid input(s): POCBNP CBG: Recent Labs  Lab 06/02/21 1009  GLUCAP 121*   D-Dimer No results for input(s): DDIMER in the last 72 hours. Hgb A1c Recent Labs    06/02/21 1030  HGBA1C 5.8*   Lipid Profile Recent Labs    06/03/21 0407  CHOL 174  HDL 46  LDLCALC 97  TRIG 153*  CHOLHDL 3.8   Thyroid function studies No results for input(s): TSH, T4TOTAL, T3FREE, THYROIDAB in the last 72 hours.  Invalid input(s): FREET3 Anemia work up No results for input(s): VITAMINB12, FOLATE, FERRITIN, TIBC, IRON, RETICCTPCT in the last 72 hours. Urinalysis    Component Value Date/Time   COLORURINE STRAW (A) 06/02/2021 1205   APPEARANCEUR CLEAR 06/02/2021 1205   APPEARANCEUR Clear 04/26/2021 1015   LABSPEC 1.030 06/02/2021 1205   PHURINE 8.0 06/02/2021 1205   GLUCOSEU NEGATIVE 06/02/2021 1205   HGBUR NEGATIVE 06/02/2021 1205   HGBUR trace-intact 04/27/2009 1109   BILIRUBINUR NEGATIVE 06/02/2021 1205   BILIRUBINUR Present 04/26/2021 [place] 06/02/2021 1205   PROTEINUR NEGATIVE 06/02/2021 1205   UROBILINOGEN 1.0 03/31/2020 1537   UROBILINOGEN 0.2 03/15/2015 2324   NITRITE NEGATIVE 06/02/2021 1205   LEUKOCYTESUR NEGATIVE 06/02/2021 1205   Sepsis Labs Invalid input(s): PROCALCITONIN,  WBC,  LACTICIDVEN Microbiology Recent Results (from the past 240 hour(s))  Resp Panel by RT-PCR (Flu A&B, Covid) Nasopharyngeal Swab     Status: None   Collection Time: 06/02/21 11:20 AM   Specimen:  Nasopharyngeal Swab; Nasopharyngeal(NP) swabs in vial transport medium  Result Value Ref Range Status   SARS Coronavirus 2 by RT PCR NEGATIVE NEGATIVE Final    Comment: (NOTE) SARS-CoV-2 target nucleic acids are NOT DETECTED.  The SARS-CoV-2 RNA is generally detectable in upper respiratory specimens during the acute phase of infection. The lowest concentration of SARS-CoV-2 viral copies this assay can detect is 138 copies/mL. A negative result does not preclude SARS-Cov-2 infection and should not be used as the sole basis for treatment or other patient management decisions. A negative result may occur with  improper specimen collection/handling, submission of specimen other than nasopharyngeal swab, presence of viral mutation(s) within the areas targeted by this assay, and inadequate number of viral copies(<138 copies/mL). A negative result must be combined with clinical observations, patient history, and epidemiological information. The expected result is Negative.  Fact Sheet for Patients:  EntrepreneurPulse.com.au  Fact Sheet for Healthcare Providers:  IncredibleEmployment.be  This test is no t yet approved or cleared by the [place] FDA and  has been authorized for detection and/or diagnosis of SARS-CoV-2 by FDA under an Emergency Use Authorization (EUA). This EUA will remain  in effect (meaning this test  can be used) for the duration of the COVID-19 declaration under Section 564(b)(1) of the Act, 21 U.S.C.section 360bbb-3(b)(1), unless the authorization is terminated  or revoked sooner.       Influenza A by PCR NEGATIVE NEGATIVE Final   Influenza B by PCR NEGATIVE NEGATIVE Final    Comment: (NOTE) The Xpert Xpress SARS-CoV-2/FLU/RSV plus assay is intended as an aid in the diagnosis of influenza from Nasopharyngeal swab specimens and should not be used as a sole basis for treatment. Nasal washings and aspirates are unacceptable for  Xpert Xpress SARS-CoV-2/FLU/RSV testing.  Fact Sheet for Patients: EntrepreneurPulse.com.au  Fact Sheet for Healthcare Providers: IncredibleEmployment.be  This test is not yet approved or cleared by the [place] FDA and has been authorized for detection and/or diagnosis of SARS-CoV-2 by FDA under an Emergency Use Authorization (EUA). This EUA will remain in effect (meaning this test can be used) for the duration of the COVID-19 declaration under Section 564(b)(1) of the Act, 21 U.S.C. section 360bbb-3(b)(1), unless the authorization is terminated or revoked.  Performed at [facility], [address], [place], [place] [address]     Time coordinating discharge:   SIGNED:  Irwin Brakeman, MD  [facility] 06/03/2021, 3:34 PM How to contact the [facility] Attending or Consulting provider [place] or covering provider during after hours [place], for this patient?  Check the care team in [facility] and look for a) attending/consulting TRH provider listed and b) the [facility] team listed Log into [email or web address] and use [place]'s universal password to access. If you do not have the password, please contact the hospital operator. Locate the [facility] provider you are looking for under [facility] and page to a number that you can be directly reached. If you still have difficulty reaching the provider, please page the [facility] (Director on Call) for the Hospitalists listed on amion for assistance.

## 2021-06-03 NOTE — Plan of Care (Signed)
  Problem: Acute Rehab PT Goals(only PT should resolve) Goal: Pt Will Go Sit To Supine/Side Outcome: Progressing Flowsheets (Taken 06/03/2021 1003) Pt will go Sit to Supine/Side: with modified independence Goal: Patient Will Transfer Sit To/From Stand Outcome: Progressing Flowsheets (Taken 06/03/2021 1003) Patient will transfer sit to/from stand: with supervision Goal: Pt Will Transfer Bed To Chair/Chair To Bed Outcome: Progressing Flowsheets (Taken 06/03/2021 1003) Pt will Transfer Bed to Chair/Chair to Bed: with supervision Goal: Pt Will Ambulate Outcome: Progressing Flowsheets (Taken 06/03/2021 1003) Pt will Ambulate:  > 125 feet  with supervision  with rolling walker Goal: Pt Will Go Up/Down Stairs Outcome: Progressing Flowsheets (Taken 06/03/2021 1003) Pt will Go Up / Down Stairs:  1-2 stairs  with min guard assist  10:03 AM, 06/03/21 M. Sherlyn Lees, PT, DPT Physical Therapist- [place] Office Number: [phone]

## 2021-06-03 NOTE — Progress Notes (Signed)
Nsg Discharge Note  Admit Date:  06/02/2021 Discharge date: 06/03/2021   Kendra [name] to be D/C'd Home per MD order.  AVS completed.  Patient/caregiver able to verbalize understanding.  Discharge Medication: Allergies as of 06/03/2021       Reactions   Hydrocodone Nausea And Vomiting   Morphine Swelling   REACTION: swelling   Pineapple Swelling   Throat swelling        Medication List     STOP taking these medications    lisinopril 40 MG tablet Commonly known as: ZESTRIL       TAKE these medications    amitriptyline 50 MG tablet Commonly known as: ELAVIL Take 1 tablet (50 mg total) by mouth at bedtime.   aspirin 81 MG EC tablet Take 1 tablet (81 mg total) by mouth daily. Swallow whole. Start taking on: June 04, 2021   clotrimazole-betamethasone cream Commonly known as: LOTRISONE Apply 1 application topically daily.   cyclobenzaprine 5 MG tablet Commonly known as: FLEXERIL Take 1 tablet (5 mg total) by mouth 3 (three) times daily as needed for muscle spasms.   nitroGLYCERIN 0.4 MG SL tablet Commonly known as: NITROSTAT Place 1 tablet (0.4 mg total) under the tongue every 5 (five) minutes x 3 doses as needed for chest pain.   Optimal-D 1.25 MG (50000 UT) capsule Generic drug: Cholecalciferol Take 1 capsule (50,000 Units total) by mouth once a week.   pantoprazole 40 MG tablet Commonly known as: PROTONIX Take 1 tablet (40 mg total) by mouth daily.   propranolol 20 MG tablet Commonly known as: INDERAL Take 0.5 tablets (10 mg total) by mouth 2 (two) times daily. What changed: how much to take   risperiDONE 0.5 MG tablet Commonly known as: RISPERDAL Take 1 tablet (0.5 mg total) by mouth at bedtime.   rosuvastatin 40 MG tablet Commonly known as: CRESTOR Take 40 mg by mouth daily.   sertraline 50 MG tablet Commonly known as: ZOLOFT Take 150 mg by mouth at bedtime.   solifenacin 10 MG tablet Commonly known as: VESICARE Take 10 mg by mouth  daily.   topiramate 25 MG tablet Commonly known as: TOPAMAX Take 1 tablet by mouth daily.        Discharge Assessment: Vitals:   06/03/21 1106 06/03/21 1357  BP: 126/80 131/73  Pulse: 72 72  Resp: 18   Temp:  98.2 F (36.8 C)  SpO2: 96% 93%   Skin clean, dry and intact without evidence of skin break down, no evidence of skin tears noted. IV catheter discontinued intact. Site without signs and symptoms of complications - no redness or edema noted at insertion site, patient denies c/o pain - only slight tenderness at site.  Dressing with slight pressure applied.  D/c Instructions-Education: Discharge instructions given to patient/family with verbalized understanding. D/c education completed with patient/family including follow up instructions, medication list, d/c activities limitations if indicated, with other d/c instructions as indicated by MD - patient able to verbalize understanding, all questions fully answered. Patient instructed to return to ED, call 911, or call MD for any changes in condition.  Patient escorted via [place], and D/C home via private auto.  Kathie Rhodes, RN 06/03/2021 2:53 PM

## 2021-06-03 NOTE — Progress Notes (Signed)
Initial Nutrition Assessment  DOCUMENTATION CODES:   Obesity unspecified  INTERVENTION:   -Ensure Enlive po BID, each supplement provides 350 kcal and 20 grams of protein  -MVI with minerals daily  NUTRITION DIAGNOSIS:   Increased nutrient needs related to acute illness as evidenced by estimated needs.  GOAL:   Patient will meet greater than or equal to 90% of their needs  MONITOR:   PO intake, Supplement acceptance, Labs, Weight trends, Skin, I & O's  REASON FOR ASSESSMENT:   Malnutrition Screening Tool    ASSESSMENT:   Kendra [name] is a 76 y.o. female with medical history significant for chronic back pain, chronic chest pain, arthritis, GERD, hyperlipidemia, hypertension, nephrolithiasis, conversion disorder, hypothyroidism, migraine headache who presented to the ED with complaints of difficulty finding words, difficulty speaking and the symptoms have been intermittent.  She became concerned.  She also reports that she has been having intermittent dizziness.  Her symptoms started around 9:00 in the morning today but had an episode last week as well.  Pt also reported no focal weakness or difficulty with gait and no recent fall or fall at home.  Pt admitted with TIA with dysarthria.   Reviewed I/O's: +721 ml x 24 hours  Pt unavailable at time of visit. Attempted to speak with pt via call to hospital room phone, however, unable to reach. RD unable to obtain further nutrition-related history or complete nutrition-focused physical exam at this time.    Pt currently on a heart healthy diet. No meal completion data available to assess at this time. Pt awaiting PT, OT, and SLP evaluations.   Reviewed wt hx; pt has experienced a 8% wt loss over the past 5 months, which is not significant for time frame. Noted difference in admission wt and most recent wt (209# vs 198#) so unsure of accuracy of weights.   Medications reviewed and include 0.9% sodium chloride infusion @ 50 ml/hr.    Labs reviewed.   Diet Order:   Diet Order             Diet Heart Room service appropriate? Yes; Fluid consistency: Thin  Diet effective now                   EDUCATION NEEDS:   Education needs have been addressed  Skin:  Skin Assessment: Reviewed RN Assessment  Last BM:  05/31/21  Height:   Ht Readings from Last 1 Encounters:  06/02/21 5\' 3"  (1.6 m)    Weight:   Wt Readings from Last 1 Encounters:  06/02/21 90 kg    Ideal Body Weight:  52.3 kg  BMI:  Body mass index is 35.15 kg/m.  Estimated Nutritional Needs:   Kcal:  1550-1750  Protein:  80-95 grams  Fluid:  > 1.5 L    Loistine Chance, RD, LDN, [place] Registered Dietitian II Certified Diabetes Care and Education Specialist Please refer to [facility] for RD and/or RD on-call/weekend/after hours pager

## 2021-06-03 NOTE — TOC Transition Note (Signed)
Transition of Care [facility]) - CM/SW Discharge Note   Patient Details  Name: Kendra [name] MRN: [id number] Date of Birth: [birth year]  Transition of Care [facility] CM/SW Contact:  Natasha Bence, LCSW Phone Number: 06/03/2021, 2:49 PM   Clinical Narrative:    CSW notified of patient's readiness for discharge. Patient agreeable to [facility]. CSW referred patient to [place] with Alvis Lemmings. Georgina Snell agreeable to provide [facility] services. TOC signing off.    Final next level of care: [place] Barriers to Discharge: Barriers Resolved   Patient Goals and CMS Choice Patient states their goals for this hospitalization and ongoing recovery are:: Return home with [facility] CMS Medicare.gov Compare Post Acute Care list provided to:: Patient Choice offered to / list presented to : Patient  Discharge Placement                    Patient and family notified of of transfer: 06/03/21  Discharge Plan and Services                          HH Arranged: PT, Speech Therapy [place] Agency: [place] Date [facility] Agency Contacted: 06/03/21 Time [place]: [phone] Representative spoke with at [place]: [place] ([place]) Interventions     Readmission Risk Interventions No flowsheet data found.

## 2021-06-03 NOTE — Progress Notes (Signed)
*  PRELIMINARY RESULTS* Echocardiogram 2D Echocardiogram has been performed.  Kendra [name] 06/03/2021, 8:54 AM

## 2021-06-05 DIAGNOSIS — B9681 Helicobacter pylori [H. pylori] as the cause of diseases classified elsewhere: Secondary | ICD-10-CM | POA: Diagnosis not present

## 2021-06-06 DIAGNOSIS — Z8673 Personal history of transient ischemic attack (TIA), and cerebral infarction without residual deficits: Secondary | ICD-10-CM | POA: Diagnosis not present

## 2021-06-06 DIAGNOSIS — I10 Essential (primary) hypertension: Secondary | ICD-10-CM | POA: Diagnosis not present

## 2021-06-06 DIAGNOSIS — Z6834 Body mass index (BMI) 34.0-34.9, adult: Secondary | ICD-10-CM | POA: Diagnosis not present

## 2021-06-08 ENCOUNTER — Encounter: Payer: Self-pay | Admitting: Cardiology

## 2021-06-19 DIAGNOSIS — R251 Tremor, unspecified: Secondary | ICD-10-CM | POA: Diagnosis not present

## 2021-06-19 DIAGNOSIS — G43711 Chronic migraine without aura, intractable, with status migrainosus: Secondary | ICD-10-CM | POA: Diagnosis not present

## 2021-06-19 DIAGNOSIS — G459 Transient cerebral ischemic attack, unspecified: Secondary | ICD-10-CM | POA: Diagnosis not present

## 2021-06-19 DIAGNOSIS — G44309 Post-traumatic headache, unspecified, not intractable: Secondary | ICD-10-CM | POA: Diagnosis not present

## 2021-06-19 DIAGNOSIS — M48062 Spinal stenosis, lumbar region with neurogenic claudication: Secondary | ICD-10-CM | POA: Diagnosis not present

## 2021-06-27 ENCOUNTER — Ambulatory Visit: Payer: Medicare HMO | Admitting: Cardiology

## 2021-08-10 ENCOUNTER — Ambulatory Visit (INDEPENDENT_AMBULATORY_CARE_PROVIDER_SITE_OTHER): Payer: Medicare (Managed Care) | Admitting: Family Medicine

## 2021-08-10 ENCOUNTER — Other Ambulatory Visit: Payer: Self-pay

## 2021-08-10 VITALS — BP 101/78 | HR 96 | Temp 98.3°F | Ht 63.0 in | Wt 203.0 lb

## 2021-08-10 DIAGNOSIS — E782 Mixed hyperlipidemia: Secondary | ICD-10-CM

## 2021-08-10 DIAGNOSIS — I10 Essential (primary) hypertension: Secondary | ICD-10-CM

## 2021-08-10 DIAGNOSIS — G4733 Obstructive sleep apnea (adult) (pediatric): Secondary | ICD-10-CM

## 2021-08-10 DIAGNOSIS — G43709 Chronic migraine without aura, not intractable, without status migrainosus: Secondary | ICD-10-CM | POA: Insufficient documentation

## 2021-08-10 DIAGNOSIS — M545 Low back pain, unspecified: Secondary | ICD-10-CM

## 2021-08-10 DIAGNOSIS — F329 Major depressive disorder, single episode, unspecified: Secondary | ICD-10-CM

## 2021-08-10 DIAGNOSIS — G43909 Migraine, unspecified, not intractable, without status migrainosus: Secondary | ICD-10-CM

## 2021-08-10 DIAGNOSIS — K219 Gastro-esophageal reflux disease without esophagitis: Secondary | ICD-10-CM

## 2021-08-10 DIAGNOSIS — G8929 Other chronic pain: Secondary | ICD-10-CM | POA: Insufficient documentation

## 2021-08-10 DIAGNOSIS — F449 Dissociative and conversion disorder, unspecified: Secondary | ICD-10-CM

## 2021-08-10 MED ORDER — PANTOPRAZOLE SODIUM 40 MG PO TBEC: 40.0000 mg | DELAYED_RELEASE_TABLET | Freq: Every day | ORAL | 3 refills | Status: DC

## 2021-08-10 MED ORDER — BACLOFEN 10 MG PO TABS: 10.0000 mg | ORAL_TABLET | Freq: Three times a day (TID) | ORAL | 2 refills | Status: DC | PRN

## 2021-08-10 MED ORDER — ROSUVASTATIN CALCIUM 40 MG PO TABS: 40.0000 mg | ORAL_TABLET | Freq: Every day | ORAL | 3 refills | Status: DC

## 2021-08-10 MED ORDER — TOPIRAMATE 25 MG PO TABS: 25.0000 mg | ORAL_TABLET | Freq: Every day | ORAL | 3 refills | Status: DC

## 2021-08-10 NOTE — Patient Instructions (Addendum)
Stop the Flexeril. Use the baclofen as needed.  I am putting in a referral to neurosurgery.  Follow up in 3 months.  Take care  Dr. Lacinda Axon

## 2021-08-11 NOTE — Assessment & Plan Note (Signed)
Blood pressure well controlled.  Continue current medications

## 2021-08-11 NOTE — Assessment & Plan Note (Signed)
Referring to neurosurgery. Stop Flexeril.  Start baclofen as directed.

## 2021-08-11 NOTE — Assessment & Plan Note (Signed)
Stable.  Would like better control, however she is on max dose Crestor.  We will plan to repeat lipid panel in the near future.  We will likely start Zetia.

## 2021-08-11 NOTE — Progress Notes (Signed)
Subjective:  Patient ID: Kendra [name], female    DOB: 07-07-1946  Age: 76 y.o. MRN: [id number]  CC: Chief Complaint  Patient presents with   Establish Care    Pain from right side of head down shoulder. Needs refill on Crestor, Topamax and Protonix    HPI:  76 year old female with chronic migraine, chronic low back pain, history of TIA, history of conversion disorder, history of DVT, hypertension, hyperlipidemia, morbid obesity presents to establish care.  Patient has known chronic back pain.  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  Osteophytes noted in the thoracic spine as well.  Patient has had worsening pain over the past 3 to 4 months.  She localizes the pain to the lower thoracic spine as well as to the lumbar spine.  Patient also complains of pain of the right trapezius radiating to her right shoulder.  Currently she uses Flexeril according to the EMR.  She is not taking any pain medication.  She has previously seen an orthopedist.  No records available regarding this.  She has done physical therapy in the past for her chronic low back pain as well.  Patient recently mated to the hospital for TIA.  Work-up was negative for stroke.  She is currently on aspirin therapy.  She is also on statin therapy.  Hypertension is well controlled.  She is currently taking propranolol, presumptively for both her hypertension as well as migraine.  In regards to her hyperlipidemia, most recent LDL in November was 97.  She is taking max dose Crestor.  Patient Active Problem List   Diagnosis Date Noted   Chronic migraine without aura 08/10/2021   Chronic low back pain 08/10/2021   TIA (transient ischemic attack) 06/02/2021   Conversion disorder    History of DVT (deep vein thrombosis) 12/26/2017   Essential hypertension  12/25/2017   Mixed hyperlipidemia 12/25/2017   Morbid obesity ([place]) 07/12/2010   Depression 03/17/2010   OSA on CPAP 10/12/2009   GERD 03/07/2007    Social Hx   Social History   Socioeconomic History   Marital status: Married    Spouse name: Not on file   Number of children: Not on file   Years of education: Not on file   Highest education level: Not on file  Occupational History   Not on file  Tobacco Use   Smoking status: Never   Smokeless tobacco: Never  Vaping Use   Vaping Use: Never used  Substance and Sexual Activity   Alcohol use: No   Drug use: No   Sexual activity: Never    Birth control/protection: None  Other Topics Concern   Not on file  Social History Narrative   Not on file   Social Determinants of Health   Financial Resource Strain: Not on file  Food Insecurity: Not on file  Transportation Needs: Not on file  Physical Activity: Not on file  Stress: Not on file  Social Connections: Not on file    Review of Systems Per HPI  Objective:  BP 101/78    Pulse 96    Temp 98.3 F (36.8 C) (Oral)    Ht 5\' 3"  (1.6 m)    Wt 203 lb (92.1 kg)    SpO2 98%    BMI 35.96 kg/m   BP/Weight 08/10/2021 06/03/2021 73/53/2992  Systolic BP 426 834 -  Diastolic BP 78 73 -  Wt. (Lbs) 203 - 198.41  BMI 35.96 - 35.15    Physical Exam Constitutional:      Appearance: Normal appearance. She is obese.  HENT:     Head: Normocephalic and atraumatic.  Eyes:     General:        Right eye: No discharge.        Left eye: No discharge.     Conjunctiva/sclera: Conjunctivae normal.  Cardiovascular:     Rate and Rhythm: Normal rate and regular rhythm.  Pulmonary:     Effort: Pulmonary effort is normal.     Breath sounds: Normal breath sounds. No wheezing, rhonchi or rales.  Musculoskeletal:     Comments: Tenderness over the paraspinal musculature of the lumbar spine.  Patient also has tenderness of the right trapezius. Patient has difficulty with gait due to her pain.   Neurological:     Mental Status: She is alert.  Psychiatric:        Mood and Affect: Mood normal.        Behavior: Behavior normal.    Lab Results  Component Value Date   WBC 6.3 06/02/2021   HGB 13.3 06/02/2021   HCT 39.0 06/02/2021   PLT 221 06/02/2021   GLUCOSE 103 (H) 06/02/2021   CHOL 174 06/03/2021   TRIG 153 (H) 06/03/2021   HDL 46 06/03/2021   LDLCALC 97 06/03/2021   ALT 18 06/02/2021   AST 28 06/02/2021   NA 140 06/02/2021   K 4.0 06/02/2021   CL 102 06/02/2021   CREATININE 0.80 06/02/2021   BUN 17 06/02/2021   CO2 29 06/02/2021   TSH 1.470 08/29/2020   INR 1.0 06/02/2021   HGBA1C 5.8 (H) 06/02/2021     Assessment & Plan:   Problem List Items Addressed This Visit       Cardiovascular and Mediastinum   Essential hypertension    Blood pressure well controlled.  Continue current medications.      Relevant Medications   rosuvastatin (CRESTOR) 40 MG tablet     Digestive   GERD   Relevant Medications   pantoprazole (PROTONIX) 40 MG tablet     Other   Mixed hyperlipidemia    Stable.  Would like better control, however she is on max dose Crestor.  We will plan to repeat lipid panel in the near future.  We will likely start Zetia.      Relevant Medications   rosuvastatin (CRESTOR) 40 MG tablet   Chronic low back pain - Primary    Referring to neurosurgery. Stop Flexeril.  Start baclofen as directed.      Relevant Medications   topiramate (TOPAMAX) 25 MG tablet   baclofen (LIORESAL) 10 MG tablet   Other Relevant Orders   Ambulatory referral to Neurosurgery    Meds ordered this encounter  Medications   rosuvastatin (CRESTOR) 40 MG tablet    Sig: Take 1 tablet (40 mg total) by mouth daily.    Dispense:  90 tablet    Refill:  3   pantoprazole (PROTONIX) 40 MG tablet    Sig: Take 1 tablet (40 mg total) by mouth daily.    Dispense:  90 tablet    Refill:  3   topiramate (TOPAMAX) 25 MG tablet    Sig: Take 1 tablet (25 mg total) by mouth  daily.    Dispense:  90 tablet    Refill:  3   baclofen (LIORESAL) 10 MG tablet  Sig: Take 1 tablet (10 mg total) by mouth 3 (three) times daily as needed for muscle spasms.    Dispense:  30 each    Refill:  2    Follow-up:  Return in about 3 months (around 11/08/2021).  [place]

## 2021-08-24 ENCOUNTER — Ambulatory Visit (INDEPENDENT_AMBULATORY_CARE_PROVIDER_SITE_OTHER): Payer: Medicare (Managed Care) | Admitting: Cardiology

## 2021-08-24 ENCOUNTER — Encounter: Payer: Self-pay | Admitting: Cardiology

## 2021-08-24 ENCOUNTER — Other Ambulatory Visit: Payer: Self-pay

## 2021-08-24 VITALS — BP 114/82 | HR 82 | Ht 63.0 in | Wt 207.0 lb

## 2021-08-24 DIAGNOSIS — I10 Essential (primary) hypertension: Secondary | ICD-10-CM | POA: Diagnosis not present

## 2021-08-24 DIAGNOSIS — R002 Palpitations: Secondary | ICD-10-CM

## 2021-08-24 DIAGNOSIS — E782 Mixed hyperlipidemia: Secondary | ICD-10-CM

## 2021-08-24 DIAGNOSIS — R0789 Other chest pain: Secondary | ICD-10-CM | POA: Diagnosis not present

## 2021-08-24 MED ORDER — PROPRANOLOL HCL 20 MG PO TABS: 10.0000 mg | ORAL_TABLET | Freq: Two times a day (BID) | ORAL | 3 refills | Status: DC

## 2021-08-24 MED ORDER — LOSARTAN POTASSIUM 100 MG PO TABS: 100.0000 mg | ORAL_TABLET | Freq: Every day | ORAL | 3 refills | Status: DC

## 2021-08-24 MED ORDER — ROSUVASTATIN CALCIUM 40 MG PO TABS: 40.0000 mg | ORAL_TABLET | Freq: Every day | ORAL | 3 refills | Status: DC

## 2021-08-24 NOTE — Patient Instructions (Signed)
Medication Instructions:  START Aspirin 81 mg daily  Labwork: FASTING LIPID PANEL- NOTHING TO EAT OR DRINK AT LEAST 8 HOURS PRIOR TO HAVING LABS DRAWN  Follow-Up: Follow up with Dr. Harl Bowie in 1 year.  If you need a refill on your cardiac medications before your next appointment, please call your pharmacy.

## 2021-08-24 NOTE — Progress Notes (Signed)
Clinical Summary Kendra [name] is a 76 y.o.female former patient of Dr Bronson Ing, last seen by PA Leonides Sake. This is our first visit together.  She is from [place] speaking. A medical interpreter is present for the visit.   1.History of chest pain 10/2020 nuclear stress: no ischemia 05/2021 echo: 55-60%, no WMAs, indet diastolic - no recent chest pains   2.Palpitations 10/2020 monitor: rare ectopy, isoalted 4 beat run of NSVT - occasional palpitations, somewhat often.  - she ran out of propranolol 2 weeks ago.   3.HTN - compliant with meds  4. Hyperlipidemia 05/2021 TC 174 TG 153 HDL 46 LDL 97 - she is on crestor 40mg  daily, started about 3 months ago   5. TIA - admission 05/2021 -  Past Medical History:  Diagnosis Date   Arthritis    Chronic back pain    Conversion disorder    caused by stress of daughter's death in 21-Oct-2009   Depression    GERD (gastroesophageal reflux disease)    Hypercholesterolemia    Hypertension    Kidney stone    stones   Migraine    Panic attacks    daily, worse the 15th of each month   Thyroid disease      Allergies  Allergen Reactions   Hydrocodone Nausea And Vomiting   Morphine Swelling    REACTION: swelling   Pineapple Swelling    Throat swelling     Current Outpatient Medications  Medication Sig Dispense Refill   amitriptyline (ELAVIL) 50 MG tablet Take 1 tablet (50 mg total) by mouth at bedtime. 90 tablet 1   aspirin EC 81 MG EC tablet Take 1 tablet (81 mg total) by mouth daily. Swallow whole. (Patient not taking: Reported on 08/10/2021) 30 tablet 1   baclofen (LIORESAL) 10 MG tablet Take 1 tablet (10 mg total) by mouth 3 (three) times daily as needed for muscle spasms. 30 each 2   nitroGLYCERIN (NITROSTAT) 0.4 MG SL tablet Place 1 tablet (0.4 mg total) under the tongue every 5 (five) minutes x 3 doses as needed for chest pain. 30 tablet 12   pantoprazole (PROTONIX) 40 MG tablet Take 1 tablet (40 mg total) by mouth  daily. 90 tablet 3   propranolol (INDERAL) 20 MG tablet Take 0.5 tablets (10 mg total) by mouth 2 (two) times daily. 180 tablet 1   risperiDONE (RISPERDAL) 0.5 MG tablet Take 1 tablet (0.5 mg total) by mouth at bedtime. 90 tablet 1   rosuvastatin (CRESTOR) 40 MG tablet Take 1 tablet (40 mg total) by mouth daily. 90 tablet 3   sertraline (ZOLOFT) 50 MG tablet Take 150 mg by mouth at bedtime.     solifenacin (VESICARE) 10 MG tablet Take 10 mg by mouth daily.     topiramate (TOPAMAX) 25 MG tablet Take 1 tablet (25 mg total) by mouth daily. 90 tablet 3   No current facility-administered medications for this visit.     Past Surgical History:  Procedure Laterality Date   ABDOMINAL HYSTERECTOMY     r ovary removal   APPENDECTOMY     CHOLECYSTECTOMY     CYSTOSCOPY     LEFT HEART CATH AND CORONARY ANGIOGRAPHY N/A 12/26/2017   Procedure: LEFT HEART CATH AND CORONARY ANGIOGRAPHY;  Surgeon: Lorretta Harp, MD;  Location: [facility];  Service: Cardiovascular;  Laterality: N/A;   THYROID SURGERY       Allergies  Allergen Reactions   Hydrocodone Nausea And Vomiting  Morphine Swelling    REACTION: swelling   Pineapple Swelling    Throat swelling      Family History  Problem Relation Age of Onset   Arthritis Mother    Migraines Mother    Cancer Sister    Cancer Maternal Grandmother      Social History Kendra [name] reports that she has never smoked. She has never used smokeless tobacco. Kendra [name] reports no history of alcohol use.   Review of Systems CONSTITUTIONAL: No weight loss, fever, chills, weakness or fatigue.  HEENT: Eyes: No visual loss, blurred vision, double vision or yellow sclerae.No hearing loss, sneezing, congestion, runny nose or sore throat.  SKIN: No rash or itching.  CARDIOVASCULAR: per hpi RESPIRATORY: No shortness of breath, cough or sputum.  GASTROINTESTINAL: No anorexia, nausea, vomiting or diarrhea. No abdominal pain or blood.  GENITOURINARY:  No burning on urination, no polyuria NEUROLOGICAL: No headache, dizziness, syncope, paralysis, ataxia, numbness or tingling in the extremities. No change in bowel or bladder control.  MUSCULOSKELETAL: No muscle, back pain, joint pain or stiffness.  LYMPHATICS: No enlarged nodes. No history of splenectomy.  PSYCHIATRIC: No history of depression or anxiety.  ENDOCRINOLOGIC: No reports of sweating, cold or heat intolerance. No polyuria or polydipsia.  Marland Kitchen   Physical Examination Today's Vitals   08/24/21 0916  BP: 114/82  Pulse: 82  SpO2: 99%  Weight: 207 lb (93.9 kg)  Height: 5\' 3"  (1.6 m)   Body mass index is 36.67 kg/m.  Gen: resting comfortably, no acute distress HEENT: no scleral icterus, pupils equal round and reactive, no palptable cervical adenopathy,  CV: RRR, no m/r/g no jvd Resp: Clear to auscultation bilaterally GI: abdomen is soft, non-tender, non-distended, normal bowel sounds, no hepatosplenomegaly MSK: extremities are warm, no edema.  Skin: warm, no rash Neuro:  no focal deficits Psych: appropriate affect   Diagnostic Studies  10/2020 nuclear stress Lexiscan stress is electrically negative for ischemia Myoview scan is electrically negative for ischemia LVEF is calculated at 47% Consider echocardiogram to further define LVEF / wall motion Overall low risk scan     Assessment and Plan  Chest pain - no recent symptoms, recent negative stress test - continue to monitor  2. HTN - at goal, continue current meds. Has been on losartan 100mg  daily  3. Palpitations - recent symptoms, she ran out of propanolol 2 weeks ago. We will refill - prior monitor overall benign, rare ectopy  4. Hyperlipidemia - repeat lipid panel, with recent TIA needs aggressive cholesterol control     F/u 1 year   Arnoldo Lenis, M.D.

## 2021-08-28 ENCOUNTER — Other Ambulatory Visit (HOSPITAL_COMMUNITY)
Admission: RE | Admit: 2021-08-28 | Discharge: 2021-08-28 | Disposition: A | Discharge status: Home / Self Care | Payer: Medicare (Managed Care) | Source: Ambulatory Visit | Attending: Cardiology | Admitting: Cardiology

## 2021-08-28 DIAGNOSIS — E782 Mixed hyperlipidemia: Secondary | ICD-10-CM | POA: Diagnosis present

## 2021-08-28 LAB — LIPID PANEL
Cholesterol: 186 mg/dL (ref 0–200)
HDL: 57 mg/dL (ref >40)
LDL Cholesterol: 105 mg/dL — ABNORMAL HIGH (ref 0–99)
Total CHOL/HDL Ratio: 3.3 RATIO
Triglycerides: 121 mg/dL (ref <150)
VLDL: 24 mg/dL (ref 0–40)

## 2021-08-31 NOTE — Telephone Encounter (Signed)
Erroneous encounter. Please disregard.

## 2021-09-04 ENCOUNTER — Telehealth: Payer: Self-pay | Admitting: Family Medicine

## 2021-09-04 NOTE — Telephone Encounter (Signed)
Kendra [name] requesting refill on Amitriptyline 25 mg take one tablet po at bedtime and Risperdal 0.5 mg take one tablet po daily. Pt last seen 08/10/21 for back pain. Please advise. Thank you

## 2021-09-04 NOTE — Telephone Encounter (Signed)
Also requesting Sertraline 100 mg one tablet daily

## 2021-09-05 ENCOUNTER — Other Ambulatory Visit: Payer: Self-pay | Admitting: Family Medicine

## 2021-09-05 MED ORDER — RISPERIDONE 0.5 MG PO TABS
0.5000 mg | ORAL_TABLET | Freq: Every day | ORAL | 1 refills | Status: DC
Start: 2021-09-05 — End: 2023-08-15

## 2021-09-05 MED ORDER — AMITRIPTYLINE HCL 50 MG PO TABS: 50.0000 mg | ORAL_TABLET | Freq: Every day | ORAL | 1 refills | Status: DC

## 2021-09-05 MED ORDER — SERTRALINE HCL 50 MG PO TABS: 150.0000 mg | ORAL_TABLET | Freq: Every day | ORAL | 1 refills | Status: DC

## 2021-09-08 ENCOUNTER — Other Ambulatory Visit: Payer: Self-pay

## 2021-09-08 ENCOUNTER — Encounter: Payer: Self-pay | Admitting: Family Medicine

## 2021-09-08 ENCOUNTER — Ambulatory Visit (INDEPENDENT_AMBULATORY_CARE_PROVIDER_SITE_OTHER): Payer: Medicare (Managed Care) | Admitting: Family Medicine

## 2021-09-08 DIAGNOSIS — G8929 Other chronic pain: Secondary | ICD-10-CM | POA: Diagnosis not present

## 2021-09-08 DIAGNOSIS — M545 Low back pain, unspecified: Secondary | ICD-10-CM | POA: Diagnosis not present

## 2021-09-08 MED ORDER — OXYCODONE-ACETAMINOPHEN 5-325 MG PO TABS: 1.0000 | ORAL_TABLET | Freq: Three times a day (TID) | ORAL | 0 refills | Status: DC | PRN

## 2021-09-08 NOTE — Patient Instructions (Signed)
Utilizar el medicamento para el dolor como se indica.  Estamos llamando a la oficina de neurociruga para conseguirle una cita.  Seguimiento en 3 meses.

## 2021-09-11 NOTE — Assessment & Plan Note (Addendum)
Chronic problem.  Worsening.  Short supply of oxycodone given for her pain.  Trying to arrange referral to either neurosurgery or orthopedics with subspecialty in spine.  Needs consideration for surgery and/or injections.

## 2021-09-11 NOTE — Progress Notes (Signed)
Subjective:  Patient ID: Kendra [name], female    DOB: 1945-12-17  Age: 76 y.o. MRN: [id number]  CC: Chief Complaint  Patient presents with   Back Pain    Pt having ongoing back pain ; has not heard from neurosurgery at this time. Pt states every day pain is getting worse/stronger.     HPI:  76 year old female with hypertension, migraine, OSA, history of conversion disorder, and chronic low back pain presents with worsening low back pain.  Patient previously seen by me.  Was placed on baclofen.  States that her pain is worsening.  Localizes the pain to the midline of the low back.  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  I previously placed a referral to neurosurgery.  We contacted them today and they informed us that they do not take her insurance.  Pain is worse with activity.  No relieving factors.  No saddle anesthesia or incontinence.  Patient Active Problem List   Diagnosis Date Noted   Chronic migraine without aura 08/10/2021   Chronic low back pain 08/10/2021   TIA (transient ischemic attack) 06/02/2021   Conversion disorder    History of DVT (deep vein thrombosis) 12/26/2017   Essential hypertension 12/25/2017   Mixed hyperlipidemia 12/25/2017   Depression 03/17/2010   OSA on CPAP 10/12/2009   GERD 03/07/2007    Social Hx   Social History   Socioeconomic History   Marital status: Married    Spouse name: Not on file   Number of children: Not on file   Years of education: Not on file   Highest education level: Not on file  Occupational History   Not on file  Tobacco Use   Smoking status: Never   Smokeless tobacco: Never  Vaping Use   Vaping Use: Never used  Substance and Sexual Activity   Alcohol use: No   Drug use: No   Sexual activity: Never    Birth control/protection: None  Other Topics Concern   Not on file   Social History Narrative   Not on file   Social Determinants of Health   Financial Resource Strain: Not on file  Food Insecurity: Not on file  Transportation Needs: Not on file  Physical Activity: Not on file  Stress: Not on file  Social Connections: Not on file    Review of Systems Per HPI  Objective:  BP 111/64    Pulse 72    Temp 97.8 F (36.6 C)    Wt 202 lb 9.6 oz (91.9 kg)    BMI 35.89 kg/m   BP/Weight 09/08/2021 08/24/2021 0/86/5784  Systolic BP 696 295 284  Diastolic BP 64 82 78  Wt. (Lbs) 202.6 207 203  BMI 35.89 36.67 35.96    Physical Exam Vitals and nursing note reviewed.  Constitutional:      Appearance: Normal appearance. She is obese.  HENT:     Head: Normocephalic and atraumatic.  Cardiovascular:     Rate and Rhythm: Normal rate and regular rhythm.  Pulmonary:     Effort: Pulmonary effort is normal.     Breath sounds: Normal breath sounds.  Musculoskeletal:     Comments: Tenderness over the lumbar spine.  Neurological:     Mental Status: She is alert.    Lab Results  Component Value Date   WBC 6.3 06/02/2021   HGB 13.3 06/02/2021  HCT 39.0 06/02/2021   PLT 221 06/02/2021   GLUCOSE 103 (H) 06/02/2021   CHOL 186 08/28/2021   TRIG 121 08/28/2021   HDL 57 08/28/2021   LDLCALC 105 (H) 08/28/2021   ALT 18 06/02/2021   AST 28 06/02/2021   NA 140 06/02/2021   K 4.0 06/02/2021   CL 102 06/02/2021   CREATININE 0.80 06/02/2021   BUN 17 06/02/2021   CO2 29 06/02/2021   TSH 1.470 08/29/2020   INR 1.0 06/02/2021   HGBA1C 5.8 (H) 06/02/2021     Assessment & Plan:   Problem List Items Addressed This Visit       Other   Chronic low back pain    Chronic problem.  Worsening.  Short supply of oxycodone given for her pain.  Trying to arrange referral to either neurosurgery or orthopedics with subspecialty in spine.  Needs consideration for surgery and/or injections.      Relevant Medications   oxyCODONE-acetaminophen (PERCOCET/ROXICET) 5-325  MG tablet   Other Relevant Orders   Ambulatory referral to Orthopedic Surgery    Meds ordered this encounter  Medications   oxyCODONE-acetaminophen (PERCOCET/ROXICET) 5-325 MG tablet    Sig: Take 1 tablet by mouth every 8 (eight) hours as needed for severe pain.    Dispense:  15 tablet    Refill:  0    Follow-up:  Return in about 3 months (around 12/06/2021).  [place]

## 2021-09-13 ENCOUNTER — Telehealth: Payer: Self-pay

## 2021-09-13 MED ORDER — EZETIMIBE 10 MG PO TABS: 10.0000 mg | ORAL_TABLET | Freq: Every day | ORAL | 3 refills | Status: DC

## 2021-09-13 NOTE — Telephone Encounter (Signed)
-----   Message from Arnoldo Lenis, MD sent at 09/03/2021  9:30 AM EST ----- Cholesterol too high, please add zetia 10mg  daily in addition to her current regimen  Zandra Abts MD

## 2021-09-13 NOTE — Telephone Encounter (Signed)
I spoke with Mr.Brave as he said Mrs.Casella was sick.   I discussed lab results with him and he agrees to pick up Zetia 10 mg qd for wife. He requested 90 day supply,e-scribed to [facility]

## 2021-11-08 ENCOUNTER — Ambulatory Visit (INDEPENDENT_AMBULATORY_CARE_PROVIDER_SITE_OTHER): Payer: Medicare (Managed Care) | Admitting: Family Medicine

## 2021-11-08 ENCOUNTER — Encounter: Payer: Self-pay | Admitting: Family Medicine

## 2021-11-08 VITALS — BP 120/87 | HR 88 | Temp 96.6°F | Wt 204.2 lb

## 2021-11-08 DIAGNOSIS — N3000 Acute cystitis without hematuria: Secondary | ICD-10-CM

## 2021-11-08 DIAGNOSIS — G8929 Other chronic pain: Secondary | ICD-10-CM | POA: Diagnosis not present

## 2021-11-08 DIAGNOSIS — N390 Urinary tract infection, site not specified: Secondary | ICD-10-CM | POA: Insufficient documentation

## 2021-11-08 DIAGNOSIS — M545 Low back pain, unspecified: Secondary | ICD-10-CM | POA: Diagnosis not present

## 2021-11-08 DIAGNOSIS — G43709 Chronic migraine without aura, not intractable, without status migrainosus: Secondary | ICD-10-CM

## 2021-11-08 DIAGNOSIS — R7303 Prediabetes: Secondary | ICD-10-CM | POA: Insufficient documentation

## 2021-11-08 LAB — POCT URINALYSIS DIPSTICK
Protein, UA: POSITIVE — AB
Spec Grav, UA: 1.015 (ref 1.010–1.025)
Urobilinogen, UA: 1 E.U./dL
pH, UA: 7 (ref 5.0–8.0)

## 2021-11-08 MED ORDER — CEPHALEXIN 500 MG PO CAPS: 500.0000 mg | ORAL_CAPSULE | Freq: Two times a day (BID) | ORAL | 0 refills | Status: DC

## 2021-11-08 MED ORDER — TOPIRAMATE 50 MG PO TABS
50.0000 mg | ORAL_TABLET | Freq: Every day | ORAL | 1 refills | Status: DC
Start: 2021-11-08 — End: 2023-04-01

## 2021-11-08 NOTE — Patient Instructions (Addendum)
We are going to get her another appt with the neurosurgeon as well as a neurologist regarding her headaches. ? ?I have increased her Topamax to aid in her headaches.  ? ?Follow up in 6 months. ? ?Take care ? ?Dr. Lacinda Axon  ?

## 2021-11-08 NOTE — Assessment & Plan Note (Signed)
Treating with Keflex.  Sending culture. ?

## 2021-11-08 NOTE — Assessment & Plan Note (Signed)
Patient missed her appointment with neurosurgery.  We will see if we can reschedule her appointment for her. ?

## 2021-11-08 NOTE — Progress Notes (Signed)
? ?Subjective:  ?Patient ID: Kendra [name], female    DOB: [birth year]  Age: 76 y.o. MRN: [id number] ? ?CC: ?Chief Complaint  ?Patient presents with  ? Follow-up  ?  Back pain is getting worse. Missed appt with specialist due to emergency, but husband is going to get another appt  ? ? ?HPI: ? ?76 female presents for follow-up. ? ?Patient reports that for the past week she has been experiencing dysuria.  She is concerned that she has a urinary tract infection.  No abdominal pain.  No fever. ? ?Patient continues to experience significant back pain.  Had an appointment with neurosurgery but missed the appointment due to a "emergency".  They are working on rescheduling. ? ?Patient reports persistent headache.  Occurring daily.  She is on several medications regarding migraine.  Has not seen neurology in the recent past. ? ?Patient Active Problem List  ? Diagnosis Date Noted  ? Prediabetes 11/08/2021  ? UTI (urinary tract infection) 11/08/2021  ? Chronic migraine without aura 08/10/2021  ? Chronic low back pain 08/10/2021  ? TIA (transient ischemic attack) 06/02/2021  ? Conversion disorder   ? History of DVT (deep vein thrombosis) 12/26/2017  ? Essential hypertension 12/25/2017  ? Mixed hyperlipidemia 12/25/2017  ? Depression 03/17/2010  ? OSA on CPAP 10/12/2009  ? GERD 03/07/2007  ? ? ?Social Hx   ?Social History  ? ?Socioeconomic History  ? Marital status: Married  ?  Spouse name: Not on file  ? Number of children: Not on file  ? Years of education: Not on file  ? Highest education level: Not on file  ?Occupational History  ? Not on file  ?Tobacco Use  ? Smoking status: Never  ? Smokeless tobacco: Never  ?Vaping Use  ? Vaping Use: Never used  ?Substance and Sexual Activity  ? Alcohol use: No  ? Drug use: No  ? Sexual activity: Never  ?  Birth control/protection: None  ?Other Topics Concern  ? Not on file  ?Social History Narrative  ? Not on file  ? ?Social Determinants of Health  ? ?Financial Resource Strain: Not on  file  ?Food Insecurity: Not on file  ?Transportation Needs: Not on file  ?Physical Activity: Not on file  ?Stress: Not on file  ?Social Connections: Not on file  ? ? ?Review of Systems ?Per HPI ? ?Objective:  ?BP 120/87   Pulse 88   Temp (!) 96.6 ?F (35.9 ?C)   Wt 204 lb 3.2 oz (92.6 kg)   SpO2 95%   BMI 36.17 kg/m?  ? ? ?  11/08/2021  ?  1:23 PM 09/08/2021  ?  9:46 AM 08/24/2021  ?  9:16 AM  ?BP/Weight  ?Systolic BP 160 737 106  ?Diastolic BP 87 64 82  ?Wt. (Lbs) 204.2 202.6 207  ?BMI 36.17 kg/m2 35.89 kg/m2 36.67 kg/m2  ? ? ?Physical Exam ?Vitals and nursing note reviewed.  ?Constitutional:   ?   General: She is not in acute distress. ?   Appearance: Normal appearance.  ?HENT:  ?   Head: Normocephalic and atraumatic.  ?Eyes:  ?   General:     ?   Right eye: No discharge.     ?   Left eye: No discharge.  ?   Conjunctiva/sclera: Conjunctivae normal.  ?Cardiovascular:  ?   Rate and Rhythm: Normal rate and regular rhythm.  ?Pulmonary:  ?   Effort: Pulmonary effort is normal.  ?   Breath sounds: Normal breath sounds.  ?  Neurological:  ?   Mental Status: She is alert.  ?Psychiatric:  ?   Comments: Flat affect.  ? ? ?Lab Results  ?Component Value Date  ? WBC 6.3 06/02/2021  ? HGB 13.3 06/02/2021  ? HCT 39.0 06/02/2021  ? PLT 221 06/02/2021  ? GLUCOSE 103 (H) 06/02/2021  ? CHOL 186 08/28/2021  ? TRIG 121 08/28/2021  ? HDL 57 08/28/2021  ? LDLCALC 105 (H) 08/28/2021  ? ALT 18 06/02/2021  ? AST 28 06/02/2021  ? NA 140 06/02/2021  ? K 4.0 06/02/2021  ? CL 102 06/02/2021  ? CREATININE 0.80 06/02/2021  ? BUN 17 06/02/2021  ? CO2 29 06/02/2021  ? TSH 1.470 08/29/2020  ? INR 1.0 06/02/2021  ? HGBA1C 5.8 (H) 06/02/2021  ? ? ? ?Assessment & Plan:  ? ?Problem List Items Addressed This Visit   ? ?  ? Cardiovascular and Mediastinum  ? Chronic migraine without aura  ?  Uncontrolled.  Patient continues to have frequent headaches.  She is on amitriptyline, propranolol, and Topamax which all are used for migraine prevention.  Given her 76, I do not recommend use of triptans.  Needs to see neurology for further evaluation and management.  Will place referral.  Increasing Topamax. ? ?  ?  ? Relevant Medications  ? topiramate (TOPAMAX) 50 MG tablet  ? Other Relevant Orders  ? Ambulatory referral to Neurology  ?  ? Genitourinary  ? UTI (urinary tract infection)  ?  Treating with Keflex.  Sending culture. ? ?  ?  ? Relevant Medications  ? cephALEXin (KEFLEX) 500 MG capsule  ? Other Relevant Orders  ? POCT Urinalysis Dipstick (Completed)  ? Urine Culture  ?  ? Other  ? Chronic low back pain - Primary  ?  Patient missed her appointment with neurosurgery.  We will see if we can reschedule her appointment for her. ? ?  ?  ? Relevant Medications  ? topiramate (TOPAMAX) 50 MG tablet  ? ? ?Meds ordered this encounter  ?Medications  ? topiramate (TOPAMAX) 50 MG tablet  ?  Sig: Take 1 tablet (50 mg total) by mouth daily.  ?  Dispense:  90 tablet  ?  Refill:  1  ? cephALEXin (KEFLEX) 500 MG capsule  ?  Sig: Take 1 capsule (500 mg total) by mouth 2 (two) times daily.  ?  Dispense:  14 capsule  ?  Refill:  0  ? ? ?Thersa Salt DO ?[place] ? ?

## 2021-11-08 NOTE — Assessment & Plan Note (Addendum)
Uncontrolled.  Patient continues to have frequent headaches.  She is on amitriptyline, propranolol, and Topamax which all are used for migraine prevention.  Given her age, I do not recommend use of triptans.  Needs to see neurology for further evaluation and management.  Will place referral.  Increasing Topamax. ?

## 2021-11-11 LAB — URINE CULTURE

## 2021-11-11 LAB — SPECIMEN STATUS REPORT

## 2021-11-13 ENCOUNTER — Telehealth: Payer: Self-pay

## 2021-11-13 NOTE — Telephone Encounter (Signed)
Encourage patient to contact the pharmacy for refills or they can request refills through [facility] ? ?(Please schedule appointment if patient has not been seen in over a year) ? ? ? ?WHAT PHARMACY WOULD THEY LIKE THIS SENT TO:  ?[place], [place] [address]  ?[address] [place], [place] [address]  ?MEDICATION NAME & DOSE:solifenacin (VESICARE) 10 MG tablet  ? ?NOTES/COMMENTS FROM PATIENT: ? ? ? ? ? ?[place] office please notify patient: ?It takes 48-72 hours to process rx refill requests ?Ask patient to call pharmacy to ensure rx is ready before heading there.  ? ?

## 2021-11-14 ENCOUNTER — Other Ambulatory Visit: Payer: Self-pay | Admitting: *Deleted

## 2021-11-14 MED ORDER — SOLIFENACIN SUCCINATE 10 MG PO TABS: 10.0000 mg | ORAL_TABLET | Freq: Every day | ORAL | 0 refills | Status: DC

## 2021-11-14 NOTE — Telephone Encounter (Signed)
Rx refilled.

## 2021-11-22 ENCOUNTER — Ambulatory Visit: Payer: Medicare HMO | Admitting: Urology

## 2021-12-08 ENCOUNTER — Ambulatory Visit: Payer: Medicare (Managed Care) | Admitting: Family Medicine

## 2022-01-30 ENCOUNTER — Other Ambulatory Visit (HOSPITAL_COMMUNITY): Payer: Self-pay | Admitting: Internal Medicine

## 2022-01-30 DIAGNOSIS — Z1231 Encounter for screening mammogram for malignant neoplasm of breast: Secondary | ICD-10-CM

## 2022-02-05 ENCOUNTER — Ambulatory Visit (HOSPITAL_COMMUNITY)
Admission: RE | Admit: 2022-02-05 | Discharge: 2022-02-05 | Disposition: A | Discharge status: Home / Self Care | Payer: Medicare (Managed Care) | Source: Ambulatory Visit | Attending: Internal Medicine | Admitting: Internal Medicine

## 2022-02-05 DIAGNOSIS — Z1231 Encounter for screening mammogram for malignant neoplasm of breast: Secondary | ICD-10-CM | POA: Diagnosis present

## 2022-02-13 ENCOUNTER — Emergency Department (HOSPITAL_COMMUNITY): Payer: Medicare (Managed Care)

## 2022-02-13 ENCOUNTER — Encounter (HOSPITAL_COMMUNITY): Payer: Self-pay | Admitting: Emergency Medicine

## 2022-02-13 ENCOUNTER — Observation Stay (HOSPITAL_COMMUNITY)
Admission: EM | Admit: 2022-02-13 | Discharge: 2022-02-15 | Disposition: A | Discharge status: Home Health | Payer: Medicare (Managed Care) | Attending: Internal Medicine | Admitting: Internal Medicine

## 2022-02-13 DIAGNOSIS — R4701 Aphasia: Secondary | ICD-10-CM | POA: Diagnosis present

## 2022-02-13 DIAGNOSIS — R079 Chest pain, unspecified: Secondary | ICD-10-CM

## 2022-02-13 DIAGNOSIS — Z7982 Long term (current) use of aspirin: Secondary | ICD-10-CM | POA: Diagnosis not present

## 2022-02-13 DIAGNOSIS — R2689 Other abnormalities of gait and mobility: Secondary | ICD-10-CM | POA: Insufficient documentation

## 2022-02-13 DIAGNOSIS — F444 Conversion disorder with motor symptom or deficit: Secondary | ICD-10-CM | POA: Diagnosis not present

## 2022-02-13 DIAGNOSIS — M6281 Muscle weakness (generalized): Secondary | ICD-10-CM | POA: Diagnosis not present

## 2022-02-13 DIAGNOSIS — I639 Cerebral infarction, unspecified: Secondary | ICD-10-CM

## 2022-02-13 DIAGNOSIS — E782 Mixed hyperlipidemia: Secondary | ICD-10-CM | POA: Diagnosis present

## 2022-02-13 DIAGNOSIS — Z20822 Contact with and (suspected) exposure to covid-19: Secondary | ICD-10-CM | POA: Insufficient documentation

## 2022-02-13 DIAGNOSIS — K219 Gastro-esophageal reflux disease without esophagitis: Secondary | ICD-10-CM | POA: Diagnosis not present

## 2022-02-13 DIAGNOSIS — Z79899 Other long term (current) drug therapy: Secondary | ICD-10-CM | POA: Diagnosis not present

## 2022-02-13 DIAGNOSIS — Z7901 Long term (current) use of anticoagulants: Secondary | ICD-10-CM | POA: Diagnosis not present

## 2022-02-13 DIAGNOSIS — I10 Essential (primary) hypertension: Secondary | ICD-10-CM | POA: Diagnosis not present

## 2022-02-13 DIAGNOSIS — F449 Dissociative and conversion disorder, unspecified: Secondary | ICD-10-CM | POA: Diagnosis present

## 2022-02-13 DIAGNOSIS — I69320 Aphasia following cerebral infarction: Secondary | ICD-10-CM | POA: Diagnosis not present

## 2022-02-13 DIAGNOSIS — G43709 Chronic migraine without aura, not intractable, without status migrainosus: Secondary | ICD-10-CM | POA: Diagnosis not present

## 2022-02-13 DIAGNOSIS — R072 Precordial pain: Secondary | ICD-10-CM | POA: Insufficient documentation

## 2022-02-13 LAB — COMPREHENSIVE METABOLIC PANEL
ALT: 19 U/L (ref 0–44)
AST: 22 U/L (ref 15–41)
Albumin: 3.9 g/dL (ref 3.5–5.0)
Alkaline Phosphatase: 86 U/L (ref 38–126)
Anion gap: 6 (ref 5–15)
BUN: 16 mg/dL (ref 8–23)
CO2: 25 mmol/L (ref 22–32)
Calcium: 9 mg/dL (ref 8.9–10.3)
Chloride: 107 mmol/L (ref 98–111)
Creatinine, Ser: 0.65 mg/dL (ref 0.44–1.00)
GFR, Estimated: >60 mL/min (ref >60)
Glucose, Bld: 125 mg/dL — ABNORMAL HIGH (ref 70–99)
Potassium: 3.7 mmol/L (ref 3.5–5.1)
Sodium: 138 mmol/L (ref 135–145)
Total Bilirubin: 0.7 mg/dL (ref 0.3–1.2)
Total Protein: 6.8 g/dL (ref 6.5–8.1)

## 2022-02-13 LAB — PROTIME-INR
INR: 1 (ref 0.8–1.2)
Prothrombin Time: 12.9 seconds (ref 11.4–15.2)

## 2022-02-13 LAB — URINALYSIS, ROUTINE W REFLEX MICROSCOPIC
Bacteria, UA: NONE SEEN
Bilirubin Urine: NEGATIVE
Glucose, UA: NEGATIVE mg/dL
Ketones, ur: NEGATIVE mg/dL
Nitrite: NEGATIVE
Protein, ur: NEGATIVE mg/dL
Specific Gravity, Urine: 1.004 — ABNORMAL LOW (ref 1.005–1.030)
pH: 8 (ref 5.0–8.0)

## 2022-02-13 LAB — CBC
HCT: 43.1 % (ref 36.0–46.0)
Hemoglobin: 14.2 g/dL (ref 12.0–15.0)
MCH: 32.1 pg (ref 26.0–34.0)
MCHC: 32.9 g/dL (ref 30.0–36.0)
MCV: 97.3 fL (ref 80.0–100.0)
Platelets: 243 10*3/uL (ref 150–400)
RBC: 4.43 MIL/uL (ref 3.87–5.11)
RDW: 13.9 % (ref 11.5–15.5)
WBC: 10.4 10*3/uL (ref 4.0–10.5)
nRBC: 0 % (ref 0.0–0.2)

## 2022-02-13 LAB — DIFFERENTIAL
Abs Immature Granulocytes: 0.04 10*3/uL (ref 0.00–0.07)
Basophils Absolute: 0.1 10*3/uL (ref 0.0–0.1)
Basophils Relative: 1 %
Eosinophils Absolute: 0.3 10*3/uL (ref 0.0–0.5)
Eosinophils Relative: 3 %
Immature Granulocytes: 0 %
Lymphocytes Relative: 17 %
Lymphs Abs: 1.8 10*3/uL (ref 0.7–4.0)
Monocytes Absolute: 0.7 10*3/uL (ref 0.1–1.0)
Monocytes Relative: 7 %
Neutro Abs: 7.4 10*3/uL (ref 1.7–7.7)
Neutrophils Relative %: 72 %

## 2022-02-13 LAB — RAPID URINE DRUG SCREEN, HOSP PERFORMED
Amphetamines: NOT DETECTED
Barbiturates: NOT DETECTED
Benzodiazepines: NOT DETECTED
Cocaine: NOT DETECTED
Opiates: NOT DETECTED
Tetrahydrocannabinol: NOT DETECTED

## 2022-02-13 LAB — RESP PANEL BY RT-PCR (FLU A&B, COVID) ARPGX2
Influenza A by PCR: NEGATIVE
Influenza B by PCR: NEGATIVE
SARS Coronavirus 2 by RT PCR: NEGATIVE

## 2022-02-13 LAB — ETHANOL: Alcohol, Ethyl (B): <10 mg/dL (ref <10)

## 2022-02-13 LAB — APTT: aPTT: 29 seconds (ref 24–36)

## 2022-02-13 LAB — TROPONIN I (HIGH SENSITIVITY): Troponin I (High Sensitivity): 5 ng/L (ref <18)

## 2022-02-13 MED ORDER — PROPRANOLOL HCL 20 MG PO TABS
10.0000 mg | ORAL_TABLET | Freq: Two times a day (BID) | ORAL | Status: DC
  Administered 2022-02-14 – 2022-02-15 (×3): 10 mg via ORAL
  Filled 2022-02-13 (×3): qty 1

## 2022-02-13 MED ORDER — OXYCODONE HCL 5 MG PO TABS: 5.0000 mg | ORAL_TABLET | ORAL | Status: DC | PRN

## 2022-02-13 MED ORDER — ACETAMINOPHEN 650 MG RE SUPP: 650.0000 mg | Freq: Four times a day (QID) | RECTAL | Status: DC | PRN

## 2022-02-13 MED ORDER — PANTOPRAZOLE SODIUM 40 MG PO TBEC
40.0000 mg | DELAYED_RELEASE_TABLET | Freq: Every day | ORAL | Status: DC
  Administered 2022-02-14 – 2022-02-15 (×2): 40 mg via ORAL
  Filled 2022-02-13 (×2): qty 1

## 2022-02-13 MED ORDER — ASPIRIN 81 MG PO CHEW
324.0000 mg | CHEWABLE_TABLET | Freq: Once | ORAL | Status: AC
  Administered 2022-02-13: 324 mg via ORAL
  Filled 2022-02-13: qty 4

## 2022-02-13 MED ORDER — HEPARIN SODIUM (PORCINE) 5000 UNIT/ML IJ SOLN
5000.0000 [IU] | Freq: Three times a day (TID) | INTRAMUSCULAR | Status: DC
Start: 2022-02-13 — End: 2022-02-15
  Administered 2022-02-13 – 2022-02-15 (×5): 5000 [IU] via SUBCUTANEOUS
  Filled 2022-02-13 (×5): qty 1

## 2022-02-13 MED ORDER — EZETIMIBE 10 MG PO TABS
10.0000 mg | ORAL_TABLET | Freq: Every day | ORAL | Status: DC
  Administered 2022-02-14 – 2022-02-15 (×2): 10 mg via ORAL
  Filled 2022-02-13 (×2): qty 1

## 2022-02-13 MED ORDER — ASPIRIN 81 MG PO TBEC
81.0000 mg | DELAYED_RELEASE_TABLET | Freq: Every day | ORAL | Status: DC
  Administered 2022-02-15: 81 mg via ORAL
  Filled 2022-02-13 (×2): qty 1

## 2022-02-13 MED ORDER — HYDROMORPHONE HCL 1 MG/ML IJ SOLN
1.0000 mg | Freq: Once | INTRAMUSCULAR | Status: AC
  Administered 2022-02-13: 1 mg via INTRAVENOUS
  Filled 2022-02-13: qty 1

## 2022-02-13 MED ORDER — ONDANSETRON HCL 4 MG/2ML IJ SOLN
4.0000 mg | Freq: Four times a day (QID) | INTRAMUSCULAR | Status: DC | PRN
  Administered 2022-02-13: 4 mg via INTRAVENOUS
  Filled 2022-02-13: qty 2

## 2022-02-13 MED ORDER — NITROGLYCERIN 0.4 MG SL SUBL
0.4000 mg | SUBLINGUAL_TABLET | SUBLINGUAL | Status: DC | PRN
  Administered 2022-02-13: 0.4 mg via SUBLINGUAL
  Filled 2022-02-13: qty 1

## 2022-02-13 MED ORDER — RISPERIDONE 0.5 MG PO TABS
0.5000 mg | ORAL_TABLET | Freq: Every day | ORAL | Status: DC
  Administered 2022-02-13 – 2022-02-14 (×2): 0.5 mg via ORAL
  Filled 2022-02-13 (×2): qty 1

## 2022-02-13 MED ORDER — ROSUVASTATIN CALCIUM 20 MG PO TABS
40.0000 mg | ORAL_TABLET | Freq: Every day | ORAL | Status: DC
  Administered 2022-02-14 – 2022-02-15 (×2): 40 mg via ORAL
  Filled 2022-02-13 (×2): qty 2

## 2022-02-13 MED ORDER — FESOTERODINE FUMARATE ER 4 MG PO TB24
4.0000 mg | ORAL_TABLET | Freq: Every day | ORAL | Status: DC
  Administered 2022-02-14 – 2022-02-15 (×2): 4 mg via ORAL
  Filled 2022-02-13 (×2): qty 1

## 2022-02-13 MED ORDER — AMITRIPTYLINE HCL 25 MG PO TABS
50.0000 mg | ORAL_TABLET | Freq: Every day | ORAL | Status: DC
Start: 2022-02-13 — End: 2022-02-15
  Administered 2022-02-13 – 2022-02-14 (×2): 50 mg via ORAL
  Filled 2022-02-13 (×2): qty 2

## 2022-02-13 MED ORDER — TOPIRAMATE 25 MG PO TABS
50.0000 mg | ORAL_TABLET | Freq: Every day | ORAL | Status: DC
  Administered 2022-02-14 – 2022-02-15 (×2): 50 mg via ORAL
  Filled 2022-02-13 (×2): qty 2

## 2022-02-13 MED ORDER — LOSARTAN POTASSIUM 50 MG PO TABS
100.0000 mg | ORAL_TABLET | Freq: Every day | ORAL | Status: DC
  Administered 2022-02-14 – 2022-02-15 (×2): 100 mg via ORAL
  Filled 2022-02-13 (×3): qty 2

## 2022-02-13 MED ORDER — TENECTEPLASE FOR STROKE
PACK | INTRAVENOUS | Status: AC
  Filled 2022-02-13: qty 10

## 2022-02-13 MED ORDER — ACETAMINOPHEN 325 MG PO TABS: 650.0000 mg | ORAL_TABLET | Freq: Four times a day (QID) | ORAL | Status: DC | PRN

## 2022-02-13 MED ORDER — LABETALOL HCL 5 MG/ML IV SOLN
INTRAVENOUS | Status: AC
  Filled 2022-02-13: qty 4

## 2022-02-13 MED ORDER — ONDANSETRON HCL 4 MG PO TABS: 4.0000 mg | ORAL_TABLET | Freq: Four times a day (QID) | ORAL | Status: DC | PRN

## 2022-02-13 NOTE — ED Triage Notes (Signed)
Pt to ed via [facility] from home. C/o of anxiety and CP that started roughtly 45 min. Ago on left side chest/shoulder.  Hx of stroke/ htn.   Per ems: 134/78 ST HR 102 99% 20 G LAC

## 2022-02-13 NOTE — ED Notes (Signed)
[place] paged @ 2055

## 2022-02-13 NOTE — ED Notes (Signed)
Lab at bedside

## 2022-02-13 NOTE — Consult Note (Signed)
[facility] Tele[facility]urology Consult Services   Patient Name:   Kendra [name], Kendra [name] Date of Birth:   [birth year] Identification Number:   MRN - [id number] Date of Service:   02/13/2022 21:03:43  Diagnosis:       R53.1 - Weakness  Impression:      76yo Spanish-speaking F with hx of TIAs/stroke-like episodes, migraines, HTN, HL, chronic pain, depression/anxiety, conversion disorder presenting with multiple complaints including chest pain, headache, cramping, weakness, and speech difficulty. On teleneuro exam, patient has slow and stuttering speech (but no obvious aphasia), with L-sided numbness and inconsistent/effort-dependent weakness in the L arm and leg -- after repeated attempts she was able to hold her L arm and leg antigravity without drift. CT head shows no acute abnormalities. Given her exam, I suspect her symptoms are more likely to be due to conversion disorder rather than stroke.  Our recommendations are outlined below.  Recommendations:        Stroke/Telemetry Floor       Neuro Checks       Bedside Swallow Eval       DVT Prophylaxis       IV Fluids, Normal Saline       Head of Bed 30 Degrees       Euglycemia and Avoid Hyperthermia (PRN Acetaminophen)       Initiate or continue Aspirin 81 MG daily       Chest pain workup per primary team       MRI brain without gad  Per facility request will defer further work up, management, and referrals to inpatient service, inclusive of inpatient neurology consult.  Sign Out:       Discussed with Emergency Department Provider    ------------------------------------------------------------------------------  Advanced Imaging: Advanced Imaging Deferred because:  Stroke not suspected with clinical presentation and exam   Metrics: Last Known Well: 02/13/2022 17:00:00 [facility] Notification Time: 02/13/2022 21:03:43 Arrival Time: 02/13/2022 20:39:00 Stamp Time: 02/13/2022 21:03:43 Initial Response Time:  02/13/2022 21:09:00 Symptoms: chest pain, headache, cramping, weakness, speech difficulty. Initial patient interaction: 02/13/2022 21:13:05 NIHSS Assessment Completed: 02/13/2022 21:27:00 Patient is not a candidate for Thrombolytic. Thrombolytic Medical Decision: 02/13/2022 21:30:00 Patient was not deemed candidate for Thrombolytic because of following reasons: Last Well Known Above 4.5 Hours. No disabling symptoms. Although I started assessing the patient while she was still within the 4.5 hour treatment window, we would not have been able to order/mix/administer the TNK in time.  CT head showed no acute hemorrhage or acute core infarct.  Primary Provider Notified of Diagnostic Impression and Management Plan on: 02/13/2022 21:36:29    ------------------------------------------------------------------------------  History of Present Illness: Patient is a 76 year old Female.  Patient was brought by private transportation with symptoms of chest pain, headache, cramping, weakness, speech difficulty. 76yo F with hx of TIAs/stroke-like episodes, migraines, HTN, HL, chronic pain, depression/anxiety, conversion disorder presenting with multiple complaints including chest pain, headache, cramping, weakness, and speech difficulty. Patient is Spanish-speaking and interviewed with assistance from a Spanish interpreter. Patient states she last felt normal around 17:00 this evening. The first symptoms she noticed tonight was L-sided chest pain. This chest pain radiated into her L arm. She felt cramps in her hands and feet bilaterally as well as cramping pain in her L arm. She is also having some difficulty with her speech; she is speaking slowly and stuttering at times. Patient had similar symptoms in 05/2021 with a negative MRI.   Past Medical History: [place] PMH:  TIAs/stroke-like episodes, migraines, HTN, HL, chronic pain,  depression/anxiety, conversion disorder  Medications:  No Anticoagulant  use  Antiplatelet use: Yes ASA Reviewed EMR for current medications  Allergies:  Reviewed  Social History: Drug Use: No  Family History:  There is no family history of premature cerebrovascular disease pertinent to this consultation  ROS : 14 Points Review of Systems was performed and was negative except mentioned in HPI.  Past Surgical History: There Is No Surgical History Contributory To Today's Visit    Examination: BP(154/89), Pulse(81), 1A: Level of Consciousness - Alert; keenly responsive + 0 1B: Ask Month and Age - Could Not Answer Either Question Correctly + 2 1C: Blink Eyes & Squeeze Hands - Performs Both Tasks + 0 2: Test Horizontal Extraocular Movements - Normal + 0 3: Test Visual Fields - No Visual Loss + 0 4: Test Facial Palsy (Use Grimace if Obtunded) - Normal symmetry + 0 5A: Test Left Arm Motor Drift - No Drift for 10 Seconds + 0 5B: Test Right Arm Motor Drift - No Drift for 10 Seconds + 0 6A: Test Left Leg Motor Drift - No Drift for 5 Seconds + 0 6B: Test Right Leg Motor Drift - No Drift for 5 Seconds + 0 7: Test Limb Ataxia (FNF/Heel-Shin) - No Ataxia + 0 8: Test Sensation - Mild-Moderate Loss: Less Sharp/More Dull + 1 9: Test Language/Aphasia - Normal; No aphasia + 0 10: Test Dysarthria - Normal + 0 11: Test Extinction/Inattention - No abnormality + 0  NIHSS Score: 3  NIHSS Free Text : Inconsistent, effort-dependent weakness in L arm and leg. Initially there is immediate drift, but with repeated attempts and coaching she is able to hold them without drift. There is an inconsistent and distractible tremor. Her speech is slow and she stutters at times but she is able to communicate with the interpreter  Pre-Morbid Modified Rankin Scale: Unable to assess   Patient/Family was informed the Neurology Consult would occur via TeleHealth consult by way of interactive audio and video telecommunications and consented to receiving care in this  manner.   Patient is being evaluated for possible acute neurologic impairment and high probability of imminent or life-threatening deterioration. I spent total of 60 minutes providing care to this patient, including time for face to face visit via telemedicine, review of medical records, imaging studies and discussion of findings with providers, the patient and/or family.   Dr Damaris Hippo   [facility] For Inpatient follow-up with [facility] physician please call [facility] [phone]. This is not an outpatient service. Post hospital discharge, please contact hospital directly.

## 2022-02-13 NOTE — ED Notes (Signed)
Code Stroke Ended at 2133

## 2022-02-13 NOTE — ED Notes (Signed)
Pt got nauseated with dry heaves. PRN zofran pulled

## 2022-02-13 NOTE — Progress Notes (Signed)
Code stroke activation time 2052 Pt left for CT 2100 Neuro [facility] paged 2103 Neuro [facility] on camera 2110 Pt returned from CT 2113  Pt LKW 1700 MRS 0   -Telest[facility] RN

## 2022-02-13 NOTE — ED Provider Notes (Addendum)
[facility] EMERGENCY DEPARTMENT Provider Note   CSN: [id number] Arrival date & time: 02/13/22  2039     History  No chief complaint on file.   Kendra [name] is a 76 y.o. female.  Patient brought in by EMS.  History of a little bit foggy but apparently was normal up until around 2000 this evening.  Patient states that her left leg feels very heavy and she seems to be having difficulty with speech.  Also may be having some chest pain but she talked about pain throughout her whole body.  Patient is Hispanic we used an interpreter.  Chart review shows that she is followed by rehab medicine for her lumbar radiculopathy and had an injection on July 12.  Patient states that she cannot speak properly.  EMS brought her in for chest pain.  They thought they could not communicate with her because she was Hispanic.       Home Medications Prior to Admission medications   Medication Sig Start Date End Date Taking? Authorizing Provider  amitriptyline (ELAVIL) 50 MG tablet Take 1 tablet (50 mg total) by mouth at bedtime. 09/05/21   Coral Spikes, DO  aspirin EC 81 MG EC tablet Take 1 tablet (81 mg total) by mouth daily. Swallow whole. 06/04/21   Johnson, Clanford L, MD  baclofen (LIORESAL) 10 MG tablet Take 1 tablet (10 mg total) by mouth 3 (three) times daily as needed for muscle spasms. 08/10/21   Coral Spikes, DO  cephALEXin (KEFLEX) 500 MG capsule Take 1 capsule (500 mg total) by mouth 2 (two) times daily. 11/08/21   Coral Spikes, DO  ezetimibe (ZETIA) 10 MG tablet Take 1 tablet (10 mg total) by mouth daily. 09/13/21 12/12/21  Arnoldo Lenis, MD  losartan (COZAAR) 100 MG tablet Take 1 tablet (100 mg total) by mouth daily. 08/24/21   Arnoldo Lenis, MD  nitroGLYCERIN (NITROSTAT) 0.4 MG SL tablet Place 1 tablet (0.4 mg total) under the tongue every 5 (five) minutes x 3 doses as needed for chest pain. 12/27/17   Rai, Vernelle Emerald, MD  oxyCODONE-acetaminophen (PERCOCET/ROXICET) 5-325 MG tablet Take  1 tablet by mouth every 8 (eight) hours as needed for severe pain. 09/08/21   Coral Spikes, DO  pantoprazole (PROTONIX) 40 MG tablet Take 1 tablet (40 mg total) by mouth daily. 08/10/21   Coral Spikes, DO  propranolol (INDERAL) 20 MG tablet Take 0.5 tablets (10 mg total) by mouth 2 (two) times daily. 08/24/21   Arnoldo Lenis, MD  risperiDONE (RISPERDAL) 0.5 MG tablet Take 1 tablet (0.5 mg total) by mouth at bedtime. 09/05/21   Coral Spikes, DO  rosuvastatin (CRESTOR) 40 MG tablet Take 1 tablet (40 mg total) by mouth daily. 08/24/21   Arnoldo Lenis, MD  sertraline (ZOLOFT) 50 MG tablet Take 3 tablets (150 mg total) by mouth at bedtime. 09/05/21 12/04/21  Coral Spikes, DO  solifenacin (VESICARE) 10 MG tablet Take 1 tablet (10 mg total) by mouth daily. 11/14/21   Coral Spikes, DO  topiramate (TOPAMAX) 50 MG tablet Take 1 tablet (50 mg total) by mouth daily. 11/08/21   Coral Spikes, DO  rivaroxaban (XARELTO) 20 MG TABS tablet Take 1 tablet (20 mg total) by mouth daily with supper. Please fill this prescription after the first starter pack has been completed. 01/16/18 01/03/19  Mendel Corning, MD      Allergies    Morphine and Pineapple  Review of Systems   Review of Systems  Constitutional:  Negative for chills and fever.  HENT:  Negative for ear pain and sore throat.   Eyes:  Negative for pain and visual disturbance.  Respiratory:  Negative for cough and shortness of breath.   Cardiovascular:  Positive for chest pain. Negative for palpitations.  Gastrointestinal:  Negative for abdominal pain and vomiting.  Genitourinary:  Negative for dysuria and hematuria.  Musculoskeletal:  Negative for arthralgias and back pain.  Skin:  Negative for color change and rash.  Neurological:  Positive for speech difficulty, weakness and headaches. Negative for seizures and syncope.  All other systems reviewed and are negative.   Physical Exam Updated Vital Signs BP (!) 171/132 (BP Location: Left Arm)    Pulse 85   Resp 13   SpO2 98%  Physical Exam Vitals and nursing note reviewed.  Constitutional:      General: She is in acute distress.     Appearance: Normal appearance. She is well-developed.  HENT:     Head: Normocephalic and atraumatic.  Eyes:     Extraocular Movements: Extraocular movements intact.     Conjunctiva/sclera: Conjunctivae normal.     Pupils: Pupils are equal, round, and reactive to light.  Cardiovascular:     Rate and Rhythm: Normal rate and regular rhythm.     Heart sounds: No murmur heard. Pulmonary:     Effort: Pulmonary effort is normal. No respiratory distress.     Breath sounds: Normal breath sounds. No wheezing or rhonchi.  Abdominal:     Palpations: Abdomen is soft.     Tenderness: There is no abdominal tenderness.  Musculoskeletal:        General: No swelling.     Cervical back: Neck supple.  Skin:    General: Skin is warm and dry.     Capillary Refill: Capillary refill takes less than 2 seconds.  Neurological:     Mental Status: She is alert.     Cranial Nerves: Cranial nerve deficit present.     Motor: Weakness present.     Comments: Patient is alert and will follow commands having trouble with speech.  Patient states left leg is very heavy difficulty lifting it.  Similar with the left arm.  Left arm may have pain associated with it.  Patient able to raise right leg and right arm without difficulty.  No facial droop.  Psychiatric:        Mood and Affect: Mood normal.     ED Results / Procedures / Treatments   Labs (all labs ordered are listed, but only abnormal results are displayed) Labs Reviewed  RESP PANEL BY RT-PCR (FLU A&B, COVID) ARPGX2  ETHANOL  PROTIME-INR  APTT  CBC  DIFFERENTIAL  COMPREHENSIVE METABOLIC PANEL  RAPID URINE DRUG SCREEN, HOSP PERFORMED  URINALYSIS, ROUTINE W REFLEX MICROSCOPIC  I-STAT CHEM 8, ED  TROPONIN I (HIGH SENSITIVITY)    EKG EKG Interpretation  Date/Time:  Tuesday February 13 2022 20:48:42  EDT Ventricular Rate:  86 PR Interval:  160 QRS Duration: 74 QT Interval:  411 QTC Calculation: 492 R Axis:   64 Text Interpretation: Sinus rhythm Nonspecific T abnormalities, lateral leads Borderline prolonged QT interval No significant change since last tracing Confirmed by Fredia Sorrow [phone]) on 02/13/2022 9:02:13 PM  Radiology No results found.  Procedures Procedures    Medications Ordered in ED Medications  labetalol (NORMODYNE) 5 MG/ML injection (has no administration in time range)  tenecteplase (TNKASE) 50 MG injection  for Stroke (has no administration in time range)    ED Course/ Medical Decision Making/ A&P                           Medical Decision Making Amount and/or Complexity of Data Reviewed Labs: ordered. Radiology: ordered.  Risk OTC drugs. Prescription drug management. Decision regarding hospitalization.   CRITICAL CARE Performed by: Fredia Sorrow Total critical care time: 45 minutes Critical care time was exclusive of separately billable procedures and treating other patients. Critical care was necessary to treat or prevent imminent or life-threatening deterioration. Critical care was time spent personally by me on the following activities: development of treatment plan with patient and/or surrogate as well as nursing, discussions with consultants, evaluation of patient's response to treatment, examination of patient, obtaining history from patient or surrogate, ordering and performing treatments and interventions, ordering and review of laboratory studies, ordering and review of radiographic studies, pulse oximetry and re-evaluation of patient's condition.  Story a little bit difficult to decipher.  Patient seems to have chest pain but also tongue pain in the head and pain throughout the body.  We will go ahead and work-up for possible cardiac event in EKG without any acute findings basically sinus rhythm nonspecific T waves borderline prolonged  QT.  But also some concerns that there may have been a cute onset of stroke symptoms at about 2000 since she is got weakness in the left leg and having speech difficulties.  We will activate code stroke.  CT head as per radiology negative.  Patient being interviewed by telemetry neurology stroke team.  Stroke team gets last known normal at 1700.  But also a lot of this seems like maybe there was some anxiety some drama and maybe a little bit of a conversion disorder.  They want to hold off on any interventions.  But are recommending admission from a neurological standpoint with MRI in the morning.  Regarding the chest pain patient's chest x-ray without any acute findings initial troponin was 5.  Will need a delta troponin.  Patient's urinalysis negative.  Labs without significant abnormalities.  CBC normal complete metabolic panel just with a glucose of 125 but renal function LFTs are normal potassium is normal.  Normal anion gap.    Will discuss with the hospitalist for admission.  Final Clinical Impression(s) / ED Diagnoses Final diagnoses:  Cerebrovascular accident (CVA), unspecified mechanism ([place])  Precordial pain    Rx / DC Orders ED Discharge Orders     None         Fredia Sorrow, MD 02/13/22 2102    Fredia Sorrow, MD 02/13/22 2116    Fredia Sorrow, MD 02/13/22 2153  Addendum: After discussion with the hospitalist patient was saying that before that that she was pain-free.  Down nurses notify me that patient is having chest pain again we will give aspirin we will give nitroglycerin we will give some Dilaudid because she is allergic to morphine.  And delta troponins pending    Fredia Sorrow, MD 02/13/22 2213

## 2022-02-13 NOTE — ED Notes (Addendum)
During triage, this RN was concerned for stroke given hx of pt and pt's s/s. The stratus was brought to the room for language interpretation. EDP made aware and came to bedside. After brief assessment, called a code stroke.

## 2022-02-13 NOTE — ED Notes (Signed)
Pt taken to xray from CT

## 2022-02-14 ENCOUNTER — Observation Stay (HOSPITAL_BASED_OUTPATIENT_CLINIC_OR_DEPARTMENT_OTHER): Payer: Medicare (Managed Care)

## 2022-02-14 ENCOUNTER — Observation Stay (HOSPITAL_COMMUNITY): Payer: Medicare (Managed Care)

## 2022-02-14 ENCOUNTER — Other Ambulatory Visit: Payer: Self-pay

## 2022-02-14 DIAGNOSIS — F449 Dissociative and conversion disorder, unspecified: Secondary | ICD-10-CM | POA: Diagnosis not present

## 2022-02-14 DIAGNOSIS — R079 Chest pain, unspecified: Secondary | ICD-10-CM | POA: Diagnosis not present

## 2022-02-14 DIAGNOSIS — R4701 Aphasia: Secondary | ICD-10-CM | POA: Diagnosis not present

## 2022-02-14 LAB — ECHOCARDIOGRAM COMPLETE
AR max vel: 2.45 cm2
AV Area VTI: 2.68 cm2
AV Area mean vel: 2.32 cm2
AV Mean grad: 3 mmHg
AV Peak grad: 6.1 mmHg
Ao pk vel: 1.23 m/s
Area-P 1/2: 5.13 cm2
Calc EF: 58.4 %
MV VTI: 4.09 cm2
S' Lateral: 2.5 cm
Single Plane A2C EF: 61.5 %
Single Plane A4C EF: 51.8 %

## 2022-02-14 LAB — CBC WITH DIFFERENTIAL/PLATELET
Abs Immature Granulocytes: 0.04 10*3/uL (ref 0.00–0.07)
Basophils Absolute: 0.1 10*3/uL (ref 0.0–0.1)
Basophils Relative: 1 %
Eosinophils Absolute: 0.2 10*3/uL (ref 0.0–0.5)
Eosinophils Relative: 2 %
HCT: 40.8 % (ref 36.0–46.0)
Hemoglobin: 13.1 g/dL (ref 12.0–15.0)
Immature Granulocytes: 0 %
Lymphocytes Relative: 12 %
Lymphs Abs: 1.1 10*3/uL (ref 0.7–4.0)
MCH: 31.8 pg (ref 26.0–34.0)
MCHC: 32.1 g/dL (ref 30.0–36.0)
MCV: 99 fL (ref 80.0–100.0)
Monocytes Absolute: 0.5 10*3/uL (ref 0.1–1.0)
Monocytes Relative: 6 %
Neutro Abs: 7.2 10*3/uL (ref 1.7–7.7)
Neutrophils Relative %: 79 %
Platelets: 225 10*3/uL (ref 150–400)
RBC: 4.12 MIL/uL (ref 3.87–5.11)
RDW: 14 % (ref 11.5–15.5)
WBC: 9.2 10*3/uL (ref 4.0–10.5)
nRBC: 0 % (ref 0.0–0.2)

## 2022-02-14 LAB — TSH: TSH: 0.995 u[IU]/mL (ref 0.350–4.500)

## 2022-02-14 LAB — COMPREHENSIVE METABOLIC PANEL
ALT: 23 U/L (ref 0–44)
AST: 29 U/L (ref 15–41)
Albumin: 3.5 g/dL (ref 3.5–5.0)
Alkaline Phosphatase: 81 U/L (ref 38–126)
Anion gap: 6 (ref 5–15)
BUN: 13 mg/dL (ref 8–23)
CO2: 25 mmol/L (ref 22–32)
Calcium: 8.7 mg/dL — ABNORMAL LOW (ref 8.9–10.3)
Chloride: 108 mmol/L (ref 98–111)
Creatinine, Ser: 0.61 mg/dL (ref 0.44–1.00)
GFR, Estimated: >60 mL/min (ref >60)
Glucose, Bld: 138 mg/dL — ABNORMAL HIGH (ref 70–99)
Potassium: 3.8 mmol/L (ref 3.5–5.1)
Sodium: 139 mmol/L (ref 135–145)
Total Bilirubin: 0.5 mg/dL (ref 0.3–1.2)
Total Protein: 6.4 g/dL — ABNORMAL LOW (ref 6.5–8.1)

## 2022-02-14 LAB — MAGNESIUM: Magnesium: 2 mg/dL (ref 1.7–2.4)

## 2022-02-14 LAB — TROPONIN I (HIGH SENSITIVITY): Troponin I (High Sensitivity): 4 ng/L (ref <18)

## 2022-02-14 MED ORDER — LORAZEPAM 1 MG PO TABS
2.0000 mg | ORAL_TABLET | Freq: Once | ORAL | Status: AC
  Administered 2022-02-14: 2 mg via ORAL
  Filled 2022-02-14: qty 2

## 2022-02-14 NOTE — Assessment & Plan Note (Signed)
-   Continue Zetia and Crestor

## 2022-02-14 NOTE — Assessment & Plan Note (Signed)
Continue Protonix °

## 2022-02-14 NOTE — Progress Notes (Signed)
  Transition of Care [facility]) Screening Note   Patient Details  Name: Kendra [name] Date of Birth: [birth year]   Transition of Care [facility]) CM/SW Contact:    Iona Beard, [place] Phone Number: 02/14/2022, 10:54 AM    Transition of Care Department [facility]) has reviewed patient and no TOC needs have been identified at this time. We will continue to monitor patient advancement through interdisciplinary progression rounds. If new patient transition needs arise, please place a TOC consult.

## 2022-02-14 NOTE — H&P (Addendum)
History and Physical    Patient: Kendra [name] DOB: 1945-10-14 DOA: 02/13/2022 DOS: the patient was seen and examined on 02/14/2022 PCP: [facility], [facility]  Patient coming from: Home  Chief Complaint: No chief complaint on file.  HPI: Kendra [name] is a 76 y.o. female with medical history significant of conversion disorder, depression, GERD, hyperlipidemia, hypertension, migraine, panic attacks, thyroid disease, and more presents ED with a chief complaint of aphasia.  Patient is Spanish-speaking and having aphasia at this time.  Most history is taken from husband and he does ask her couple of questions to fill in the gaps.  Is reported the patient's last known well was 6 PM.  Friend went to check on patient at 8 PM and found her shaking, aphasic, and indicating that she had chest pain.  She said her chest pain was sharp, and radiating down her left arm.  She has left arm weakness and left lower extremity weakness as well.  Husband reports that patient had not complained of anything like this in the days leading up.  She had been in her normal state of health.  In the ED, patient was given nitro and that helped the chest pain to go away.  Patient remains aphasic.  No further history could be obtained at this time.  Patient does not smoke, does not drink.  She is vaccinated for COVID.  Patient is full code. Review of Systems: unable to review all systems due to the inability of the patient to answer questions. Past Medical History:  Diagnosis Date   Arthritis    Chronic back pain    Conversion disorder    caused by stress of daughter's death in 10-14-09   Depression    GERD (gastroesophageal reflux disease)    Hypercholesterolemia    Hypertension    Kidney stone    stones   Migraine    Panic attacks    daily, worse the 15th of each month   Thyroid disease    Past Surgical History:  Procedure Laterality Date   ABDOMINAL HYSTERECTOMY     r ovary removal    APPENDECTOMY     CHOLECYSTECTOMY     CYSTOSCOPY     LEFT HEART CATH AND CORONARY ANGIOGRAPHY N/A 12/26/2017   Procedure: LEFT HEART CATH AND CORONARY ANGIOGRAPHY;  Surgeon: Lorretta Harp, MD;  Location: [facility];  Service: Cardiovascular;  Laterality: N/A;   THYROID SURGERY     Social History:  reports that she has never smoked. She has never used smokeless tobacco. She reports that she does not drink alcohol and does not use drugs.  Allergies  Allergen Reactions   Morphine Swelling    REACTION: swelling   Pineapple Swelling    Throat swelling    Family History  Problem Relation Age of Onset   Arthritis Mother    Migraines Mother    Cancer Sister    Cancer Maternal Grandmother     Prior to Admission medications   Medication Sig Start Date End Date Taking? Authorizing Provider  amitriptyline (ELAVIL) 50 MG tablet Take 1 tablet (50 mg total) by mouth at bedtime. 09/05/21   Coral Spikes, DO  aspirin EC 81 MG EC tablet Take 1 tablet (81 mg total) by mouth daily. Swallow whole. 06/04/21   Johnson, Clanford L, MD  baclofen (LIORESAL) 10 MG tablet Take 1 tablet (10 mg total) by mouth 3 (three) times daily as needed for muscle spasms. 08/10/21   Lacinda Axon,  Jayce G, DO  cephALEXin (KEFLEX) 500 MG capsule Take 1 capsule (500 mg total) by mouth 2 (two) times daily. 11/08/21   Coral Spikes, DO  ezetimibe (ZETIA) 10 MG tablet Take 1 tablet (10 mg total) by mouth daily. 09/13/21 12/12/21  Arnoldo Lenis, MD  losartan (COZAAR) 100 MG tablet Take 1 tablet (100 mg total) by mouth daily. 08/24/21   Arnoldo Lenis, MD  nitroGLYCERIN (NITROSTAT) 0.4 MG SL tablet Place 1 tablet (0.4 mg total) under the tongue every 5 (five) minutes x 3 doses as needed for chest pain. 12/27/17   Rai, Vernelle Emerald, MD  oxyCODONE-acetaminophen (PERCOCET/ROXICET) 5-325 MG tablet Take 1 tablet by mouth every 8 (eight) hours as needed for severe pain. 09/08/21   Coral Spikes, DO  pantoprazole (PROTONIX) 40 MG tablet  Take 1 tablet (40 mg total) by mouth daily. 08/10/21   Coral Spikes, DO  propranolol (INDERAL) 20 MG tablet Take 0.5 tablets (10 mg total) by mouth 2 (two) times daily. 08/24/21   Arnoldo Lenis, MD  risperiDONE (RISPERDAL) 0.5 MG tablet Take 1 tablet (0.5 mg total) by mouth at bedtime. 09/05/21   Coral Spikes, DO  rosuvastatin (CRESTOR) 40 MG tablet Take 1 tablet (40 mg total) by mouth daily. 08/24/21   Arnoldo Lenis, MD  sertraline (ZOLOFT) 50 MG tablet Take 3 tablets (150 mg total) by mouth at bedtime. 09/05/21 12/04/21  Coral Spikes, DO  solifenacin (VESICARE) 10 MG tablet Take 1 tablet (10 mg total) by mouth daily. 11/14/21   Coral Spikes, DO  topiramate (TOPAMAX) 50 MG tablet Take 1 tablet (50 mg total) by mouth daily. 11/08/21   Coral Spikes, DO  rivaroxaban (XARELTO) 20 MG TABS tablet Take 1 tablet (20 mg total) by mouth daily with supper. Please fill this prescription after the first starter pack has been completed. 01/16/18 01/03/19  Mendel Corning, MD    Physical Exam: Vitals:   02/14/22 0300 02/14/22 0330 02/14/22 0400 02/14/22 0415  BP: 111/72 116/72 111/64   Pulse: 73 76 72 73  Resp: '12 18 11 16  '$ Temp:      TempSrc:      SpO2: 95% 96% 97% 100%   1.  General: Patient lying supine in bed,  no acute distress   2. Psychiatric: Alert and oriented x 3, mood is anxious and behavior normal for situation, pleasant and cooperative with exam   3. Neurologic: Very slow/delayed answers to questions, husband reports her language is still slurred when she is able to get a word out, face is symmetric, subtle weakness in the left lower extremity and left upper extremity compared to the right, normal sensation   4. HEENMT:  Head is atraumatic, normocephalic, pupils reactive to light, neck is supple, trachea is midline, mucous membranes are moist   5. Respiratory : Lungs are clear to auscultation bilaterally without wheezing, rhonchi, rales, no cyanosis, no increase in work of  breathing or accessory muscle use   6. Cardiovascular : Heart rate normal, rhythm is regular, no murmurs, rubs or gallops, no peripheral edema, peripheral pulses palpated   7. Gastrointestinal:  Abdomen is soft, nondistended, nontender to palpation bowel sounds active, no masses or organomegaly palpated   8. Skin:  Skin is warm, dry and intact without rashes, acute lesions, or ulcers on limited exam   9.Musculoskeletal:  No acute deformities or trauma, no asymmetry in tone, no peripheral edema, peripheral pulses palpated, no tenderness to palpation  in the extremities  Data Reviewed: In the ED Afebrile, heart rate 81-85, respiratory rate 13-19, blood pressure 154/88-171/132, 98% on room air No leukocytosis with a white blood cell count of 10.4, hemoglobin 14.2, platelets 243 Chemistry is unremarkable Troponin 5, 4 EKG is sinus rhythm with a heart rate of 86, QTc 492 CT shows no acute intracranial abnormality   Assessment and Plan: * Aphasia - Last known well 6 PM on July 25 -Complaints of aphasia and left upper and lower extremity weakness -Neuro recommends telemetry monitoring, neurochecks, DVT prophylaxis euglycemia, aspirin 81 mg daily, MRI brain without contrast -Aspirin 324 mg given in ED -Continuing aspirin 81 mg from home -This is thought to maybe be more related to her conversion disorder than a stroke so permissive hypertension was not recommended, will continue hypertensive medications -Continue to monitor  Chronic migraine without aura - Continue Topamax  Conversion disorder - Neurology implied that they believe this to be the etiology behind patient's aphasia -We will continue to rule out stroke for completeness  Mixed hyperlipidemia - Continue Zetia and Crestor  Essential hypertension - Continue propanolol, losartan  Chest pain - down trending trop -Chest pain resolved with nitro and pain meds in the ED -consult cardiology -Echo in the AM -Continue to  monitor on tele  GERD Continue Protonix      Advance Care Planning:   Code Status: Full Code   Consults: Neurology, cardiology  Family Communication: Husband at bedside  Severity of Illness: The appropriate patient status for this patient is OBSERVATION. Observation status is judged to be reasonable and necessary in order to provide the required intensity of service to ensure the patient's safety. The patient's presenting symptoms, physical exam findings, and initial radiographic and laboratory data in the context of their medical condition is felt to place them at decreased risk for further clinical deterioration. Furthermore, it is anticipated that the patient will be medically stable for discharge from the hospital within 2 midnights of admission.   Author: Rolla Plate, DO 02/14/2022 4:33 AM  For on call review [email or web address].

## 2022-02-14 NOTE — Progress Notes (Signed)
Kendra [name] is a 76 y.o. female with medical history significant of conversion disorder, depression, GERD, hyperlipidemia, hypertension, migraine, panic attacks, thyroid disease, and more presents ED with a chief complaint of aphasia.  CT head with no acute findings and evaluation by neurology suspicious for conversion disorder.  She was also noted to have chest pain with no elevation in troponins and normal EKG.  It appears that her chest pain has now resolved and she has had prior issues with this with catheterization in 2019 with no significant findings noted.  Discussed case with cardiology with plans to follow-up outpatient as she sees Dr. Harl Bowie as her cardiologist.  Further input from neurology currently pending.  Patient seen and evaluated at bedside with husband present.  She has been admitted after midnight.  Total care time: 25 minutes

## 2022-02-14 NOTE — Progress Notes (Signed)
*  PRELIMINARY RESULTS* Echocardiogram 2D Echocardiogram has been performed.  Kendra [name] 02/14/2022, 11:57 AM

## 2022-02-14 NOTE — Assessment & Plan Note (Addendum)
-   Last known well 6 PM on July 25 -Complaints of aphasia and left upper and lower extremity weakness -Neuro recommends telemetry monitoring, neurochecks, DVT prophylaxis euglycemia, aspirin 81 mg daily, MRI brain without contrast -Aspirin 324 mg given in ED -Continuing aspirin 81 mg from home -This is thought to maybe be more related to her conversion disorder than a stroke so permissive hypertension was not recommended, will continue hypertensive medications -Continue to monitor

## 2022-02-14 NOTE — Progress Notes (Addendum)
Patient is pleasant and cooperative with husband at bedside. Husband speaks english but using the translator service when needed.

## 2022-02-14 NOTE — Assessment & Plan Note (Signed)
-   Continue propanolol, losartan

## 2022-02-14 NOTE — Assessment & Plan Note (Signed)
-   Continue Topamax

## 2022-02-14 NOTE — Assessment & Plan Note (Signed)
-   Neurology implied that they believe this to be the etiology behind patient's aphasia -We will continue to rule out stroke for completeness

## 2022-02-14 NOTE — Consult Note (Addendum)
I connected with  Kendra [name] on 02/14/22 by a video enabled telemedicine application and verified that I am speaking with the correct person using two identifiers.   I discussed the limitations of evaluation and management by telemedicine. The patient expressed understanding and agreed to proceed.  Location of patient: [facility] Location of physician: [facility]   Neurology Consultation Reason for Consult: Weakness, speech disturbance, chest pain Referring Physician: Dr. Carlynn Purl  CC: Weakness, speech disturbance, chest pain  History is obtained from:, Chart review, patient with the help of Spanish interpreter  HPI: Kendra [name] is a 76 y.o. female with past medical history of hypertension, hyperlipidemia, depression, migraine, renal calculi, panic attacks, conversion disorder who was brought in with complaints of dizziness, chest pain as well as weakness.  States around 8 PM yesterday she started feeling dizzy, weak and had chest pain.  EMS was called.  On arrival to ED, her blood pressure 134/78, heart rate 102, blood glucose was 125.  Telestroke was consulted.  Her NIHSS was 3 (could not answer orientation questions and had decreased sensation on left).  Per neurology evaluation yesterday, patient exam was inconsistent and she was noted to have effort dependent weakness in left arm and leg there was also an inconsistent and distractible tremor.  Her speech was slow and stuttering at times but she was able to communicate with the interpreter.  On my exam today, patient reports her symptoms have improved.  Denies any weakness today.  Does report a headache yesterday on the left side of her head which has improved.  States she has had similar symptoms in the past and that time was told she may have had a mini stroke.  Her sister were felt unlikely to be due to acute stroke and therefore no tPA was administered.  MRI brain has been ordered and pending.  Of note,  patient presented on 06/02/2021 with dizziness, trouble speaking, chest pain with NIHSS of 7.  Neurologist at that time noted symptoms were not in vascular distribution and exam was highly functional (giveaway weakness with drift in the extremities, stuttering, make circular movements without ataxia on finger-to-nose testing).  ROS: All other systems reviewed and negative except as noted in the HPI.   Past Medical History:  Diagnosis Date   Arthritis    Chronic back pain    Conversion disorder    caused by stress of daughter's death in Oct 04, 2009   Depression    GERD (gastroesophageal reflux disease)    Hypercholesterolemia    Hypertension    Kidney stone    stones   Migraine    Panic attacks    daily, worse the 15th of each month   Thyroid disease     Family History  Problem Relation Age of Onset   Arthritis Mother    Migraines Mother    Cancer Sister    Cancer Maternal Grandmother     Social History:  reports that she has never smoked. She has never used smokeless tobacco. She reports that she does not drink alcohol and does not use drugs.   Medications Prior to Admission  Medication Sig Dispense Refill Last Dose   amitriptyline (ELAVIL) 50 MG tablet Take 1 tablet (50 mg total) by mouth at bedtime. 90 tablet 1 Past Week   aspirin EC 81 MG EC tablet Take 1 tablet (81 mg total) by mouth daily. Swallow whole. 30 tablet 1 02/13/2022   clopidogrel (PLAVIX) 75 MG tablet Take 75  mg by mouth daily.   02/13/2022 at 0930   ezetimibe (ZETIA) 10 MG tablet Take 1 tablet (10 mg total) by mouth daily. 90 tablet 3 02/13/2022   losartan (COZAAR) 100 MG tablet Take 1 tablet (100 mg total) by mouth daily. 90 tablet 3 02/13/2022   nitroGLYCERIN (NITROSTAT) 0.4 MG SL tablet Place 1 tablet (0.4 mg total) under the tongue every 5 (five) minutes x 3 doses as needed for chest pain. 30 tablet 12 unk   pantoprazole (PROTONIX) 40 MG tablet Take 1 tablet (40 mg total) by mouth daily. 90 tablet 3 02/13/2022    propranolol (INDERAL) 20 MG tablet Take 0.5 tablets (10 mg total) by mouth 2 (two) times daily. 90 tablet 3 02/13/2022 at 0930   risperiDONE (RISPERDAL) 0.5 MG tablet Take 1 tablet (0.5 mg total) by mouth at bedtime. 90 tablet 1 Past Week   rosuvastatin (CRESTOR) 40 MG tablet Take 1 tablet (40 mg total) by mouth daily. 90 tablet 3 Past Week   sertraline (ZOLOFT) 50 MG tablet Take 3 tablets (150 mg total) by mouth at bedtime. 270 tablet 1 Past Week   topiramate (TOPAMAX) 50 MG tablet Take 1 tablet (50 mg total) by mouth daily. 90 tablet 1 02/13/2022   baclofen (LIORESAL) 10 MG tablet Take 1 tablet (10 mg total) by mouth 3 (three) times daily as needed for muscle spasms. (Patient not taking: Reported on 02/14/2022) 30 each 2 Not Taking   cephALEXin (KEFLEX) 500 MG capsule Take 1 capsule (500 mg total) by mouth 2 (two) times daily. (Patient not taking: Reported on 02/14/2022) 14 capsule 0 Not Taking   oxyCODONE-acetaminophen (PERCOCET/ROXICET) 5-325 MG tablet Take 1 tablet by mouth every 8 (eight) hours as needed for severe pain. (Patient not taking: Reported on 02/14/2022) 15 tablet 0 Not Taking   solifenacin (VESICARE) 10 MG tablet Take 1 tablet (10 mg total) by mouth daily. (Patient not taking: Reported on 02/14/2022) 30 tablet 0 Not Taking      Exam: Current vital signs: BP 107/72   Pulse 72   Temp 98 F (36.7 C) (Oral)   Resp 18   SpO2 99%  Vital signs in last 24 hours: Temp:  [97.6 F (36.4 C)-98 F (36.7 C)] 98 F (36.7 C) (07/26 1102) Pulse Rate:  [64-86] 72 (07/26 1102) Resp:  [10-20] 18 (07/26 1102) BP: (89-171)/(53-132) 107/72 (07/26 1102) SpO2:  [92 %-100 %] 99 % (07/26 0850)   Physical Exam  Constitutional: Appears well-developed and well-nourished.  Psych: Affect appropriate to situation Eyes: No scleral injection Neuro: AOx3, no aphasia, cranial nerves II through XII are grossly intact, antigravity strength in all 4 extremities without drift  I have reviewed labs in epic  and the results pertinent to this consultation are: CBC:  Recent Labs  Lab 02/13/22 2110 02/14/22 0503  WBC 10.4 9.2  NEUTROABS 7.4 7.2  HGB 14.2 13.1  HCT 43.1 40.8  MCV 97.3 99.0  PLT 243 810    Basic Metabolic Panel:  Lab Results  Component Value Date   NA 139 02/14/2022   K 3.8 02/14/2022   CO2 25 02/14/2022   GLUCOSE 138 (H) 02/14/2022   BUN 13 02/14/2022   CREATININE 0.61 02/14/2022   CALCIUM 8.7 (L) 02/14/2022   GFRNONAA >60 02/14/2022   GFRAA 93 08/29/2020   Lipid Panel:  Lab Results  Component Value Date   LDLCALC 105 (H) 08/28/2021   HgbA1c:  Lab Results  Component Value Date   HGBA1C 5.8 (H) 06/02/2021  Urine Drug Screen:     Component Value Date/Time   LABOPIA NONE DETECTED 02/13/2022 2143   COCAINSCRNUR NONE DETECTED 02/13/2022 2143   LABBENZ NONE DETECTED 02/13/2022 2143   AMPHETMU NONE DETECTED 02/13/2022 2143   THCU NONE DETECTED 02/13/2022 2143   LABBARB NONE DETECTED 02/13/2022 2143    Alcohol Level     Component Value Date/Time   ETH <10 02/13/2022 2110     I have reviewed the images obtained: CT head without contrast 02/13/2022: No acute abnormality  CTA head and neck with and without contrast 06/02/2021:  1.No intracranial large vessel occlusion or proximal high-grade arterial stenosis. 2. 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 3. Mild non-stenotic calcified plaque within the intracranial internal carotid arteries.  MRI brain without contrast 06/02/2021: No acute intracranial process.    ASSESSMENT/PLAN: 76 year old female who presented with transient weakness, speech disturbance.  Transient focal neurological deficit -Differentials include conversion disorder versus less likely stroke.  Unlikely migraine with aura.  Semiology not consistent with seizure  Recommendations: -MRI brain ordered and pending.  Will review once available -Patient is already on aspirin 81 mg daily and Plavix 75 mg daily.   She did have intracranial stenosis on CTA done on 06/02/2021.  However, its unclear why she is still on dual antiplatelets as usually the dual antiplatelets are for 3 months per recommendations.  Patient saw Dr. Merlene Laughter on 06/11/2021.  Would recommend continuing dual antiplatelets for now and following up with Dr. Merlene Laughter in 4 to 6 weeks -Continue rosuvastatin 40 mg daily -Goal blood pressure: Normotension -PT/OT -Stroke education -Modification of stroke risk factors -Management of rest of comorbidities per primary team   I have spent a total of 85  minutes with the patient reviewing hospital notes,  test results, labs and examining the patient as well as establishing an assessment and plan that was discussed personally with the patient.  > 50% of time was spent in direct patient care.   Thank you for allowing [place] to participate in the care of this patient. If you have any further questions, please contact  me or neurohospitalist.   Zeb Comfort [facility]ist

## 2022-02-14 NOTE — ED Notes (Signed)
Lab at bedside

## 2022-02-14 NOTE — Assessment & Plan Note (Signed)
-   down trending trop -Chest pain resolved with nitro and pain meds in the ED -consult cardiology -Echo in the AM -Continue to monitor on tele

## 2022-02-14 NOTE — Progress Notes (Signed)
Code stroke  Call time  9pm Beeper   855pm Exam start  001 Exam finish  642 XIP   795 LOPR   674 ADL called  912

## 2022-02-15 DIAGNOSIS — R4701 Aphasia: Secondary | ICD-10-CM | POA: Diagnosis not present

## 2022-02-15 LAB — BASIC METABOLIC PANEL
Anion gap: 6 (ref 5–15)
BUN: 21 mg/dL (ref 8–23)
CO2: 26 mmol/L (ref 22–32)
Calcium: 8.8 mg/dL — ABNORMAL LOW (ref 8.9–10.3)
Chloride: 110 mmol/L (ref 98–111)
Creatinine, Ser: 0.79 mg/dL (ref 0.44–1.00)
GFR, Estimated: >60 mL/min (ref >60)
Glucose, Bld: 126 mg/dL — ABNORMAL HIGH (ref 70–99)
Potassium: 4.1 mmol/L (ref 3.5–5.1)
Sodium: 142 mmol/L (ref 135–145)

## 2022-02-15 LAB — MAGNESIUM: Magnesium: 2.1 mg/dL (ref 1.7–2.4)

## 2022-02-15 NOTE — Evaluation (Signed)
Occupational Therapy Evaluation Patient Details Name: Kendra [name] MRN: [id number] DOB: 09/01/1945 Today's Date: 02/15/2022   History of Present Illness Kendra [name] is a 76 y.o. female with medical history significant of conversion disorder, depression, GERD, hyperlipidemia, hypertension, migraine, panic attacks, thyroid disease, and more presents ED with a chief complaint of aphasia.  Patient is Spanish-speaking and having aphasia at this time.  Most history is taken from husband and he does ask her couple of questions to fill in the gaps.  Is reported the patient's last known well was 6 PM.  Friend went to check on patient at 8 PM and found her shaking, aphasic, and indicating that she had chest pain.  She said her chest pain was sharp, and radiating down her left arm.  She has left arm weakness and left lower extremity weakness as well.  Husband reports that patient had not complained of anything like this in the days leading up.  She had been in her normal state of health.  In the ED, patient was given nitro and that helped the chest pain to go away.  Patient remains aphasic.  No further history could be obtained at this time. (per DO)   Clinical Impression   Pt agreeable to OT evaluation. Pt has L UE weakness at baseline according to husband. Husband interpreting and providing much information. Pt appeared to be conversing with husband well. Pt demonstrates fair sitting balance at EOB with need for Min A with HOB elevated to pull to sit from supine. In standing with can pt is unsteady needing Min A to transfer to chair from EOB. Min G to min A needed for standing ADL's or transfers. Pt also demonstrates difficulty with visual tracking in L field of view with possible mild L visual field deficit. Further evaluation from a vision specialist is recommended. Pt will benefit from continued OT in the hospital and recommended venue below to increase strength, balance, and endurance for safe ADL's.          Recommendations for follow up therapy are one component of a multi-disciplinary discharge planning process, led by the attending physician.  Recommendations may be updated based on patient status, additional functional criteria and insurance authorization.   Follow Up Recommendations  Home health OT    Assistance Recommended at Discharge Intermittent Supervision/Assistance  Patient can return home with the following A little help with walking and/or transfers;A little help with bathing/dressing/bathroom;Assistance with cooking/housework;Assist for transportation;Help with stairs or ramp for entrance    Functional Status Assessment  Patient has had a recent decline in their functional status and demonstrates the ability to make significant improvements in function in a reasonable and predictable amount of time.  Equipment Recommendations  None recommended by OT    Recommendations for Other Services Other (comment) (Evaluation from optometrist.)     Precautions / Restrictions Precautions Precautions: Fall Restrictions Weight Bearing Restrictions: No      Mobility Bed Mobility Overal bed mobility: Needs Assistance Bed Mobility: Supine to Sit     Supine to sit: Min assist, HOB elevated     General bed mobility comments: Single hand held assist to pull to sit with HOB elevated.    Transfers Overall transfer level: Needs assistance Equipment used: Straight cane Transfers: Sit to/from Stand, Bed to chair/wheelchair/BSC Sit to Stand: Min assist     Step pivot transfers: Min assist     General transfer comment: Pt using cane in R UE and leaning on chair with L UE  at times. Slow labored movement.      Balance Overall balance assessment: Needs assistance Sitting-balance support: No upper extremity supported, Feet supported Sitting balance-Leahy Scale: Fair Sitting balance - Comments: seated at EOB   Standing balance support: Single extremity supported, During  functional activity, Reliant on assistive device for balance Standing balance-Leahy Scale: Fair Standing balance comment: poor to fair using cane and leaning on chair at times                           ADL either performed or assessed with clinical judgement   ADL Overall ADL's : Needs assistance/impaired     Grooming: Min guard;Minimal assistance;Standing   Upper Body Bathing: Set up;Sitting   Lower Body Bathing: Min guard;Sitting/lateral leans   Upper Body Dressing : Set up;Sitting   Lower Body Dressing: Min guard;Sitting/lateral leans Lower Body Dressing Details (indicate cue type and reason): Able to doff and don R sock with extended time and mild labored movement while seated at EOB. Toilet Transfer: Min guard;Minimal assistance;Stand-pivot [facility]) Toilet Transfer Details (indicate cue type and reason): simulated via EOB to chair transfer Organ and Hygiene: Set up;Sitting/lateral lean       Functional mobility during ADLs: Min guard;Minimal assistance;Cane General ADL Comments: Unsteady in standing needing Min G to min A.     Vision Baseline Vision/History: 1 Wears glasses Ability to See in Adequate Light: 1 Impaired Patient Visual Report: No change from baseline;Eye fatigue/eye pain/headache (reports eye pain when tracking to L side.) Vision Assessment?: Yes Tracking/Visual Pursuits: Other (comment) (Blinking and reports of pain when tracking in L superior and inferior quadrants.) Visual Fields: Other (comment) (Possible mild L side visual field deficit.)                Pertinent Vitals/Pain Pain Assessment Pain Assessment: Faces Faces Pain Scale: Hurts a little bit Pain Location: L lateral neck; L knee Pain Descriptors / Indicators: Grimacing Pain Intervention(s): Limited activity within patient's tolerance, Monitored during session, Repositioned     Hand Dominance Right   Extremity/Trunk Assessment Upper Extremity  Assessment Upper Extremity Assessment: Generalized weakness;LUE deficits/detail LUE Deficits / Details: 4 to 4- grossly other than B shoulder flexion which were more 3-/5; WFL P/ROM of B shoulder flexion and abduction.  Husband reports pt's L UE is weak at baseline since stroke several months ago. LUE Sensation: decreased light touch LUE Coordination: WNL   Lower Extremity Assessment Lower Extremity Assessment: Defer to PT evaluation   Cervical / Trunk Assessment Cervical / Trunk Assessment: Normal   Communication Communication Communication: Prefers language other than English (Seeming to communicate well with husband.)   Cognition Arousal/Alertness: Awake/alert Behavior During Therapy: WFL for tasks assessed/performed Overall Cognitive Status: Within Functional Limits for tasks assessed                                                        Home Living Family/patient expects to be discharged to:: Private residence Living Arrangements: Spouse/significant other Available Help at Discharge: Family;Available 24 hours/day Type of Home: House Home Access: Stairs to enter [employer] of Steps: 2 Entrance Stairs-Rails: Right Home Layout: One level     Bathroom Shower/Tub: Teacher, early years/pre: Handicapped height (slightly raised) Bathroom Accessibility: Yes How Accessible: Accessible via walker  Home Equipment: Conservation officer, nature (2 wheels);Rollator (4 wheels);Cane - single point;Shower seat   Additional Comments: Husband reports he is present 24/7. (reported no change in home living history from recent admission)      Prior Functioning/Environment Prior Level of Function : Needs assist       Physical Assist : Mobility (physical) Mobility (physical): Transfers;Gait   Mobility Comments: PRN use of RW and cane in home; in community PRN use of cane. ADLs Comments: Independnet ADL's with assist from husband for IADL's.        OT  Problem List: Decreased strength;Decreased range of motion;Decreased activity tolerance;Impaired balance (sitting and/or standing);Impaired vision/perception;Impaired sensation      OT Treatment/Interventions: Self-care/ADL training;Therapeutic exercise;Therapeutic activities;Patient/family education;Visual/perceptual remediation/compensation    OT Goals(Current goals can be found in the care plan section) Acute Rehab OT Goals Patient Stated Goal: return home OT Goal Formulation: With patient/family Time For Goal Achievement: 03/01/22 Potential to Achieve Goals: Fair  OT Frequency: Min 1X/week                  AM-PAC OT "6 Clicks" Daily Activity     Outcome Measure Help from another person eating meals?: None Help from another person taking care of personal grooming?: A Little Help from another person toileting, which includes using toliet, bedpan, or urinal?: None Help from another person bathing (including washing, rinsing, drying)?: A Little Help from another person to put on and taking off regular upper body clothing?: None Help from another person to put on and taking off regular lower body clothing?: A Little 6 Click Score: 21   End of Session Equipment Utilized During Treatment: Other (comment) (cane)  Activity Tolerance: Patient tolerated treatment well Patient left: in chair;with call bell/phone within reach;with chair alarm set;with family/visitor present  OT Visit Diagnosis: Unsteadiness on feet (R26.81);Other abnormalities of gait and mobility (R26.89);Muscle weakness (generalized) (M62.81)                Time: 1287-8676 OT Time Calculation (min): 23 min Charges:  OT General Charges $OT Visit: 1 Visit OT Evaluation $OT Eval Low Complexity: 1 Low  Breland Trouten OT, MOT  Larey Seat 02/15/2022, 9:11 AM

## 2022-02-15 NOTE — TOC Transition Note (Signed)
Transition of Care [facility]) - CM/SW Discharge Note   Patient Details  Name: CEDAR ROSEMAN MRN: [id number] Date of Birth: April 15, 1946  Transition [facility] Care [facility] CM/SW Contact:  Iona Beard, [place] Phone Number: 02/15/2022, 10:39 AM   Clinical Narrative:    CSW updated that PT/OT recommended [facility] PT/OT at D/C. MD placed orders. CSW spoke with pt and husband in room who state they are agreeable to [facility] services. CSW spoke to [place] with [facility] who states that they can accept referral. TOC signing off.   Final next level of care: [place] Barriers to Discharge: No Barriers Identified   Patient Goals and CMS Choice Patient states their goals for this hospitalization and ongoing recovery are:: return home CMS Medicare.gov Compare Post Acute Care list provided to:: Patient Choice offered to / list presented to : Patient, Spouse  Discharge Placement                       Discharge Plan and Services                          HH Arranged: PT, OT [facility] Agency: [place] ([facility]) Date [facility] Agency Contacted: 02/15/22   Representative spoke with at [place]: [place] ([place]) Interventions     Readmission Risk Interventions     No data to display

## 2022-02-15 NOTE — Care Management Obs Status (Signed)
[place] NOTIFICATION   Patient Details  Name: Kendra [name] MRN: [id number] Date of Birth: [birth year]   Medicare Observation Status Notification Given:  Yes    Tommy Medal 02/15/2022, 9:42 AM

## 2022-02-15 NOTE — Progress Notes (Signed)
Nsg Discharge Note  Admit Date:  02/13/2022 Discharge date: 02/15/2022   Renold Genta to be D/C'd Home per MD order.  AVS completed.  Copy for chart, and copy for patient signed, and dated. Patient/caregiver able to verbalize understanding.  Discharge Medication: Allergies as of 02/15/2022       Reactions   Morphine Swelling   REACTION: swelling   Pineapple Swelling   Throat swelling        Medication List     TAKE these medications    amitriptyline 50 MG tablet Commonly known as: ELAVIL Take 1 tablet (50 mg total) by mouth at bedtime.   aspirin EC 81 MG tablet Take 1 tablet (81 mg total) by mouth daily. Swallow whole.   baclofen 10 MG tablet Commonly known as: LIORESAL Take 1 tablet (10 mg total) by mouth 3 (three) times daily as needed for muscle spasms.   cephALEXin 500 MG capsule Commonly known as: Keflex Take 1 capsule (500 mg total) by mouth 2 (two) times daily.   clopidogrel 75 MG tablet Commonly known as: PLAVIX Take 75 mg by mouth daily.   ezetimibe 10 MG tablet Commonly known as: ZETIA Take 1 tablet (10 mg total) by mouth daily.   losartan 100 MG tablet Commonly known as: COZAAR Take 1 tablet (100 mg total) by mouth daily.   nitroGLYCERIN 0.4 MG SL tablet Commonly known as: NITROSTAT Place 1 tablet (0.4 mg total) under the tongue every 5 (five) minutes x 3 doses as needed for chest pain.   oxyCODONE-acetaminophen 5-325 MG tablet Commonly known as: PERCOCET/ROXICET Take 1 tablet by mouth every 8 (eight) hours as needed for severe pain.   pantoprazole 40 MG tablet Commonly known as: PROTONIX Take 1 tablet (40 mg total) by mouth daily.   propranolol 20 MG tablet Commonly known as: INDERAL Take 0.5 tablets (10 mg total) by mouth 2 (two) times daily.   risperiDONE 0.5 MG tablet Commonly known as: RISPERDAL Take 1 tablet (0.5 mg total) by mouth at bedtime.   rosuvastatin 40 MG tablet Commonly known as: CRESTOR Take 1 tablet (40 mg total)  by mouth daily.   sertraline 50 MG tablet Commonly known as: ZOLOFT Take 3 tablets (150 mg total) by mouth at bedtime.   solifenacin 10 MG tablet Commonly known as: VESICARE Take 1 tablet (10 mg total) by mouth daily.   topiramate 50 MG tablet Commonly known as: TOPAMAX Take 1 tablet (50 mg total) by mouth daily.        Discharge Assessment: Vitals:   02/15/22 0100 02/15/22 0445  BP: 119/74 107/67  Pulse: 77 70  Resp: 16 17  Temp: 98.3 F (36.8 C) 97.9 F (36.6 C)  SpO2: 97% 97%   Skin clean, dry and intact without evidence of skin break down, no evidence of skin tears noted. IV catheter discontinued intact. Site without signs and symptoms of complications - no redness or edema noted at insertion site, patient denies c/o pain - only slight tenderness at site.  Dressing with slight pressure applied.  D/c Instructions-Education: Discharge instructions given to patient/family with verbalized understanding. D/c education completed with patient/family including follow up instructions, medication list, d/c activities limitations if indicated, with other d/c instructions as indicated by MD - patient able to verbalize understanding, all questions fully answered. Patient instructed to return to ED, call 911, or call MD for any changes in condition.  Patient escorted via [place], and D/C home via private auto.  Clovis Fredrickson, LPN 1/44/8185  11:16 AM

## 2022-02-15 NOTE — Evaluation (Signed)
Physical Therapy Evaluation Patient Details Name: Kendra [name] MRN: [id number] DOB: 26-Oct-1945 Today's Date: 02/15/2022  History of Present Illness  Kendra [name] is a 76 y.o. female with medical history significant of conversion disorder, depression, GERD, hyperlipidemia, hypertension, migraine, panic attacks, thyroid disease, and more presents ED with a chief complaint of aphasia.  Patient is Spanish-speaking and having aphasia at this time.  Most history is taken from husband and he does ask her couple of questions to fill in the gaps.  Is reported the patient's last known well was 6 PM.  Friend went to check on patient at 8 PM and found her shaking, aphasic, and indicating that she had chest pain.  She said her chest pain was sharp, and radiating down her left arm.  She has left arm weakness and left lower extremity weakness as well.  Husband reports that patient had not complained of anything like this in the days leading up.  She had been in her normal state of health.  In the ED, patient was given nitro and that helped the chest pain to go away.  Patient remains aphasic.  No further history could be obtained at this time. (per DO).   Clinical Impression  Patient sitting in chair and husband in room upon therapist arrival. Patient agreeable to participating in PT evaluation and husband provides interpretation. Patient able to perform all mobility with supervision to min guard. Patient utilized RW for transfers and ambulation. Patient was mildly unsteady upon initial standing but able to maintain balance with use of RW. Patient was able to ambulate 100 feet using a  slow, somewhat labored cadence using RW and short equal length steps. Patient limited by fatigue and complaints of left knee pain. Husband reports patient has a follow up appointment with spine doctor regarding low back injections she received.  Patient would continue to benefit from skilled physical therapy in current environment and  next venue to continue return to prior function and increase strength, endurance, balance, coordination, and functional mobility and gait skills.       Recommendations for follow up therapy are one component of a multi-disciplinary discharge planning process, led by the attending physician.  Recommendations may be updated based on patient status, additional functional criteria and insurance authorization.  Follow Up Recommendations Home health PT      Assistance Recommended at Discharge Set up Supervision/Assistance  Patient can return home with the following  A little help with walking and/or transfers;A little help with bathing/dressing/bathroom;Assistance with cooking/housework;Assist for transportation;Help with stairs or ramp for entrance    Equipment Recommendations None recommended by PT  Recommendations for Other Services       Functional Status Assessment Patient has had a recent decline in their functional status and demonstrates the ability to make significant improvements in function in a reasonable and predictable amount of time.     Precautions / Restrictions Precautions Precautions: Fall Restrictions Weight Bearing Restrictions: No      Mobility  Bed Mobility               General bed mobility comments: patient presents in chair at beginning and end of session    Transfers Overall transfer level: Needs assistance Equipment used: Rolling walker (2 wheels) Transfers: Sit to/from Stand, Bed to chair/wheelchair/BSC Sit to Stand: Min guard, Supervision   Step pivot transfers: Min guard, Supervision       General transfer comment: mildly unsteady upon initial standing but able to maintain balance with use of  RW    Ambulation/Gait Ambulation/Gait assistance: Min guard Gait Distance (Feet): 100 Feet Assistive device: Rolling walker (2 wheels) Gait Pattern/deviations: Step-through pattern, Decreased step length - right, Decreased step length - left,  Decreased stride length, Wide base of support Gait velocity: decreased     General Gait Details: slow, somewhat labored cadence using RW and short equal length steps; limited by fatigue and complaints of left knee pain; on room air throughout  Stairs            Wheelchair Mobility    Modified Rankin (Stroke Patients Only)       Balance Overall balance assessment: Needs assistance Sitting-balance support: Feet supported, Bilateral upper extremity supported Sitting balance-Leahy Scale: Good Sitting balance - Comments: seated in chair   Standing balance support: During functional activity, Reliant on assistive device for balance, Bilateral upper extremity supported Standing balance-Leahy Scale: Fair Standing balance comment: fair with RW         Pertinent Vitals/Pain Pain Assessment Pain Assessment: No/denies pain    Home Living Family/patient expects to be discharged to:: Private residence Living Arrangements: Spouse/significant other Available Help at Discharge: Family;Available 24 hours/day Type of Home: House Home Access: Ramped entrance Entrance Stairs-Rails: Right Entrance Stairs-Number of Steps: 2   Home Layout: One level Home Equipment: Rollator (4 wheels);Cane - single point;[place]ower [place] (2 wheels) Additional Comments: Husband reports he is present 24/7. (reported no change in home living history from recent admission)    Prior Function Prior Level of Function : Needs assist       Physical Assist : Mobility (physical) Mobility (physical): Transfers;Gait   Mobility Comments: PRN use of RW and cane in home; in community PRN use of cane. ADLs Comments: Independnet ADL's with assist from husband for IADL's.     Hand Dominance   Dominant Hand: Right    Extremity/Trunk Assessment   Upper Extremity Assessment Upper Extremity Assessment: Defer to OT evaluation LUE Deficits / Details: 4 to 4- grossly other than B shoulder flexion which  were more 3-/5; [facility] P/ROM of B shoulder flexion and abduction.  Husband reports pt's L UE is weak at baseline since stroke several months ago. LUE Sensation: decreased light touch LUE Coordination: WNL    Lower Extremity Assessment Lower Extremity Assessment: Generalized weakness    Cervical / Trunk Assessment Cervical / Trunk Assessment: Normal  Communication   Communication: Prefers language other than English (Seeming to communicate well with husband.)  Cognition Arousal/Alertness: Awake/alert Behavior During Therapy: WFL for tasks assessed/performed Overall Cognitive Status: Within Functional Limits for tasks assessed          General Comments      Exercises     Assessment/Plan    PT Assessment Patient needs continued PT services  PT Problem List Decreased strength;Decreased mobility;Decreased activity tolerance;Decreased balance;Pain       PT Treatment Interventions DME instruction;Therapeutic exercise;Gait training;Balance training;Neuromuscular re-education;Functional mobility training;Therapeutic activities;Patient/family education    PT Goals (Current goals can be found in the Care Plan section)  Acute Rehab PT Goals Patient Stated Goal: Go home PT Goal Formulation: With patient/family Time For Goal Achievement: 03/01/22 Potential to Achieve Goals: Good    Frequency Min 3X/week        AM-PAC PT "6 Clicks" Mobility  Outcome Measure Help needed turning from your back to your side while in a flat bed without using bedrails?: None Help needed moving from lying on your back to sitting on the side of a flat bed without using bedrails?:  A Little Help needed moving to and from a bed to a chair (including a wheelchair)?: A Little Help needed standing up from a chair using your arms (e.g., wheelchair or bedside chair)?: A Little Help needed to walk in hospital room?: A Little Help needed climbing 3-5 steps with a railing? : A Little 6 Click Score: 19    End of  Session   Activity Tolerance: Patient tolerated treatment well;Patient limited by fatigue;Patient limited by pain Patient left: in chair;with call bell/phone within reach;with family/visitor present Nurse Communication: Mobility status PT Visit Diagnosis: Unsteadiness on feet (R26.81);Other abnormalities of gait and mobility (R26.89);Muscle weakness (generalized) (M62.81)    Time: 4128-7867 PT Time Calculation (min) (ACUTE ONLY): 23 min   Charges:     PT Treatments $Therapeutic Activity: 8-22 mins        Floria Raveling. Hartnett-Rands, MS, PT Per [place] [phone]  Pamala Hurry  Hartnett-Rands 02/15/2022, 10:24 AM

## 2022-02-15 NOTE — Plan of Care (Signed)
  Problem: Acute Rehab OT Goals (only OT should resolve) Goal: Pt. Will Perform Grooming Flowsheets (Taken 02/15/2022 0914) Pt Will Perform Grooming:  with modified independence  standing Goal: Pt. Will Perform Lower Body Bathing Flowsheets (Taken 02/15/2022 0914) Pt Will Perform Lower Body Bathing:  with modified independence  sitting/lateral leans Goal: Pt. Will Perform Lower Body Dressing Flowsheets (Taken 02/15/2022 0914) Pt Will Perform Lower Body Dressing:  with modified independence  sitting/lateral leans Goal: Pt. Will Transfer To Toilet Flowsheets (Taken 02/15/2022 0914) Pt Will Transfer to Toilet:  with modified independence  ambulating Goal: Pt/Caregiver Will Perform Home Exercise Program Flowsheets (Taken 02/15/2022 0914) Pt/caregiver will Perform Home Exercise Program:  Increased ROM  Increased strength  Both right and left upper extremity  Independently  Itzell Bendavid OT, MOT

## 2022-02-15 NOTE — Plan of Care (Signed)
  Problem: Acute Rehab PT Goals(only PT should resolve) Goal: Patient Will Transfer Sit To/From Stand Outcome: Progressing Flowsheets (Taken 02/15/2022 1029) Patient will transfer sit to/from stand: with supervision Goal: Pt Will Transfer Bed To Chair/Chair To Bed Outcome: Progressing Flowsheets (Taken 02/15/2022 1029) Pt will Transfer Bed to Chair/Chair to Bed: with supervision Goal: Pt Will Perform Standing Balance Or Pre-Gait Outcome: Progressing Flowsheets (Taken 02/15/2022 1029) Pt will perform standing balance or pre-gait:  1-2 min  with unilateral UE support Goal: Pt Will Ambulate Outcome: Progressing Flowsheets (Taken 02/15/2022 1029) Pt will Ambulate:  > 125 feet  with supervision  with least restrictive assistive device   Pamala Hurry D. Hartnett-Rands, MS, PT Per [place] [phone] 02/15/2022

## 2022-02-15 NOTE — Discharge Summary (Signed)
Physician Discharge Summary  Kendra [name] CWC:[id number] DOB: 29-Aug-1945 DOA: 02/13/2022  PCP: [facility]  Admit date: 02/13/2022  Discharge date: 02/15/2022  Admitted From:Home  Disposition:  Home  Recommendations for Outpatient Follow-up:  Follow up with PCP in 1-2 weeks Follow-up with cardiology as scheduled on 8/22 regarding new changes on 2D echocardiogram and ongoing issues with chest pain Follow-up with neurology Dr. Merlene Laughter as recommended in 4 weeks and continue aspirin and Plavix as well as statin for now Continue other home medications as prior  Home Health: Yes with PT, OT  Equipment/Devices: None  Discharge Condition:Stable  CODE STATUS: Full  Diet recommendation: Heart Healthy  Brief/Interim Summary: Kendra [name] is a 76 y.o. female with medical history significant of conversion disorder, depression, GERD, hyperlipidemia, hypertension, migraine, panic attacks, thyroid disease, and more presents ED with a chief complaint of aphasia.  CT head with no acute findings and evaluation by neurology suspicious for conversion disorder.  She was also noted to have chest pain with no elevation in troponins and normal EKG.  It appears that her chest pain has now resolved and she has had prior issues with this with catheterization in 2019 with no significant findings noted.  She was noted to have some new findings of septal wall motion abnormalities on repeat echocardiogram.  Discussed case with cardiology with plans to follow-up outpatient as she sees Dr. Harl Bowie as her cardiologist, no need for any further inpatient evaluation.  Brain MRI with no acute findings and neurology consult recommending continuation of aspirin and Plavix for now as well as statin and follow-up with neurologist Dr. Merlene Laughter outpatient in the next 4-6 weeks.  She has been seen by PT/OT with need for some home health, but otherwise she is stable for discharge today.  Discharge Diagnoses:   Principal Problem:   Aphasia Active Problems:   GERD   Chest pain   Essential hypertension   Mixed hyperlipidemia   Conversion disorder   Chronic migraine without aura  Principal discharge diagnosis: Aphasia and chest pain likely in the setting of conversion disorder.  Discharge Instructions  Discharge Instructions     Diet - low sodium heart healthy   Complete by: As directed    Increase activity slowly   Complete by: As directed       Allergies as of 02/15/2022       Reactions   Morphine Swelling   REACTION: swelling   Pineapple Swelling   Throat swelling        Medication List     TAKE these medications    amitriptyline 50 MG tablet Commonly known as: ELAVIL Take 1 tablet (50 mg total) by mouth at bedtime.   aspirin EC 81 MG tablet Take 1 tablet (81 mg total) by mouth daily. Swallow whole.   baclofen 10 MG tablet Commonly known as: LIORESAL Take 1 tablet (10 mg total) by mouth 3 (three) times daily as needed for muscle spasms.   cephALEXin 500 MG capsule Commonly known as: Keflex Take 1 capsule (500 mg total) by mouth 2 (two) times daily.   clopidogrel 75 MG tablet Commonly known as: PLAVIX Take 75 mg by mouth daily.   ezetimibe 10 MG tablet Commonly known as: ZETIA Take 1 tablet (10 mg total) by mouth daily.   losartan 100 MG tablet Commonly known as: COZAAR Take 1 tablet (100 mg total) by mouth daily.   nitroGLYCERIN 0.4 MG SL tablet Commonly known as: NITROSTAT Place 1 tablet (0.4 mg total)  under the tongue every 5 (five) minutes x 3 doses as needed for chest pain.   oxyCODONE-acetaminophen 5-325 MG tablet Commonly known as: PERCOCET/ROXICET Take 1 tablet by mouth every 8 (eight) hours as needed for severe pain.   pantoprazole 40 MG tablet Commonly known as: PROTONIX Take 1 tablet (40 mg total) by mouth daily.   propranolol 20 MG tablet Commonly known as: INDERAL Take 0.5 tablets (10 mg total) by mouth 2 (two) times daily.    risperiDONE 0.5 MG tablet Commonly known as: RISPERDAL Take 1 tablet (0.5 mg total) by mouth at bedtime.   rosuvastatin 40 MG tablet Commonly known as: CRESTOR Take 1 tablet (40 mg total) by mouth daily.   sertraline 50 MG tablet Commonly known as: ZOLOFT Take 3 tablets (150 mg total) by mouth at bedtime.   solifenacin 10 MG tablet Commonly known as: VESICARE Take 1 tablet (10 mg total) by mouth daily.   topiramate 50 MG tablet Commonly known as: TOPAMAX Take 1 tablet (50 mg total) by mouth daily.        Follow-up Information     Imogene Burn, PA-C Follow up on 03/13/2022.   Specialty: Cardiology [facility]: [facility] Follow-up on 03/13/2022 at 1:00 PM with Ermalinda Barrios, PA-C (works with Dr. Harl Bowie) Contact information: [place] [address] [phone]         Phillips Odor, MD. Schedule an appointment as soon as possible for a visit in 4 week(s).   Specialty: Neurology Contact information: [address] [place] [address] [phone]                Allergies  Allergen Reactions   Morphine Swelling    REACTION: swelling   Pineapple Swelling    Throat swelling    Consultations: Discussed case with cardiology Neurology   Procedures/Studies: MR BRAIN WO CONTRAST  Result Date: 02/14/2022 CLINICAL DATA:  Stroke suspected EXAM: MRI HEAD WITHOUT CONTRAST TECHNIQUE: Multiplanar, multiecho pulse sequences of the brain and surrounding structures were obtained without intravenous contrast. COMPARISON:  06/02/2021 FINDINGS: Brain: No restricted diffusion to suggest acute or subacute infarct. No acute hemorrhage, mass, mass effect, or midline shift. No hemosiderin deposition to suggest remote hemorrhage. Remote lacunar infarct in the left external capsule. Scattered T2 hyperintense signal in the periventricular white matter, likely the sequela of mild chronic small vessel ischemic disease. No hydrocephalus or extra-axial collection. Cerebral  volume is within normal limits for age. Vascular: Normal flow voids. Skull and upper cervical spine: Normal marrow signal. Sinuses/Orbits: Mild mucosal thickening in the maxillary sinuses, ethmoid air cells, and left sphenoid sinus. The orbits are unremarkable. Other: Trace fluid in bilateral mastoid air cells. IMPRESSION: No acute intracranial process. No evidence of acute or subacute infarct. Electronically Signed   By: Merilyn Baba M.D.   On: 02/14/2022 16:40   ECHOCARDIOGRAM COMPLETE  Result Date: 02/14/2022    ECHOCARDIOGRAM REPORT   Patient Name:   Kendra [name] Date of Exam: 02/14/2022 Medical Rec #:  [id number]       Height:       63.0 in Accession #:    [id number]      Weight:       204.2 lb Date of Birth:  [birth year]       BSA:          1.951 m Patient Age:    7 years        BP:           125/77  mmHg Patient Gender: F               HR:           73 bpm. Exam Location:  [facility] Procedure: 2D Echo, Cardiac Doppler and Color Doppler Indications:    Chest Pain  History:        Patient has prior history of Echocardiogram examinations, most                 recent 06/03/2021. TIA, Signs/Symptoms:Chest Pain; Risk                 Factors:Hypertension and Dyslipidemia.  Sonographer:    Wenda Low Referring Phys: [id number] ASIA B [place]  1. Abnormal septal motion inferior basal hypokinesis . Left ventricular ejection fraction, by estimation, is 50 to 55%. The left ventricle has low normal function. The left ventricle has no regional wall motion abnormalities. The left ventricular internal cavity size was mildly dilated. Left ventricular diastolic parameters are consistent with Grade I diastolic dysfunction (impaired relaxation).  2. Right ventricular systolic function is normal. The right ventricular size is normal. There is normal pulmonary artery systolic pressure.  3. The mitral valve is normal in structure. No evidence of mitral valve regurgitation. No evidence of mitral stenosis.   4. The aortic valve is tricuspid. There is mild calcification of the aortic valve. There is mild thickening of the aortic valve. Aortic valve regurgitation is not visualized. Aortic valve sclerosis is present, with no evidence of aortic valve stenosis.  5. The inferior vena cava is normal in size with greater than 50% respiratory variability, suggesting right atrial pressure of 3 mmHg. FINDINGS  Left Ventricle: Abnormal septal motion inferior basal hypokinesis. Left ventricular ejection fraction, by estimation, is 50 to 55%. The left ventricle has low normal function. The left ventricle has no regional wall motion abnormalities. The left ventricular internal cavity size was mildly dilated. There is no left ventricular hypertrophy. Left ventricular diastolic parameters are consistent with Grade I diastolic dysfunction (impaired relaxation). Right Ventricle: The right ventricular size is normal. No increase in right ventricular wall thickness. Right ventricular systolic function is normal. There is normal pulmonary artery systolic pressure. The tricuspid regurgitant velocity is 2.33 m/s, and  with an assumed right atrial pressure of 3 mmHg, the estimated right ventricular systolic pressure is 88.4 mmHg. Left Atrium: Left atrial size was normal in size. Right Atrium: Right atrial size was normal in size. Pericardium: There is no evidence of pericardial effusion. Mitral Valve: The mitral valve is normal in structure. No evidence of mitral valve regurgitation. No evidence of mitral valve stenosis. MV peak gradient, 3.6 mmHg. The mean mitral valve gradient is 1.0 mmHg. Tricuspid Valve: The tricuspid valve is normal in structure. Tricuspid valve regurgitation is mild . No evidence of tricuspid stenosis. Aortic Valve: The aortic valve is tricuspid. There is mild calcification of the aortic valve. There is mild thickening of the aortic valve. Aortic valve regurgitation is not visualized. Aortic valve sclerosis is present,  with no evidence of aortic valve stenosis. Aortic valve mean gradient measures 3.0 mmHg. Aortic valve peak gradient measures 6.1 mmHg. Aortic valve area, by VTI measures 2.68 cm. Pulmonic Valve: The pulmonic valve was normal in structure. Pulmonic valve regurgitation is not visualized. No evidence of pulmonic stenosis. Aorta: The aortic root is normal in size and structure. Venous: The inferior vena cava is normal in size with greater than 50% respiratory variability, suggesting right atrial pressure of 3 mmHg.  IAS/Shunts: No atrial level shunt detected by color flow Doppler.  LEFT VENTRICLE PLAX 2D LVIDd:         4.60 cm     Diastology LVIDs:         2.50 cm     LV e' medial:    6.31 cm/s LV PW:         1.10 cm     LV E/e' medial:  10.6 LV IVS:        1.10 cm     LV e' lateral:   10.40 cm/s LVOT diam:     2.00 cm     LV E/e' lateral: 6.4 LV SV:         81 LV SV Index:   42 LVOT Area:     3.14 cm  LV Volumes (MOD) LV vol d, MOD A2C: 80.3 ml LV vol d, MOD A4C: 65.3 ml LV vol s, MOD A2C: 30.9 ml LV vol s, MOD A4C: 31.5 ml LV SV MOD A2C:     49.4 ml LV SV MOD A4C:     65.3 ml LV SV MOD BP:      44.8 ml RIGHT VENTRICLE RV Basal diam:  3.05 cm RV Mid diam:    2.70 cm RV S prime:     10.40 cm/s TAPSE (M-mode): 2.2 cm LEFT ATRIUM             Index        RIGHT ATRIUM           Index LA diam:        3.40 cm 1.74 cm/m   RA Area:     13.00 cm LA Vol (A2C):   55.6 ml 28.50 ml/m  RA Volume:   31.70 ml  16.25 ml/m LA Vol (A4C):   42.3 ml 21.68 ml/m LA Biplane Vol: 49.5 ml 25.37 ml/m  AORTIC VALVE                    PULMONIC VALVE AV Area (Vmax):    2.45 cm     PV Vmax:       0.75 m/s AV Area (Vmean):   2.32 cm     PV Peak grad:  2.2 mmHg AV Area (VTI):     2.68 cm AV Vmax:           123.00 cm/s AV Vmean:          86.000 cm/s AV VTI:            0.304 m AV Peak Grad:      6.1 mmHg AV Mean Grad:      3.0 mmHg LVOT Vmax:         96.00 cm/s LVOT Vmean:        63.500 cm/s LVOT VTI:          0.259 m LVOT/AV VTI ratio: 0.85   AORTA Ao Root diam: 3.50 cm MITRAL VALVE               TRICUSPID VALVE MV Area (PHT): 5.13 cm    TR Peak grad:   21.7 mmHg MV Area VTI:   4.09 cm    TR Vmax:        233.00 cm/s MV Peak grad:  3.6 mmHg MV Mean grad:  1.0 mmHg    SHUNTS MV Vmax:       0.95 m/s    Systemic VTI:  0.26 m MV Vmean:  51.2 cm/s   Systemic Diam: 2.00 cm MV Decel Time: 148 msec MV E velocity: 66.80 cm/s MV A velocity: 89.10 cm/s MV E/A ratio:  0.75 Jenkins Rouge MD Electronically signed by Jenkins Rouge MD Signature Date/Time: 02/14/2022/12:08:53 PM    Final    DG Chest Port 1 View  Result Date: 02/13/2022 CLINICAL DATA:  Chest pain and anxiety. EXAM: PORTABLE CHEST 1 VIEW COMPARISON:  Nov 22, 2019 FINDINGS: The heart size and mediastinal contours are within normal limits. There is moderate severity calcification of the aortic arch. Both lungs are clear. Multilevel degenerative changes seen throughout the thoracic spine. IMPRESSION: No active cardiopulmonary disease. Electronically Signed   By: Virgina Norfolk M.D.   On: 02/13/2022 21:23   CT HEAD CODE STROKE WO CONTRAST  Result Date: 02/13/2022 CLINICAL DATA:  Code stroke. EXAM: CT HEAD WITHOUT CONTRAST TECHNIQUE: Contiguous axial images were obtained from the base of the skull through the vertex without intravenous contrast. RADIATION DOSE REDUCTION: This exam was performed according to the departmental dose-optimization program which includes automated exposure control, adjustment of the mA and/or kV according to patient size and/or use of iterative reconstruction technique. COMPARISON:  Prior study from 06/02/2021. FINDINGS: Brain: Cerebral volume within normal limits. Mild chronic small vessel ischemic disease. No acute intracranial hemorrhage. No acute large vessel territory infarct. No mass lesion or midline shift. No hydrocephalus or extra-axial fluid collection. Vascular: No hyperdense vessel. Skull: Scalp soft tissues and calvarium within normal limits. Sinuses/Orbits:  Globes and orbital soft tissues demonstrate no acute finding. Scattered mucosal thickening noted about the ethmoidal air cells and maxillary sinuses. No mastoid effusion. Other: None. ASPECTS [facility] Stroke Program Early CT Score) - Ganglionic level infarction (caudate, lentiform nuclei, internal capsule, insula, M1-M3 cortex): 7 - Supraganglionic infarction (M4-M6 cortex): 3 Total score (0-10 with 10 being normal): 10 IMPRESSION: 1. No acute intracranial abnormality. 2. ASPECTS is 10. 3. Mild chronic microvascular ischemic disease for age. Critical Value/emergent results were called by telephone at the time of interpretation on 02/13/2022 at 9:14 pm to provider Fredia Sorrow , who verbally acknowledged these results. Electronically Signed   By: Jeannine Boga M.D.   On: 02/13/2022 21:15   MM 3D SCREEN BREAST BILATERAL  Result Date: 02/06/2022 CLINICAL DATA:  Screening. EXAM: DIGITAL SCREENING BILATERAL MAMMOGRAM WITH TOMOSYNTHESIS AND CAD TECHNIQUE: Bilateral screening digital craniocaudal and mediolateral oblique mammograms were obtained. Bilateral screening digital breast tomosynthesis was performed. The images were evaluated with computer-aided detection. COMPARISON:  Previous exam(s). ACR Breast Density Category b: There are scattered areas of fibroglandular density. FINDINGS: There are no findings suspicious for malignancy. IMPRESSION: No mammographic evidence of malignancy. A result letter of this screening mammogram will be mailed directly to the patient. RECOMMENDATION: Screening mammogram in one year. (Code:[id number]-B-01Y) BI-RADS CATEGORY  1: Negative. Electronically Signed   By: Audie Pinto M.D.   On: 02/06/2022 15:25     Discharge Exam: Vitals:   02/15/22 0100 02/15/22 0445  BP: 119/74 107/67  Pulse: 77 70  Resp: 16 17  Temp: 98.3 F (36.8 C) 97.9 F (36.6 C)  SpO2: 97% 97%   Vitals:   02/14/22 1902 02/14/22 2100 02/15/22 0100 02/15/22 0445  BP: 101/77 117/73 119/74 107/67   Pulse: 81 70 77 70  Resp:  '16 16 17  '$ Temp:  (!) 97.5 F (36.4 C) 98.3 F (36.8 C) 97.9 F (36.6 C)  TempSrc:      SpO2: 97% 97% 97% 97%    General: Pt is alert, awake,  not in acute distress Cardiovascular: RRR, S1/S2 +, no rubs, no gallops Respiratory: CTA bilaterally, no wheezing, no rhonchi Abdominal: Soft, NT, ND, bowel sounds + Extremities: no edema, no cyanosis    The results of significant diagnostics from this hospitalization (including imaging, microbiology, ancillary and laboratory) are listed below for reference.     Microbiology: Recent Results (from the past 240 hour(s))  Resp Panel by RT-PCR (Flu A&B, Covid) Urine, Clean Catch     Status: None   Collection Time: 02/13/22  9:43 PM   Specimen: Urine, Clean Catch; Nasal Swab  Result Value Ref Range Status   SARS Coronavirus 2 by RT PCR NEGATIVE NEGATIVE Final    Comment: (NOTE) SARS-CoV-2 target nucleic acids are NOT DETECTED.  The SARS-CoV-2 RNA is generally detectable in upper respiratory specimens during the acute phase of infection. The lowest concentration of SARS-CoV-2 viral copies this assay can detect is 138 copies/mL. A negative result does not preclude SARS-Cov-2 infection and should not be used as the sole basis for treatment or other patient management decisions. A negative result may occur with  improper specimen collection/handling, submission of specimen other than nasopharyngeal swab, presence of viral mutation(s) within the areas targeted by this assay, and inadequate number of viral copies(<138 copies/mL). A negative result must be combined with clinical observations, patient history, and epidemiological information. The expected result is Negative.  Fact Sheet for Patients:  EntrepreneurPulse.com.au  Fact Sheet for Healthcare Providers:  IncredibleEmployment.be  This test is no t yet approved or cleared by the [place] FDA and  has been  authorized for detection and/or diagnosis of SARS-CoV-2 by FDA under an Emergency Use Authorization (EUA). This EUA will remain  in effect (meaning this test can be used) for the duration of the COVID-19 declaration under Section 564(b)(1) of the Act, 21 U.S.C.section 360bbb-3(b)(1), unless the authorization is terminated  or revoked sooner.       Influenza A by PCR NEGATIVE NEGATIVE Final   Influenza B by PCR NEGATIVE NEGATIVE Final    Comment: (NOTE) The Xpert Xpress SARS-CoV-2/FLU/RSV plus assay is intended as an aid in the diagnosis of influenza from Nasopharyngeal swab specimens and should not be used as a sole basis for treatment. Nasal washings and aspirates are unacceptable for Xpert Xpress SARS-CoV-2/FLU/RSV testing.  Fact Sheet for Patients: EntrepreneurPulse.com.au  Fact Sheet for Healthcare Providers: IncredibleEmployment.be  This test is not yet approved or cleared by the [place] FDA and has been authorized for detection and/or diagnosis of SARS-CoV-2 by FDA under an Emergency Use Authorization (EUA). This EUA will remain in effect (meaning this test can be used) for the duration of the COVID-19 declaration under Section 564(b)(1) of the Act, 21 U.S.C. section 360bbb-3(b)(1), unless the authorization is terminated or revoked.  Performed at [facility], [address] [place], [place] [address]      Labs: BNP (last 3 results) No results for input(s): "BNP" in the last 8760 hours. Basic Metabolic Panel: Recent Labs  Lab 02/13/22 2110 02/14/22 0503 02/15/22 0507  NA 138 139 142  K 3.7 3.8 4.1  CL 107 108 110  CO2 '25 25 26  '$ GLUCOSE 125* 138* 126*  BUN '16 13 21  '$ CREATININE 0.65 0.61 0.79  CALCIUM 9.0 8.7* 8.8*  MG  --  2.0 2.1   Liver Function Tests: Recent Labs  Lab 02/13/22 2110 02/14/22 0503  AST 22 29  ALT 19 23  ALKPHOS 86 81  BILITOT 0.7 0.5  PROT 6.8 6.4*  ALBUMIN 3.9 3.5   No results for  input(s): "LIPASE", "AMYLASE" in the last 168 hours. No results for input(s): "AMMONIA" in the last 168 hours. CBC: Recent Labs  Lab 02/13/22 2110 02/14/22 0503  WBC 10.4 9.2  NEUTROABS 7.4 7.2  HGB 14.2 13.1  HCT 43.1 40.8  MCV 97.3 99.0  PLT 243 225   Cardiac Enzymes: No results for input(s): "CKTOTAL", "CKMB", "CKMBINDEX", "TROPONINI" in the last 168 hours. BNP: Invalid input(s): "POCBNP" CBG: No results for input(s): "GLUCAP" in the last 168 hours. D-Dimer No results for input(s): "DDIMER" in the last 72 hours. Hgb A1c No results for input(s): "HGBA1C" in the last 72 hours. Lipid Profile No results for input(s): "CHOL", "HDL", "LDLCALC", "TRIG", "CHOLHDL", "LDLDIRECT" in the last 72 hours. Thyroid function studies Recent Labs    02/14/22 0503  TSH 0.995   Anemia work up No results for input(s): "VITAMINB12", "FOLATE", "FERRITIN", "TIBC", "IRON", "RETICCTPCT" in the last 72 hours. Urinalysis    Component Value Date/Time   COLORURINE STRAW (A) 02/13/2022 2143   APPEARANCEUR CLEAR 02/13/2022 2143   APPEARANCEUR Clear 04/26/2021 1015   LABSPEC 1.004 (L) 02/13/2022 2143   PHURINE 8.0 02/13/2022 2143   GLUCOSEU NEGATIVE 02/13/2022 2143   HGBUR SMALL (A) 02/13/2022 2143   HGBUR trace-intact 04/27/2009 1109   BILIRUBINUR NEGATIVE 02/13/2022 2143   BILIRUBINUR + 11/08/2021 1402   BILIRUBINUR Present 04/26/2021 1015   KETONESUR NEGATIVE 02/13/2022 2143   PROTEINUR NEGATIVE 02/13/2022 2143   UROBILINOGEN 1.0 11/08/2021 1402   UROBILINOGEN 0.2 03/15/2015 2324   NITRITE NEGATIVE 02/13/2022 2143   LEUKOCYTESUR TRACE (A) 02/13/2022 2143   Sepsis Labs Recent Labs  Lab 02/13/22 2110 02/14/22 0503  WBC 10.4 9.2   Microbiology Recent Results (from the past 240 hour(s))  Resp Panel by RT-PCR (Flu A&B, Covid) Urine, Clean Catch     Status: None   Collection Time: 02/13/22  9:43 PM   Specimen: Urine, Clean Catch; Nasal Swab  Result Value Ref Range Status   SARS  Coronavirus 2 by RT PCR NEGATIVE NEGATIVE Final    Comment: (NOTE) SARS-CoV-2 target nucleic acids are NOT DETECTED.  The SARS-CoV-2 RNA is generally detectable in upper respiratory specimens during the acute phase of infection. The lowest concentration of SARS-CoV-2 viral copies this assay can detect is 138 copies/mL. A negative result does not preclude SARS-Cov-2 infection and should not be used as the sole basis for treatment or other patient management decisions. A negative result may occur with  improper specimen collection/handling, submission of specimen other than nasopharyngeal swab, presence of viral mutation(s) within the areas targeted by this assay, and inadequate number of viral copies(<138 copies/mL). A negative result must be combined with clinical observations, patient history, and epidemiological information. The expected result is Negative.  Fact Sheet for Patients:  EntrepreneurPulse.com.au  Fact Sheet for Healthcare Providers:  IncredibleEmployment.be  This test is no t yet approved or cleared by the [place] FDA and  has been authorized for detection and/or diagnosis of SARS-CoV-2 by FDA under an Emergency Use Authorization (EUA). This EUA will remain  in effect (meaning this test can be used) for the duration of the COVID-19 declaration under Section 564(b)(1) of the Act, 21 U.S.C.section 360bbb-3(b)(1), unless the authorization is terminated  or revoked sooner.       Influenza A by PCR NEGATIVE NEGATIVE Final   Influenza B by PCR NEGATIVE NEGATIVE Final    Comment: (NOTE) The Xpert Xpress SARS-CoV-2/FLU/RSV plus assay is intended as an  aid in the diagnosis of influenza from Nasopharyngeal swab specimens and should not be used as a sole basis for treatment. Nasal washings and aspirates are unacceptable for Xpert Xpress SARS-CoV-2/FLU/RSV testing.  Fact Sheet for  Patients: EntrepreneurPulse.com.au  Fact Sheet for Healthcare Providers: IncredibleEmployment.be  This test is not yet approved or cleared by the [place] FDA and has been authorized for detection and/or diagnosis of SARS-CoV-2 by FDA under an Emergency Use Authorization (EUA). This EUA will remain in effect (meaning this test can be used) for the duration of the COVID-19 declaration under Section 564(b)(1) of the Act, 21 U.S.C. section 360bbb-3(b)(1), unless the authorization is terminated or revoked.  Performed at [facility], [address] [place], [place] [address]      Time coordinating discharge: 35 minutes  SIGNED:   Rodena Goldmann, DO [facility] 02/15/2022, 9:56 AM  If 7PM-7AM, please contact night-coverage [email or web address]

## 2022-03-06 NOTE — Progress Notes (Signed)
Cardiology Office Note    Date:  03/13/2022   ID:  Kendra [name], DOB Nov 21, 1945, MRN [id number]   PCP:  [facility]  Cardiologist:  Carlyle Dolly, MD   Advanced Practice Provider:  No care team member to display Electrophysiologist:  None   [id number]   Chief Complaint  Patient presents with   Hospitalization Follow-up          History of Present Illness:  Kendra [name] is a 76 y.o. female with medical history significant of conversion disorder, depression, GERD, hyperlipidemia, hypertension, migraine, panic attacks, thyroid disease, hx chest pain, 30% LAD cath 2017-10-19,  NST 10/2020 no ischemia, echo 05/2021 LVEF 55-60% no WMA,palpitations 4 beat run NSVT, TIA 05/2021.  Patient last saw Dr. Harl Bowie 08/2021 and had run out of propranolol for palpitations  Discharged 02/15/22 with aphasia. CT head with no acute findings and evaluation by neurology suspicious for conversion disorder.Brain MRI with no acute findings and neurology consult recommending continuation of aspirin and Plavix for now as well as statin and follow-up with neurologist Dr. Merlene Laughter outpatient in the next 4-6 weeks.  Also had chest pain with new septal WMA  with inf basal HK on echo to f/u as outpatient.  Patient comes in with her husband and interpreter. Complains of chest pain-squeezing hard, deep when she is sitting and resting. Lasts a few seconds to a minute. Takes a NTG, lays down and rubs her chest and it goes away.  Occurs 2-3 times a week. Was doing PT but is finished. Gets short of breath with exertion.   Past Medical History:  Diagnosis Date   Arthritis    Chronic back pain    Conversion disorder    caused by stress of daughter's death in October 19, 2009   Depression    GERD (gastroesophageal reflux disease)    Hypercholesterolemia    Hypertension    Kidney stone    stones   Migraine    Panic attacks    daily, worse the 15th of each month   Thyroid  disease     Past Surgical History:  Procedure Laterality Date   ABDOMINAL HYSTERECTOMY     r ovary removal   APPENDECTOMY     CHOLECYSTECTOMY     CYSTOSCOPY     LEFT HEART CATH AND CORONARY ANGIOGRAPHY N/A 12/26/2017   Procedure: LEFT HEART CATH AND CORONARY ANGIOGRAPHY;  Surgeon: Lorretta Harp, MD;  Location: [facility];  Service: Cardiovascular;  Laterality: N/A;   THYROID SURGERY      Current Medications: Current Meds  Medication Sig   amitriptyline (ELAVIL) 50 MG tablet Take 1 tablet (50 mg total) by mouth at bedtime.   aspirin EC 81 MG EC tablet Take 1 tablet (81 mg total) by mouth daily. Swallow whole.   clopidogrel (PLAVIX) 75 MG tablet Take 75 mg by mouth daily.   losartan (COZAAR) 100 MG tablet Take 1 tablet (100 mg total) by mouth daily.   nitroGLYCERIN (NITROSTAT) 0.4 MG SL tablet Place 1 tablet (0.4 mg total) under the tongue every 5 (five) minutes x 3 doses as needed for chest pain.   pantoprazole (PROTONIX) 40 MG tablet Take 1 tablet (40 mg total) by mouth daily.   propranolol (INDERAL) 20 MG tablet Take 0.5 tablets (10 mg total) by mouth 2 (two) times daily.   risperiDONE (RISPERDAL) 0.5 MG tablet Take 1 tablet (0.5 mg total) by mouth at bedtime.   rosuvastatin (  CRESTOR) 40 MG tablet Take 1 tablet (40 mg total) by mouth daily.   solifenacin (VESICARE) 10 MG tablet Take 1 tablet (10 mg total) by mouth daily.   topiramate (TOPAMAX) 50 MG tablet Take 1 tablet (50 mg total) by mouth daily.     Allergies:   Morphine and Pineapple   Social History   Socioeconomic History   Marital status: Married    Spouse name: Not on file   Number of children: Not on file   Years of education: Not on file   Highest education level: Not on file  Occupational History   Not on file  Tobacco Use   Smoking status: Never   Smokeless tobacco: Never  Vaping Use   Vaping Use: Never used  Substance and Sexual Activity   Alcohol use: No   Drug use: No   Sexual activity:  Never    Birth control/protection: None  Other Topics Concern   Not on file  Social History Narrative   Not on file   Social Determinants of Health   Financial Resource Strain: Not on file  Food Insecurity: Not on file  Transportation Needs: Not on file  Physical Activity: Not on file  Stress: Not on file  Social Connections: Not on file     Family History:  The patient's  family history includes Arthritis in her mother; Cancer in her maternal grandmother and sister; Migraines in her mother.   ROS:   Please see the history of present illness.    ROS All other systems reviewed and are negative.   PHYSICAL EXAM:   VS:  BP 118/76   Pulse 84   Ht '5\' 3"'$  (1.6 m)   Wt 207 lb (93.9 kg)   SpO2 95%   BMI 36.67 kg/m   Physical Exam  GEN: Obese, in no acute distress  Neck: no JVD, carotid bruits, or masses Cardiac:RRR; no murmurs, rubs, or gallops  Respiratory:  clear to auscultation bilaterally, normal work of breathing GI: soft, nontender, nondistended, + BS Ext: without cyanosis, clubbing, or edema, Good distal pulses bilaterally Neuro:  Alert and Oriented x 3  Psych: euthymic mood, full affect  Wt Readings from Last 3 Encounters:  03/13/22 207 lb (93.9 kg)  11/08/21 204 lb 3.2 oz (92.6 kg)  09/08/21 202 lb 9.6 oz (91.9 kg)      Studies/Labs Reviewed:   EKG:  EKG is not ordered today.     Recent Labs: 02/14/2022: ALT 23; Hemoglobin 13.1; Platelets 225; TSH 0.995 02/15/2022: BUN 21; Creatinine, Ser 0.79; Magnesium 2.1; Potassium 4.1; Sodium 142   Lipid Panel    Component Value Date/Time   CHOL 186 08/28/2021 1231   CHOL 332 (H) 08/29/2020 1553   TRIG 121 08/28/2021 1231   HDL 57 08/28/2021 1231   HDL 53 08/29/2020 1553   CHOLHDL 3.3 08/28/2021 1231   VLDL 24 08/28/2021 1231   LDLCALC 105 (H) 08/28/2021 1231   LDLCALC 227 (H) 08/29/2020 1553    Additional studies/ records that were reviewed today include:    Echo 02/14/22 IMPRESSIONS     1. Abnormal  septal motion inferior basal hypokinesis . Left ventricular  ejection fraction, by estimation, is 50 to 55%. The left ventricle has low  normal function. The left ventricle has no regional wall motion  abnormalities. The left ventricular  internal cavity size was mildly dilated. Left ventricular diastolic  parameters are consistent with Grade I diastolic dysfunction (impaired  relaxation).   2. Right ventricular systolic  function is normal. The right ventricular  size is normal. There is normal pulmonary artery systolic pressure.   3. The mitral valve is normal in structure. No evidence of mitral valve  regurgitation. No evidence of mitral stenosis.   4. The aortic valve is tricuspid. There is mild calcification of the  aortic valve. There is mild thickening of the aortic valve. Aortic valve  regurgitation is not visualized. Aortic valve sclerosis is present, with  no evidence of aortic valve stenosis.   5. The inferior vena cava is normal in size with greater than 50%  respiratory variability, suggesting right atrial pressure of 3 mmHg.   Risk Assessment/Calculations:         ASSESSMENT:    1. Precordial pain   2. Palpitations   3. Conversion disorder   4. Essential hypertension   5. Mixed hyperlipidemia   6. Chest pain, unspecified type      PLAN:  In order of problems listed above:   Hx chest pain, 30% LAD cath 2019,  NST 10/2020 no ischemia, echo 05/2021 LVEF 55-60% no WMA, echo 02/14/22 new septal WMA but normal LVEF. Continues to have chest pain at rest a couple days/week relieved with NTG and rubbing chest. Will check lexiscan myoview. Consider low dose Imdur in future but hold off today with current BP  Palpitations with 4 beat NSVT on monitor  History of conversion disorder with recent symptoms of aphasia felt to be due to this  HTN well controlled  HLD on rosuvastatin LDL 105 08/28/21    Shared Decision Making/Informed Consent   Shared Decision Making/Informed  Consent The risks [chest pain, shortness of breath, cardiac arrhythmias, dizziness, blood pressure fluctuations, myocardial infarction, stroke/transient ischemic attack, nausea, vomiting, allergic reaction, radiation exposure, metallic taste sensation and life-threatening complications (estimated to be 1 in 10,000)], benefits (risk stratification, diagnosing coronary artery disease, treatment guidance) and alternatives of a nuclear stress test were discussed in detail with Ms. Ayars and she agrees to proceed.    Medication Adjustments/Labs and Tests Ordered: Current medicines are reviewed at length with the patient today.  Concerns regarding medicines are outlined above.  Medication changes, Labs and Tests ordered today are listed in the Patient Instructions below. Patient Instructions  Medication Instructions:  Your physician recommends that you continue on your current medications as directed. Please refer to the Current Medication list given to you today.   Labwork: None  Testing/Procedures: Your physician has requested that you have a lexiscan myoview. For further information please visit [email or web address]. Please follow instruction sheet, as given.   Follow-Up: Follow up with Dr. Harl Bowie- Next Available  Any Other Special Instructions Will Be Listed Below (If Applicable).     If you need a refill on your cardiac medications before your next appointment, please call your pharmacy.    Sumner Boast, PA-C  03/13/2022 1:29 PM    [facility] [address], [place], [place]  [address] Phone: [phone]; Fax: [phone]

## 2022-03-13 ENCOUNTER — Ambulatory Visit (INDEPENDENT_AMBULATORY_CARE_PROVIDER_SITE_OTHER): Payer: Medicare (Managed Care) | Admitting: Physician Assistant

## 2022-03-13 ENCOUNTER — Encounter: Payer: Self-pay | Admitting: Physician Assistant

## 2022-03-13 VITALS — BP 118/76 | HR 84 | Ht 63.0 in | Wt 207.0 lb

## 2022-03-13 DIAGNOSIS — R079 Chest pain, unspecified: Secondary | ICD-10-CM

## 2022-03-13 DIAGNOSIS — F449 Dissociative and conversion disorder, unspecified: Secondary | ICD-10-CM | POA: Diagnosis not present

## 2022-03-13 DIAGNOSIS — R002 Palpitations: Secondary | ICD-10-CM | POA: Diagnosis not present

## 2022-03-13 DIAGNOSIS — R072 Precordial pain: Secondary | ICD-10-CM

## 2022-03-13 DIAGNOSIS — I10 Essential (primary) hypertension: Secondary | ICD-10-CM | POA: Diagnosis not present

## 2022-03-13 DIAGNOSIS — E782 Mixed hyperlipidemia: Secondary | ICD-10-CM

## 2022-03-13 NOTE — Patient Instructions (Signed)
Medication Instructions:  Your physician recommends that you continue on your current medications as directed. Please refer to the Current Medication list given to you today.   Labwork: None  Testing/Procedures: Your physician has requested that you have a lexiscan myoview. For further information please visit [email or web address]. Please follow instruction sheet, as given.   Follow-Up: Follow up with Dr. Harl Bowie- Next Available  Any Other Special Instructions Will Be Listed Below (If Applicable).     If you need a refill on your cardiac medications before your next appointment, please call your pharmacy.

## 2022-03-19 ENCOUNTER — Encounter (HOSPITAL_BASED_OUTPATIENT_CLINIC_OR_DEPARTMENT_OTHER)
Admission: RE | Admit: 2022-03-19 | Discharge: 2022-03-19 | Disposition: A | Discharge status: Home / Self Care | Payer: Medicare (Managed Care) | Source: Ambulatory Visit | Attending: Physician Assistant | Admitting: Physician Assistant

## 2022-03-19 ENCOUNTER — Ambulatory Visit (HOSPITAL_COMMUNITY)
Admission: RE | Admit: 2022-03-19 | Discharge: 2022-03-19 | Disposition: A | Discharge status: Home / Self Care | Payer: Medicare (Managed Care) | Source: Ambulatory Visit | Attending: Physician Assistant | Admitting: Physician Assistant

## 2022-03-19 DIAGNOSIS — R079 Chest pain, unspecified: Secondary | ICD-10-CM | POA: Insufficient documentation

## 2022-03-19 DIAGNOSIS — R072 Precordial pain: Secondary | ICD-10-CM

## 2022-03-19 LAB — NM MYOCAR MULTI W/SPECT W/WALL MOTION / EF
Base ST Depression (mm): 0 mm
LV dias vol: 78 mL (ref 46–106)
LV sys vol: 42 mL
Nuc Stress EF: 46 %
Peak HR: 87 {beats}/min
RATE: 0.6
Rest HR: 69 {beats}/min
Rest Nuclear Isotope Dose: 10.9 mCi
SDS: 2
SRS: 1
SSS: 3
ST Depression (mm): 0 mm
Stress Nuclear Isotope Dose: 31 mCi
TID: 1.38

## 2022-03-19 MED ORDER — TECHNETIUM TC 99M TETROFOSMIN IV KIT
30.0000 | PACK | Freq: Once | INTRAVENOUS | Status: AC | PRN
  Administered 2022-03-19: 31 via INTRAVENOUS

## 2022-03-19 MED ORDER — REGADENOSON 0.4 MG/5ML IV SOLN
INTRAVENOUS | Status: AC
  Administered 2022-03-19: 0.4 mg via INTRAVENOUS
  Filled 2022-03-19: qty 5

## 2022-03-19 MED ORDER — TECHNETIUM TC 99M TETROFOSMIN IV KIT
10.0000 | PACK | Freq: Once | INTRAVENOUS | Status: AC | PRN
Start: 2022-03-19 — End: 2022-03-19
  Administered 2022-03-19: 10.9 via INTRAVENOUS

## 2022-03-19 MED ORDER — SODIUM CHLORIDE FLUSH 0.9 % IV SOLN
INTRAVENOUS | Status: AC
  Administered 2022-03-19: 10 mL via INTRAVENOUS
  Filled 2022-03-19: qty 10

## 2022-03-20 ENCOUNTER — Telehealth: Payer: Self-pay

## 2022-03-20 NOTE — Telephone Encounter (Signed)
-----   Message from April Garrison, [place] sent at 03/20/2022  8:45 AM EDT -----  ----- Message ----- From: Imogene Burn, PA-C Sent: 03/20/2022   8:08 AM EDT To: April Garrison, CMA  Stress test no ischemia. Low EF but echo in July normal EF. Chest pain likely not cardiac. No changes thanks

## 2022-03-20 NOTE — Telephone Encounter (Signed)
Pt notified and verbalized understanding. Patient had no questions or concerns at this time.

## 2022-06-01 ENCOUNTER — Emergency Department (HOSPITAL_COMMUNITY): Payer: Medicare HMO

## 2022-06-01 ENCOUNTER — Encounter (HOSPITAL_COMMUNITY): Payer: Self-pay

## 2022-06-01 ENCOUNTER — Other Ambulatory Visit: Payer: Self-pay

## 2022-06-01 ENCOUNTER — Emergency Department (HOSPITAL_COMMUNITY)
Admission: EM | Admit: 2022-06-01 | Discharge: 2022-06-01 | Disposition: A | Discharge status: Home / Self Care | Payer: Medicare HMO | Attending: Emergency Medicine | Admitting: Emergency Medicine

## 2022-06-01 DIAGNOSIS — R0789 Other chest pain: Secondary | ICD-10-CM

## 2022-06-01 DIAGNOSIS — R079 Chest pain, unspecified: Secondary | ICD-10-CM | POA: Diagnosis not present

## 2022-06-01 DIAGNOSIS — M533 Sacrococcygeal disorders, not elsewhere classified: Secondary | ICD-10-CM | POA: Diagnosis not present

## 2022-06-01 DIAGNOSIS — Z7902 Long term (current) use of antithrombotics/antiplatelets: Secondary | ICD-10-CM | POA: Insufficient documentation

## 2022-06-01 DIAGNOSIS — Z7982 Long term (current) use of aspirin: Secondary | ICD-10-CM | POA: Diagnosis not present

## 2022-06-01 DIAGNOSIS — Z7901 Long term (current) use of anticoagulants: Secondary | ICD-10-CM | POA: Diagnosis not present

## 2022-06-01 DIAGNOSIS — R0602 Shortness of breath: Secondary | ICD-10-CM | POA: Diagnosis not present

## 2022-06-01 LAB — HEPATIC FUNCTION PANEL
ALT: 37 U/L (ref 0–44)
AST: 36 U/L (ref 15–41)
Albumin: 3.9 g/dL (ref 3.5–5.0)
Alkaline Phosphatase: 99 U/L (ref 38–126)
Bilirubin, Direct: 0.1 mg/dL (ref 0.0–0.2)
Indirect Bilirubin: 0.6 mg/dL (ref 0.3–0.9)
Total Bilirubin: 0.7 mg/dL (ref 0.3–1.2)
Total Protein: 6.5 g/dL (ref 6.5–8.1)

## 2022-06-01 LAB — CBC
HCT: 42.5 % (ref 36.0–46.0)
Hemoglobin: 13.8 g/dL (ref 12.0–15.0)
MCH: 31.7 pg (ref 26.0–34.0)
MCHC: 32.5 g/dL (ref 30.0–36.0)
MCV: 97.7 fL (ref 80.0–100.0)
Platelets: 226 10*3/uL (ref 150–400)
RBC: 4.35 MIL/uL (ref 3.87–5.11)
RDW: 13.9 % (ref 11.5–15.5)
WBC: 6.5 10*3/uL (ref 4.0–10.5)
nRBC: 0 % (ref 0.0–0.2)

## 2022-06-01 LAB — BASIC METABOLIC PANEL
Anion gap: 6 (ref 5–15)
BUN: 18 mg/dL (ref 8–23)
CO2: 28 mmol/L (ref 22–32)
Calcium: 9.2 mg/dL (ref 8.9–10.3)
Chloride: 106 mmol/L (ref 98–111)
Creatinine, Ser: 0.67 mg/dL (ref 0.44–1.00)
GFR, Estimated: >60 mL/min (ref >60)
Glucose, Bld: 101 mg/dL — ABNORMAL HIGH (ref 70–99)
Potassium: 4.1 mmol/L (ref 3.5–5.1)
Sodium: 140 mmol/L (ref 135–145)

## 2022-06-01 LAB — TROPONIN I (HIGH SENSITIVITY)
Troponin I (High Sensitivity): 3 ng/L (ref <18)
Troponin I (High Sensitivity): 3 ng/L (ref <18)

## 2022-06-01 MED ORDER — OXYCODONE-ACETAMINOPHEN 5-325 MG PO TABS
1.0000 | ORAL_TABLET | Freq: Once | ORAL | Status: AC
  Administered 2022-06-01: 1 via ORAL
  Filled 2022-06-01: qty 1

## 2022-06-01 MED ORDER — SODIUM CHLORIDE 0.9 % IV BOLUS: 1000.0000 mL | Freq: Once | INTRAVENOUS | Status: DC

## 2022-06-01 MED ORDER — OXYCODONE-ACETAMINOPHEN 5-325 MG PO TABS: 1.0000 | ORAL_TABLET | Freq: Four times a day (QID) | ORAL | 0 refills | Status: DC | PRN

## 2022-06-01 MED ORDER — HYDROMORPHONE HCL 1 MG/ML IJ SOLN: 0.5000 mg | Freq: Once | INTRAMUSCULAR | Status: DC

## 2022-06-01 NOTE — ED Triage Notes (Signed)
Pt brought to ER by [facility] from the ICU.  She was visiting her husband in ICU and started having chest pain.  Pt describes as stabbing chest pain and became SOB.

## 2022-06-01 NOTE — ED Notes (Signed)
Pt speaks very little english.  Interpreter used for triage.

## 2022-06-01 NOTE — Discharge Instructions (Signed)
Follow-up with your family doctor this week.

## 2022-06-02 NOTE — ED Provider Notes (Signed)
[facility] EMERGENCY DEPARTMENT Provider Note   CSN: [id number] Arrival date & time: 06/01/22  1452     History  Chief Complaint  Patient presents with   Chest Pain    Kendra [name] is a 76 y.o. female.  Patient with history anxiety.  Patient was up in the ICU with her husband and started to have some chest discomfort no shortness of breath no sweating  The history is provided by the patient and medical records. No language interpreter was used.  Chest Pain Pain location:  L chest Pain quality: aching   Pain radiates to:  Does not radiate Pain severity:  Moderate Onset quality:  Sudden Timing:  Intermittent Progression:  Waxing and waning Chronicity:  Recurrent Context: not breathing   Relieved by:  Nothing Worsened by:  Nothing Associated symptoms: no abdominal pain, no back pain, no cough, no fever, no palpitations, no shortness of breath and no vomiting        Home Medications Prior to Admission medications   Medication Sig Start Date End Date Taking? Authorizing Provider  amitriptyline (ELAVIL) 50 MG tablet Take 1 tablet (50 mg total) by mouth at bedtime. 09/05/21  Yes Coral Spikes, DO  aspirin EC 81 MG EC tablet Take 1 tablet (81 mg total) by mouth daily. Swallow whole. 06/04/21  Yes Johnson, Clanford L, MD  clopidogrel (PLAVIX) 75 MG tablet Take 75 mg by mouth daily. 01/21/22  Yes [provider]  cyanocobalamin (VITAMIN B12) 1000 MCG tablet Take 1,000 mcg by mouth daily.   Yes [provider]  ezetimibe (ZETIA) 10 MG tablet Take 1 tablet (10 mg total) by mouth daily. 09/13/21 06/01/22 Yes Branch, Alphonse Guild, MD  nitroGLYCERIN (NITROSTAT) 0.4 MG SL tablet Place 1 tablet (0.4 mg total) under the tongue every 5 (five) minutes x 3 doses as needed for chest pain. 12/27/17  Yes Rai, Ripudeep K, MD  oxyCODONE-acetaminophen (PERCOCET/ROXICET) 5-325 MG tablet Take 1 tablet by mouth every 6 (six) hours as needed for severe pain. 06/01/22  Yes Milton Ferguson, MD  pantoprazole (PROTONIX) 40 MG tablet Take 1 tablet (40 mg total) by mouth daily. 08/10/21  Yes Cook, Jayce G, DO  propranolol (INDERAL) 20 MG tablet Take 0.5 tablets (10 mg total) by mouth 2 (two) times daily. Patient taking differently: Take 20 mg by mouth every evening. 08/24/21  Yes BranchAlphonse Guild, MD  risperiDONE (RISPERDAL) 0.5 MG tablet Take 1 tablet (0.5 mg total) by mouth at bedtime. 09/05/21  Yes Cook, Jayce G, DO  rosuvastatin (CRESTOR) 40 MG tablet Take 1 tablet (40 mg total) by mouth daily. 08/24/21  Yes Arnoldo Lenis, MD  sertraline (ZOLOFT) 50 MG tablet Take 3 tablets (150 mg total) by mouth at bedtime. Patient taking differently: Take 100 mg by mouth at bedtime. 09/05/21 06/01/22 Yes Cook, Jayce G, DO  solifenacin (VESICARE) 10 MG tablet Take 1 tablet (10 mg total) by mouth daily. 11/14/21  Yes Cook, Jayce G, DO  topiramate (TOPAMAX) 50 MG tablet Take 1 tablet (50 mg total) by mouth daily. Patient taking differently: Take 25 mg by mouth daily. 11/08/21  Yes Cook, Jayce G, DO  rivaroxaban (XARELTO) 20 MG TABS tablet Take 1 tablet (20 mg total) by mouth daily with supper. Please fill this prescription after the first starter pack has been completed. 01/16/18 01/03/19  Rai, Vernelle Emerald, MD      Allergies    Charentais melon (french melon), Morphine, and Pineapple    Review of  Systems   Review of Systems  Constitutional:  Negative for chills and fever.  HENT:  Negative for ear pain and sore throat.   Eyes:  Negative for pain and visual disturbance.  Respiratory:  Negative for cough and shortness of breath.   Cardiovascular:  Positive for chest pain. Negative for palpitations.  Gastrointestinal:  Negative for abdominal pain and vomiting.  Genitourinary:  Negative for dysuria and hematuria.  Musculoskeletal:  Negative for arthralgias and back pain.  Skin:  Negative for color change and rash.  Neurological:  Negative for seizures and syncope.  All other systems reviewed  and are negative.   Physical Exam Updated Vital Signs BP (!) 162/96 (BP Location: Right Arm)   Pulse 73   Temp 98 F (36.7 C) (Oral)   Resp 20   Ht '5\' 3"'$  (1.6 m)   Wt 92.5 kg   SpO2 97%   BMI 36.14 kg/m  Physical Exam Vitals and nursing note reviewed.  Constitutional:      Appearance: She is well-developed.  HENT:     Head: Normocephalic.     Nose: Nose normal.  Eyes:     General: No scleral icterus.    Conjunctiva/sclera: Conjunctivae normal.  Neck:     Thyroid: No thyromegaly.  Cardiovascular:     Rate and Rhythm: Normal rate and regular rhythm.     Heart sounds: No murmur heard.    No friction rub. No gallop.  Pulmonary:     Breath sounds: No stridor. No wheezing or rales.  Chest:     Chest wall: No tenderness.  Abdominal:     General: There is no distension.     Tenderness: There is no abdominal tenderness. There is no rebound.  Musculoskeletal:        General: Normal range of motion.     Cervical back: Neck supple.  Lymphadenopathy:     Cervical: No cervical adenopathy.  Skin:    Findings: No erythema or rash.  Neurological:     Mental Status: She is oriented to person, place, and time.     Motor: No abnormal muscle tone.     Coordination: Coordination normal.  Psychiatric:        Behavior: Behavior normal.     ED Results / Procedures / Treatments   Labs (all labs ordered are listed, but only abnormal results are displayed) Labs Reviewed  BASIC METABOLIC PANEL - Abnormal; Notable for the following components:      Result Value   Glucose, Bld 101 (*)    All other components within normal limits  CBC  HEPATIC FUNCTION PANEL  TROPONIN I (HIGH SENSITIVITY)  TROPONIN I (HIGH SENSITIVITY)    EKG EKG Interpretation  Date/Time:  Friday June 01 2022 15:07:15 EST Ventricular Rate:  77 PR Interval:  152 QRS Duration: 78 QT Interval:  386 QTC Calculation: 436 R Axis:   17 Text Interpretation: Normal sinus rhythm Cannot rule out Anterior  infarct , age undetermined Confirmed by Dory Horn) on 06/02/2022 9:12:22 AM  Radiology DG Sacrum/Coccyx  Result Date: 06/01/2022 CLINICAL DATA:  Sacral pain radiating down both legs. EXAM: SACRUM AND COCCYX - 2+ VIEW COMPARISON:  Lumbar spine radiographs dated 11/30/2021 FINDINGS: Truncated distal coccyx suggesting previous resection. This is corticated. No sacrococcygeal fracture or subluxation seen. Extensive lumbar spine degenerative changes are again demonstrated. These include facet degenerative changes with stable grade 1 retrolisthesis at the L2-3 level and stable marked disc space narrowing at that level. Atheromatous arterial calcifications.  IMPRESSION: 1. No fracture or subluxation. 2. Extensive lumbar spine degenerative changes. Electronically Signed   By: Claudie Revering M.D.   On: 06/01/2022 15:45   DG Chest 1 View  Result Date: 06/01/2022 CLINICAL DATA:  Chest pain and shortness of breath. EXAM: CHEST  1 VIEW COMPARISON:  02/13/2022 FINDINGS: Normal sized heart. Tortuous and partially calcified thoracic aorta. Clear lungs with normal vascularity. Thoracic spine degenerative changes and mild scoliosis. Cholecystectomy clips. IMPRESSION: No acute abnormality. Electronically Signed   By: Claudie Revering M.D.   On: 06/01/2022 15:43    Procedures Procedures    Medications Ordered in ED Medications  oxyCODONE-acetaminophen (PERCOCET/ROXICET) 5-325 MG per tablet 1 tablet (1 tablet Oral Given 06/01/22 1833)    ED Course/ Medical Decision Making/ A&P                           Medical Decision Making Amount and/or Complexity of Data Reviewed Labs: ordered. Radiology: ordered.  Risk Prescription drug management.  This patient presents to the ED for concern of chest pain, this involves an extensive number of treatment options, and is a complaint that carries with it a high risk of complications and morbidity.  The differential diagnosis includes coronary artery disease,  stress   Co morbidities that complicate the patient evaluation  Anxiety   Additional history obtained:  Additional history obtained from friend External records from outside source obtained and reviewed including hospital records   Lab Tests:  I Ordered, and personally interpreted labs.  The pertinent results include: Troponin x2 negative, chemistries unremarkable   Imaging Studies ordered:  I ordered imaging studies including x-ray I independently visualized and interpreted imaging which showed no acute disease I agree with the radiologist interpretation   Cardiac Monitoring: / EKG:  The patient was maintained on a cardiac monitor.  I personally viewed and interpreted the cardiac monitored which showed an underlying rhythm of: Normal sinus rhythm   Consultations Obtained:  No consultant  Problem List / ED Course / Critical interventions / Medication management  Anxiety and chest pain I ordered medication including Percocet for pain Reevaluation of the patient after these medicines showed that the patient improved I have reviewed the patients home medicines and have made adjustments as needed   Social Determinants of Health:  Does not speak English   Test / Admission - Considered:  None  Patient with anxiety, stress and atypical chest pain with 2 normal troponins.  She will follow-up with her PCP.  Patient no longer having discomfort        Final Clinical Impression(s) / ED Diagnoses Final diagnoses:  Atypical chest pain    Rx / DC Orders ED Discharge Orders          Ordered    oxyCODONE-acetaminophen (PERCOCET/ROXICET) 5-325 MG tablet  Every 6 hours PRN        06/01/22 1959              Milton Ferguson, MD 06/02/22 1035

## 2022-07-14 IMAGING — MG MM DIGITAL SCREENING BILAT W/ TOMO AND CAD
6 of 9 series · 6 of 25 positions shown · non-contrast
Comparison: Previous exam(s).

CLINICAL DATA: Screening.

EXAM:
DIGITAL SCREENING BILATERAL MAMMOGRAM WITH TOMOSYNTHESIS AND CAD
TECHNIQUE: Bilateral screening digital craniocaudal and mediolateral oblique
mammograms were obtained. Bilateral screening digital breast
tomosynthesis was performed. The images were evaluated with
computer-aided detection.

[R MLO synth-2D]
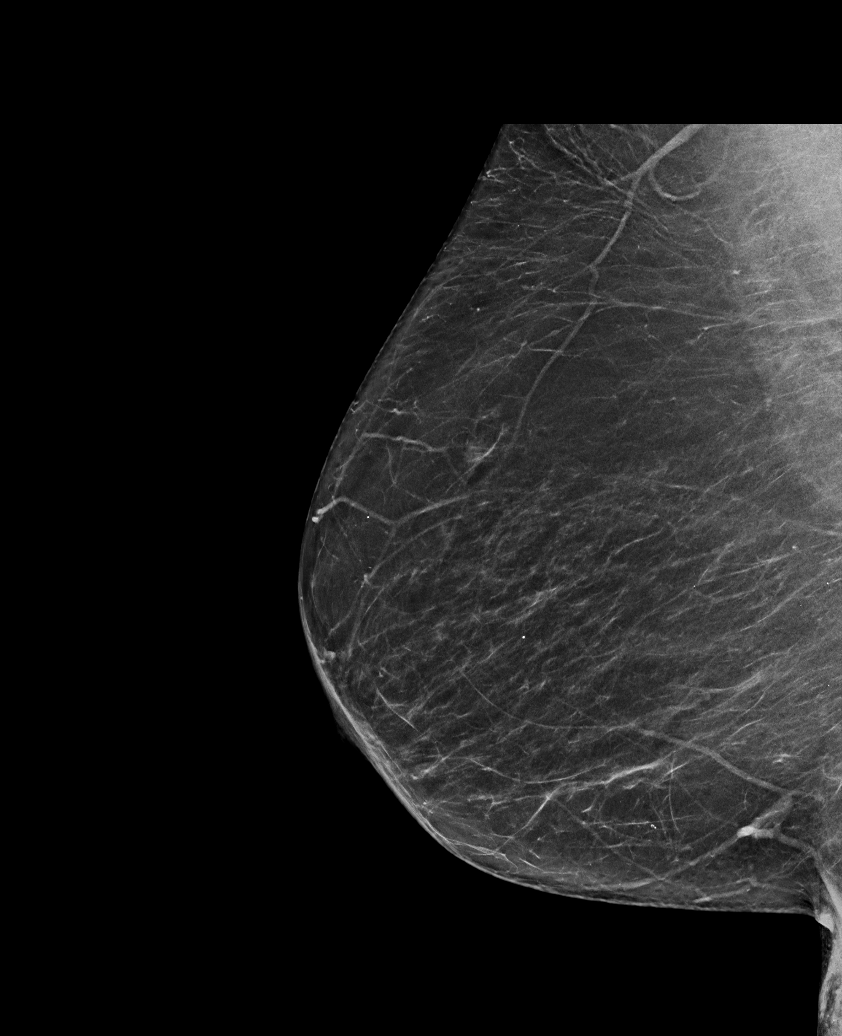

[R CV synth-2D]
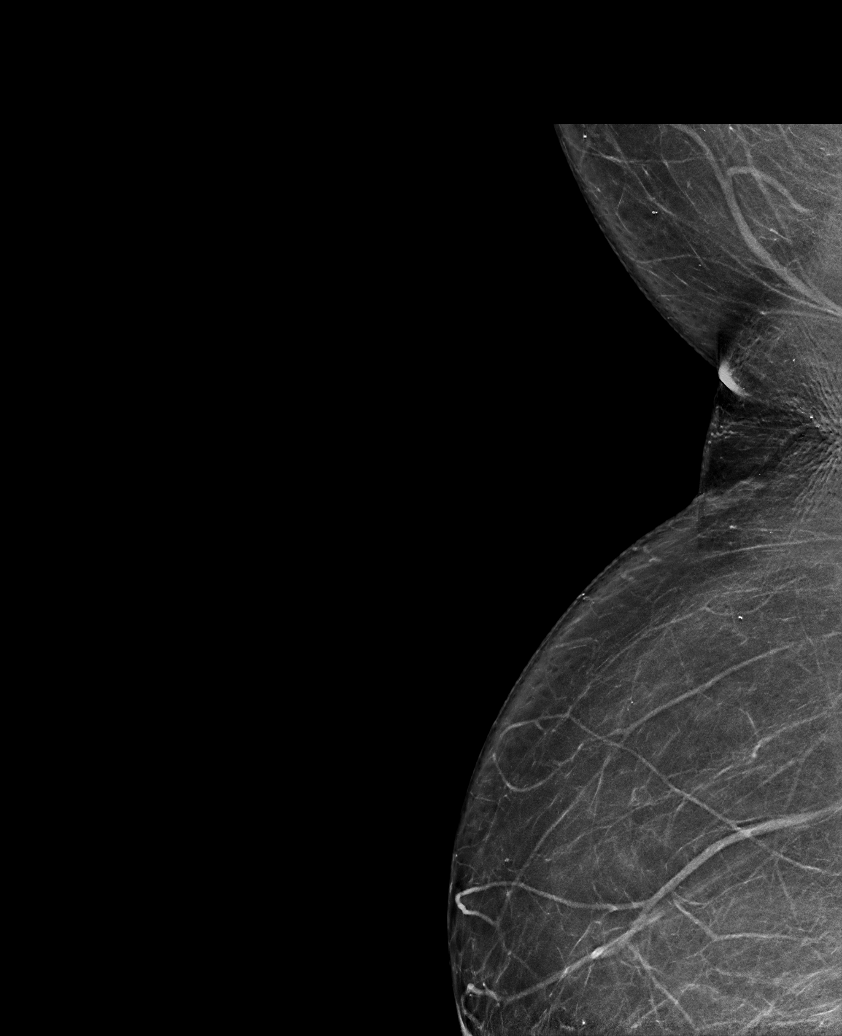

[L MLO synth-2D]
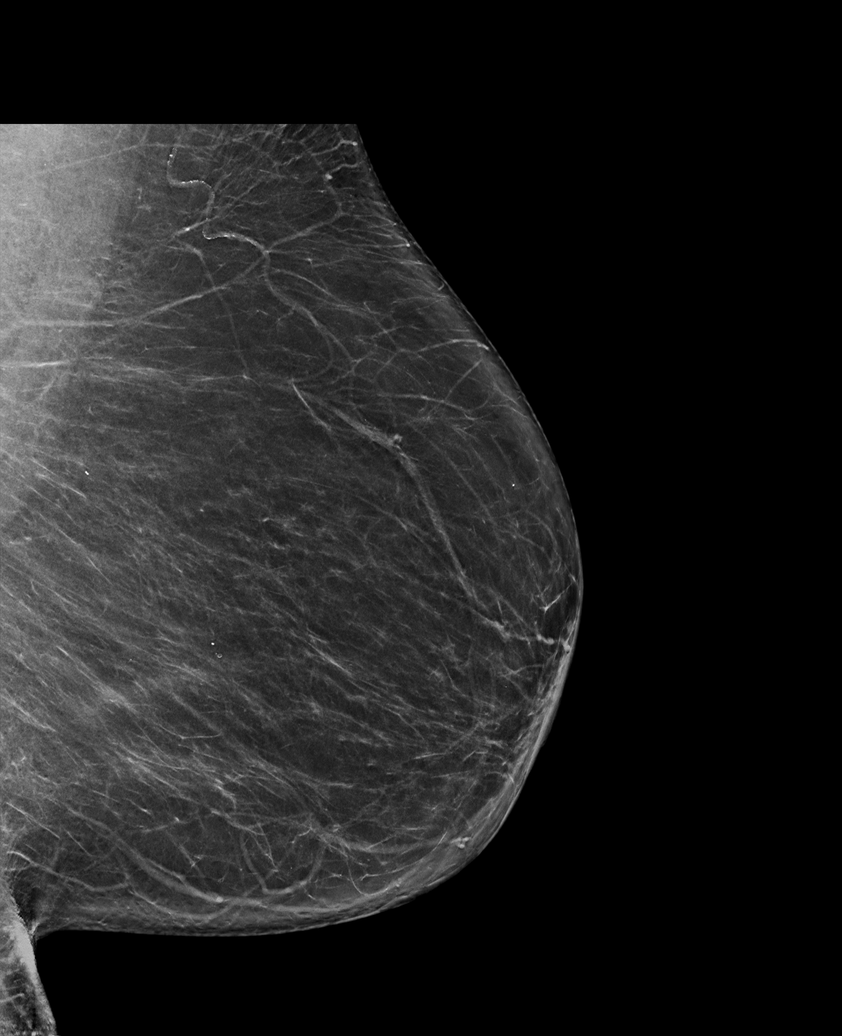

[R CC synth-2D]
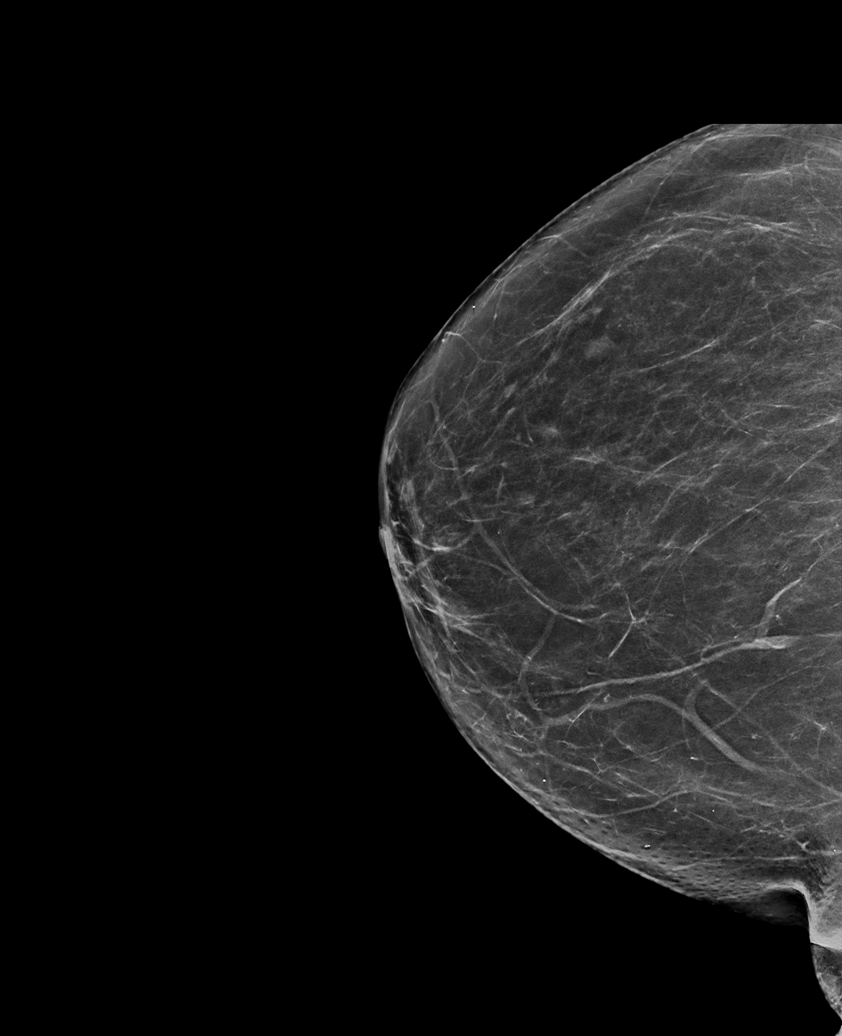

[L CC synth-2D]
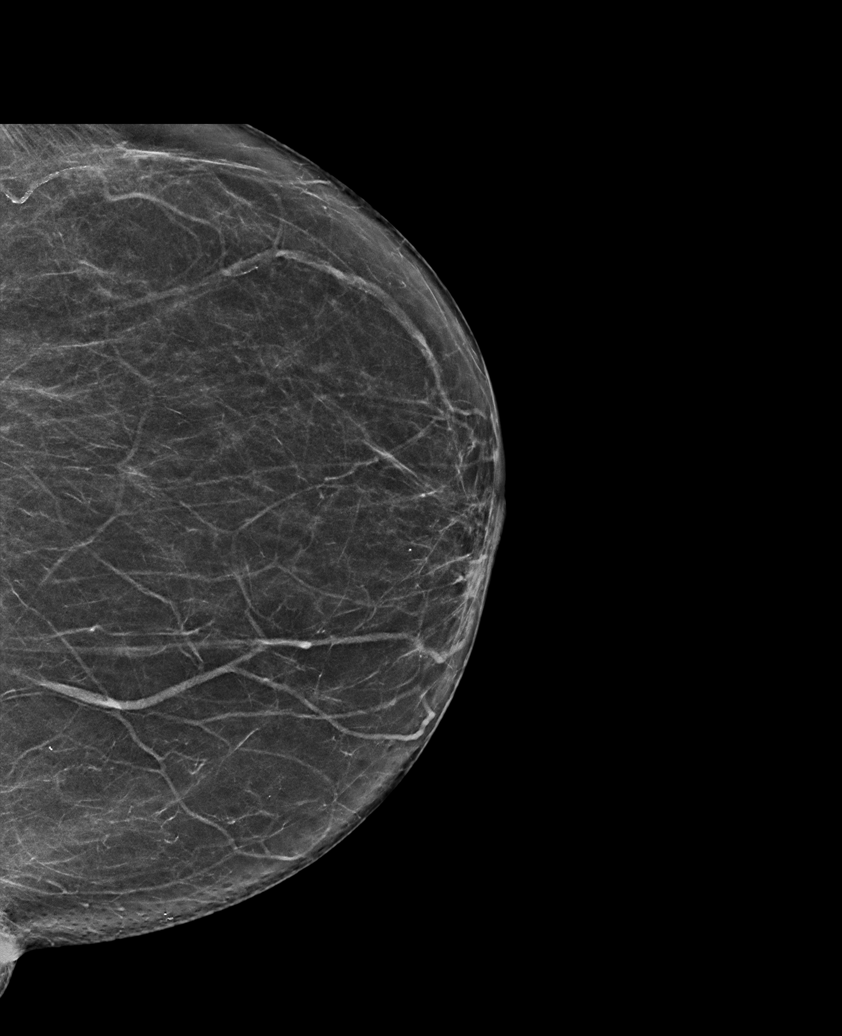

[R MLO tomo · tomo slice 37/72.0]
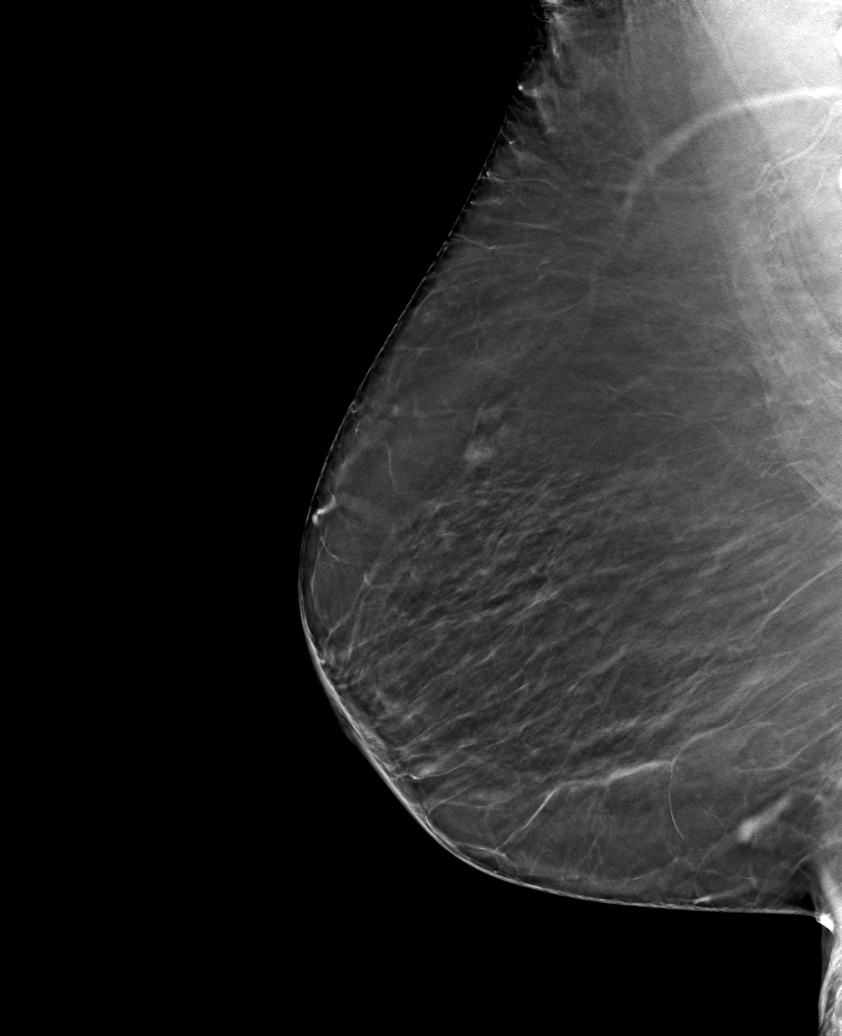

[6 of 25 positions shown; findings below may reference images not displayed]

ACR Breast Density Category b: There are scattered areas of
fibroglandular density.
FINDINGS: There are no findings suspicious for malignancy. The images were
evaluated with computer-aided detection.
IMPRESSION: No mammographic evidence of malignancy. A result letter of this
screening mammogram will be mailed directly to the patient.

RECOMMENDATION:
Screening mammogram in one year. (Code:WJ-I-BG6)

BI-RADS CATEGORY  1: Negative.

## 2022-08-23 DIAGNOSIS — Z6836 Body mass index (BMI) 36.0-36.9, adult: Secondary | ICD-10-CM | POA: Diagnosis not present

## 2022-08-23 DIAGNOSIS — R030 Elevated blood-pressure reading, without diagnosis of hypertension: Secondary | ICD-10-CM | POA: Diagnosis not present

## 2022-08-23 DIAGNOSIS — M5416 Radiculopathy, lumbar region: Secondary | ICD-10-CM | POA: Diagnosis not present

## 2022-08-23 DIAGNOSIS — E669 Obesity, unspecified: Secondary | ICD-10-CM | POA: Diagnosis not present

## 2022-09-05 ENCOUNTER — Ambulatory Visit: Payer: Medicare HMO | Attending: Cardiology | Admitting: Cardiology

## 2022-09-05 NOTE — Progress Notes (Deleted)
Clinical Summary Ms. Degarmo is a 77 y.o.female  She is from [place]. A medical interpreter is present for the visit.    1.History of chest pain 10/2020 nuclear stress: no ischemia 05/2021 echo: 55-60%, no WMAs, indet diastolic - no recent chest pains  02/2022 nuclear stress: no ischemia, significant gut uptake may limit inferior wall interpreation     2.Palpitations 10/2020 monitor: rare ectopy, isoalted 4 beat run of NSVT - occasional palpitations, somewhat often.  - she ran out of propranolol 2 weeks ago.    3.HTN - compliant with meds   4. Hyperlipidemia 05/2021 TC 174 TG 153 HDL 46 LDL 97 - she is on crestor 40m daily, started about 3 months ago     5. TIA - admission 05/2021 -  Past Medical History:  Diagnosis Date   Arthritis    Chronic back pain    Conversion disorder    caused by stress of daughter's death in 203/27/11  Depression    GERD (gastroesophageal reflux disease)    Hypercholesterolemia    Hypertension    Kidney stone    stones   Migraine    Panic attacks    daily, worse the 15th of each month   Thyroid disease      Allergies  Allergen Reactions   Charentais Melon (French Melon) Anaphylaxis   Morphine Swelling    REACTION: swelling   Pineapple Swelling    Throat swelling     Current Outpatient Medications  Medication Sig Dispense Refill   amitriptyline (ELAVIL) 50 MG tablet Take 1 tablet (50 mg total) by mouth at bedtime. 90 tablet 1   aspirin EC 81 MG EC tablet Take 1 tablet (81 mg total) by mouth daily. Swallow whole. 30 tablet 1   clopidogrel (PLAVIX) 75 MG tablet Take 75 mg by mouth daily.     cyanocobalamin (VITAMIN B12) 1000 MCG tablet Take 1,000 mcg by mouth daily.     ezetimibe (ZETIA) 10 MG tablet Take 1 tablet (10 mg total) by mouth daily. 90 tablet 3   nitroGLYCERIN (NITROSTAT) 0.4 MG SL tablet Place 1 tablet (0.4 mg total) under the tongue every 5 (five) minutes x 3 doses as needed for chest pain.  30 tablet 12   oxyCODONE-acetaminophen (PERCOCET/ROXICET) 5-325 MG tablet Take 1 tablet by mouth every 6 (six) hours as needed for severe pain. 15 tablet 0   pantoprazole (PROTONIX) 40 MG tablet Take 1 tablet (40 mg total) by mouth daily. 90 tablet 3   propranolol (INDERAL) 20 MG tablet Take 0.5 tablets (10 mg total) by mouth 2 (two) times daily. (Patient taking differently: Take 20 mg by mouth every evening.) 90 tablet 3   risperiDONE (RISPERDAL) 0.5 MG tablet Take 1 tablet (0.5 mg total) by mouth at bedtime. 90 tablet 1   rosuvastatin (CRESTOR) 40 MG tablet Take 1 tablet (40 mg total) by mouth daily. 90 tablet 3   sertraline (ZOLOFT) 50 MG tablet Take 3 tablets (150 mg total) by mouth at bedtime. (Patient taking differently: Take 100 mg by mouth at bedtime.) 270 tablet 1   solifenacin (VESICARE) 10 MG tablet Take 1 tablet (10 mg total) by mouth daily. 30 tablet 0   topiramate (TOPAMAX) 50 MG tablet Take 1 tablet (50 mg total) by mouth daily. (Patient taking differently: Take 25 mg by mouth daily.) 90 tablet 1   No current facility-administered medications for this visit.     Past Surgical History:  Procedure Laterality  Date   ABDOMINAL HYSTERECTOMY     r ovary removal   APPENDECTOMY     CHOLECYSTECTOMY     CYSTOSCOPY     LEFT HEART CATH AND CORONARY ANGIOGRAPHY N/A 12/26/2017   Procedure: LEFT HEART CATH AND CORONARY ANGIOGRAPHY;  Surgeon: Lorretta Harp, MD;  Location: [facility];  Service: Cardiovascular;  Laterality: N/A;   THYROID SURGERY       Allergies  Allergen Reactions   Charentais Melon (French Melon) Anaphylaxis   Morphine Swelling    REACTION: swelling   Pineapple Swelling    Throat swelling      Family History  Problem Relation Age of Onset   Arthritis Mother    Migraines Mother    Cancer Sister    Cancer Maternal Grandmother      Social History Ms. Beachley reports that she has never smoked. She has never used smokeless tobacco. Ms. Ismail  reports no history of alcohol use.   Review of Systems CONSTITUTIONAL: No weight loss, fever, chills, weakness or fatigue.  HEENT: Eyes: No visual loss, blurred vision, double vision or yellow sclerae.No hearing loss, sneezing, congestion, runny nose or sore throat.  SKIN: No rash or itching.  CARDIOVASCULAR:  RESPIRATORY: No shortness of breath, cough or sputum.  GASTROINTESTINAL: No anorexia, nausea, vomiting or diarrhea. No abdominal pain or blood.  GENITOURINARY: No burning on urination, no polyuria NEUROLOGICAL: No headache, dizziness, syncope, paralysis, ataxia, numbness or tingling in the extremities. No change in bowel or bladder control.  MUSCULOSKELETAL: No muscle, back pain, joint pain or stiffness.  LYMPHATICS: No enlarged nodes. No history of splenectomy.  PSYCHIATRIC: No history of depression or anxiety.  ENDOCRINOLOGIC: No reports of sweating, cold or heat intolerance. No polyuria or polydipsia.  Marland Kitchen   Physical Examination There were no vitals filed for this visit. There were no vitals filed for this visit.  Gen: resting comfortably, no acute distress HEENT: no scleral icterus, pupils equal round and reactive, no palptable cervical adenopathy,  CV Resp: Clear to auscultation bilaterally GI: abdomen is soft, non-tender, non-distended, normal bowel sounds, no hepatosplenomegaly MSK: extremities are warm, no edema.  Skin: warm, no rash Neuro:  no focal deficits Psych: appropriate affect   Diagnostic Studies  10/2020 nuclear stress Lexiscan stress is electrically negative for ischemia Myoview scan is electrically negative for ischemia LVEF is calculated at 47% Consider echocardiogram to further define LVEF / wall motion Overall low risk scan    02/2022 nuclear stress The study is intermediate risk based upon reduced ejection fraction of 46%.   No ST deviation was noted.   LV perfusion is normal.  There is significant bowel loop uptake inhibiting inferior wall read  accuracy seen at both rest and stress There is no evidence of ischemia. There is no evidence of infarction.   Left ventricular function is abnormal.  Ejection fraction 46% global function is mildly reduced. End diastolic cavity size is normal. End systolic cavity size is normal.   Assessment and Plan   Chest pain - no recent symptoms, recent negative stress test - continue to monitor   2. HTN - at goal, continue current meds. Has been on losartan 162m daily   3. Palpitations - recent symptoms, she ran out of propanolol 2 weeks ago. We will refill - prior monitor overall benign, rare ectopy   4. Hyperlipidemia - repeat lipid panel, with recent TIA needs aggressive cholesterol control     JArnoldo Lenis M.D.

## 2022-09-06 ENCOUNTER — Encounter: Payer: Self-pay | Admitting: Cardiology

## 2022-11-24 DIAGNOSIS — N390 Urinary tract infection, site not specified: Secondary | ICD-10-CM | POA: Diagnosis not present

## 2022-11-24 DIAGNOSIS — B952 Enterococcus as the cause of diseases classified elsewhere: Secondary | ICD-10-CM | POA: Diagnosis not present

## 2022-11-24 DIAGNOSIS — M5489 Other dorsalgia: Secondary | ICD-10-CM | POA: Diagnosis not present

## 2023-01-02 DIAGNOSIS — J069 Acute upper respiratory infection, unspecified: Secondary | ICD-10-CM | POA: Diagnosis not present

## 2023-01-02 DIAGNOSIS — M940 Chondrocostal junction syndrome [Tietze]: Secondary | ICD-10-CM | POA: Diagnosis not present

## 2023-01-02 DIAGNOSIS — Z1630 Resistance to unspecified antimicrobial drugs: Secondary | ICD-10-CM | POA: Diagnosis not present

## 2023-01-02 DIAGNOSIS — B974 Respiratory syncytial virus as the cause of diseases classified elsewhere: Secondary | ICD-10-CM | POA: Diagnosis not present

## 2023-02-12 ENCOUNTER — Ambulatory Visit (INDEPENDENT_AMBULATORY_CARE_PROVIDER_SITE_OTHER): Payer: Medicare HMO | Admitting: Family Medicine

## 2023-02-12 VITALS — BP 113/78 | HR 75 | Temp 98.2°F | Ht 63.0 in | Wt 202.0 lb

## 2023-02-12 DIAGNOSIS — Z130 Encounter for screening for diseases of the blood and blood-forming organs and certain disorders involving the immune mechanism: Secondary | ICD-10-CM

## 2023-02-12 DIAGNOSIS — G43709 Chronic migraine without aura, not intractable, without status migrainosus: Secondary | ICD-10-CM

## 2023-02-12 DIAGNOSIS — E782 Mixed hyperlipidemia: Secondary | ICD-10-CM

## 2023-02-12 DIAGNOSIS — R739 Hyperglycemia, unspecified: Secondary | ICD-10-CM

## 2023-02-12 DIAGNOSIS — N904 Leukoplakia of vulva: Secondary | ICD-10-CM | POA: Insufficient documentation

## 2023-02-12 DIAGNOSIS — G8929 Other chronic pain: Secondary | ICD-10-CM | POA: Diagnosis not present

## 2023-02-12 DIAGNOSIS — M545 Low back pain, unspecified: Secondary | ICD-10-CM

## 2023-02-12 DIAGNOSIS — I10 Essential (primary) hypertension: Secondary | ICD-10-CM | POA: Diagnosis not present

## 2023-02-12 MED ORDER — CLOBETASOL PROPIONATE 0.05 % EX OINT: 1.0000 | TOPICAL_OINTMENT | Freq: Every evening | CUTANEOUS | 1 refills | Status: DC

## 2023-02-12 NOTE — Assessment & Plan Note (Signed)
Stable.  Continue Topamax and propranolol.

## 2023-02-12 NOTE — Progress Notes (Signed)
Subjective:  Patient ID: Kendra [name], female    DOB: [birth year]  Age: 77 y.o. MRN: [id number]  CC:  Re-establish/follow up   HPI:  77 year old female with the below mentioned medical problems presents for follow-up.  Spanish interpreter present today.  Patient has stopped taking her cholesterol medication.  I believe that there has been a miscommunication.  Patient states that she was told that her cholesterol was well-controlled and therefore she stopped taking the medication.  I advised the patient that his left probably well-controlled because she was taking the medications.  Will assess lipid panel.  Patient requesting refill on betamethasone/clotrimazole.  She is using this for vulvar pruritus.  Suspect lichen sclerosis.  Needs exam today.  Patient is amenable to this.  Patient's blood pressure is well-controlled.  She is only on propranolol.  I believe that this is more for migraines that it is for hypertension.  Remains on propranolol and topiramate regarding migraines.  Patient also remains on amitriptyline and Risperdal.  Has a history of conversion disorder.  Patient reports continued chronic low back pain.  She has significant degenerative changes.  Has previously been seen by physiatry and received injections.  She is requesting x-ray imaging today.  Patient Active Problem List   Diagnosis Date Noted   Lichen sclerosus of vulva 02/12/2023   Prediabetes 11/08/2021   Chronic migraine without aura 08/10/2021   Chronic low back pain 08/10/2021   Conversion disorder    History of DVT (deep vein thrombosis) 12/26/2017   Essential hypertension 12/25/2017   Mixed hyperlipidemia 12/25/2017   Depression 03/17/2010   OSA on CPAP 10/12/2009   GERD 03/07/2007    Social Hx   Social History   Socioeconomic History   Marital status: Married    Spouse name: Not on file   Number of children: Not on file   Years of education: Not on file   Highest education level: Not on  file  Occupational History   Not on file  Tobacco Use   Smoking status: Never   Smokeless tobacco: Never  Vaping Use   Vaping status: Never Used  Substance and Sexual Activity   Alcohol use: No   Drug use: No   Sexual activity: Never    Birth control/protection: None  Other Topics Concern   Not on file  Social History Narrative   Not on file   Social Determinants of Health   Financial Resource Strain: Not on file  Food Insecurity: Not on file  Transportation Needs: Not on file  Physical Activity: Not on file  Stress: Not on file  Social Connections: Not on file    Review of Systems Per HPI  Objective:  BP 113/78   Pulse 75   Temp 98.2 F (36.8 C)   Ht 5\' 3"  (1.6 m)   Wt 202 lb (91.6 kg)   SpO2 95%   BMI 35.78 kg/m      02/12/2023    3:05 PM 06/01/2022    7:37 PM 06/01/2022    6:30 PM  BP/Weight  Systolic BP 113 162 149  Diastolic BP 78 96 80  Wt. (Lbs) 202    BMI 35.78 kg/m2      Physical Exam Vitals reviewed. Exam conducted with a chaperone present.  Constitutional:      Appearance: Normal appearance. She is obese.  Eyes:     General:        Right eye: No discharge.        Left eye:  No discharge.     Conjunctiva/sclera: Conjunctivae normal.  Cardiovascular:     Rate and Rhythm: Normal rate and regular rhythm.  Pulmonary:     Effort: Pulmonary effort is normal.     Breath sounds: Normal breath sounds. No wheezing, rhonchi or rales.  Genitourinary:    Comments: Labia majora with erythema.  There is also significant hypopigmentation consistent with lichen sclerosis. Neurological:     Mental Status: She is alert.  Psychiatric:        Mood and Affect: Mood normal.        Behavior: Behavior normal.     Lab Results  Component Value Date   WBC 6.5 06/01/2022   HGB 13.8 06/01/2022   HCT 42.5 06/01/2022   PLT 226 06/01/2022   GLUCOSE 101 (H) 06/01/2022   CHOL 186 08/28/2021   TRIG 121 08/28/2021   HDL 57 08/28/2021   LDLCALC 105 (H)  08/28/2021   ALT 37 06/01/2022   AST 36 06/01/2022   NA 140 06/01/2022   K 4.1 06/01/2022   CL 106 06/01/2022   CREATININE 0.67 06/01/2022   BUN 18 06/01/2022   CO2 28 06/01/2022   TSH 0.995 02/14/2022   INR 1.0 02/13/2022   HGBA1C 5.8 (H) 06/02/2021     Assessment & Plan:   Problem List Items Addressed This Visit       Cardiovascular and Mediastinum   Chronic migraine without aura    Stable.  Continue Topamax and propranolol.      Essential hypertension - Primary    BP stable.      Relevant Orders   CMP14+EGFR   Microalbumin / creatinine urine ratio     Genitourinary   Lichen sclerosus of vulva    Treating with Clobetasol.         Other   Chronic low back pain    Xray today. Will likely need to return to PM&R or neurosurgery.      Relevant Orders   DG Lumbar Spine Complete   Mixed hyperlipidemia    Lab to assess.       Relevant Orders   Lipid panel   Other Visit Diagnoses     Screening for deficiency anemia       Relevant Orders   CBC   Blood glucose elevated       Relevant Orders   Hemoglobin A1c       Meds ordered this encounter  Medications   clobetasol ointment (TEMOVATE) 0.05 %    Sig: Apply 1 Application topically at bedtime.    Dispense:  60 g    Refill:  1    Follow-up:  6 months  Sanjna Haskew Adriana Simas DO [facility]

## 2023-02-12 NOTE — Assessment & Plan Note (Signed)
Treating with Clobetasol.

## 2023-02-12 NOTE — Patient Instructions (Signed)
Labs ordered.  Medication as prescribed.  Follow up in 6 months.

## 2023-02-12 NOTE — Assessment & Plan Note (Signed)
Xray today. Will likely need to return to PM&R or neurosurgery.

## 2023-02-12 NOTE — Assessment & Plan Note (Signed)
BP stable.

## 2023-02-12 NOTE — Assessment & Plan Note (Signed)
Lab to assess.

## 2023-02-13 LAB — CBC
Hemoglobin: 14.4 g/dL (ref 11.1–15.9)
Platelets: 264 10*3/uL (ref 150–450)
RBC: 4.44 x10E6/uL (ref 3.77–5.28)
RDW: 13.9 % (ref 11.7–15.4)
WBC: 6 10*3/uL (ref 3.4–10.8)

## 2023-02-13 LAB — CMP14+EGFR
AST: 22 IU/L (ref 0–40)
Albumin: 4.4 g/dL (ref 3.8–4.8)
BUN/Creatinine Ratio: 19 (ref 12–28)
CO2: 27 mmol/L (ref 20–29)
Calcium: 9.5 mg/dL (ref 8.7–10.3)
Chloride: 103 mmol/L (ref 96–106)
Creatinine, Ser: 0.78 mg/dL (ref 0.57–1.00)
Globulin, Total: 2.3 g/dL (ref 1.5–4.5)
Glucose: 99 mg/dL (ref 70–99)

## 2023-02-13 LAB — MICROALBUMIN / CREATININE URINE RATIO

## 2023-02-13 LAB — HEMOGLOBIN A1C: Est. average glucose Bld gHb Est-mCnc: 128 mg/dL

## 2023-02-13 LAB — LIPID PANEL: Cholesterol, Total: 370 mg/dL — ABNORMAL HIGH (ref 100–199)

## 2023-02-14 ENCOUNTER — Other Ambulatory Visit: Payer: Self-pay

## 2023-02-14 DIAGNOSIS — R748 Abnormal levels of other serum enzymes: Secondary | ICD-10-CM

## 2023-02-14 LAB — CBC
Hematocrit: 42.7 % (ref 34.0–46.6)
MCH: 32.4 pg (ref 26.6–33.0)
MCHC: 33.7 g/dL (ref 31.5–35.7)
MCV: 96 fL (ref 79–97)

## 2023-02-14 LAB — MICROALBUMIN / CREATININE URINE RATIO
Creatinine, Urine: 119.1 mg/dL
Microalbumin, Urine: 10.8 ug/mL

## 2023-02-14 LAB — LIPID PANEL
Chol/HDL Ratio: 5.7 ratio — ABNORMAL HIGH (ref 0.0–4.4)
HDL: 65 mg/dL (ref >39)
LDL Chol Calc (NIH): 275 mg/dL — ABNORMAL HIGH (ref 0–99)
Triglycerides: 155 mg/dL — ABNORMAL HIGH (ref 0–149)
VLDL Cholesterol Cal: 30 mg/dL (ref 5–40)

## 2023-02-14 LAB — CMP14+EGFR
ALT: 16 IU/L (ref 0–32)
Alkaline Phosphatase: 153 IU/L — ABNORMAL HIGH (ref 44–121)
BUN: 15 mg/dL (ref 8–27)
Bilirubin Total: 0.7 mg/dL (ref 0.0–1.2)
Potassium: 4.8 mmol/L (ref 3.5–5.2)
Sodium: 140 mmol/L (ref 134–144)
Total Protein: 6.7 g/dL (ref 6.0–8.5)
eGFR: 77 mL/min/{1.73_m2} (ref >59)

## 2023-02-14 LAB — HEMOGLOBIN A1C: Hgb A1c MFr Bld: 6.1 % — ABNORMAL HIGH (ref 4.8–5.6)

## 2023-02-14 MED ORDER — ROSUVASTATIN CALCIUM 40 MG PO TABS: 40.0000 mg | ORAL_TABLET | Freq: Every day | ORAL | 3 refills | Status: DC

## 2023-02-14 MED ORDER — EZETIMIBE 10 MG PO TABS: 10.0000 mg | ORAL_TABLET | Freq: Every day | ORAL | 3 refills | Status: DC

## 2023-03-04 ENCOUNTER — Ambulatory Visit (HOSPITAL_COMMUNITY)
Admission: RE | Admit: 2023-03-04 | Discharge: 2023-03-04 | Disposition: A | Discharge status: Home / Self Care | Payer: Medicare HMO | Source: Ambulatory Visit | Attending: Family Medicine | Admitting: Family Medicine

## 2023-03-04 DIAGNOSIS — G8929 Other chronic pain: Secondary | ICD-10-CM | POA: Insufficient documentation

## 2023-03-04 DIAGNOSIS — M545 Low back pain, unspecified: Secondary | ICD-10-CM | POA: Insufficient documentation

## 2023-03-04 DIAGNOSIS — M47816 Spondylosis without myelopathy or radiculopathy, lumbar region: Secondary | ICD-10-CM | POA: Diagnosis not present

## 2023-03-06 ENCOUNTER — Ambulatory Visit: Payer: Medicare HMO | Admitting: Student

## 2023-03-06 NOTE — Addendum Note (Signed)
Addended by: Margaretha Sheffield on: 03/06/2023 11:26 AM   Modules accepted: Orders

## 2023-03-13 ENCOUNTER — Telehealth: Payer: Self-pay

## 2023-03-13 NOTE — Telephone Encounter (Signed)
Prescription Request  03/13/2023  LOV: Visit date not found  What is the name of the medication or equipment? solifenacin (VESICARE) 10 MG tablet this was from previous doctor   Have you contacted your pharmacy to request a refill? Yes   Which pharmacy would you like this sent to?  [facility] [address], [place] - [address] [place] [address] Phone: [phone] Fax: [phone]    Patient notified that their request is being sent to the clinical staff for review and that they should receive a response within 2 business days.   Please advise at Mobile [phone] (mobile)

## 2023-03-14 ENCOUNTER — Other Ambulatory Visit: Payer: Self-pay | Admitting: Nurse Practitioner

## 2023-03-14 ENCOUNTER — Other Ambulatory Visit: Payer: Self-pay | Admitting: Family Medicine

## 2023-03-14 DIAGNOSIS — K219 Gastro-esophageal reflux disease without esophagitis: Secondary | ICD-10-CM

## 2023-03-14 MED ORDER — SOLIFENACIN SUCCINATE 10 MG PO TABS: 10.0000 mg | ORAL_TABLET | Freq: Every day | ORAL | 0 refills | Status: DC

## 2023-03-27 ENCOUNTER — Ambulatory Visit (INDEPENDENT_AMBULATORY_CARE_PROVIDER_SITE_OTHER): Payer: Medicare HMO | Admitting: Orthopaedic Surgery

## 2023-03-27 ENCOUNTER — Encounter: Payer: Self-pay | Admitting: Orthopaedic Surgery

## 2023-03-27 ENCOUNTER — Telehealth: Payer: Self-pay

## 2023-03-27 VITALS — Ht 63.0 in | Wt 203.0 lb

## 2023-03-27 DIAGNOSIS — M545 Low back pain, unspecified: Secondary | ICD-10-CM

## 2023-03-27 DIAGNOSIS — G8929 Other chronic pain: Secondary | ICD-10-CM

## 2023-03-27 NOTE — Progress Notes (Signed)
Office Visit Note   Patient: Kendra [name]           Date of Birth: [birth year]           MRN: [id number] Visit Date: 03/27/2023              Requested by: Tommie Sams, DO [address] [place],  [place] [address] PCP: Tommie Sams, DO   Assessment & Plan: Visit Diagnoses:  1. Chronic low back pain, unspecified back pain laterality, unspecified whether sciatica present     Plan: Reviewed with patient that and husband through interpreter that she has significant weakness lower extremity strength time at home in a wheelchair and uses her rolling walker with 5 inch seat but can only walk a few steps she gets weak.  She needs some outpatient physical therapy in [place] where she lives.  She states she really does not want to have surgery.  We reviewed the CT scan done renal protocol looking for stones both 10 years ago as well as 2 years ago with progressive disc degeneration.  She had a history of epidurals in the past which really did not help.  Her legs are so weak she needs therapy more than diagnostic imaging at this point.  I will recheck her in 2 months.  She does have spurring at L3-4 posteriorly which may can contribute to lumbar spinal stenosis.  Extensive time reviewing imaging studies outlined treatment plan previous myelogram CT scan, CT scans, plain radiographs.  Follow-Up Instructions: Return in about 2 months (around 05/27/2023).   Orders:  No orders of the defined types were placed in this encounter.  No orders of the defined types were placed in this encounter.     Procedures: No procedures performed   Clinical Data: No additional findings.   Subjective: Chief Complaint  Patient presents with   Lower Back - Pain    The pain goes down into my right leg. Have had ESI Injections in the past and they didn't help. OTC medicine and heat helps a little.    HPI 77 year old female here with her husband and Spanish interpreter with chronic low back pain  history of conversion disorder depression hypertension history of DVT.  She has sleep apnea.  She has a rolling walker with seat at home has a cane she is using today and mostly at home uses a wheelchair.  Attending chronic back pain severe for greater than 5 years.  No recent lumbar MRI scan.  Previously had [facility] myelogram CT scan cervical spine thoracic spine and lumbar spine also several renal CTs looking for stones which images lumbar spine.  More recent significant progressive narrowing at L2-3 and posterior spurring which narrows the AP diameter of the canal at L3-4.  She has some calcification of her abdominal aorta.  In the past office note review showed she stopped her cholesterol medication.  Review of Systems all systems noncontributory to HPI.   Objective: Vital Signs: Ht 5\' 3"  (1.6 m)   Wt 203 lb (92.1 kg)   BMI 35.96 kg/m   Physical Exam Constitutional:      Appearance: She is well-developed.  HENT:     Head: Normocephalic.     Right Ear: External ear normal.     Left Ear: External ear normal. There is no impacted cerumen.  Eyes:     Pupils: Pupils are equal, round, and reactive to light.  Neck:     Thyroid: No thyromegaly.  Trachea: No tracheal deviation.  Cardiovascular:     Rate and Rhythm: Normal rate.  Pulmonary:     Effort: Pulmonary effort is normal.  Abdominal:     Palpations: Abdomen is soft.  Musculoskeletal:     Cervical back: No rigidity.  Skin:    General: Skin is warm and dry.  Neurological:     Mental Status: She is alert and oriented to person, place, and time.  Psychiatric:        Behavior: Behavior normal.     Ortho Exam patient has no pain with internal/external rotation of her hips knees reach full extension no crepitus knee extension she has significant weakness of hip flexors hamstrings calf gastrocsoleus ankle dorsiflexion plantarflexion and severe deconditioning.  Her husband has to pull her up out of the wheelchair she is able to  walk back-and-forth across the exam room just with her cane but has weakness and gets tired.  Specialty Comments:  No specialty comments available.  Imaging: No results found.   PMFS History: Patient Active Problem List   Diagnosis Date Noted   Lichen sclerosus of vulva 02/12/2023   Prediabetes 11/08/2021   Chronic migraine without aura 08/10/2021   Chronic low back pain 08/10/2021   Conversion disorder    History of DVT (deep vein thrombosis) 12/26/2017   Essential hypertension 12/25/2017   Mixed hyperlipidemia 12/25/2017   Depression 03/17/2010   OSA on CPAP 10/12/2009   GERD 03/07/2007   Past Medical History:  Diagnosis Date   Arthritis    Chronic back pain    Conversion disorder    caused by stress of daughter's death in 04/08/10   Depression    GERD (gastroesophageal reflux disease)    Hypercholesterolemia    Hypertension    Kidney stone    stones   Migraine    Panic attacks    daily, worse the 15th of each month   Thyroid disease     Family History  Problem Relation Age of Onset   Arthritis Mother    Migraines Mother    Cancer Sister    Cancer Maternal Grandmother     Past Surgical History:  Procedure Laterality Date   ABDOMINAL HYSTERECTOMY     r ovary removal   APPENDECTOMY     CHOLECYSTECTOMY     CYSTOSCOPY     LEFT HEART CATH AND CORONARY ANGIOGRAPHY N/A 12/26/2017   Procedure: LEFT HEART CATH AND CORONARY ANGIOGRAPHY;  Surgeon: Runell Gess, MD;  Location: [facility];  Service: Cardiovascular;  Laterality: N/A;   THYROID SURGERY     Social History   Occupational History   Not on file  Tobacco Use   Smoking status: Never   Smokeless tobacco: Never  Vaping Use   Vaping status: Never Used  Substance and Sexual Activity   Alcohol use: No   Drug use: No   Sexual activity: Never    Birth control/protection: None

## 2023-03-27 NOTE — Addendum Note (Signed)
Addended by: Jodene Nam A on: 03/27/2023 10:43 AM   Modules accepted: Orders

## 2023-03-27 NOTE — Telephone Encounter (Signed)
Prescription Request  03/27/2023  LOV: Visit date not found  What is the name of the medication or equipment? topiramate (TOPAMAX) 50 MG tablet ,propranolol (INDERAL) 20 MG tablet   Have you contacted your pharmacy to request a refill? Yes   Which pharmacy would you like this sent to?  [facility] [address], [place] - [address] [place] [address] Phone: [phone] Fax: [phone]    Patient notified that their request is being sent to the clinical staff for review and that they should receive a response within 2 business days.   Please advise at Mobile [phone] (mobile)

## 2023-03-28 ENCOUNTER — Ambulatory Visit: Payer: Medicare HMO | Admitting: Orthopaedic Surgery

## 2023-03-28 NOTE — Telephone Encounter (Signed)
It states she only takes it once a day instead of twice a day

## 2023-03-28 NOTE — Telephone Encounter (Signed)
Kendra Sams, DO     How is she taking the medication?

## 2023-04-01 ENCOUNTER — Other Ambulatory Visit: Payer: Self-pay | Admitting: Family Medicine

## 2023-04-01 DIAGNOSIS — I10 Essential (primary) hypertension: Secondary | ICD-10-CM

## 2023-04-01 MED ORDER — PROPRANOLOL HCL 20 MG PO TABS
10.0000 mg | ORAL_TABLET | Freq: Two times a day (BID) | ORAL | 3 refills | Status: DC
Start: 2023-04-01 — End: 2023-06-11

## 2023-04-01 MED ORDER — TOPIRAMATE 50 MG PO TABS: 50.0000 mg | ORAL_TABLET | Freq: Every day | ORAL | 1 refills | Status: DC

## 2023-04-11 ENCOUNTER — Other Ambulatory Visit: Payer: Self-pay

## 2023-04-11 ENCOUNTER — Ambulatory Visit: Payer: Medicare HMO | Admitting: Obstetrics & Gynecology

## 2023-04-11 ENCOUNTER — Encounter: Payer: Self-pay | Admitting: Obstetrics & Gynecology

## 2023-04-11 ENCOUNTER — Ambulatory Visit (HOSPITAL_COMMUNITY): Payer: Medicare HMO | Attending: Orthopaedic Surgery | Admitting: Physical Therapy

## 2023-04-11 VITALS — BP 139/89 | HR 86

## 2023-04-11 DIAGNOSIS — G8929 Other chronic pain: Secondary | ICD-10-CM | POA: Insufficient documentation

## 2023-04-11 DIAGNOSIS — N362 Urethral caruncle: Secondary | ICD-10-CM | POA: Diagnosis not present

## 2023-04-11 DIAGNOSIS — M6281 Muscle weakness (generalized): Secondary | ICD-10-CM | POA: Insufficient documentation

## 2023-04-11 DIAGNOSIS — N3941 Urge incontinence: Secondary | ICD-10-CM

## 2023-04-11 DIAGNOSIS — N3281 Overactive bladder: Secondary | ICD-10-CM

## 2023-04-11 DIAGNOSIS — R262 Difficulty in walking, not elsewhere classified: Secondary | ICD-10-CM | POA: Diagnosis not present

## 2023-04-11 DIAGNOSIS — M545 Low back pain, unspecified: Secondary | ICD-10-CM | POA: Insufficient documentation

## 2023-04-11 DIAGNOSIS — M5459 Other low back pain: Secondary | ICD-10-CM | POA: Insufficient documentation

## 2023-04-11 DIAGNOSIS — L900 Lichen sclerosus et atrophicus: Secondary | ICD-10-CM | POA: Diagnosis not present

## 2023-04-11 MED ORDER — ESTRADIOL 0.1 MG/GM VA CREA: TOPICAL_CREAM | VAGINAL | 12 refills | Status: DC

## 2023-04-11 MED ORDER — CLOBETASOL PROPIONATE 0.05 % EX OINT: TOPICAL_OINTMENT | CUTANEOUS | 11 refills | Status: DC

## 2023-04-11 MED ORDER — SOLIFENACIN SUCCINATE 10 MG PO TABS: 10.0000 mg | ORAL_TABLET | Freq: Every day | ORAL | 11 refills | Status: DC

## 2023-04-11 NOTE — Addendum Note (Signed)
Addended by: Bella Kennedy on: 04/11/2023 10:24 AM   Modules accepted: Orders

## 2023-04-11 NOTE — Therapy (Signed)
OUTPATIENT PHYSICAL THERAPY THORACOLUMBAR EVALUATION   Patient Name: Kendra [name] MRN: [id number] DOB:12-Nov-1945, 77 y.o., female Today's Date: 04/11/2023  END OF SESSION:  PT End of Session - 04/11/23 1018     Visit Number 1    Number of Visits 12    Date for PT Re-Evaluation 05/23/23    Authorization Type [employer]  Medicare    Authorization Time Period requested auth    PT Start Time 0939    PT Stop Time 1015    PT Time Calculation (min) 36 min    Activity Tolerance Patient tolerated treatment well             Past Medical History:  Diagnosis Date   Arthritis    Chronic back pain    Conversion disorder    caused by stress of daughter's death in 2010-06-12   Depression    GERD (gastroesophageal reflux disease)    Hypercholesterolemia    Hypertension    Kidney stone    stones   Migraine    Panic attacks    daily, worse the 15th of each month   Thyroid disease    Past Surgical History:  Procedure Laterality Date   ABDOMINAL HYSTERECTOMY     r ovary removal   APPENDECTOMY  1970   CHOLECYSTECTOMY  12-Jun-2010   CYSTOSCOPY  1990   KIDNEY STONE SURGERY  2009-06-12   LAPAROSCOPY  1984   LEFT HEART CATH AND CORONARY ANGIOGRAPHY N/A 12/26/2017   Procedure: LEFT HEART CATH AND CORONARY ANGIOGRAPHY;  Surgeon: Runell Gess, MD;  Location: [facility];  Service: Cardiovascular;  Laterality: N/A;   OVARY SURGERY Left 1980   STERILIZATION  1979   THYROID SURGERY  June 12, 2009   Patient Active Problem List   Diagnosis Date Noted   Lichen sclerosus of vulva 02/12/2023   Prediabetes 11/08/2021   Chronic migraine without aura 08/10/2021   Chronic low back pain 08/10/2021   Conversion disorder    History of DVT (deep vein thrombosis) 12/26/2017   Essential hypertension 12/25/2017   Mixed hyperlipidemia 12/25/2017   Depression 03/17/2010   OSA on CPAP 10/12/2009   GERD 03/07/2007    PCP: Bradley Ferris  REFERRING PROVIDER: Eldred Manges, MD  REFERRING DIAG: M54.50,G89.29  (ICD-10-CM) - Chronic low back pain, unspecified back pain laterality, unspecified whether sciatica present  Rationale for Evaluation and Treatment: Rehabilitation  THERAPY DIAG:  Low back pain Muscle weakness   ONSET DATE: chronic  SUBJECTIVE STATEMENT: Pt states that she hurts greater on her left than her right.  The pain goes into her leg every day, to her knee area.  She is able to sit for 15 minutes, Stands for 5 minutes, she can walk with a cane but her pain will increase almost immediately.    PERTINENT HISTORY:  Per MD:  77 year old female here with her husband and Spanish interpreter with chronic low back pain history of conversion disorder depression hypertension history of DVT. She has sleep apnea. She has a rolling walker with seat at home has a cane she is using today and mostly at home uses a wheelchair. Attending chronic back pain severe for greater than 5 years. No recent lumbar MRI scan. Previously had [facility]ner myelogram CT scan cervical spine thoracic spine and lumbar spine also several renal CTs looking for stones which images lumbar spine. More recent significant progressive narrowing at L2-3 and posterior spurring which narrows the AP diameter of the canal at L3-4.   she has significant weakness lower extremity strength time at home in a wheelchair and uses her rolling walker with 5 inch seat but can only walk a few steps she gets weak. She needs some outpatient physical therapy in [place] where she lives. She states she really does not want to have surgery. Her legs are so weak she needs therapy more than diagnostic imaging at this point. I will recheck her in 2 months.  PAIN:  Are you having pain? Yes: NPRS scale: 8/10, worst pain has been a 9, best 5 Pain location: low back  Pain  description: ache and throb Aggravating factors: wt bearing  Relieving factors: rest   PRECAUTIONS: Fall  WEIGHT BEARING RESTRICTIONS: No  FALLS:  Has patient fallen in last 6 months? No  LIVING ENVIRONMENT: Lives with: lives with their spouse Lives in: House/apartment Stairs: No Has following equipment at home: Retail banker - 2 wheeled and 4 wheeled   PLOF: Requires assistive device for independence  PATIENT GOALS: To be able to walk around her house. Decreased pain.   NEXT MD VISIT: November   OBJECTIVE:   PATIENT SURVEYS:  FOTO 30  COGNITION: Overall cognitive status: Within functional limits for tasks assessed      POSTURE: rounded shoulders, forward head, decreased lumbar lordosis, and increased thoracic kyphosis   LUMBAR ROM:   AROM eval  Flexion   Extension 18-reps cause no change   Right lateral flexion   Left lateral flexion   Right rotation   Left rotation    (Blank rows = not tested)  LOWER EXTREMITY ROM:   WFL  LOWER EXTREMITY MMT:    MMT Right eval Left eval  Hip flexion 3 2+  Hip extension 2+ 2+  Hip abduction 3- 3-  Hip adduction    Hip internal rotation    Hip external rotation    Knee flexion 3 3  Knee extension 5 4+  Ankle dorsiflexion 3 3  Ankle plantarflexion    Ankle inversion    Ankle eversion     (Blank rows = not tested)   FUNCTIONAL TESTS:  30 seconds chair stand test: unable to stand without UE support:  With B UE support 8x ; for healthy adults pt age no UE 12 would be average.  2 minute walk test:140 ft  with small quad cane , antalgic and unsteady gt  Single leg stance: unable    TODAY'S TREATMENT:  DATE: 04/11/23:  evaluation  Supine: bridge x 5 Sitting: abdominal set x 5 Bed mobility     PATIENT EDUCATION:  Education details: HEP Person educated: Patient and  Spouse Education method: Explanation, Demonstration, Actor cues, Verbal cues, and Handouts Education comprehension: returned demonstration  HOME EXERCISE PROGRAM: Access Code: [email or web address] URL: [email or web address] Date: 04/11/2023 Prepared by: Virgina Organ  Exercises - Supine Bridge  - 3 x daily - 7 x weekly - 1 sets - 10 reps - 3seconds hold - Seated Transversus Abdominis Bracing  - 3 x daily - 7 x weekly - 1 sets - 10 reps - 3 seconds  hold  ASSESSMENT:  CLINICAL IMPRESSION: Patient is a 77 y.o. female who was seen today for physical therapy evaluation and treatment for low back pain. Evaluation demonstrates decreased activity tolerance, decreased ROM, decreased strength, decreased balance, decreased mobility and increased pain.  Ms. Siemens will benefit from skilled PT to address these deficits and maximize her functioning ability.   OBJECTIVE IMPAIRMENTS: decreased activity tolerance, decreased balance, decreased mobility, difficulty walking, decreased ROM, decreased strength, and pain.   ACTIVITY LIMITATIONS: carrying, lifting, standing, squatting, stairs, transfers, bed mobility, dressing, and locomotion level  PARTICIPATION LIMITATIONS: meal prep, cleaning, laundry, shopping, and community activity  PERSONAL FACTORS: Fitness and Time since onset of injury/illness/exacerbation are also affecting patient's functional outcome.   REHAB POTENTIAL: Good  CLINICAL DECISION MAKING: Evolving/moderate complexity  EVALUATION COMPLEXITY: Moderate   GOALS: Goals reviewed with patient? No  SHORT TERM GOALS: Target date: 05/02/23  Pt to be I in HEp in order to decrease her pain to no greater than a 5/10  Baseline: Goal status: INITIAL  2.  PT strength to be increased 1/2 grade to allow pt to be able to walk for 10 minutes without increased pain  Baseline:  Goal status: INITIAL  3.  Pt to be able to come sit to stand from a chair without UE assist.  Baseline:   Goal status: INITIAL  4.  Pt to be using good bed body mechanics  Baseline:  Goal status: INITIAL   LONG TERM GOALS: Target date: 05/23/23  Pt to be I in HEp in an advanced HEP order to decrease her pain to no greater than a 3/10  Baseline:  Goal status: INITIAL  2.  PT strength to be increased 1 grade to allow pt to be able to walk for 20 minutes without increased pain  Baseline:  Goal status: INITIAL  3.  PT to be able to come sit to stand from a couch without UE assist  Baseline:  Goal status: INITIAL  4.  PT to be able to single leg stance for 10 seconds on each leg to decrease risk of falling  Baseline:  Goal status: INITIAL   PLAN:  PT FREQUENCY: 2x/week  PT DURATION: 6 weeks  PLANNED INTERVENTIONS: Therapeutic exercises, Balance training, Gait training, Patient/Family education, Self Care, and Manual therapy.  PLAN FOR NEXT SESSION: begin decompression exercises continue with strengthening and education on body mechanics.   Virgina Organ, PT CLT [phone]  04/11/2023, 10:19 AM

## 2023-04-11 NOTE — Progress Notes (Signed)
Chief Complaint  Patient presents with   Pelvic Pain      77 y.o. No obstetric history on file. No LMP recorded. Patient has had a hysterectomy. The current method of family planning is status post hysterectomy.  Outpatient Encounter Medications as of 04/11/2023  Medication Sig   amitriptyline (ELAVIL) 50 MG tablet Take 1 tablet (50 mg total) by mouth at bedtime.   aspirin EC 81 MG tablet Take 81 mg by mouth daily. Swallow whole.   clobetasol ointment (TEMOVATE) 0.05 % Apply to area at bedtime nightly   clopidogrel (PLAVIX) 75 MG tablet Take 75 mg by mouth daily.   cyanocobalamin (VITAMIN B12) 1000 MCG tablet Take 1,000 mcg by mouth daily.   estradiol (ESTRACE) 0.1 MG/GM vaginal cream Apply a small amount to outside of urethra qhs   ezetimibe (ZETIA) 10 MG tablet Take 1 tablet (10 mg total) by mouth daily.   nitroGLYCERIN (NITROSTAT) 0.4 MG SL tablet Place 1 tablet (0.4 mg total) under the tongue every 5 (five) minutes x 3 doses as needed for chest pain.   pantoprazole (PROTONIX) 40 MG tablet Take 1 tablet by mouth once daily   propranolol (INDERAL) 20 MG tablet Take 0.5 tablets (10 mg total) by mouth 2 (two) times daily.   risperiDONE (RISPERDAL) 0.5 MG tablet Take 1 tablet (0.5 mg total) by mouth at bedtime.   rosuvastatin (CRESTOR) 40 MG tablet Take 1 tablet (40 mg total) by mouth daily.   solifenacin (VESICARE) 10 MG tablet Take 1 tablet (10 mg total) by mouth daily.   solifenacin (VESICARE) 10 MG tablet Take 1 tablet (10 mg total) by mouth daily.   topiramate (TOPAMAX) 50 MG tablet Take 1 tablet (50 mg total) by mouth daily.   [DISCONTINUED] clobetasol ointment (TEMOVATE) 0.05 % Apply 1 Application topically at bedtime.   sertraline (ZOLOFT) 50 MG tablet Take 3 tablets (150 mg total) by mouth at bedtime. (Patient taking differently: Take 100 mg by mouth at bedtime.)   [DISCONTINUED] rivaroxaban (XARELTO) 20 MG TABS tablet Take 1 tablet (20 mg total) by mouth daily with  supper. Please fill this prescription after the first starter pack has been completed.   No facility-administered encounter medications on file as of 04/11/2023.    Subjective Pt experienced bleeding the past 2 days when she voided Has history of LSA Also with Cloyd Stagers and incontinence, has run out of her vesicare Some pressure Past Medical History:  Diagnosis Date   Arthritis    Chronic back pain    Conversion disorder    caused by stress of daughter's death in 05/04/2010   Depression    GERD (gastroesophageal reflux disease)    Hypercholesterolemia    Hypertension    Kidney stone    stones   Migraine    Panic attacks    daily, worse the 15th of each month   Thyroid disease     Past Surgical History:  Procedure Laterality Date   ABDOMINAL HYSTERECTOMY     r ovary removal   APPENDECTOMY  1970   CHOLECYSTECTOMY  2010-05-04   CYSTOSCOPY  1990   KIDNEY STONE SURGERY  05-04-2009   LAPAROSCOPY  1984   LEFT HEART CATH AND CORONARY ANGIOGRAPHY N/A 12/26/2017   Procedure: LEFT HEART CATH AND CORONARY ANGIOGRAPHY;  Surgeon: Runell Gess, MD;  Location: [facility];  Service: Cardiovascular;  Laterality: N/A;   OVARY SURGERY Left 1980   STERILIZATION  1979   THYROID SURGERY  2010    OB History   No obstetric history on file.     Allergies  Allergen Reactions   Charentais Melon (French Melon) Anaphylaxis   Morphine Swelling    REACTION: swelling   Pineapple Swelling    Throat swelling    Social History   Socioeconomic History   Marital status: Married    Spouse name: Not on file   Number of children: Not on file   Years of education: Not on file   Highest education level: Not on file  Occupational History   Not on file  Tobacco Use   Smoking status: Never   Smokeless tobacco: Never  Vaping Use   Vaping status: Never Used  Substance and Sexual Activity   Alcohol use: No   Drug use: No   Sexual activity: Never    Birth control/protection: None  Other Topics  Concern   Not on file  Social History Narrative   Not on file   Social Determinants of Health   Financial Resource Strain: Not on file  Food Insecurity: Not on file  Transportation Needs: Not on file  Physical Activity: Not on file  Stress: Not on file  Social Connections: Not on file    Family History  Problem Relation Age of Onset   Arthritis Mother    Migraines Mother    Cancer Sister    Cancer Maternal Grandmother     Medications:       Current Outpatient Medications:    amitriptyline (ELAVIL) 50 MG tablet, Take 1 tablet (50 mg total) by mouth at bedtime., Disp: 90 tablet, Rfl: 1   aspirin EC 81 MG tablet, Take 81 mg by mouth daily. Swallow whole., Disp: , Rfl:    clobetasol ointment (TEMOVATE) 0.05 %, Apply to area at bedtime nightly, Disp: 30 g, Rfl: 11   clopidogrel (PLAVIX) 75 MG tablet, Take 75 mg by mouth daily., Disp: , Rfl:    cyanocobalamin (VITAMIN B12) 1000 MCG tablet, Take 1,000 mcg by mouth daily., Disp: , Rfl:    estradiol (ESTRACE) 0.1 MG/GM vaginal cream, Apply a small amount to outside of urethra qhs, Disp: 30 g, Rfl: 12   ezetimibe (ZETIA) 10 MG tablet, Take 1 tablet (10 mg total) by mouth daily., Disp: 90 tablet, Rfl: 3   nitroGLYCERIN (NITROSTAT) 0.4 MG SL tablet, Place 1 tablet (0.4 mg total) under the tongue every 5 (five) minutes x 3 doses as needed for chest pain., Disp: 30 tablet, Rfl: 12   pantoprazole (PROTONIX) 40 MG tablet, Take 1 tablet by mouth once daily, Disp: 90 tablet, Rfl: 0   propranolol (INDERAL) 20 MG tablet, Take 0.5 tablets (10 mg total) by mouth 2 (two) times daily., Disp: 90 tablet, Rfl: 3   risperiDONE (RISPERDAL) 0.5 MG tablet, Take 1 tablet (0.5 mg total) by mouth at bedtime., Disp: 90 tablet, Rfl: 1   rosuvastatin (CRESTOR) 40 MG tablet, Take 1 tablet (40 mg total) by mouth daily., Disp: 90 tablet, Rfl: 3   solifenacin (VESICARE) 10 MG tablet, Take 1 tablet (10 mg total) by mouth daily., Disp: 30 tablet, Rfl: 0   solifenacin  (VESICARE) 10 MG tablet, Take 1 tablet (10 mg total) by mouth daily., Disp: 30 tablet, Rfl: 11   topiramate (TOPAMAX) 50 MG tablet, Take 1 tablet (50 mg total) by mouth daily., Disp: 90 tablet, Rfl: 1   sertraline (ZOLOFT) 50 MG tablet, Take 3 tablets (150 mg total) by mouth at bedtime. (Patient taking differently: Take 100 mg  by mouth at bedtime.), Disp: 270 tablet, Rfl: 1  Objective Blood pressure 139/89, pulse 86.  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 Lichen sclerosus et atrophicus  L90.0    use clobetasol 0.05% qhs    3. 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    Dispense:  30 tablet    Refill:  11    Labs or Scans Ordered during this encounter: No orders of the defined types were placed in this encounter.     Follow up No follow-ups on file.

## 2023-04-11 NOTE — Addendum Note (Signed)
Addended by: Annamarie Dawley on: 04/11/2023 04:08 PM   Modules accepted: Orders

## 2023-04-12 ENCOUNTER — Ambulatory Visit: Payer: Medicare HMO

## 2023-04-13 LAB — URINE CULTURE: Organism ID, Bacteria: NO GROWTH

## 2023-04-15 ENCOUNTER — Ambulatory Visit (HOSPITAL_COMMUNITY): Payer: Medicare HMO | Admitting: Physical Therapy

## 2023-04-15 DIAGNOSIS — M5459 Other low back pain: Secondary | ICD-10-CM | POA: Diagnosis not present

## 2023-04-15 DIAGNOSIS — R262 Difficulty in walking, not elsewhere classified: Secondary | ICD-10-CM

## 2023-04-15 DIAGNOSIS — M545 Low back pain, unspecified: Secondary | ICD-10-CM | POA: Diagnosis not present

## 2023-04-15 DIAGNOSIS — M6281 Muscle weakness (generalized): Secondary | ICD-10-CM | POA: Diagnosis not present

## 2023-04-15 DIAGNOSIS — G8929 Other chronic pain: Secondary | ICD-10-CM | POA: Diagnosis not present

## 2023-04-15 NOTE — Therapy (Signed)
OUTPATIENT PHYSICAL THERAPY THORACOLUMBAR TREATMENT Patient Name: Kendra [name] MRN: [id number] DOB:April 17, 1946, 77 y.o., female Today's Date: 04/15/2023  END OF SESSION:  PT End of Session - 04/15/23 0942     Visit Number 2    Number of Visits 12    Date for PT Re-Evaluation 05/23/23    Authorization Type [employer]  Medicare    Authorization Time Period cohere approved 12 visits 9/19-10/31    Authorization - Visit Number 2    Authorization - Number of Visits 12    Progress Note Due on Visit 10    PT Start Time 0935    PT Stop Time 1015    PT Time Calculation (min) 40 min    Activity Tolerance Patient tolerated treatment well             Past Medical History:  Diagnosis Date   Arthritis    Chronic back pain    Conversion disorder    caused by stress of daughter's death in 07-11-2010   Depression    GERD (gastroesophageal reflux disease)    Hypercholesterolemia    Hypertension    Kidney stone    stones   Migraine    Panic attacks    daily, worse the 15th of each month   Thyroid disease    Past Surgical History:  Procedure Laterality Date   ABDOMINAL HYSTERECTOMY     r ovary removal   APPENDECTOMY  1970   CHOLECYSTECTOMY  11-Jul-2010   CYSTOSCOPY  1990   KIDNEY STONE SURGERY  07/11/09   LAPAROSCOPY  1984   LEFT HEART CATH AND CORONARY ANGIOGRAPHY N/A 12/26/2017   Procedure: LEFT HEART CATH AND CORONARY ANGIOGRAPHY;  Surgeon: Runell Gess, MD;  Location: [facility];  Service: Cardiovascular;  Laterality: N/A;   OVARY SURGERY Left 1980   STERILIZATION  1979   THYROID SURGERY  07/11/09   Patient Active Problem List   Diagnosis Date Noted   Lichen sclerosus of vulva 02/12/2023   Prediabetes 11/08/2021   Chronic migraine without aura 08/10/2021   Chronic low back pain 08/10/2021   Conversion disorder    History of DVT (deep vein thrombosis) 12/26/2017   Essential hypertension 12/25/2017   Mixed hyperlipidemia 12/25/2017   Depression 03/17/2010   OSA on CPAP  10/12/2009   GERD 03/07/2007    PCP: Bradley Ferris  REFERRING PROVIDER: Eldred Manges, MD  REFERRING DIAG: M54.50,G89.29 (ICD-10-CM) - Chronic low back pain, unspecified back pain laterality, unspecified whether sciatica present  Rationale for Evaluation and Treatment: Rehabilitation  THERAPY DIAG:  Low back pain Muscle weakness   ONSET DATE: chronic  SUBJECTIVE STATEMENT: *Husband interprets for patient*  Spouse reports patient is much better since doing the exercises given at last appointment.  No pain currently.  Evaluation: Pt states that she hurts greater on her left than her right.  The pain goes into her leg every day, to her knee area.  She is able to sit for 15 minutes, Stands for 5 minutes, she can walk with a cane but her pain will increase almost immediately.    PERTINENT HISTORY:  Per MD:  77 year old female here with her husband and Spanish interpreter with chronic low back pain history of conversion disorder depression hypertension history of DVT. She has sleep apnea. She has a rolling walker with seat at home has a cane she is using today and mostly at home uses a wheelchair. Attending chronic back pain severe for greater than 5 years. No recent lumbar MRI scan. Previously had Elsner myelogram CT scan cervical spine thoracic spine and lumbar spine also several renal CTs looking for stones which images lumbar spine. More recent significant progressive narrowing at L2-3 and posterior spurring which narrows the AP diameter of the canal at L3-4.   she has significant weakness lower extremity strength time at home in a wheelchair and uses her rolling walker with 5 inch seat but can only walk a few steps she gets weak. She needs some outpatient physical therapy in [place] where she lives. She  states she really does not want to have surgery. Her legs are so weak she needs therapy more than diagnostic imaging at this point. I will recheck her in 2 months.  PAIN:  Are you having pain? Yes: NPRS scale: 8/10, worst pain has been a 9, best 5 Pain location: low back  Pain description: ache and throb Aggravating factors: wt bearing  Relieving factors: rest   PRECAUTIONS: Fall  WEIGHT BEARING RESTRICTIONS: No  FALLS:  Has patient fallen in last 6 months? No  LIVING ENVIRONMENT: Lives with: lives with their spouse Lives in: House/apartment Stairs: No Has following equipment at home: Retail banker - 2 wheeled and 4 wheeled   PLOF: Requires assistive device for independence  PATIENT GOALS: To be able to walk around her house. Decreased pain.   NEXT MD VISIT: November   OBJECTIVE:   PATIENT SURVEYS:  FOTO 30  COGNITION: Overall cognitive status: Within functional limits for tasks assessed      POSTURE: rounded shoulders, forward head, decreased lumbar lordosis, and increased thoracic kyphosis   LUMBAR ROM:   AROM eval  Flexion   Extension 18-reps cause no change   Right lateral flexion   Left lateral flexion   Right rotation   Left rotation    (Blank rows = not tested)  LOWER EXTREMITY ROM:   WFL  LOWER EXTREMITY MMT:    MMT Right eval Left eval  Hip flexion 3 2+  Hip extension 2+ 2+  Hip abduction 3- 3-  Hip adduction    Hip internal rotation    Hip external rotation    Knee flexion 3 3  Knee extension 5 4+  Ankle dorsiflexion 3 3  Ankle plantarflexion    Ankle inversion    Ankle eversion     (Blank rows = not tested)   FUNCTIONAL TESTS:  30 seconds chair stand test: unable to stand without UE support:  With B UE support 8x ; for healthy adults pt age no UE 12 would be average.  2 minute walk test:140 ft  with  small quad cane , antalgic and unsteady gt  Single leg stance: unable    TODAY'S TREATMENT:                                                                                                                               DATE:  04/15/23 Supine:  ab sets 10X5"  Bridge 10X  SLR 10X each  Heelsides 10X each  Lower trunk rotations 10X5" Side lying hip abduction 10X each Seated:  hamstring stretch long sitting 3X30" each  Sit to stand from chair with 2" foam riser, no UE 10X   04/11/23:  evaluation  Supine: bridge x 5 Sitting: abdominal set x 5 Bed mobility     PATIENT EDUCATION:  Education details: HEP Person educated: Patient and Spouse Education method: Explanation, Facilities manager, Actor cues, Verbal cues, and Handouts Education comprehension: returned demonstration  HOME EXERCISE PROGRAM: Access Code: [email or web address] URL: [email or web address]  Date: 04/11/2023 Prepared by: Virgina Organ Exercises - Supine Bridge  - 3 x daily - 7 x weekly - 1 sets - 10 reps - 3seconds hold - Seated Transversus Abdominis Bracing  - 3 x daily - 7 x weekly - 1 sets - 10 reps - 3 seconds  hold  Date: 04/15/2023 Prepared by: Emeline Gins Exercises - Small Range Straight Leg Raise  - 2 x daily - 7 x weekly - 1 sets - 10 reps - Supine Heel Slide  - 2 x daily - 7 x weekly - 1 sets - 10 reps - Supine Lower Trunk Rotation  - 2 x daily - 7 x weekly - 1 sets - 10 reps - 5 sec hold - Sidelying Hip Abduction  - 2 x daily - 7 x weekly - 1 sets - 10 reps - Seated Table Hamstring Stretch  - 2 x daily - 7 x weekly - 1 sets - 3 reps - 30 sec hold - Sit to Stand  - 2 x daily - 7 x weekly - 1 sets - 10 reps   ASSESSMENT: CLINICAL IMPRESSION: Pt comes with spouse today to interpret for her.  Reviewed current HEP and Progressed to include exercises to improve LE strength and functional impairments.  Noted difficutly with heelslides on LT initially but able to complete much easier following repeated repetitions.  Unable to complete standing from chair without UE assist unless given 2 inch lift to begin in chair.  Pt  without complaints of pain during or at end of session today.  Pt will continue to benefit from skilled PT to address her deficits and maximize her functioning ability.   OBJECTIVE IMPAIRMENTS: decreased activity tolerance, decreased balance, decreased mobility, difficulty walking, decreased ROM, decreased strength, and pain.   ACTIVITY LIMITATIONS: carrying, lifting, standing, squatting, stairs, transfers, bed mobility, dressing, and locomotion level  PARTICIPATION LIMITATIONS: meal prep, cleaning, laundry, shopping, and community activity  PERSONAL FACTORS: Fitness and Time since onset of injury/illness/exacerbation are also affecting patient's functional outcome.  REHAB POTENTIAL: Good  CLINICAL DECISION MAKING: Evolving/moderate complexity  EVALUATION COMPLEXITY: Moderate   GOALS: Goals reviewed with patient? No  SHORT TERM GOALS: Target date: 05/02/23  Pt to be I in HEp in order to decrease her pain to no greater than a 5/10  Baseline: Goal status: INITIAL  2.  PT strength to be increased 1/2 grade to allow pt to be able to walk for 10 minutes without increased pain  Baseline:  Goal status: INITIAL  3.  Pt to be able to come sit to stand from a chair without UE assist.  Baseline:  Goal status: INITIAL  4.  Pt to be using good bed body mechanics  Baseline:  Goal status: INITIAL   LONG TERM GOALS: Target date: 05/23/23  Pt to be I in HEp in an advanced HEP order to decrease her pain to no greater than a 3/10  Baseline:  Goal status: INITIAL  2.  PT strength to be increased 1 grade to allow pt to be able to walk for 20 minutes without increased pain  Baseline:  Goal status: INITIAL  3.  PT to be able to come sit to stand from a couch without UE assist  Baseline:  Goal status: INITIAL  4.  PT to be able to single leg stance for 10 seconds on each leg to decrease risk of falling  Baseline:  Goal status: INITIAL   PLAN:  PT FREQUENCY: 2x/week  PT  DURATION: 6 weeks  PLANNED INTERVENTIONS: Therapeutic exercises, Balance training, Gait training, Patient/Family education, Self Care, and Manual therapy.  PLAN FOR NEXT SESSION: continue with LE strengthening. Education on Estate manager/land agent.   Lurena Nida, PTA/CLT [facility] Ph: [phone]  04/15/2023, 9:43 AM

## 2023-04-19 ENCOUNTER — Encounter (HOSPITAL_COMMUNITY): Payer: Medicare HMO

## 2023-04-24 ENCOUNTER — Encounter (HOSPITAL_COMMUNITY): Payer: Self-pay

## 2023-04-24 ENCOUNTER — Ambulatory Visit (HOSPITAL_COMMUNITY): Payer: Medicare HMO | Attending: Orthopaedic Surgery

## 2023-04-24 DIAGNOSIS — M6281 Muscle weakness (generalized): Secondary | ICD-10-CM | POA: Insufficient documentation

## 2023-04-24 DIAGNOSIS — M5459 Other low back pain: Secondary | ICD-10-CM | POA: Insufficient documentation

## 2023-04-24 DIAGNOSIS — R262 Difficulty in walking, not elsewhere classified: Secondary | ICD-10-CM | POA: Insufficient documentation

## 2023-04-24 NOTE — Therapy (Signed)
[facility] Outpatient Rehabilitation at [facility] [address] [place], [place], [address] Phone: [phone]   Fax:  [phone]  Patient Details  Name: Kendra [name] MRN: [id number] Date of Birth: 1946/07/02 Referring Provider:  Tommie Sams, DO    04/24/23 1529  PT Visits / Re-Eval  Visit Number 2  Number of Visits 12  Date for PT Re-Evaluation 05/23/23  Authorization  Authorization Type [employer]  Medicare  Authorization Time Period cohere approved 12 visits 9/19-10/31  Authorization - Visit Number 2  Authorization - Number of Visits 12  Progress Note Due on Visit 10  PT Time Calculation  PT Start Time 1507  PT Stop Time 1517  PT Time Calculation (min) 10 min  PT - End of Session  Activity Tolerance  (Held session due to vitals, c/o headache and chest pain, encouraged to seek medical attention)   Encounter Date: 04/24/2023 Vitals:  BP: 142/104 mmHg.  Pulse at 87 bmp.    Held session due to vitals following report of chest pain and headache following.  Encouraged to seek medical attention if continues.  Juel Burrow, [place] 04/24/2023, 4:21 PM  [facility] Outpatient Rehabilitation at [facility] [address] [place], [place], [address] Phone: [phone]   Fax:  [phone]

## 2023-04-26 ENCOUNTER — Ambulatory Visit (HOSPITAL_COMMUNITY): Payer: Medicare HMO

## 2023-04-26 DIAGNOSIS — M6281 Muscle weakness (generalized): Secondary | ICD-10-CM

## 2023-04-26 DIAGNOSIS — M5459 Other low back pain: Secondary | ICD-10-CM | POA: Diagnosis not present

## 2023-04-26 DIAGNOSIS — R262 Difficulty in walking, not elsewhere classified: Secondary | ICD-10-CM | POA: Diagnosis not present

## 2023-04-26 NOTE — Therapy (Signed)
OUTPATIENT PHYSICAL THERAPY THORACOLUMBAR TREATMENT Patient Name: Kendra [name] MRN: [id number] DOB:14-May-1946, 77 y.o., female Today's Date: 04/26/2023  END OF SESSION:  PT End of Session - 04/26/23 1241     Visit Number 3    Number of Visits 12    Date for PT Re-Evaluation 05/23/23    Authorization Type [employer]  Medicare    Authorization Time Period cohere approved 12 visits 9/19-10/31    Authorization - Visit Number 3    Authorization - Number of Visits 12    Progress Note Due on Visit 10    PT Start Time 1100    PT Stop Time 1145    PT Time Calculation (min) 45 min    Activity Tolerance Patient limited by fatigue    Behavior During Therapy [facility] for tasks assessed/performed            Past Medical History:  Diagnosis Date   Arthritis    Chronic back pain    Conversion disorder    caused by stress of daughter's death in 06-03-2010   Depression    GERD (gastroesophageal reflux disease)    Hypercholesterolemia    Hypertension    Kidney stone    stones   Migraine    Panic attacks    daily, worse the 15th of each month   Thyroid disease    Past Surgical History:  Procedure Laterality Date   ABDOMINAL HYSTERECTOMY     r ovary removal   APPENDECTOMY  1970   CHOLECYSTECTOMY  Jun 03, 2010   CYSTOSCOPY  1990   KIDNEY STONE SURGERY  2009-06-03   LAPAROSCOPY  1984   LEFT HEART CATH AND CORONARY ANGIOGRAPHY N/A 12/26/2017   Procedure: LEFT HEART CATH AND CORONARY ANGIOGRAPHY;  Surgeon: Runell Gess, MD;  Location: [facility];  Service: Cardiovascular;  Laterality: N/A;   OVARY SURGERY Left 1980   STERILIZATION  1979   THYROID SURGERY  06-03-09   Patient Active Problem List   Diagnosis Date Noted   Lichen sclerosus of vulva 02/12/2023   Prediabetes 11/08/2021   Chronic migraine without aura 08/10/2021   Chronic low back pain 08/10/2021   Conversion disorder    History of DVT (deep vein thrombosis) 12/26/2017   Essential hypertension 12/25/2017   Mixed hyperlipidemia  12/25/2017   Depression 03/17/2010   OSA on CPAP 10/12/2009   GERD 03/07/2007    PCP: Bradley Ferris  REFERRING PROVIDER: Eldred Manges, MD  REFERRING DIAG: M54.50,G89.29 (ICD-10-CM) - Chronic low back pain, unspecified back pain laterality, unspecified whether sciatica present  Rationale for Evaluation and Treatment: Rehabilitation  THERAPY DIAG:  Low back pain Muscle weakness   ONSET DATE: chronic  SUBJECTIVE STATEMENT: Patient reports of pain localized on the left side of the back = 6/10 but can shoot down to the L LE. Patient states that all the bones in her back are affected. Patient cannot bend over to pick up objects on the floor.  Evaluation: Pt states that she hurts greater on her left than her right.  The pain goes into her leg every day, to her knee area.  She is able to sit for 15 minutes, Stands for 5 minutes, she can walk with a cane but her pain will increase almost immediately.    PERTINENT HISTORY:  Per MD:  77 year old female here with her husband and Spanish interpreter with chronic low back pain history of conversion disorder depression hypertension history of DVT. She has sleep apnea. She has a rolling walker with seat at home has a cane she is using today and mostly at home uses a wheelchair. Attending chronic back pain severe for greater than 5 years. No recent lumbar MRI scan. Previously had Elsner myelogram CT scan cervical spine thoracic spine and lumbar spine also several renal CTs looking for stones which images lumbar spine. More recent significant progressive narrowing at L2-3 and posterior spurring which narrows the AP diameter of the canal at L3-4.   she has significant weakness lower extremity strength time at home in a wheelchair and uses her rolling walker with 5 inch seat  but can only walk a few steps she gets weak. She needs some outpatient physical therapy in [place] where she lives. She states she really does not want to have surgery. Her legs are so weak she needs therapy more than diagnostic imaging at this point. I will recheck her in 2 months.  PAIN:  Are you having pain? Yes: NPRS scale: 8/10, worst pain has been a 9, best 5 Pain location: low back  Pain description: ache and throb Aggravating factors: wt bearing  Relieving factors: rest   PRECAUTIONS: Fall  WEIGHT BEARING RESTRICTIONS: No  FALLS:  Has patient fallen in last 6 months? No  LIVING ENVIRONMENT: Lives with: lives with their spouse Lives in: House/apartment Stairs: No Has following equipment at home: Retail banker - 2 wheeled and 4 wheeled   PLOF: Requires assistive device for independence  PATIENT GOALS: To be able to walk around her house. Decreased pain.   NEXT MD VISIT: November   OBJECTIVE:   PATIENT SURVEYS:  FOTO 30  COGNITION: Overall cognitive status: Within functional limits for tasks assessed      POSTURE: rounded shoulders, forward head, decreased lumbar lordosis, and increased thoracic kyphosis   LUMBAR ROM:   AROM eval  Flexion   Extension 18-reps cause no change   Right lateral flexion   Left lateral flexion   Right rotation   Left rotation    (Blank rows = not tested)  LOWER EXTREMITY ROM:   WFL  LOWER EXTREMITY MMT:    MMT Right eval Left eval  Hip flexion 3 2+  Hip extension 2+ 2+  Hip abduction 3- 3-  Hip adduction    Hip internal rotation    Hip external rotation    Knee flexion 3 3  Knee extension 5 4+  Ankle dorsiflexion 3 3  Ankle plantarflexion    Ankle inversion    Ankle eversion     (Blank rows = not tested)   FUNCTIONAL TESTS:  30 seconds chair stand test: unable to stand without UE support:  With B UE support  8x ; for healthy adults pt age no UE 12 would be average.  2 minute walk test:140 ft   with small quad cane , antalgic and unsteady gt  Single leg stance: unable    TODAY'S TREATMENT:                                                                                                                              DATE:  04/26/23 Recumbent stepper, seat 9, arm 7, level 1 x 5' Supine:  Hooklying hip abd isometrics with a belt x 3" x 10  Hooklying hip adduction squeeze x 3" x 10 Seated hamstring stretch x 30" x 2 Vital signs monitoring  04/15/23 Supine:  ab sets 10X5"  Bridge 10X  SLR 10X each  Heelsides 10X each  Lower trunk rotations 10X5" Side lying hip abduction 10X each Seated:  hamstring stretch long sitting 3X30" each  Sit to stand from chair with 2" foam riser, no UE 10X   04/11/23:  evaluation  Supine: bridge x 5 Sitting: abdominal set x 5 Bed mobility     PATIENT EDUCATION:  Education details: HEP Person educated: Patient and Spouse Education method: Explanation, Facilities manager, Actor cues, Verbal cues, and Handouts Education comprehension: returned demonstration  HOME EXERCISE PROGRAM: Access Code: [email or web address] URL: [email or web address]  Date: 04/11/2023 Prepared by: Virgina Organ Exercises - Supine Bridge  - 3 x daily - 7 x weekly - 1 sets - 10 reps - 3seconds hold - Seated Transversus Abdominis Bracing  - 3 x daily - 7 x weekly - 1 sets - 10 reps - 3 seconds  hold  Date: 04/15/2023 Prepared by: Emeline Gins Exercises - Small Range Straight Leg Raise  - 2 x daily - 7 x weekly - 1 sets - 10 reps - Supine Heel Slide  - 2 x daily - 7 x weekly - 1 sets - 10 reps - Supine Lower Trunk Rotation  - 2 x daily - 7 x weekly - 1 sets - 10 reps - 5 sec hold - Sidelying Hip Abduction  - 2 x daily - 7 x weekly - 1 sets - 10 reps - Seated Table Hamstring Stretch  - 2 x daily - 7 x weekly - 1 sets - 3 reps - 30 sec hold - Sit to Stand  - 2 x daily - 7 x weekly - 1 sets - 10 reps   ASSESSMENT: CLINICAL IMPRESSION: Interventions today were geared  towards Hip strengthening and flexibility. Initial BP = 118/73 with HR = 81. Patient was then started with bike but patient was unable to pedal well so patient was put on a stepper. After working on the stepper, patient's BP dropped to 94/58 with HR = 77. Patient also reported of "air/pressure" on the ears. Patient was then laid down. BP then went up to 108/70 with HR = 73 and exercise was resumed. Tolerated all activities without worsening of symptoms except when doing LTR where pain  on the L side of the back/hips rose to 8/10 so exercise was terminated. Pacing of activities was slow today. Demonstrated mild levels of fatigue. Rest periods provided. Provided mild amount of cueing to ensure correct execution of activity with good carry-over. After the exercises, patient's BP was 102/69 with HR = 75. Patient was given more time to rest and BP at the end of the session = 104/68 with HR = 73. To date, skilled PT is required to address the impairments and improve function.   OBJECTIVE IMPAIRMENTS: decreased activity tolerance, decreased balance, decreased mobility, difficulty walking, decreased ROM, decreased strength, and pain.   ACTIVITY LIMITATIONS: carrying, lifting, standing, squatting, stairs, transfers, bed mobility, dressing, and locomotion level  PARTICIPATION LIMITATIONS: meal prep, cleaning, laundry, shopping, and community activity  PERSONAL FACTORS: Fitness and Time since onset of injury/illness/exacerbation are also affecting patient's functional outcome.   REHAB POTENTIAL: Good  CLINICAL DECISION MAKING: Evolving/moderate complexity  EVALUATION COMPLEXITY: Moderate   GOALS: Goals reviewed with patient? No  SHORT TERM GOALS: Target date: 05/02/23  Pt to be I in HEp in order to decrease her pain to no greater than a 5/10  Baseline: Goal status: INITIAL  2.  PT strength to be increased 1/2 grade to allow pt to be able to walk for 10 minutes without increased pain  Baseline:  Goal  status: INITIAL  3.  Pt to be able to come sit to stand from a chair without UE assist.  Baseline:  Goal status: INITIAL  4.  Pt to be using good bed body mechanics  Baseline:  Goal status: INITIAL   LONG TERM GOALS: Target date: 05/23/23  Pt to be I in HEp in an advanced HEP order to decrease her pain to no greater than a 3/10  Baseline:  Goal status: INITIAL  2.  PT strength to be increased 1 grade to allow pt to be able to walk for 20 minutes without increased pain  Baseline:  Goal status: INITIAL  3.  PT to be able to come sit to stand from a couch without UE assist  Baseline:  Goal status: INITIAL  4.  PT to be able to single leg stance for 10 seconds on each leg to decrease risk of falling  Baseline:  Goal status: INITIAL   PLAN:  PT FREQUENCY: 2x/week  PT DURATION: 6 weeks  PLANNED INTERVENTIONS: Therapeutic exercises, Balance training, Gait training, Patient/Family education, Self Care, and Manual therapy.  PLAN FOR NEXT SESSION: Continue POC and may progress as tolerated with emphasis on  LE strengthening. Education on Estate manager/land agent.   Tish Frederickson. [place]unetta Riggle, PT, DPT, OCS Board-Certified Clinical Specialist in Orthopedic PT PT Compact Privilege # ([place]): [id number]   04/26/2023, 12:42 PM

## 2023-05-01 ENCOUNTER — Encounter (HOSPITAL_COMMUNITY): Payer: Self-pay

## 2023-05-01 ENCOUNTER — Ambulatory Visit (HOSPITAL_COMMUNITY): Payer: Medicare HMO

## 2023-05-01 DIAGNOSIS — R262 Difficulty in walking, not elsewhere classified: Secondary | ICD-10-CM | POA: Diagnosis not present

## 2023-05-01 DIAGNOSIS — M5459 Other low back pain: Secondary | ICD-10-CM

## 2023-05-01 DIAGNOSIS — M6281 Muscle weakness (generalized): Secondary | ICD-10-CM | POA: Diagnosis not present

## 2023-05-01 NOTE — Therapy (Addendum)
OUTPATIENT PHYSICAL THERAPY THORACOLUMBAR TREATMENT/Discharge Patient Name: Kendra [name] MRN: [id number] DOB:04-08-1946, 77 y.o., female Today's Date: 05/01/2023 PHYSICAL THERAPY DISCHARGE SUMMARY  Visits from Start of Care: 4  Current functional level related to goals / functional outcomes: Not met   Remaining deficits: Back pain/low BP   Education / Equipment: HEP   Patient agrees to discharge. Patient goals were not met. Patient is being discharged due to a change in medical status.  END OF SESSION:  PT End of Session - 05/01/23 1113     Visit Number 4    Number of Visits 12    Date for PT Re-Evaluation 05/23/23    Authorization Type [employer]  Medicare    Authorization Time Period cohere approved 12 visits 9/19-10/31    Authorization - Visit Number 4    Authorization - Number of Visits 12    Progress Note Due on Visit 10    PT Start Time 1036    PT Stop Time 1100    PT Time Calculation (min) 24 min    Equipment Utilized During Treatment Gait belt    Activity Tolerance --   Limited by vitals, held session and encouraged to go to ER   Behavior During Therapy [facility] for tasks assessed/performed   c/o dizziness, headache and feeling weak            Past Medical History:  Diagnosis Date   Arthritis    Chronic back pain    Conversion disorder    caused by stress of daughter's death in 2010-06-05   Depression    GERD (gastroesophageal reflux disease)    Hypercholesterolemia    Hypertension    Kidney stone    stones   Migraine    Panic attacks    daily, worse the 15th of each month   Thyroid disease    Past Surgical History:  Procedure Laterality Date   ABDOMINAL HYSTERECTOMY     r ovary removal   APPENDECTOMY  1970   CHOLECYSTECTOMY  06-05-2010   CYSTOSCOPY  1990   KIDNEY STONE SURGERY  06/05/09   LAPAROSCOPY  1984   LEFT HEART CATH AND CORONARY ANGIOGRAPHY N/A 12/26/2017   Procedure: LEFT HEART CATH AND CORONARY ANGIOGRAPHY;  Surgeon: Runell Gess, MD;  Location:  [facility];  Service: Cardiovascular;  Laterality: N/A;   OVARY SURGERY Left 1980   STERILIZATION  1979   THYROID SURGERY  06/05/09   Patient Active Problem List   Diagnosis Date Noted   Lichen sclerosus of vulva 02/12/2023   Prediabetes 11/08/2021   Chronic migraine without aura 08/10/2021   Chronic low back pain 08/10/2021   Conversion disorder    History of DVT (deep vein thrombosis) 12/26/2017   Essential hypertension 12/25/2017   Mixed hyperlipidemia 12/25/2017   Depression 03/17/2010   OSA on CPAP 10/12/2009   GERD 03/07/2007    PCP: Bradley Ferris  REFERRING PROVIDER: Eldred Manges, MD  REFERRING DIAG: M54.50,G89.29 (ICD-10-CM) - Chronic low back pain, unspecified back pain laterality, unspecified whether sciatica present  Rationale for Evaluation and Treatment: Rehabilitation  THERAPY DIAG:  Low back pain Muscle weakness   ONSET DATE: chronic  SUBJECTIVE STATEMENT: Reports she is feeling better today.  Pt stated she has pain only on left side of upper and lower back today, pain scale 3/10.  Pt with interprester Lillia Mountain  Evaluation: Pt states that she hurts greater on her left than her right.  The pain goes into her leg every day, to her knee area.  She is able to sit for 15 minutes, Stands for 5 minutes, she can walk with a cane but her pain will increase almost immediately.    PERTINENT HISTORY:  Per MD:  77 year old female here with her husband and Spanish interpreter with chronic low back pain history of conversion disorder depression hypertension history of DVT. She has sleep apnea. She has a rolling walker with seat at home has a cane she is using today and mostly at home uses a wheelchair. Attending chronic back pain severe for greater than 5 years. No recent lumbar MRI  scan. Previously had [place]sner myelogram CT scan cervical spine thoracic spine and lumbar spine also several renal CTs looking for stones which images lumbar spine. More recent significant progressive narrowing at L2-3 and posterior spurring which narrows the AP diameter of the canal at L3-4.   she has significant weakness lower extremity strength time at home in a wheelchair and uses her rolling walker with 5 inch seat but can only walk a few steps she gets weak. She needs some outpatient physical therapy in [place] where she lives. She states she really does not want to have surgery. Her legs are so weak she needs therapy more than diagnostic imaging at this point. I will recheck her in 2 months.  PAIN:  Are you having pain? Yes: NPRS scale: 8/10, worst pain has been a 9, best 5 Pain location: low back  Pain description: ache and throb Aggravating factors: wt bearing  Relieving factors: rest   PRECAUTIONS: Fall  WEIGHT BEARING RESTRICTIONS: No  FALLS:  Has patient fallen in last 6 months? No  LIVING ENVIRONMENT: Lives with: lives with their spouse Lives in: House/apartment Stairs: No Has following equipment at home: Retail banker - 2 wheeled and 4 wheeled   PLOF: Requires assistive device for independence  PATIENT GOALS: To be able to walk around her house. Decreased pain.   NEXT MD VISIT: November   OBJECTIVE:   PATIENT SURVEYS:  FOTO 30  COGNITION: Overall cognitive status: Within functional limits for tasks assessed      POSTURE: rounded shoulders, forward head, decreased lumbar lordosis, and increased thoracic kyphosis   LUMBAR ROM:   AROM eval  Flexion   Extension 18-reps cause no change   Right lateral flexion   Left lateral flexion   Right rotation   Left rotation    (Blank rows = not tested)  LOWER EXTREMITY ROM:   WFL  LOWER EXTREMITY MMT:    MMT Right eval Left eval  Hip flexion 3 2+  Hip extension 2+ 2+  Hip abduction 3- 3-   Hip adduction    Hip internal rotation    Hip external rotation    Knee flexion 3 3  Knee extension 5 4+  Ankle dorsiflexion 3 3  Ankle plantarflexion    Ankle inversion    Ankle eversion     (Blank rows = not tested)   FUNCTIONAL TESTS:  30 seconds chair stand test: unable to stand without UE support:  With B UE support 8x ; for healthy adults pt age no UE 12 would be  average.  2 minute walk test:140 ft  with small quad cane , antalgic and unsteady gt  Single leg stance: unable    TODAY'S TREATMENT:                                                                                                                              DATE: 05/01/23: Vitals at beginning of session: seated position 94/68 mmHg, pulse at 84.  Pt stated she checked it this morning and BP was good 10 STS controlled descent- reports of some dizziness that resolved in 45" Standing BP 78/59 mmHtg pulse at 95 Reports left side of head feels "upset" and reports she feels weak Advised to go to ER Escorted to car in wheel chair    04/26/23 Recumbent stepper, seat 9, arm 7, level 1 x 5' Supine:  Hooklying hip abd isometrics with a belt x 3" x 10  Hooklying hip adduction squeeze x 3" x 10 Seated hamstring stretch x 30" x 2 Vital signs monitoring  04/15/23 Supine:  ab sets 10X5"  Bridge 10X  SLR 10X each  Heelsides 10X each  Lower trunk rotations 10X5" Side lying hip abduction 10X each Seated:  hamstring stretch long sitting 3X30" each  Sit to stand from chair with 2" foam riser, no UE 10X   04/11/23:  evaluation  Supine: bridge x 5 Sitting: abdominal set x 5 Bed mobility     PATIENT EDUCATION:  Education details: HEP Person educated: Patient and Spouse Education method: Explanation, Facilities manager, Actor cues, Verbal cues, and Handouts Education comprehension: returned demonstration  HOME EXERCISE PROGRAM: Access Code: [email or web address] URL: [email or web address]  Date: 04/11/2023 Prepared  by: Virgina Organ Exercises - Supine Bridge  - 3 x daily - 7 x weekly - 1 sets - 10 reps - 3seconds hold - Seated Transversus Abdominis Bracing  - 3 x daily - 7 x weekly - 1 sets - 10 reps - 3 seconds  hold  Date: 04/15/2023 Prepared by: Emeline Gins Exercises - Small Range Straight Leg Raise  - 2 x daily - 7 x weekly - 1 sets - 10 reps - Supine Heel Slide  - 2 x daily - 7 x weekly - 1 sets - 10 reps - Supine Lower Trunk Rotation  - 2 x daily - 7 x weekly - 1 sets - 10 reps - 5 sec hold - Sidelying Hip Abduction  - 2 x daily - 7 x weekly - 1 sets - 10 reps - Seated Table Hamstring Stretch  - 2 x daily - 7 x weekly - 1 sets - 3 reps - 30 sec hold - Sit to Stand  - 2 x daily - 7 x weekly - 1 sets - 10 reps   ASSESSMENT: CLINICAL IMPRESSION: Began session checking vitals in seated.  Reports of dizziness following sit to stand so checked BP in standing once dizziness resolved.  BP dropped to 78/59 mmHg and reports of headache.  Pt advised  to go to ER due to symptoms.  Pt with posterior LOB upon standing to transfer, max A required for safety.  Pt escorted to car in wheelchair. Husband explained symptoms.  Pt's vitals has limited PT activities.  Following discussion with evaluation PT decision made to DC and encouraged pt to get new referral when vitals are more stable.  Called and spoke to husband who agrees.   OBJECTIVE IMPAIRMENTS: decreased activity tolerance, decreased balance, decreased mobility, difficulty walking, decreased ROM, decreased strength, and pain.   ACTIVITY LIMITATIONS: carrying, lifting, standing, squatting, stairs, transfers, bed mobility, dressing, and locomotion level  PARTICIPATION LIMITATIONS: meal prep, cleaning, laundry, shopping, and community activity  PERSONAL FACTORS: Fitness and Time since onset of injury/illness/exacerbation are also affecting patient's functional outcome.   REHAB POTENTIAL: Good  CLINICAL DECISION MAKING: Evolving/moderate  complexity  EVALUATION COMPLEXITY: Moderate   GOALS: Goals reviewed with patient? No  SHORT TERM GOALS: Target date: 05/02/23  Pt to be I in HEp in order to decrease her pain to no greater than a 5/10  Baseline:  Goal status: INITIAL  2.  PT strength to be increased 1/2 grade to allow pt to be able to walk for 10 minutes without increased pain  Baseline:  Goal status: INITIAL  3.  Pt to be able to come sit to stand from a chair without UE assist.  Baseline:  Goal status: INITIAL  4.  Pt to be using good bed body mechanics  Baseline:  Goal status: INITIAL   LONG TERM GOALS: Target date: 05/23/23  Pt to be I in HEp in an advanced HEP order to decrease her pain to no greater than a 3/10  Baseline:  Goal status: INITIAL  2.  PT strength to be increased 1 grade to allow pt to be able to walk for 20 minutes without increased pain  Baseline:  Goal status: INITIAL  3.  PT to be able to come sit to stand from a couch without UE assist  Baseline:  Goal status: INITIAL  4.  PT to be able to single leg stance for 10 seconds on each leg to decrease risk of falling  Baseline:  Goal status: INITIAL   PLAN:  PT FREQUENCY: 2x/week  PT DURATION: 6 weeks  PLANNED INTERVENTIONS: Therapeutic exercises, Balance training, Gait training, Patient/Family education, Self Care, and Manual therapy.  PLAN FOR NEXT SESSION: DC to HEP until vitals are stable.  Encouraged pt to contact MD for new referral if wishes to return.  Becky Sax, LPTA/CLT; CBIS [phone] Virgina Organ, PT CLT [phone]  05/01/2023, 12:25 PM

## 2023-05-03 ENCOUNTER — Ambulatory Visit: Payer: Medicare HMO | Attending: Student | Admitting: Student

## 2023-05-03 ENCOUNTER — Encounter: Payer: Self-pay | Admitting: Student

## 2023-05-03 NOTE — Progress Notes (Deleted)
Cardiology Office Note    Date:  05/03/2023  ID:  Kendra [name], DOB Jul 05, 1946, MRN [id number] Cardiologist: Kendra Rich, MD    History of Present Illness:    Kendra [name] is a 77 y.o. female with history of chest pain (cath in 2019 showed only 30% prox-LAD stenosis, no ischemia by NST's in 10/2020 and 02/2022), palpitations, HTN, HLD, migraine headaches and prior TIA who presents to the office today for follow-up.   She was last examined by Kendra Reedy, PA-C in 02/2022 and reported episodes of a deep, squeezing chest pain which would last for a few seconds to minutes.  Recent echocardiogram had shown a low normal EF of 50 to 55% and abnormal septal motion with inferior basal hypokinesis.  Given her symptoms, a Lexiscan Myoview was recommended for further assessment with the consideration of adding Imdur in the future pending results.  Her NST showed no evidence of ischemia or infarction. She did have follow-up visits in 08/2022 in 02/2023 but canceled these appointments.  - repeat FLP  ROS: ***  Studies Reviewed:   EKG: EKG is*** ordered today and demonstrates ***   EKG Interpretation Date/Time:    Ventricular Rate:    PR Interval:    QRS Duration:    QT Interval:    QTC Calculation:   R Axis:      Text Interpretation:         Cardiac Catheterization: 12/2017 Prox LAD lesion is 30% stenosed.  IMPRESSION: [name] has essentially normal coronary arteries and normal filling pressures.  I believe her chest pain is noncardiac.  The sheath was removed and a TR band was placed on the right wrist to achieve patent hemostasis.  She stable to undergo systemic any coagulation with a novel oral anticoagulant given her DVT and can be discharged home either today or tomorrow by the tried hospitalist service.   Echocardiogram: 01/2022 IMPRESSIONS     1. Abnormal septal motion inferior basal hypokinesis . Left ventricular  ejection fraction, by estimation, is 50 to 55%.  The left ventricle has low  normal function. The left ventricle has no regional wall motion  abnormalities. The left ventricular  internal cavity size was mildly dilated. Left ventricular diastolic  parameters are consistent with Grade I diastolic dysfunction (impaired  relaxation).   2. Right ventricular systolic function is normal. The right ventricular  size is normal. There is normal pulmonary artery systolic pressure.   3. The mitral valve is normal in structure. No evidence of mitral valve  regurgitation. No evidence of mitral stenosis.   4. The aortic valve is tricuspid. There is mild calcification of the  aortic valve. There is mild thickening of the aortic valve. Aortic valve  regurgitation is not visualized. Aortic valve sclerosis is present, with  no evidence of aortic valve stenosis.   5. The inferior vena cava is normal in size with greater than 50%  respiratory variability, suggesting right atrial pressure of 3 mmHg.   NST: 02/2022   The study is intermediate risk based upon reduced ejection fraction of 46%.   No ST deviation was noted.   LV perfusion is normal.  There is significant bowel loop uptake inhibiting inferior wall read accuracy seen at both rest and stress There is no evidence of ischemia. There is no evidence of infarction.   Left ventricular function is abnormal.  Ejection fraction 46% global function is mildly reduced. End diastolic cavity size is normal. End systolic cavity size is normal.  Risk Assessment/Calculations:   {Does this patient have ATRIAL FIBRILLATION?[phone]} No BP recorded.  {Refresh Note OR Click here to enter BP  :1}***         Physical Exam:   VS:  There were no vitals taken for this visit.   Wt Readings from Last 3 Encounters:  03/27/23 203 lb (92.1 kg)  02/12/23 202 lb (91.6 kg)  06/01/22 204 lb (92.5 kg)     GEN: Well nourished, well developed in no acute distress NECK: No JVD; No carotid bruits CARDIAC: ***RRR, no  murmurs, rubs, gallops RESPIRATORY:  Clear to auscultation without rales, wheezing or rhonchi  ABDOMEN: Appears non-distended. No obvious abdominal masses. EXTREMITIES: No clubbing or cyanosis. No edema.  Distal pedal pulses are 2+ bilaterally.   Assessment and Plan:   1. History of Chest Pain - Prior cardiac catheterization in 2019 showed 30% prox-LAD stenosis. She had no ischemia by prior NST's in 10/2020 and 02/2022. ***  2. Palpitations - ***  3. HTN - ***  4. HLD - Followed by her PCP.  FLP in 01/2023 showed total cholesterol 370 with triglycerides 155 and LDL 275.  She had been off Crestor and Zetia and was restarted on Crestor 40 mg daily and Zetia 10 mg daily.***   Signed, Kendra Lennox, PA-C

## 2023-05-06 ENCOUNTER — Other Ambulatory Visit: Payer: Self-pay | Admitting: Family Medicine

## 2023-05-06 ENCOUNTER — Telehealth: Payer: Self-pay

## 2023-05-06 ENCOUNTER — Encounter: Payer: Medicare HMO | Admitting: Obstetrics & Gynecology

## 2023-05-06 MED ORDER — SERTRALINE HCL 50 MG PO TABS: 150.0000 mg | ORAL_TABLET | Freq: Every day | ORAL | 1 refills | Status: DC

## 2023-05-06 NOTE — Telephone Encounter (Signed)
Prescription Request  05/06/2023  LOV: Visit date not found  What is the name of the medication or equipment? sertraline (ZOLOFT) 50 MG tablet (Expired)   Have you contacted your pharmacy to request a refill? Yes   Which pharmacy would you like this sent to?  [facility] [address] [place] - [address] [place] [address] Phone: [phone] Fax: [phone]    Patient notified that their request is being sent to the clinical staff for review and that they should receive a response within 2 business days.   Please advise at Mobile [phone] (mobile)

## 2023-05-07 ENCOUNTER — Encounter (HOSPITAL_COMMUNITY): Payer: Medicare HMO | Admitting: Physical Therapy

## 2023-05-09 ENCOUNTER — Encounter (HOSPITAL_COMMUNITY): Payer: Medicare HMO | Admitting: Physical Therapy

## 2023-05-14 ENCOUNTER — Encounter (HOSPITAL_COMMUNITY): Payer: Medicare HMO

## 2023-05-15 DIAGNOSIS — H5213 Myopia, bilateral: Secondary | ICD-10-CM | POA: Diagnosis not present

## 2023-05-16 ENCOUNTER — Encounter (HOSPITAL_COMMUNITY): Payer: Medicare HMO | Admitting: Physical Therapy

## 2023-05-17 ENCOUNTER — Encounter: Payer: Self-pay | Admitting: Cardiology

## 2023-05-17 ENCOUNTER — Ambulatory Visit: Payer: Medicare HMO

## 2023-05-17 ENCOUNTER — Ambulatory Visit: Payer: Medicare HMO | Attending: Cardiology | Admitting: Cardiology

## 2023-05-17 VITALS — BP 112/68 | HR 84 | Ht 63.0 in | Wt 203.0 lb

## 2023-05-17 DIAGNOSIS — R0989 Other specified symptoms and signs involving the circulatory and respiratory systems: Secondary | ICD-10-CM | POA: Diagnosis not present

## 2023-05-17 NOTE — Patient Instructions (Addendum)
Medication Instructions:  Your physician recommends that you continue on your current medications as directed. Please refer to the Current Medication list given to you today.  *If you need a refill on your cardiac medications before your next appointment, please call your pharmacy*   Lab Work: None If you have labs (blood work) drawn today and your tests are completely normal, you will receive your results only by: MyChart Message (if you have MyChart) OR A paper copy in the mail If you have any lab test that is abnormal or we need to change your treatment, we will call you to review the results.   Testing/Procedures: None   Follow-Up: At [facility], you and your health needs are our priority.  As part of our continuing mission to provide you with exceptional heart care, we have created designated Provider Care Teams.  These Care Teams include your primary Cardiologist (physician) and Advanced Practice Providers (APPs -  Physician Assistants and Nurse Practitioners) who all work together to provide you with the care you need, when you need it.  We recommend signing up for the patient portal called "MyChart".  Sign up information is provided on this After Visit Summary.  MyChart is used to connect with patients for Virtual Visits (Telemedicine).  Patients are able to view lab/test results, encounter notes, upcoming appointments, etc.  Non-urgent messages can be sent to your provider as well.   To learn more about what you can do with MyChart, go to [email or web address].    Your next appointment:   4 week(s)  Provider:   You will see one of the following Advanced Practice Providers on your designated Care Team:   [place], PA-C  Jacolyn Reedy, [place]       Other Instructions *Please stay hydrated- Drink 4-6 bottles of water per day *Please check your blood pressure once in the morning and once in the evening for 7 days. Update our office with your readings.  Please sit quietly and calmly for at least 5 minutes prior to checking your blood pressure with your feet flat on the floor and your back against the back rest of the chair.

## 2023-05-17 NOTE — Progress Notes (Signed)
Clinical Summary Kendra [name] is a 77 y.o.female seen today for a focused visit on labile bp's  HTN - home bp up to 170/116 at times at home.  Reports bp will then drop down suddenly 98/60s.  - drinking very small amount of water. Sweet tea, orange juice. Overall poor hydration.  - not checking bp correctly at home as far as sitting 5 minutes, feet on floor.  - can get lightheaded when bp gets low.   - while doing physical therapy severe dizziness and fell - frequent orthostatic dizziness.      Other medical problems not addressed this visit.   1.History of chest pain 10/2020 nuclear stress: no ischemia 06-08-21 echo: 55-60%, no WMAs, indet diastolic - no recent chest pains     2.Palpitations 10/2020 monitor: rare ectopy, isoalted 4 beat run of NSVT - occasional palpitations, somewhat often.  - she ran out of propranolol 2 weeks ago.    3.HTN - compliant with meds   4. Hyperlipidemia 06/08/21 TC 174 TG 153 HDL 46 LDL 97 - she is on crestor 40mg  daily, started about 3 months ago     5. 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 90 tablet 1   aspirin EC 81 MG tablet Take 81 mg by mouth daily. Swallow whole.     clobetasol ointment (TEMOVATE) 0.05 % Apply to area at bedtime nightly 30 g 11   clopidogrel  (PLAVIX) 75 MG tablet Take 75 mg by mouth daily.     ezetimibe (ZETIA) 10 MG tablet Take 1 tablet (10 mg total) by mouth daily. 90 tablet 3   nitroGLYCERIN (NITROSTAT) 0.4 MG SL tablet Place 1 tablet (0.4 mg total) under the tongue every 5 (five) minutes x 3 doses as needed for chest pain. 30 tablet 12   pantoprazole (PROTONIX) 40 MG tablet Take 1 tablet by mouth once daily 90 tablet 0   propranolol (INDERAL) 20 MG tablet Take 0.5 tablets (10 mg total) by mouth 2 (two) times daily. 90 tablet 3   risperiDONE (RISPERDAL) 0.5 MG tablet Take 1 tablet (0.5 mg total) by mouth at bedtime. 90 tablet 1   rosuvastatin (CRESTOR) 40 MG tablet Take 1 tablet (40 mg total) by mouth daily. 90 tablet 3   sertraline (ZOLOFT) 50 MG tablet Take 3 tablets (150 mg total) by mouth at bedtime. 270 tablet 1   solifenacin (VESICARE) 10 MG tablet Take 1 tablet (10 mg total) by mouth daily. 30 tablet 11   topiramate (TOPAMAX) 50 MG tablet Take 1 tablet (50 mg total) by mouth daily. 90 tablet 1   cyanocobalamin (VITAMIN B12) 1000 MCG tablet Take 1,000 mcg by mouth  daily. (Patient not taking: Reported on 05/17/2023)     estradiol (ESTRACE) 0.1 MG/GM vaginal cream Apply a small amount to outside of urethra qhs 30 g 12   solifenacin (VESICARE) 10 MG tablet Take 1 tablet (10 mg total) by mouth daily. 30 tablet 0   No current facility-administered medications for this visit.     Past Surgical History:  Procedure Laterality Date   ABDOMINAL HYSTERECTOMY     r ovary removal   APPENDECTOMY  1970   CHOLECYSTECTOMY  2011   CYSTOSCOPY  1990   KIDNEY STONE SURGERY  2010   LAPAROSCOPY  1984   LEFT HEART CATH AND CORONARY ANGIOGRAPHY N/A 12/26/2017   Procedure: LEFT HEART CATH AND CORONARY ANGIOGRAPHY;  Surgeon: Runell Gess, MD;  Location: [facility];  Service: Cardiovascular;  Laterality: N/A;   OVARY SURGERY Left 1980   STERILIZATION  1979   THYROID SURGERY  2010     Allergies  Allergen Reactions    Charentais Melon (French Melon) Anaphylaxis   Morphine Swelling    REACTION: swelling   Pineapple Swelling    Throat swelling      Family History  Problem Relation Age of Onset   Arthritis Mother    Migraines Mother    Cancer Sister    Cancer Maternal Grandmother      Social History Ms. Stidd reports that she has never smoked. She has never used smokeless tobacco. Ms. Yanke reports no history of alcohol use.   Review of Systems CONSTITUTIONAL: No weight loss, fever, chills, weakness or fatigue.  HEENT: Eyes: No visual loss, blurred vision, double vision or yellow sclerae.No hearing loss, sneezing, congestion, runny nose or sore throat.  SKIN: No rash or itching.  CARDIOVASCULAR: per hpi RESPIRATORY: No shortness of breath, cough or sputum.  GASTROINTESTINAL: No anorexia, nausea, vomiting or diarrhea. No abdominal pain or blood.  GENITOURINARY: No burning on urination, no polyuria NEUROLOGICAL: No headache, dizziness, syncope, paralysis, ataxia, numbness or tingling in the extremities. No change in bowel or bladder control.  MUSCULOSKELETAL: No muscle, back pain, joint pain or stiffness.  LYMPHATICS: No enlarged nodes. No history of splenectomy.  PSYCHIATRIC: No history of depression or anxiety.  ENDOCRINOLOGIC: No reports of sweating, cold or heat intolerance. No polyuria or polydipsia.  Marland Kitchen   Physical Examination Vitals:   05/17/23 1507  BP: 112/68  Pulse: 84  SpO2: 97%   Filed Weights   05/17/23 1507  Weight: 203 lb (92.1 kg)    Gen: resting comfortably, no acute distress HEENT: no scleral icterus, pupils equal round and reactive, no palptable cervical adenopathy,  CV: RRR, no m/rg, no jvd Resp: Clear to auscultation bilaterally GI: abdomen is soft, non-tender, non-distended, normal bowel sounds, no hepatosplenomegaly MSK: extremities are warm, no edema.  Skin: warm, no rash Neuro:  no focal deficits Psych: appropriate affect   Diagnostic  Studies 10/2020 nuclear stress Lexiscan stress is electrically negative for ischemia Myoview scan is electrically negative for ischemia LVEF is calculated at 47% Consider echocardiogram to further define LVEF / wall motion Overall low risk scan    02/2022 nuclear stress The study is intermediate risk based upon reduced ejection fraction of 46%.   No ST deviation was noted.   LV perfusion is normal.  There is significant bowel loop uptake inhibiting inferior wall read accuracy seen at both rest and stress There is no evidence of ischemia. There is no evidence of infarction.   Left ventricular function is abnormal.  Ejection  fraction 46% global function is mildly reduced. End diastolic cavity size is normal. End systolic cavity size is normal.    Assessment and Plan   Labile blood pressure -low bp's likely related to poor oral hydration based on history, though today orthostatics are negative. Does have 16 beat elevation in HR with standing, 12 point drop in SBP.  - encouraged drinking at least 2-3 L of water per day - high bp's may be due to inappropriate bp measurements, discussed sitting 5 minutes with feet on floor and arm at rest. SHe will keep bp log and update [place] in 1 week       Antoine Poche, M.D.,

## 2023-05-21 ENCOUNTER — Encounter (HOSPITAL_COMMUNITY): Payer: Medicare HMO | Admitting: Physical Therapy

## 2023-05-21 DIAGNOSIS — H2513 Age-related nuclear cataract, bilateral: Secondary | ICD-10-CM | POA: Diagnosis not present

## 2023-05-21 DIAGNOSIS — H2512 Age-related nuclear cataract, left eye: Secondary | ICD-10-CM | POA: Diagnosis not present

## 2023-05-21 DIAGNOSIS — H2511 Age-related nuclear cataract, right eye: Secondary | ICD-10-CM | POA: Diagnosis not present

## 2023-05-21 DIAGNOSIS — Z01818 Encounter for other preprocedural examination: Secondary | ICD-10-CM | POA: Diagnosis not present

## 2023-05-23 ENCOUNTER — Encounter (HOSPITAL_COMMUNITY): Payer: Medicare HMO | Admitting: Physical Therapy

## 2023-05-28 ENCOUNTER — Encounter (HOSPITAL_COMMUNITY): Payer: Medicare HMO

## 2023-05-29 ENCOUNTER — Ambulatory Visit: Payer: Medicare HMO | Admitting: Orthopaedic Surgery

## 2023-05-30 ENCOUNTER — Encounter (HOSPITAL_COMMUNITY): Payer: Medicare HMO | Admitting: Physical Therapy

## 2023-06-04 ENCOUNTER — Encounter (HOSPITAL_COMMUNITY): Payer: Medicare HMO | Admitting: Physical Therapy

## 2023-06-06 ENCOUNTER — Encounter (HOSPITAL_COMMUNITY): Payer: Medicare HMO

## 2023-06-11 ENCOUNTER — Ambulatory Visit: Payer: Medicare HMO | Attending: Orthopaedic Surgery | Admitting: Nurse Practitioner

## 2023-06-11 ENCOUNTER — Encounter: Payer: Self-pay | Admitting: Nurse Practitioner

## 2023-06-11 ENCOUNTER — Encounter (HOSPITAL_COMMUNITY): Payer: Medicare HMO

## 2023-06-11 VITALS — BP 122/80 | HR 75 | Ht 63.0 in | Wt 203.0 lb

## 2023-06-11 DIAGNOSIS — R0789 Other chest pain: Secondary | ICD-10-CM | POA: Diagnosis not present

## 2023-06-11 DIAGNOSIS — Z86718 Personal history of other venous thrombosis and embolism: Secondary | ICD-10-CM | POA: Diagnosis not present

## 2023-06-11 DIAGNOSIS — I10 Essential (primary) hypertension: Secondary | ICD-10-CM | POA: Insufficient documentation

## 2023-06-11 DIAGNOSIS — Z8673 Personal history of transient ischemic attack (TIA), and cerebral infarction without residual deficits: Secondary | ICD-10-CM | POA: Insufficient documentation

## 2023-06-11 DIAGNOSIS — R252 Cramp and spasm: Secondary | ICD-10-CM | POA: Insufficient documentation

## 2023-06-11 DIAGNOSIS — R600 Localized edema: Secondary | ICD-10-CM | POA: Insufficient documentation

## 2023-06-11 DIAGNOSIS — E785 Hyperlipidemia, unspecified: Secondary | ICD-10-CM | POA: Diagnosis not present

## 2023-06-11 MED ORDER — PROPRANOLOL HCL 20 MG PO TABS: 10.0000 mg | ORAL_TABLET | Freq: Two times a day (BID) | ORAL | 3 refills | Status: DC

## 2023-06-11 MED ORDER — NITROGLYCERIN 0.4 MG SL SUBL: 0.4000 mg | SUBLINGUAL_TABLET | SUBLINGUAL | 1 refills | Status: AC | PRN

## 2023-06-11 MED ORDER — AMLODIPINE BESYLATE 2.5 MG PO TABS: 2.5000 mg | ORAL_TABLET | Freq: Every day | ORAL | 4 refills | Status: DC

## 2023-06-11 NOTE — Progress Notes (Unsigned)
Cardiology Office Note:  .   Date:  06/11/2023 ID:  [name], DOB [birth year], MRN [id number] PCP: Tommie Sams, DO  [facility] Providers Cardiologist:  Dina Rich, MD    History of Present Illness: .   CHALANDA VIVOLO is a 77 y.o. female with a PMH of chest pain, palpitations, hypertension, hyperlipidemia, history of TIA in 2022, who presents today for 4-week follow-up.  Last seen by Dr. Dina Rich on May 17, 2023.  Patient had reported labile blood pressures, was felt that low BPs were related to poor oral hydration.  Orthostatics in office were negative.  Was encouraged to drink adequate amount of fluid per day.  Today she presents for 4-week follow-up. She is a difficult historian as she does not speak [place]. She is Spanish speaking and interpreter is available and present at time of visit. She states she continues to notice labile BP readings. Admits to occasional chest pressure that is intermittent, nitroglycerin helps her symptoms. Denies any specific triggers or component of exertion. Does have cramping in her legs that is noticed with walking. Says she is no longer taking Xarelto as she ran out of this medication. Per chart review, it was noted that she had a DVT in 2019, was to complete only 3 months of Xarelto at the time, CTA of chest was negative for PE at the time. Denies any shortness of breath, palpitations, syncope, presyncope, dizziness, orthopnea, PND, significant weight changes, acute bleeding.  ROS: Negative. See HPI.  SH: Originally [place] Leaving to go on trip to [place] soon.   Studies Reviewed: Marland Kitchen    EKG:  EKG Interpretation Date/Time:  Tuesday June 11 2023 13:30:56 EST Ventricular Rate:  76 PR Interval:  160 QRS Duration:  84 QT Interval:  398 QTC Calculation: 447 R Axis:   50  Text Interpretation: Normal sinus rhythm Normal ECG When compared with ECG of 01-Jun-2022 15:07, No significant change was found Confirmed  by Sharlene Dory [phone]) on 06/11/2023 1:34:51 PM   Lexiscan 02/2022:   The study is intermediate risk based upon reduced ejection fraction of 46%.   No ST deviation was noted.   LV perfusion is normal.  There is significant bowel loop uptake inhibiting inferior wall read accuracy seen at both rest and stress There is no evidence of ischemia. There is no evidence of infarction.   Left ventricular function is abnormal.  Ejection fraction 46% global function is mildly reduced. End diastolic cavity size is normal. End systolic cavity size is normal.   Echo 01/2022:  1. Abnormal septal motion inferior basal hypokinesis . Left ventricular  ejection fraction, by estimation, is 50 to 55%. The left ventricle has low  normal function. The left ventricle has no regional wall motion  abnormalities. The left ventricular  internal cavity size was mildly dilated. Left ventricular diastolic  parameters are consistent with Grade I diastolic dysfunction (impaired  relaxation).   2. Right ventricular systolic function is normal. The right ventricular  size is normal. There is normal pulmonary artery systolic pressure.   3. The mitral valve is normal in structure. No evidence of mitral valve  regurgitation. No evidence of mitral stenosis.   4. The aortic valve is tricuspid. There is mild calcification of the  aortic valve. There is mild thickening of the aortic valve. Aortic valve  regurgitation is not visualized. Aortic valve sclerosis is present, with  no evidence of aortic valve stenosis.   5. The inferior vena cava  is normal in size with greater than 50%  respiratory variability, suggesting right atrial pressure of 3 mmHg.  Cardiac monitor 10/2020 (Preliminary report):   Patch Wear Time:  12 days and 18 hours (2022-03-29T18:35:00-0400 to 2022-04-11T12:46:42-0400)   Patient had a min HR of 56 bpm, max HR of 152 bpm, and avg HR of 74 bpm. Predominant underlying rhythm was Sinus Rhythm. 1 run of  Supraventricular Tachycardia occurred lasting 4 beats with a max rate of 152 bpm (avg 132 bpm). Isolated SVEs were rare  (<1.0%), SVE Couplets were rare (<1.0%), and no SVE Triplets were present. Isolated VEs were rare (<1.0%), and no VE Couplets or VE Triplets were present.   Venous doppler 05/2020:  IMPRESSION: Negative for DVT in either extremity.   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  Venous doppler 04/2018:  IMPRESSION: No evidence of acute or chronic DVT within either lower extremity with special attention paid to the right peroneal vein.  LHC 12/2017:  IMPRESSION: Ms. Warring has essentially normal coronary arteries and normal filling pressures.  I believe her chest pain is noncardiac.  The sheath was removed and a TR band was placed on the right wrist to achieve patent hemostasis.  She stable to undergo systemic any coagulation with a novel oral anticoagulant given her DVT and can be discharged home either today or tomorrow by the tried hospitalist service.  Physical Exam:   VS:  BP 122/80   Pulse 75   Ht 5\' 3"  (1.6 m)   Wt 203 lb (92.1 kg)   SpO2 96%   BMI 35.96 kg/m    Wt Readings from Last 3 Encounters:  06/11/23 203 lb (92.1 kg)  05/17/23 203 lb (92.1 kg)  03/27/23 203 lb (92.1 kg)    GEN: Obese, 77 y.o. female in no acute distress NECK: No JVD; No carotid bruits CARDIAC: S1/S2, RRR, no murmurs, rubs, gallops RESPIRATORY:  Clear to auscultation without rales, wheezing or rhonchi  ABDOMEN: Soft, non-tender, non-distended EXTREMITIES:  Nonpitting edema to BLE; No deformity   ASSESSMENT AND PLAN: .    Chest pressure Patient is a difficult historian, does not speak [place].  Admits to some atypical symptoms/presentation of chest pressure.  Extensive review done of her past cardiac workup and discussed this in office through the use of translator.  NST in 2023 was negative for ischemia, with no evidence of ischemia  also noted during NST in 2022.  Left heart cath in 2019 showed essentially normal coronary arteries and normal filling pressures.  All this extensive workup has revealed that her chest pain is likely noncardiac.  EKG is reassuring today.  No indication for ischemic evaluation however will begin amlodipine 2.5 mg daily to help with BP and for antianginal effect.  Continue propranolol and will refill nitroglycerin per her request.  Care and ED precautions discussed.  Continue follow-up with PCP.  HTN Blood pressure today at goal in office.  Home BP log reviewed with recent SBP ranging from 97-155.  SBP averaging 130s to 140s.  Discussed SBP goal is less than 140.  Discussed to monitor BP at home at least 2 hours after medications and sitting for 5-10 minutes.  Will begin amlodipine 2.5 mg daily as mentioned above.  Continue rest of medication regimen. Heart healthy diet and regular cardiovascular exercise encouraged.   HLD LDL 275 01/2023.  Was started on rosuvastatin at that time.  This is currently being managed by PCP. Heart  healthy diet and regular cardiovascular exercise encouraged. Continue to follow with PCP. PCP to manage.  Hx of TIA Denies any signs or symptoms.  Continue current medication regimen.  Continue follow-up with PCP.  Care and ED precautions discussed.  5. Leg edema, leg cramping, hx of DVT Nonpitting edema noted to BLE.  She does have past history of DVT, completed 3 months of anticoagulation.  Admits to cramping sensation in legs with walking.  Will arrange venous Doppler to rule out acute DVT, though low likelihood as PE does not show any acute signs of DVT.  If testing comes back normal, plan to consider arranging ABIs at follow-up.  Care and ED precautions discussed. Continue to follow with PCP.     Dispo: Follow-up with me/APP in 1 month or sooner if anything changes.  Signed, Sharlene Dory, NP

## 2023-06-11 NOTE — Patient Instructions (Addendum)
Medication Instructions:  Your physician recommends that you continue on your current medications as directed. Please refer to the Current Medication list given to you today. Please monitor blood pressure 2 hours after taking your medicine   Labwork: None   Testing/Procedures: Your physician has requested that you have a lower or upper extremity venous duplex. This test is an ultrasound of the veins in the legs or arms. It looks at venous blood flow that carries blood from the heart to the legs or arms. Allow one hour for a Lower Venous exam. Allow thirty minutes for an Upper Venous exam. There are no restrictions or special instructions.  Please note: We ask at that you not bring children with you during ultrasound (echo/ vascular) testing. Due to room size and safety concerns, children are not allowed in the ultrasound rooms during exams. Our front office staff cannot provide observation of children in our lobby area while testing is being conducted. An adult accompanying a patient to their appointment will only be allowed in the ultrasound room at the discretion of the ultrasound technician under special circumstances. We apologize for any inconvenience.  Follow-Up: Your physician recommends that you schedule a follow-up appointment in: 1 Month   Any Other Special Instructions Will Be Listed Below (If Applicable).   If you need a refill on your cardiac medications before your next appointment, please call your pharmacy.

## 2023-06-13 ENCOUNTER — Encounter (HOSPITAL_COMMUNITY): Payer: Medicare HMO | Admitting: Physical Therapy

## 2023-06-27 ENCOUNTER — Ambulatory Visit: Payer: Medicare HMO | Attending: Nurse Practitioner

## 2023-07-11 ENCOUNTER — Ambulatory Visit: Payer: Medicare HMO | Attending: Nurse Practitioner | Admitting: Nurse Practitioner

## 2023-07-11 DIAGNOSIS — H2511 Age-related nuclear cataract, right eye: Secondary | ICD-10-CM | POA: Diagnosis not present

## 2023-07-11 DIAGNOSIS — G4733 Obstructive sleep apnea (adult) (pediatric): Secondary | ICD-10-CM | POA: Diagnosis not present

## 2023-07-11 NOTE — Progress Notes (Deleted)
Cardiology Office Note:  .   Date:  06/11/2023 ID:  Kendra [name], DOB 03-25-1946, MRN [id number] PCP: Kendra Sams, DO  [facility] Providers Cardiologist:  Kendra Rich, MD    History of Present Illness: .   Kendra [name] is a 77 y.o. female with a PMH of chest pain, palpitations, hypertension, hyperlipidemia, history of TIA in 2022, who presents today for 4-week follow-up.  Last seen by Dr. Dina [name] on May 17, 2023.  Patient had reported labile blood pressures, was felt that low BPs were related to poor oral hydration.  Orthostatics in office were negative.  Was encouraged to drink adequate amount of fluid per day.  Today she presents for 4-week follow-up. She is a difficult historian as she does not speak [place]. She is Spanish speaking and interpreter is available and present at time of visit. She states she continues to notice labile BP readings. Admits to occasional chest pressure that is intermittent, nitroglycerin helps her symptoms. Denies any specific triggers or component of exertion. Does have cramping in her legs that is noticed with walking. Says she is no longer taking Xarelto as she ran out of this medication. Per chart review, it was noted that she had a DVT in 2019, was to complete only 3 months of Xarelto at the time, CTA of chest was negative for PE at the time. Denies any shortness of breath, palpitations, syncope, presyncope, dizziness, orthopnea, PND, significant weight changes, acute bleeding.  ROS: Negative. See HPI.  SH: Originally [place] Leaving to go on trip to [place] soon.   Studies Reviewed: Marland Kitchen    EKG:      Lexiscan 02/2022:   The study is intermediate risk based upon reduced ejection fraction of 46%.   No ST deviation was noted.   LV perfusion is normal.  There is significant bowel loop uptake inhibiting inferior wall read accuracy seen at both rest and stress There is no evidence of ischemia. There is no evidence of  infarction.   Left ventricular function is abnormal.  Ejection fraction 46% global function is mildly reduced. End diastolic cavity size is normal. End systolic cavity size is normal.   Echo 01/2022:  1. Abnormal septal motion inferior basal hypokinesis . Left ventricular  ejection fraction, by estimation, is 50 to 55%. The left ventricle has low  normal function. The left ventricle has no regional wall motion  abnormalities. The left ventricular  internal cavity size was mildly dilated. Left ventricular diastolic  parameters are consistent with Grade I diastolic dysfunction (impaired  relaxation).   2. Right ventricular systolic function is normal. The right ventricular  size is normal. There is normal pulmonary artery systolic pressure.   3. The mitral valve is normal in structure. No evidence of mitral valve  regurgitation. No evidence of mitral stenosis.   4. The aortic valve is tricuspid. There is mild calcification of the  aortic valve. There is mild thickening of the aortic valve. Aortic valve  regurgitation is not visualized. Aortic valve sclerosis is present, with  no evidence of aortic valve stenosis.   5. The inferior vena cava is normal in size with greater than 50%  respiratory variability, suggesting right atrial pressure of 3 mmHg.  Cardiac monitor 10/2020 (Preliminary report):   Patch Wear Time:  12 days and 18 hours (2022-03-29T18:35:00-0400 to 2022-04-11T12:46:42-0400)   Patient had a min HR of 56 bpm, max HR of 152 bpm, and avg HR of 74 bpm. Predominant underlying  rhythm was Sinus Rhythm. 1 run of Supraventricular Tachycardia occurred lasting 4 beats with a max rate of 152 bpm (avg 132 bpm). Isolated SVEs were rare  (<1.0%), SVE Couplets were rare (<1.0%), and no SVE Triplets were present. Isolated VEs were rare (<1.0%), and no VE Couplets or VE Triplets were present.   Venous doppler 05/2020:  IMPRESSION: Negative for DVT in either extremity.   Nonspecific focal  ectasia of the left distal femoral vein measuring up to 2 cm in diameter.  Venous doppler 04/2018:  IMPRESSION: No evidence of acute or chronic DVT within either lower extremity with special attention paid to the right peroneal vein.  LHC 12/2017:  IMPRESSION: [name] has essentially normal coronary arteries and normal filling pressures.  I believe her chest pain is noncardiac.  The sheath was removed and a TR band was placed on the right wrist to achieve patent hemostasis.  She stable to undergo systemic any coagulation with a novel oral anticoagulant given her DVT and can be discharged home either today or tomorrow by the tried hospitalist service.  Physical Exam:   VS:  There were no vitals taken for this visit.   Wt Readings from Last 3 Encounters:  06/11/23 203 lb (92.1 kg)  05/17/23 203 lb (92.1 kg)  03/27/23 203 lb (92.1 kg)    GEN: Obese, 77 y.o. female in no acute distress NECK: No JVD; No carotid bruits CARDIAC: S1/S2, RRR, no murmurs, rubs, gallops RESPIRATORY:  Clear to auscultation without rales, wheezing or rhonchi  ABDOMEN: Soft, non-tender, non-distended EXTREMITIES:  Nonpitting edema to BLE; No deformity   ASSESSMENT AND PLAN: .    Chest pressure Patient is a difficult historian, does not speak [place].  Admits to some atypical symptoms/presentation of chest pressure.  Extensive review done of her past cardiac workup and discussed this in office through the use of translator.  NST in 2023 was negative for ischemia, with no evidence of ischemia also noted during NST in 2022.  Left heart cath in 2019 showed essentially normal coronary arteries and normal filling pressures.  All this extensive workup has revealed that her chest pain is likely noncardiac.  EKG is reassuring today.  No indication for ischemic evaluation however will begin amlodipine 2.5 mg daily to help with BP and for antianginal effect.  Continue propranolol and will refill nitroglycerin per her request.   Care and ED precautions discussed.  Continue follow-up with PCP.  HTN Blood pressure today at goal in office.  Home BP log reviewed with recent SBP ranging from 97-155.  SBP averaging 130s to 140s.  Discussed SBP goal is less than 140.  Discussed to monitor BP at home at least 2 hours after medications and sitting for 5-10 minutes.  Will begin amlodipine 2.5 mg daily as mentioned above.  Continue rest of medication regimen. Heart healthy diet and regular cardiovascular exercise encouraged.   HLD LDL 275 01/2023.  Was started on rosuvastatin at that time.  This is currently being managed by PCP. Heart healthy diet and regular cardiovascular exercise encouraged. Continue to follow with PCP. PCP to manage.  Hx of TIA Denies any signs or symptoms.  Continue current medication regimen.  Continue follow-up with PCP.  Care and ED precautions discussed.  5. Leg edema, leg cramping, hx of DVT Nonpitting edema noted to BLE.  She does have past history of DVT, completed 3 months of anticoagulation.  Admits to cramping sensation in legs with walking.  Will arrange venous Doppler to rule  out acute DVT, though low likelihood as PE does not show any acute signs of DVT.  If testing comes back normal, plan to consider arranging ABIs at follow-up.  Care and ED precautions discussed. Continue to follow with PCP.     Dispo: Follow-up with me/APP in 1 month or sooner if anything changes.  Signed, Sharlene Dory, NP

## 2023-07-23 ENCOUNTER — Ambulatory Visit: Payer: Medicare HMO | Admitting: Nurse Practitioner

## 2023-07-23 NOTE — Progress Notes (Deleted)
 Cardiology Office Note:  .   Date:  06/11/2023 ID:  Kendra [name], DOB 03-25-1946, MRN [id number] PCP: Kendra Sams, DO  [facility] Providers Cardiologist:  Kendra Rich, MD    History of Present Illness: .   Kendra [name] is a 77 y.o. female with a PMH of chest pain, palpitations, hypertension, hyperlipidemia, history of TIA in 2022, who presents today for 4-week follow-up.  Last seen by Dr. Dina [name] on May 17, 2023.  Patient had reported labile blood pressures, was felt that low BPs were related to poor oral hydration.  Orthostatics in office were negative.  Was encouraged to drink adequate amount of fluid per day.  Today she presents for 4-week follow-up. She is a difficult historian as she does not speak Kendra [name]. She is Spanish speaking and interpreter is available and present at time of visit. She states she continues to notice labile BP readings. Admits to occasional chest pressure that is intermittent, nitroglycerin helps her symptoms. Denies any specific triggers or component of exertion. Does have cramping in her legs that is noticed with walking. Says she is no longer taking Xarelto as she ran out of this medication. Per chart review, it was noted that she had a DVT in 2019, was to complete only 3 months of Xarelto at the time, CTA of chest was negative for PE at the time. Denies any shortness of breath, palpitations, syncope, presyncope, dizziness, orthopnea, PND, significant weight changes, acute bleeding.  ROS: Negative. See HPI.  SH: Originally [place] Leaving to go on trip to [place] soon.   Studies Reviewed: Marland Kitchen    EKG:      Lexiscan 02/2022:   The study is intermediate risk based upon reduced ejection fraction of 46%.   No ST deviation was noted.   LV perfusion is normal.  There is significant bowel loop uptake inhibiting inferior wall read accuracy seen at both rest and stress There is no evidence of ischemia. There is no evidence of  infarction.   Left ventricular function is abnormal.  Ejection fraction 46% global function is mildly reduced. End diastolic cavity size is normal. End systolic cavity size is normal.   Echo 01/2022:  1. Abnormal septal motion inferior basal hypokinesis . Left ventricular  ejection fraction, by estimation, is 50 to 55%. The left ventricle has low  normal function. The left ventricle has no regional wall motion  abnormalities. The left ventricular  internal cavity size was mildly dilated. Left ventricular diastolic  parameters are consistent with Grade I diastolic dysfunction (impaired  relaxation).   2. Right ventricular systolic function is normal. The right ventricular  size is normal. There is normal pulmonary artery systolic pressure.   3. The mitral valve is normal in structure. No evidence of mitral valve  regurgitation. No evidence of mitral stenosis.   4. The aortic valve is tricuspid. There is mild calcification of the  aortic valve. There is mild thickening of the aortic valve. Aortic valve  regurgitation is not visualized. Aortic valve sclerosis is present, with  no evidence of aortic valve stenosis.   5. The inferior vena cava is normal in size with greater than 50%  respiratory variability, suggesting right atrial pressure of 3 mmHg.  Cardiac monitor 10/2020 (Preliminary report):   Patch Wear Time:  12 days and 18 hours (2022-03-29T18:35:00-0400 to 2022-04-11T12:46:42-0400)   Patient had a min HR of 56 bpm, max HR of 152 bpm, and avg HR of 74 bpm. Predominant underlying  rhythm was Sinus Rhythm. 1 run of Supraventricular Tachycardia occurred lasting 4 beats with a max rate of 152 bpm (avg 132 bpm). Isolated SVEs were rare  (<1.0%), SVE Couplets were rare (<1.0%), and no SVE Triplets were present. Isolated VEs were rare (<1.0%), and no VE Couplets or VE Triplets were present.   Venous doppler 05/2020:  IMPRESSION: Negative for DVT in either extremity.   Nonspecific focal  ectasia of the left distal femoral vein measuring up to 2 cm in diameter.  Venous doppler 04/2018:  IMPRESSION: No evidence of acute or chronic DVT within either lower extremity with special attention paid to the right peroneal vein.  LHC 12/2017:  IMPRESSION: Kendra [name] has essentially normal coronary arteries and normal filling pressures.  I believe her chest pain is noncardiac.  The sheath was removed and a TR band was placed on the right wrist to achieve patent hemostasis.  She stable to undergo systemic any coagulation with a novel oral anticoagulant given her DVT and can be discharged home either today or tomorrow by the tried hospitalist service.  Physical Exam:   VS:  There were no vitals taken for this visit.   Wt Readings from Last 3 Encounters:  06/11/23 203 lb (92.1 kg)  05/17/23 203 lb (92.1 kg)  03/27/23 203 lb (92.1 kg)    GEN: Obese, 77 y.o. female in no acute distress NECK: No JVD; No carotid bruits CARDIAC: S1/S2, RRR, no murmurs, rubs, gallops RESPIRATORY:  Clear to auscultation without rales, wheezing or rhonchi  ABDOMEN: Soft, non-tender, non-distended EXTREMITIES:  Nonpitting edema to BLE; No deformity   ASSESSMENT AND PLAN: .    Chest pressure Patient is a difficult historian, does not speak Kendra [name].  Admits to some atypical symptoms/presentation of chest pressure.  Extensive review done of her past cardiac workup and discussed this in office through the use of translator.  NST in 2023 was negative for ischemia, with no evidence of ischemia also noted during NST in 2022.  Left heart cath in 2019 showed essentially normal coronary arteries and normal filling pressures.  All this extensive workup has revealed that her chest pain is likely noncardiac.  EKG is reassuring today.  No indication for ischemic evaluation however will begin amlodipine 2.5 mg daily to help with BP and for antianginal effect.  Continue propranolol and will refill nitroglycerin per her request.   Care and ED precautions discussed.  Continue follow-up with PCP.  HTN Blood pressure today at goal in office.  Home BP log reviewed with recent SBP ranging from 97-155.  SBP averaging 130s to 140s.  Discussed SBP goal is less than 140.  Discussed to monitor BP at home at least 2 hours after medications and sitting for 5-10 minutes.  Will begin amlodipine 2.5 mg daily as mentioned above.  Continue rest of medication regimen. Heart healthy diet and regular cardiovascular exercise encouraged.   HLD LDL 275 01/2023.  Was started on rosuvastatin at that time.  This is currently being managed by PCP. Heart healthy diet and regular cardiovascular exercise encouraged. Continue to follow with PCP. PCP to manage.  Hx of TIA Denies any signs or symptoms.  Continue current medication regimen.  Continue follow-up with PCP.  Care and ED precautions discussed.  5. Leg edema, leg cramping, hx of DVT Nonpitting edema noted to BLE.  She does have past history of DVT, completed 3 months of anticoagulation.  Admits to cramping sensation in legs with walking.  Will arrange venous Doppler to rule  out acute DVT, though low likelihood as PE does not show any acute signs of DVT.  If testing comes back normal, plan to consider arranging ABIs at follow-up.  Care and ED precautions discussed. Continue to follow with PCP.     Dispo: Follow-up with me/APP in 1 month or sooner if anything changes.  Signed, Sharlene Dory, NP

## 2023-08-03 ENCOUNTER — Other Ambulatory Visit: Payer: Self-pay | Admitting: Family Medicine

## 2023-08-03 DIAGNOSIS — K219 Gastro-esophageal reflux disease without esophagitis: Secondary | ICD-10-CM

## 2023-08-07 NOTE — Telephone Encounter (Signed)
 Spoke with patient and husband states she does not take topamax  for headaches.

## 2023-08-08 DIAGNOSIS — F418 Other specified anxiety disorders: Secondary | ICD-10-CM | POA: Diagnosis not present

## 2023-08-08 DIAGNOSIS — H2512 Age-related nuclear cataract, left eye: Secondary | ICD-10-CM | POA: Diagnosis not present

## 2023-08-08 DIAGNOSIS — I10 Essential (primary) hypertension: Secondary | ICD-10-CM | POA: Diagnosis not present

## 2023-08-08 DIAGNOSIS — G4733 Obstructive sleep apnea (adult) (pediatric): Secondary | ICD-10-CM | POA: Diagnosis not present

## 2023-08-15 ENCOUNTER — Ambulatory Visit (INDEPENDENT_AMBULATORY_CARE_PROVIDER_SITE_OTHER): Payer: Medicare HMO | Admitting: Family Medicine

## 2023-08-15 VITALS — BP 130/68 | HR 75 | Temp 98.0°F | Ht 63.0 in | Wt 199.0 lb

## 2023-08-15 DIAGNOSIS — Z130 Encounter for screening for diseases of the blood and blood-forming organs and certain disorders involving the immune mechanism: Secondary | ICD-10-CM | POA: Diagnosis not present

## 2023-08-15 DIAGNOSIS — F32A Depression, unspecified: Secondary | ICD-10-CM | POA: Diagnosis not present

## 2023-08-15 DIAGNOSIS — I10 Essential (primary) hypertension: Secondary | ICD-10-CM | POA: Diagnosis not present

## 2023-08-15 DIAGNOSIS — R7303 Prediabetes: Secondary | ICD-10-CM

## 2023-08-15 DIAGNOSIS — K219 Gastro-esophageal reflux disease without esophagitis: Secondary | ICD-10-CM

## 2023-08-15 DIAGNOSIS — E782 Mixed hyperlipidemia: Secondary | ICD-10-CM | POA: Diagnosis not present

## 2023-08-15 DIAGNOSIS — Z23 Encounter for immunization: Secondary | ICD-10-CM | POA: Diagnosis not present

## 2023-08-15 DIAGNOSIS — F449 Dissociative and conversion disorder, unspecified: Secondary | ICD-10-CM | POA: Diagnosis not present

## 2023-08-15 MED ORDER — RISPERIDONE 0.5 MG PO TABS: 0.5000 mg | ORAL_TABLET | Freq: Every day | ORAL | 1 refills | Status: DC

## 2023-08-15 MED ORDER — PANTOPRAZOLE SODIUM 40 MG PO TBEC: 40.0000 mg | DELAYED_RELEASE_TABLET | Freq: Every day | ORAL | 1 refills | Status: AC

## 2023-08-15 MED ORDER — ROSUVASTATIN CALCIUM 40 MG PO TABS: 40.0000 mg | ORAL_TABLET | Freq: Every day | ORAL | 3 refills | Status: AC

## 2023-08-15 MED ORDER — AMITRIPTYLINE HCL 50 MG PO TABS: 50.0000 mg | ORAL_TABLET | Freq: Every day | ORAL | 1 refills | Status: DC

## 2023-08-15 NOTE — Patient Instructions (Signed)
Labs today.  Medications refilled.  Referral placed.  Follow up in 6 months.

## 2023-08-16 LAB — CMP14+EGFR
ALT: 59 [IU]/L — ABNORMAL HIGH (ref 0–32)
AST: 56 [IU]/L — ABNORMAL HIGH (ref 0–40)
Albumin: 4.5 g/dL (ref 3.8–4.8)
Alkaline Phosphatase: 126 [IU]/L — ABNORMAL HIGH (ref 44–121)
BUN/Creatinine Ratio: 17 (ref 12–28)
BUN: 13 mg/dL (ref 8–27)
Bilirubin Total: 0.5 mg/dL (ref 0.0–1.2)
CO2: 25 mmol/L (ref 20–29)
Calcium: 9.8 mg/dL (ref 8.7–10.3)
Chloride: 105 mmol/L (ref 96–106)
Creatinine, Ser: 0.76 mg/dL (ref 0.57–1.00)
Globulin, Total: 2.4 g/dL (ref 1.5–4.5)
Glucose: 111 mg/dL — ABNORMAL HIGH (ref 70–99)
Potassium: 4.7 mmol/L (ref 3.5–5.2)
Sodium: 146 mmol/L — ABNORMAL HIGH (ref 134–144)
Total Protein: 6.9 g/dL (ref 6.0–8.5)
eGFR: 80 mL/min/{1.73_m2} (ref >59)

## 2023-08-16 LAB — CBC
Hematocrit: 47.5 % — ABNORMAL HIGH (ref 34.0–46.6)
Hemoglobin: 15.7 g/dL (ref 11.1–15.9)
MCH: 32 pg (ref 26.6–33.0)
MCHC: 33.1 g/dL (ref 31.5–35.7)
MCV: 97 fL (ref 79–97)
Platelets: 299 10*3/uL (ref 150–450)
RBC: 4.91 x10E6/uL (ref 3.77–5.28)
RDW: 12.7 % (ref 11.7–15.4)
WBC: 7.7 10*3/uL (ref 3.4–10.8)

## 2023-08-16 LAB — MICROALBUMIN / CREATININE URINE RATIO
Creatinine, Urine: 169.5 mg/dL
Microalb/Creat Ratio: 32 mg/g{creat} — ABNORMAL HIGH (ref 0–29)
Microalbumin, Urine: 54.1 ug/mL

## 2023-08-16 LAB — LIPID PANEL
Chol/HDL Ratio: 3 {ratio} (ref 0.0–4.4)
Cholesterol, Total: 167 mg/dL (ref 100–199)
HDL: 55 mg/dL (ref >39)
LDL Chol Calc (NIH): 90 mg/dL (ref 0–99)
Triglycerides: 122 mg/dL (ref 0–149)
VLDL Cholesterol Cal: 22 mg/dL (ref 5–40)

## 2023-08-16 LAB — HEMOGLOBIN A1C
Est. average glucose Bld gHb Est-mCnc: 131 mg/dL
Hgb A1c MFr Bld: 6.2 % — ABNORMAL HIGH (ref 4.8–5.6)

## 2023-08-18 MED ORDER — SERTRALINE HCL 50 MG PO TABS: 150.0000 mg | ORAL_TABLET | Freq: Every day | ORAL | 1 refills | Status: AC

## 2023-08-18 NOTE — Assessment & Plan Note (Signed)
Meds refilled. Referring to psychiatry.

## 2023-08-18 NOTE — Progress Notes (Signed)
Subjective:  Patient ID: Kendra [name], female    DOB: 1946/01/14  Age: 78 y.o. MRN: [id number]  CC:   Chief Complaint  Patient presents with   Follow-up    Med refills    HPI:  78 year old female with the below mentioned medical problems presents for follow up.  Patient's BP is well controlled on Norvasc and Propranolol.   Patient reports worsening depression. Unsure why. She does note that it is difficult with her family not being close by. She has a long standing history of depression and a history of conversion disorder. She is on Zoloft, Risperdal, and Elavil.  Needs labs today. Amendable to flu and pneumococcal vaccines today.   Patient Active Problem List   Diagnosis Date Noted   Lichen sclerosus of vulva 02/12/2023   Prediabetes 11/08/2021   Chronic migraine without aura 08/10/2021   Chronic low back pain 08/10/2021   Conversion disorder    History of DVT (deep vein thrombosis) 12/26/2017   Essential hypertension 12/25/2017   Mixed hyperlipidemia 12/25/2017   Depression 03/17/2010   OSA on CPAP 10/12/2009   GERD 03/07/2007    Social Hx   Social History   Socioeconomic History   Marital status: Married    Spouse name: Not on file   Number of children: Not on file   Years of education: Not on file   Highest education level: Not on file  Occupational History   Not on file  Tobacco Use   Smoking status: Never   Smokeless tobacco: Never  Vaping Use   Vaping status: Never Used  Substance and Sexual Activity   Alcohol use: No   Drug use: No   Sexual activity: Never    Birth control/protection: None  Other Topics Concern   Not on file  Social History Narrative   Not on file   Social Drivers of Health   Financial Resource Strain: Not on file  Food Insecurity: Not on file  Transportation Needs: Not on file  Physical Activity: Not on file  Stress: Not on file  Social Connections: Not on file    Review of Systems Per HPI  Objective:  BP  130/68   Pulse 75   Temp 98 F (36.7 C)   Ht 5\' 3"  (1.6 m)   Wt 199 lb (90.3 kg)   SpO2 95%   BMI 35.25 kg/m      08/15/2023    1:14 PM 06/11/2023    1:20 PM 05/17/2023    3:07 PM  BP/Weight  Systolic BP 130 122 112  Diastolic BP 68 80 68  Wt. (Lbs) 199 203 203  BMI 35.25 kg/m2 35.96 kg/m2 35.96 kg/m2    Physical Exam Vitals and nursing note reviewed.  Constitutional:      General: She is not in acute distress.    Appearance: Normal appearance.  HENT:     Head: Normocephalic and atraumatic.  Eyes:     General:        Right eye: No discharge.        Left eye: No discharge.     Conjunctiva/sclera: Conjunctivae normal.  Cardiovascular:     Rate and Rhythm: Normal rate and regular rhythm.  Pulmonary:     Effort: Pulmonary effort is normal.     Breath sounds: Normal breath sounds. No wheezing, rhonchi or rales.  Neurological:     Mental Status: She is alert.  Psychiatric:     Comments: Flat affect. Tearful.  Lab Results  Component Value Date   WBC 7.7 08/15/2023   HGB 15.7 08/15/2023   HCT 47.5 (H) 08/15/2023   PLT 299 08/15/2023   GLUCOSE 111 (H) 08/15/2023   CHOL 167 08/15/2023   TRIG 122 08/15/2023   HDL 55 08/15/2023   LDLCALC 90 08/15/2023   ALT 59 (H) 08/15/2023   AST 56 (H) 08/15/2023   NA 146 (H) 08/15/2023   K 4.7 08/15/2023   CL 105 08/15/2023   CREATININE 0.76 08/15/2023   BUN 13 08/15/2023   CO2 25 08/15/2023   TSH 0.995 02/14/2022   INR 1.0 02/13/2022   HGBA1C 6.2 (H) 08/15/2023     Assessment & Plan:   Problem List Items Addressed This Visit       Cardiovascular and Mediastinum   Essential hypertension - Primary   Stable. Continue current medications.      Relevant Medications   rosuvastatin (CRESTOR) 40 MG tablet   Other Relevant Orders   CMP14+EGFR (Completed)   Microalbumin / creatinine urine ratio (Completed)     Digestive   GERD   Relevant Medications   pantoprazole (PROTONIX) 40 MG tablet     Other    Conversion disorder (Chronic)   Meds refilled. Referring to psychiatry.      Relevant Orders   Ambulatory referral to Psychiatry   Prediabetes   Relevant Orders   Hemoglobin A1c (Completed)   Mixed hyperlipidemia   Lipid panel today. Continue Crestor.      Relevant Medications   rosuvastatin (CRESTOR) 40 MG tablet   Other Relevant Orders   Lipid panel (Completed)   Depression   Meds refilled. Referring to psychiatry.      Relevant Medications   amitriptyline (ELAVIL) 50 MG tablet   sertraline (ZOLOFT) 50 MG tablet   Other Relevant Orders   Ambulatory referral to Psychiatry   Other Visit Diagnoses       Screening for deficiency anemia       Relevant Orders   CBC (Completed)     Immunization due       Relevant Orders   Pneumococcal conjugate vaccine 20-valent (Prevnar 20) (Completed)   Flu Vaccine Trivalent High Dose (Fluad) (Completed)       Meds ordered this encounter  Medications   amitriptyline (ELAVIL) 50 MG tablet    Sig: Take 1 tablet (50 mg total) by mouth at bedtime.    Dispense:  90 tablet    Refill:  1   pantoprazole (PROTONIX) 40 MG tablet    Sig: Take 1 tablet (40 mg total) by mouth daily.    Dispense:  90 tablet    Refill:  1   risperiDONE (RISPERDAL) 0.5 MG tablet    Sig: Take 1 tablet (0.5 mg total) by mouth at bedtime.    Dispense:  90 tablet    Refill:  1   rosuvastatin (CRESTOR) 40 MG tablet    Sig: Take 1 tablet (40 mg total) by mouth daily.    Dispense:  90 tablet    Refill:  3   sertraline (ZOLOFT) 50 MG tablet    Sig: Take 3 tablets (150 mg total) by mouth at bedtime.    Dispense:  270 tablet    Refill:  1    Follow-up:  6 months  Yoanna Jurczyk Adriana Simas DO [facility]

## 2023-08-18 NOTE — Assessment & Plan Note (Signed)
Stable.  Continue current medications.

## 2023-08-18 NOTE — Assessment & Plan Note (Signed)
Lipid panel today. Continue Crestor.

## 2023-08-21 ENCOUNTER — Other Ambulatory Visit: Payer: Self-pay

## 2023-08-21 DIAGNOSIS — E870 Hyperosmolality and hypernatremia: Secondary | ICD-10-CM

## 2023-09-02 ENCOUNTER — Encounter: Payer: Self-pay | Admitting: Nurse Practitioner

## 2023-09-02 ENCOUNTER — Ambulatory Visit: Payer: Medicare HMO | Attending: Orthopaedic Surgery | Admitting: Nurse Practitioner

## 2023-09-02 VITALS — BP 110/72 | HR 83 | Ht 63.0 in | Wt 199.8 lb

## 2023-09-02 DIAGNOSIS — R600 Localized edema: Secondary | ICD-10-CM | POA: Diagnosis not present

## 2023-09-02 DIAGNOSIS — R0789 Other chest pain: Secondary | ICD-10-CM | POA: Diagnosis not present

## 2023-09-02 DIAGNOSIS — M25551 Pain in right hip: Secondary | ICD-10-CM | POA: Diagnosis not present

## 2023-09-02 DIAGNOSIS — E785 Hyperlipidemia, unspecified: Secondary | ICD-10-CM | POA: Insufficient documentation

## 2023-09-02 DIAGNOSIS — Z8673 Personal history of transient ischemic attack (TIA), and cerebral infarction without residual deficits: Secondary | ICD-10-CM | POA: Diagnosis not present

## 2023-09-02 DIAGNOSIS — M545 Low back pain, unspecified: Secondary | ICD-10-CM | POA: Diagnosis not present

## 2023-09-02 DIAGNOSIS — I10 Essential (primary) hypertension: Secondary | ICD-10-CM | POA: Insufficient documentation

## 2023-09-02 DIAGNOSIS — Z86718 Personal history of other venous thrombosis and embolism: Secondary | ICD-10-CM | POA: Insufficient documentation

## 2023-09-02 DIAGNOSIS — M25552 Pain in left hip: Secondary | ICD-10-CM | POA: Insufficient documentation

## 2023-09-02 NOTE — Progress Notes (Signed)
Cardiology Office Note:  .   Date: 09/02/2023 ID:  Kendra [name], DOB January 02, 1946, MRN [id number] PCP: Tommie Sams, DO  [facility] Providers Cardiologist:  Dina Rich, MD    History of Present Illness: .   Kendra [name] is a 78 y.o. female with a PMH of chest pain, palpitations, hypertension, hyperlipidemia, history of TIA in 2022, who presents today for 4-week follow-up.  Last seen by Dr. Dina Rich on May 17, 2023.  Patient had reported labile blood pressures, was felt that low BPs were related to poor oral hydration.  Orthostatics in office were negative.  Was encouraged to drink adequate amount of fluid per day.  06/11/2023 - Today she presents for 4-week follow-up. She is a difficult historian as she does not speak [place]. She is Spanish speaking and interpreter is available and present at time of visit. She states she continues to notice labile BP readings. Admits to occasional chest pressure that is intermittent, nitroglycerin helps her symptoms. Denies any specific triggers or component of exertion. Does have cramping in her legs that is noticed with walking. Says she is no longer taking Xarelto as she ran out of this medication. Per chart review, it was noted that she had a DVT in 2019, was to complete only 3 months of Xarelto at the time, CTA of chest was negative for PE at the time. Denies any shortness of breath, palpitations, syncope, presyncope, dizziness, orthopnea, PND, significant weight changes, acute bleeding.  09/02/2023 - She presents for follow-up today with her husband and interpreter. She is a difficult historian as she is non-English speaking. She admits to back pain and hip pain noticed with walking. She continues to admit to improved CP since last office visit, experienced some CP yesterday, difficult for her to describe. Admits to labile BP readings and intermittent swelling in her lower extremities. Denies any shortness of breath, palpitations,  syncope, presyncope, dizziness, orthopnea, PND, significant weight changes, acute bleeding, or claudication.  ROS: Negative. See HPI.  SH: Originally [place] Leaving to go on trip to [place] soon.   Studies Reviewed: Marland Kitchen    EKG: EKG is not ordered today.     Lexiscan 02/2022:   The study is intermediate risk based upon reduced ejection fraction of 46%.   No ST deviation was noted.   LV perfusion is normal.  There is significant bowel loop uptake inhibiting inferior wall read accuracy seen at both rest and stress There is no evidence of ischemia. There is no evidence of infarction.   Left ventricular function is abnormal.  Ejection fraction 46% global function is mildly reduced. End diastolic cavity size is normal. End systolic cavity size is normal.   Echo 01/2022:  1. Abnormal septal motion inferior basal hypokinesis . Left ventricular  ejection fraction, by estimation, is 50 to 55%. The left ventricle has low  normal function. The left ventricle has no regional wall motion  abnormalities. The left ventricular  internal cavity size was mildly dilated. Left ventricular diastolic  parameters are consistent with Grade I diastolic dysfunction (impaired  relaxation).   2. Right ventricular systolic function is normal. The right ventricular  size is normal. There is normal pulmonary artery systolic pressure.   3. The mitral valve is normal in structure. No evidence of mitral valve  regurgitation. No evidence of mitral stenosis.   4. The aortic valve is tricuspid. There is mild calcification of the  aortic valve. There is mild thickening of the aortic  valve. Aortic valve  regurgitation is not visualized. Aortic valve sclerosis is present, with  no evidence of aortic valve stenosis.   5. The inferior vena cava is normal in size with greater than 50%  respiratory variability, suggesting right atrial pressure of 3 mmHg.  Cardiac monitor 10/2020 (Preliminary report):   Patch Wear Time:   12 days and 18 hours (2022-03-29T18:35:00-0400 to 2022-04-11T12:46:42-0400)   Patient had a min HR of 56 bpm, max HR of 152 bpm, and avg HR of 74 bpm. Predominant underlying rhythm was Sinus Rhythm. 1 run of Supraventricular Tachycardia occurred lasting 4 beats with a max rate of 152 bpm (avg 132 bpm). Isolated SVEs were rare  (<1.0%), SVE Couplets were rare (<1.0%), and no SVE Triplets were present. Isolated VEs were rare (<1.0%), and no VE Couplets or VE Triplets were present.   Venous doppler 05/2020:  IMPRESSION: Negative for DVT in either extremity.   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  Venous doppler 04/2018:  IMPRESSION: No evidence of acute or chronic DVT within either lower extremity with special attention paid to the right peroneal vein.  LHC 12/2017:  IMPRESSION: [name] has essentially normal coronary arteries and normal filling pressures.  I believe her chest pain is noncardiac.  The sheath was removed and a TR band was placed on the right wrist to achieve patent hemostasis.  She stable to undergo systemic any coagulation with a novel oral anticoagulant given her DVT and can be discharged home either today or tomorrow by the tried hospitalist service.  Physical Exam:   VS:  BP 110/72   Pulse 83   Ht 5\' 3"  (1.6 m)   Wt 199 lb 12.8 oz (90.6 kg)   SpO2 97%   BMI 35.39 kg/m    Wt Readings from Last 3 Encounters:  09/02/23 199 lb 12.8 oz (90.6 kg)  08/15/23 199 lb (90.3 kg)  06/11/23 203 lb (92.1 kg)    GEN: Obese, 78 y.o. female in no acute distress NECK: No JVD; No carotid bruits CARDIAC: S1/S2, RRR, no murmurs, rubs, gallops RESPIRATORY:  Clear to auscultation without rales, wheezing or rhonchi  ABDOMEN: Soft, non-tender, non-distended EXTREMITIES:  Nonpitting edema to BLE; No deformity   ASSESSMENT AND PLAN: .    Chest pressure Patient is a difficult historian, does not speak [place].  Continues to  admit to some atypical symptoms/presentation of chest pressure, but improved since last office visit.  Extensive review done of her past cardiac workup and discussed this in office through the use of translator.  NST in 2023 was negative for ischemia, with no evidence of ischemia also noted during NST in 2022.  Left heart cath in 2019 showed essentially normal coronary arteries and normal filling pressures.  All this extensive workup has revealed that her chest pain is likely noncardiac. No indication for ischemic evaluation. Continue current medication regimen. Care and ED precautions discussed.  Continue follow-up with PCP.  HTN Blood pressure today at goal in office. Admits to labile BP readings at home.  Discussed SBP goal is less than 140.  Discussed to monitor BP at home at least 2 hours after medications and sitting for 5-10 minutes.   Continue medication regimen. Heart healthy diet and regular cardiovascular exercise encouraged. Will bring back in 2-3 weeks for a BP check and to evaluate her readings.   HLD LDL 90 07/2023. Continue rosuvastatin. Being managed by PCP. Heart healthy diet and regular  cardiovascular exercise encouraged.   Hx of TIA Denies any signs or symptoms.  Continue current medication regimen.  Continue follow-up with PCP.  Care and ED precautions discussed.  5. Leg edema, hx of DVT Nonpitting edema noted to BLE.  She does have past history of DVT, no sign/symptom of DVT noted today. Continue current medication regimen. Care and ED precautions discussed. Continue to follow with PCP.   6. Back pain, hip pain Hx of chronic low back pain and also admits to hip pain. Most recent lumbar spine X-ray revealed degenerative changes since 2022, nothing acute noted. Recommended to f/u with care team and PCP regarding this, who is managing this.      Dispo: Follow-up with me/APP in 4-6 weeks or sooner if anything changes.  Signed, Sharlene Dory, NP

## 2023-09-02 NOTE — Patient Instructions (Addendum)
 Medication Instructions:  Your physician recommends that you continue on your current medications as directed. Please refer to the Current Medication list given to you today.  Labwork: None   Testing/Procedures: None   Follow-Up: Your physician recommends that you schedule a follow-up appointment in:  2-3 weeks nurse visit for BP check 4-6 weeks   Any Other Special Instructions Will Be Listed Below (If Applicable).  If you need a refill on your cardiac medications before your next appointment, please call your pharmacy.

## 2023-09-10 ENCOUNTER — Other Ambulatory Visit: Payer: Self-pay | Admitting: Cardiology

## 2023-09-10 MED ORDER — AMLODIPINE BESYLATE 2.5 MG PO TABS: 2.5000 mg | ORAL_TABLET | Freq: Every day | ORAL | 4 refills | Status: DC

## 2023-09-13 DIAGNOSIS — M791 Myalgia, unspecified site: Secondary | ICD-10-CM | POA: Diagnosis not present

## 2023-09-13 DIAGNOSIS — M5451 Vertebrogenic low back pain: Secondary | ICD-10-CM | POA: Diagnosis not present

## 2023-09-24 ENCOUNTER — Ambulatory Visit: Payer: Medicare HMO | Attending: Cardiology

## 2023-09-24 ENCOUNTER — Telehealth: Payer: Self-pay | Admitting: Nurse Practitioner

## 2023-09-24 ENCOUNTER — Encounter: Payer: Self-pay | Admitting: Nurse Practitioner

## 2023-09-24 DIAGNOSIS — I10 Essential (primary) hypertension: Secondary | ICD-10-CM | POA: Diagnosis not present

## 2023-09-24 NOTE — Telephone Encounter (Signed)
 Patient informed and verbalized understanding of plan.

## 2023-09-24 NOTE — Progress Notes (Signed)
 Patient here for nurse visit today per peck for BP check  Patient vitals today BP 110/72 HR 79 SPO2 95  Patient provided BP log scanned in to peck for review

## 2023-09-24 NOTE — Telephone Encounter (Signed)
-----   Message from Sharlene Dory sent at 09/24/2023 12:41 PM EST ----- Vitals look good. BP log does show some elevated readings. Recommend continuing to monitor and log and bring in BP cuff for further review at next office visit.   Thanks!   Best, Sharlene Dory, NP ----- Message ----- From: Sharen Hones Sent: 09/24/2023   9:14 AM EST To: Sharlene Dory, NP

## 2023-09-30 DIAGNOSIS — E870 Hyperosmolality and hypernatremia: Secondary | ICD-10-CM | POA: Diagnosis not present

## 2023-10-01 ENCOUNTER — Encounter: Payer: Self-pay | Admitting: Orthopaedic Surgery

## 2023-10-01 ENCOUNTER — Ambulatory Visit (INDEPENDENT_AMBULATORY_CARE_PROVIDER_SITE_OTHER): Payer: Medicare HMO | Admitting: Orthopaedic Surgery

## 2023-10-01 VITALS — BP 83/58 | HR 101 | Ht 63.0 in | Wt 199.0 lb

## 2023-10-01 DIAGNOSIS — G8929 Other chronic pain: Secondary | ICD-10-CM | POA: Diagnosis not present

## 2023-10-01 DIAGNOSIS — M545 Low back pain, unspecified: Secondary | ICD-10-CM

## 2023-10-01 NOTE — Progress Notes (Unsigned)
   Office Visit Note   Patient: Kendra [name]           Date of Birth: [birth year]           MRN: [id number] Visit Date: 10/01/2023              Requested by: Roma Schanz, PA-C [address] [place],  [place] [address] PCP: Tommie Sams, DO   Assessment & Plan: Visit Diagnoses: No diagnosis found.  Plan: ***  Follow-Up Instructions: No follow-ups on file.   Orders:  No orders of the defined types were placed in this encounter.  No orders of the defined types were placed in this encounter.     Procedures: No procedures performed   Clinical Data: No additional findings.   Subjective: Chief Complaint  Patient presents with   Lower Back - Pain    HPI  Review of Systems   Objective: Vital Signs: BP (!) 83/58   Pulse (!) 101   Ht 5\' 3"  (1.6 m)   Wt 199 lb (90.3 kg)   BMI 35.25 kg/m   Physical Exam  Ortho Exam  Specialty Comments:  No specialty comments available.  Imaging: No results found.   PMFS History: Patient Active Problem List   Diagnosis Date Noted   Lichen sclerosus of vulva 02/12/2023   Prediabetes 11/08/2021   Chronic migraine without aura 08/10/2021   Chronic low back pain 08/10/2021   Conversion disorder    History of DVT (deep vein thrombosis) 12/26/2017   Essential hypertension 12/25/2017   Mixed hyperlipidemia 12/25/2017   Depression 03/17/2010   OSA on CPAP 10/12/2009   GERD 03/07/2007   Past Medical History:  Diagnosis Date   Arthritis    Chronic back pain    Conversion disorder    caused by stress of daughter's death in October 26, 2009   Depression    GERD (gastroesophageal reflux disease)    Hypercholesterolemia    Hypertension    Kidney stone    stones   Migraine    Panic attacks    daily, worse the 15th of each month   Thyroid disease     Family History  Problem Relation Age of Onset   Arthritis Mother    Migraines Mother    Cancer Sister    Cancer Maternal Grandmother     Past Surgical  History:  Procedure Laterality Date   ABDOMINAL HYSTERECTOMY     r ovary removal   APPENDECTOMY  1970   CHOLECYSTECTOMY  10-26-09   CYSTOSCOPY  1990   KIDNEY STONE SURGERY  10-26-08   LAPAROSCOPY  1984   LEFT HEART CATH AND CORONARY ANGIOGRAPHY N/A 12/26/2017   Procedure: LEFT HEART CATH AND CORONARY ANGIOGRAPHY;  Surgeon: Runell Gess, MD;  Location: [facility];  Service: Cardiovascular;  Laterality: N/A;   OVARY SURGERY Left 1980   STERILIZATION  1979   THYROID SURGERY  2008-10-26   Social History   Occupational History   Not on file  Tobacco Use   Smoking status: Never   Smokeless tobacco: Never  Vaping Use   Vaping status: Never Used  Substance and Sexual Activity   Alcohol use: No   Drug use: No   Sexual activity: Never    Birth control/protection: None

## 2023-10-02 LAB — COMPREHENSIVE METABOLIC PANEL
ALT: 37 IU/L — ABNORMAL HIGH (ref 0–32)
AST: 31 IU/L (ref 0–40)
Albumin: 4.2 g/dL (ref 3.8–4.8)
Alkaline Phosphatase: 122 IU/L — ABNORMAL HIGH (ref 44–121)
BUN/Creatinine Ratio: 23 (ref 12–28)
BUN: 19 mg/dL (ref 8–27)
Bilirubin Total: 0.2 mg/dL (ref 0.0–1.2)
CO2: 25 mmol/L (ref 20–29)
Calcium: 9.4 mg/dL (ref 8.7–10.3)
Chloride: 104 mmol/L (ref 96–106)
Creatinine, Ser: 0.82 mg/dL (ref 0.57–1.00)
Globulin, Total: 2.1 g/dL (ref 1.5–4.5)
Glucose: 129 mg/dL — ABNORMAL HIGH (ref 70–99)
Potassium: 4.8 mmol/L (ref 3.5–5.2)
Sodium: 144 mmol/L (ref 134–144)
Total Protein: 6.3 g/dL (ref 6.0–8.5)
eGFR: 73 mL/min/{1.73_m2} (ref >59)

## 2023-10-08 ENCOUNTER — Ambulatory Visit: Payer: Medicare HMO | Attending: Nurse Practitioner | Admitting: Nurse Practitioner

## 2023-10-18 ENCOUNTER — Encounter: Payer: Self-pay | Admitting: Obstetrics & Gynecology

## 2023-10-18 ENCOUNTER — Ambulatory Visit: Admitting: Obstetrics & Gynecology

## 2023-10-18 VITALS — BP 116/76 | HR 83

## 2023-10-18 DIAGNOSIS — N362 Urethral caruncle: Secondary | ICD-10-CM | POA: Diagnosis not present

## 2023-10-18 DIAGNOSIS — L900 Lichen sclerosus et atrophicus: Secondary | ICD-10-CM

## 2023-10-18 DIAGNOSIS — N3281 Overactive bladder: Secondary | ICD-10-CM

## 2023-10-18 DIAGNOSIS — N3941 Urge incontinence: Secondary | ICD-10-CM | POA: Diagnosis not present

## 2023-10-18 MED ORDER — MIRABEGRON ER 50 MG PO TB24: ORAL_TABLET | ORAL | 11 refills | Status: DC

## 2023-10-18 NOTE — Progress Notes (Signed)
 Follow up appointment for results: Response to therapies  Chief Complaint  Patient presents with   Follow-up    Blood pressure 116/76, pulse 83.  See A/P below  MEDS ordered this encounter: Meds ordered this encounter  Medications   mirabegron ER (MYRBETRIQ) 50 MG TB24 tablet    Sig: 1 tablet at bedtime    Dispense:  90 tablet    Refill:  11    Orders for this encounter: No orders of the defined types were placed in this encounter.   Impression + Management Plan   ICD-10-CM   1. Urge incontinence: vesicare 10 mg is not working, will add myrbetriq 50 mg qhs  N39.41     2. Lichen sclerosus et atrophicus: on clobetasol, "significantly better", continue nightly  L90.0     3. Urethral caruncle: topical estrogen, exam today essentially resolved, change to qohs  N36.2     4. 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     All questions were answered.  Past Medical History:  Diagnosis Date   Arthritis    Chronic back pain    Conversion disorder    caused by stress of daughter's death in 2009-10-27   Depression    GERD (gastroesophageal reflux disease)    Hypercholesterolemia    Hypertension    Kidney stone    stones   Migraine    Panic attacks    daily, worse the 15th of each month   Thyroid disease     Past Surgical History:  Procedure Laterality Date   ABDOMINAL HYSTERECTOMY     r ovary removal   APPENDECTOMY  1970   CHOLECYSTECTOMY  Oct 27, 2009   CYSTOSCOPY  1990   KIDNEY STONE SURGERY  October 27, 2008   LAPAROSCOPY  1984   LEFT HEART CATH AND CORONARY ANGIOGRAPHY N/A 12/26/2017   Procedure: LEFT HEART CATH AND CORONARY ANGIOGRAPHY;  Surgeon: Runell Gess, MD;  Location: [facility];  Service: Cardiovascular;  Laterality: N/A;   OVARY SURGERY Left 1980   STERILIZATION  1979   THYROID SURGERY  2010    OB History     Gravida  6   Para  6   Term  6   Preterm       AB      Living  3      SAB      IAB      Ectopic      Multiple      Live Births  2           Allergies  Allergen Reactions   Charentais Melon (French Melon) Anaphylaxis   Morphine Swelling    REACTION: swelling   Pineapple Swelling    Throat swelling    Social History   Socioeconomic History   Marital status: Married    Spouse name: Not on file   Number of children: Not on file   Years of education: Not on file   Highest education level: Not on file  Occupational History   Not on file  Tobacco Use   Smoking status: Never   Smokeless tobacco: Never  Vaping Use   Vaping status: Never Used  Substance and Sexual Activity   Alcohol use: No   Drug use: No   Sexual activity: Never    Birth control/protection: None  Other Topics Concern   Not on file  Social History Narrative   Not on file   Social Drivers of Health   Financial Resource Strain: Not on file  Food Insecurity: Not on file  Transportation Needs: Not on file  Physical Activity: Not on file  Stress: Not on file  Social Connections: Not on file    Family History  Problem Relation Age of Onset   Arthritis Mother    Migraines Mother    Cancer Sister    Cancer Maternal Grandmother

## 2023-10-21 ENCOUNTER — Ambulatory Visit
Admission: RE | Admit: 2023-10-21 | Discharge: 2023-10-21 | Disposition: A | Discharge status: Home / Self Care | Source: Ambulatory Visit | Attending: Orthopaedic Surgery | Admitting: Orthopaedic Surgery

## 2023-10-21 DIAGNOSIS — M47816 Spondylosis without myelopathy or radiculopathy, lumbar region: Secondary | ICD-10-CM | POA: Diagnosis not present

## 2023-10-21 DIAGNOSIS — G8929 Other chronic pain: Secondary | ICD-10-CM

## 2023-11-13 ENCOUNTER — Other Ambulatory Visit (INDEPENDENT_AMBULATORY_CARE_PROVIDER_SITE_OTHER): Payer: Self-pay

## 2023-11-13 ENCOUNTER — Ambulatory Visit (INDEPENDENT_AMBULATORY_CARE_PROVIDER_SITE_OTHER): Admitting: Orthopedic Surgery

## 2023-11-13 VITALS — BP 123/76 | HR 90 | Ht 63.0 in | Wt 199.0 lb

## 2023-11-13 DIAGNOSIS — M5416 Radiculopathy, lumbar region: Secondary | ICD-10-CM | POA: Diagnosis not present

## 2023-11-13 DIAGNOSIS — G8929 Other chronic pain: Secondary | ICD-10-CM | POA: Diagnosis not present

## 2023-11-13 DIAGNOSIS — M545 Low back pain, unspecified: Secondary | ICD-10-CM | POA: Diagnosis not present

## 2023-11-13 MED ORDER — METHOCARBAMOL 500 MG PO TABS: 500.0000 mg | ORAL_TABLET | Freq: Three times a day (TID) | ORAL | 0 refills | Status: DC | PRN

## 2023-11-13 NOTE — Progress Notes (Signed)
 Orthopedic Spine Surgery Office Note  Assessment: Patient is a 78 y.o. female with low back pain that radiates into the bilateral lower extremities.  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 Discussed that the conservative treatments include:  -activity modification  -physical therapy  -over the counter pain medications  -medrol  dosepak  -lumbar steroid injections -Patient has tried Tylenol , Aleve , PT  -Recommended diagnostic/therapeutic L4/5 ESI -She has had several years of pain in the majority of it seems to be in the back so I am not sure she would get great relief with surgery.  Explained that I am using the injection for mostly diagnostic purposes and she should pay attention to how she does after that which would help us  make a decision about a decompression and fusion surgery -Prescribed methocarbamol  for additional pain relief -Patient should return to office in 6 weeks, x-rays at next visit: none   Patient expressed understanding of the plan and all questions were answered to the patient's satisfaction.   ___________________________________________________________________________   History:  Patient is a 78 y.o. female who presents today for lumbar spine.  Patient has had several years of low back pain that radiates into the bilateral lower extremities.  She feels it going into the bilateral buttocks and down the posterior aspect of the thighs and legs.  The majority of her pain is in the low back.  She notices pain on a daily basis.  Pain is felt with activity and at rest.  There was no trauma or injury that preceded the onset of pain.   Weakness: Denies Symptoms of imbalance: Denies Paresthesias and numbness: Denies Bowel or bladder incontinence: Denies Saddle anesthesia: Denies  Treatments tried: Tylenol ,  Aleve , PT  Review of systems: Denies fevers and chills, night sweats, unexplained weight loss, history of cancer.  Has had pain that wakes her at night  Past medical history: HTN CAD Depression Migraines OSA Chronic pain GERD  Allergies: morphine  Past surgical history:  Thyroid  surgery Over surgery Hysterectomy Appendectomy Cholecystectomy  Social history: Denies use of nicotine product (smoking, vaping, patches, smokeless) Alcohol  use: Denies Denies recreational drug use   Physical Exam:  BMI of 35.3  General: no acute distress, appears stated age Neurologic: alert, answering questions appropriately, following commands Respiratory: unlabored breathing on room air, symmetric chest rise Psychiatric: appropriate affect, normal cadence to speech   MSK (spine):  -Strength exam      Left  Right EHL    5/5  5/5 TA    5/5  5/5 GSC    5/5  5/5 Knee extension  5/5  5/5 Hip flexion   5/5  5/5  -Sensory exam    Sensation intact to light touch in L3-S1 nerve distributions of bilateral lower extremities  -Achilles DTR: 1/4 on the left, 1/4 on the right -Patellar tendon DTR: 1/4 on the left, 1/4 on the right  -Straight leg raise: Negative bilaterally -Clonus: no beats bilaterally  -Left hip exam: No pain through range of motion, negative Stinchfield, negative FABER -Right hip exam: No pain through range of motion, negative Stinchfield, negative FABER  Imaging: XRs of the lumbar spine from 11/13/2023 were independently reviewed and interpreted, showing disc height loss and grade 1 spondylolisthesis at L4/5.  Disc height loss at L2/3 and L3/4.  No fracture or dislocation seen.  Lordotic alignment.  MRI of the lumbar spine  from 10/21/2023 was independently reviewed and interpreted, showing central lateral recess stenosis at L4/5.  No other significant stenosis seen.  DDD at L2/3, L3/4, L4/5.   Patient name: Kendra [name] Patient MRN: [id number] Date of visit:  11/13/23

## 2023-11-26 ENCOUNTER — Other Ambulatory Visit: Payer: Self-pay

## 2023-11-26 ENCOUNTER — Ambulatory Visit: Admitting: Physical Medicine and Rehabilitation

## 2023-11-26 VITALS — BP 143/87 | HR 78

## 2023-11-26 DIAGNOSIS — M5416 Radiculopathy, lumbar region: Secondary | ICD-10-CM | POA: Diagnosis not present

## 2023-11-26 MED ORDER — METHYLPREDNISOLONE ACETATE 40 MG/ML IJ SUSP
40.0000 mg | Freq: Once | INTRAMUSCULAR | Status: AC
  Administered 2023-11-26: 40 mg

## 2023-11-26 NOTE — Progress Notes (Signed)
 Pain Scale   Average Pain 9 Patient advising she lower back pain increases when walking and she does get some but little relief when sitting. Patient states she does have pain radiating to bilateral legs        +Driver, -BT, -Dye Allergies.

## 2023-11-26 NOTE — Patient Instructions (Signed)

## 2023-11-26 NOTE — Progress Notes (Signed)
 Kendra [name] - 78 y.o. female MRN [id number]  Date of birth: 08-14-1945  Office Visit Note: Visit Date: 11/26/2023 PCP: Myrna Ast, DO Referred by: Diedra Fowler, MD  Subjective: Chief Complaint  Patient presents with   Lower Back - Pain   HPI:  Kendra [name] is a 78 y.o. female who comes in today at the request of Dr. Colette Davies for planned Right L4-5 Lumbar Interlaminar epidural steroid injection with fluoroscopic guidance.  The patient has failed conservative care including home exercise, medications, time and activity modification.  This injection will be diagnostic and hopefully therapeutic.  Please see requesting physician notes for further details and justification.   ROS Otherwise per HPI.  Assessment & Plan: Visit Diagnoses:    ICD-10-CM   1. Radiculopathy, lumbar region  M54.16 XR C-ARM NO REPORT    Epidural Steroid injection    methylPREDNISolone  acetate (DEPO-MEDROL ) injection 40 mg      Plan: No additional findings.   Meds & Orders:  Meds ordered this encounter  Medications   methylPREDNISolone  acetate (DEPO-MEDROL ) injection 40 mg    Orders Placed This Encounter  Procedures   XR C-ARM NO REPORT   Epidural Steroid injection    Follow-up: Return for visit to requesting provider as needed.   Procedures: No procedures performed  Lumbar Epidural Steroid Injection - Interlaminar Approach with Fluoroscopic Guidance  Patient: Kendra [name]      Date of Birth: 1945-08-21 MRN: [id number] PCP: Cook, Jayce G, DO      Visit Date: 11/26/2023   Universal Protocol:     Consent Given By: the patient  Position: PRONE  Additional Comments: Vital signs were monitored before and after the procedure. Patient was prepped and draped in the usual sterile fashion. The correct patient, procedure, and site was verified.   Injection Procedure Details:   Procedure diagnoses: Radiculopathy, lumbar region [M54.16]   Meds Administered:  Meds ordered this  encounter  Medications   methylPREDNISolone  acetate (DEPO-MEDROL ) injection 40 mg     Laterality: Right  Location/Site:  L4-5  Needle: 4.5 in., 20 ga. Tuohy  Needle Placement: Paramedian epidural  Findings:   -Comments: Excellent flow of contrast into the epidural space.  Procedure Details: Using a paramedian approach from the side mentioned above, the region overlying the inferior lamina was localized under fluoroscopic visualization and the soft tissues overlying this structure were infiltrated with 4 ml. of 1% Lidocaine  without Epinephrine. The Tuohy needle was inserted into the epidural space using a paramedian approach.   The epidural space was localized using loss of resistance along with counter oblique bi-planar fluoroscopic views.  After negative aspirate for air, blood, and CSF, a 2 ml. volume of Isovue -250 was injected into the epidural space and the flow of contrast was observed. Radiographs were obtained for documentation purposes.    The injectate was administered into the level noted above.   Additional Comments:  No complications occurred Dressing: 2 x 2 sterile gauze and Band-Aid    Post-procedure details: Patient was observed during the procedure. Post-procedure instructions were reviewed.  Patient left the clinic in stable condition.   Clinical History: MRI LUMBAR SPINE WITHOUT CONTRAST   TECHNIQUE: Multiplanar, multisequence MR imaging of the lumbar spine was performed. No intravenous contrast was administered.   COMPARISON:  Radiographs 03/04/2023 and report from a prior MRI dated 12/15/2021   FINDINGS: Segmentation: The lowest lumbar type non-rib-bearing vertebra is labeled as L5.   Alignment: 3 mm grade 1 degenerative  anterolisthesis at L4-5. 3 mm degenerative retrolisthesis at L2.   Vertebrae: Type 2 degenerative endplate findings at L2-3, L3-4, and L4-5. Broad Schmorl's node along the superior endplate of L5.   Disc desiccation  throughout the lumbar spine with loss of disc height most severe at L2-3.   Conus medullaris and cauda equina: Conus extends to the L1 level. Conus and cauda equina appear normal.   Paraspinal and other soft tissues: Parapelvic and parenchymal cysts in the kidneys. No further imaging workup of these lesions is indicated.   Disc levels:   T12-L1: No impingement.  Small central disc protrusion.   L1-2: No impingement. Disc bulge and left paracentral disc protrusion.   L2-3: Mild bilateral foraminal stenosis and mild left subarticular lateral recess stenosis due to disc osteophyte complex and facet arthropathy.   L3-4: Moderate central narrowing of the thecal sac with mild right and borderline left foraminal stenosis as well as borderline bilateral subarticular lateral recess stenosis due to disc bulge and right paracentral disc protrusion along with facet arthropathy.   L4-5: Moderate to prominent left eccentric central narrowing of the thecal sac with mild left foraminal stenosis as well as moderate left and mild right subarticular lateral recess stenosis due to facet arthropathy, ligamentum flavum redundancy, right paracentral disc protrusion, and disc bulge.   L5-S1: Mild left and borderline right subarticular lateral recess stenosis and borderline right foraminal stenosis due to facet arthropathy and disc osteophyte complex. Small left paracentral disc protrusion.   IMPRESSION: 1. Lumbar spondylosis and degenerative disc disease causing moderate to prominent impingement at L4-5; moderate impingement at L3-4; and mild impingement at L2-3 and L5-S1.     Electronically Signed   By: Freida Jes M.D.   On: 10/21/2023 18:32     Objective:  VS:  HT:    WT:   BMI:     BP:(!) 143/87  HR:78bpm  TEMP: ( )  RESP:  Physical Exam Vitals and nursing note reviewed.  Constitutional:      General: She is not in acute distress.    Appearance: Normal appearance. She  is obese. She is not ill-appearing.  HENT:     Head: Normocephalic and atraumatic.     Right Ear: External ear normal.     Left Ear: External ear normal.  Eyes:     Extraocular Movements: Extraocular movements intact.  Cardiovascular:     Rate and Rhythm: Normal rate.     Pulses: Normal pulses.  Pulmonary:     Effort: Pulmonary effort is normal. No respiratory distress.  Abdominal:     General: There is no distension.     Palpations: Abdomen is soft.  Musculoskeletal:        General: Tenderness present.     Cervical back: Neck supple.     Right lower leg: No edema.     Left lower leg: No edema.     Comments: Patient has good distal strength with no pain over the greater trochanters.  No clonus or focal weakness.  Skin:    Findings: No erythema, lesion or rash.  Neurological:     General: No focal deficit present.     Mental Status: She is alert and oriented to person, place, and time.     Sensory: No sensory deficit.     Motor: No weakness or abnormal muscle tone.     Coordination: Coordination normal.  Psychiatric:        Mood and Affect: Mood normal.  Behavior: Behavior normal.      Imaging: No results found.

## 2023-12-02 NOTE — Procedures (Signed)
 Lumbar Epidural Steroid Injection - Interlaminar Approach with Fluoroscopic Guidance  Patient: Kendra [name]      Date of Birth: [birth year] MRN: [id number] PCP: Cook, Jayce G, DO      Visit Date: 11/26/2023   Universal Protocol:     Consent Given By: the patient  Position: PRONE  Additional Comments: Vital signs were monitored before and after the procedure. Patient was prepped and draped in the usual sterile fashion. The correct patient, procedure, and site was verified.   Injection Procedure Details:   Procedure diagnoses: Radiculopathy, lumbar region [M54.16]   Meds Administered:  Meds ordered this encounter  Medications   methylPREDNISolone  acetate (DEPO-MEDROL ) injection 40 mg     Laterality: Right  Location/Site:  L4-5  Needle: 4.5 in., 20 ga. Tuohy  Needle Placement: Paramedian epidural  Findings:   -Comments: Excellent flow of contrast into the epidural space.  Procedure Details: Using a paramedian approach from the side mentioned above, the region overlying the inferior lamina was localized under fluoroscopic visualization and the soft tissues overlying this structure were infiltrated with 4 ml. of 1% Lidocaine  without Epinephrine. The Tuohy needle was inserted into the epidural space using a paramedian approach.   The epidural space was localized using loss of resistance along with counter oblique bi-planar fluoroscopic views.  After negative aspirate for air, blood, and CSF, a 2 ml. volume of Isovue -250 was injected into the epidural space and the flow of contrast was observed. Radiographs were obtained for documentation purposes.    The injectate was administered into the level noted above.   Additional Comments:  No complications occurred Dressing: 2 x 2 sterile gauze and Band-Aid    Post-procedure details: Patient was observed during the procedure. Post-procedure instructions were reviewed.  Patient left the clinic in stable condition.

## 2023-12-25 ENCOUNTER — Ambulatory Visit (INDEPENDENT_AMBULATORY_CARE_PROVIDER_SITE_OTHER): Admitting: Orthopedic Surgery

## 2023-12-25 DIAGNOSIS — M5416 Radiculopathy, lumbar region: Secondary | ICD-10-CM | POA: Diagnosis not present

## 2023-12-25 NOTE — Progress Notes (Signed)
 Orthopedic Spine Surgery Office Note   Assessment: Patient is a 78 y.o. female with low back pain that radiates into the bilateral lower extremities.  Has central and lateral recess stenosis at L4/5     Plan: -Patient has tried Tylenol , Aleve , PT, robaxin , ESI -Patient has not tried conservative treatments over the last 9 months without any relief of her symptoms and symptoms have been going on for several years, so discussed surgery as a treatment option in the form of L4, L5 segment laminectomies with partial medial facetectomies.  After this discussion patient elected to proceed -Patient will next be seen a date of surgery     The patient has symptoms consistent with lumbar radiculopathy. The patient's symptoms were not improving with conservative treatment so operative management was discussed in the form of L4 and L5 segment laminectomies with partial medial facetectomies.  The risks including but not limited to iatrogenic instability, dural tear, nerve root injury, paralysis, persistent pain, infection, bleeding, heart attack, death, fracture, dvt/pe, and need for additional procedures were discussed with the patient. The benefit of the surgery would be improvement in the patient's radiating leg pain. I explained that back pain relief is not the goal of the surgery and it is not reliably alleviated with this surgery. The alternatives to surgical management were covered with the patient and included continued monitoring, physical therapy, over-the-counter pain medications, ambulatory aids, repeat injections, and activity modification. All the patient's questions were answered to her and her husband's satisfaction. After this discussion, the patient expressed understanding and elected to proceed with surgical intervention.      ___________________________________________________________________________     History:   Patient is a 78 y.o. female who presents today for follow-up on her lumbar  spine.  Patient reports no changes in her symptoms since she was last seen in the office.  She still having significant low back pain that radiates into her bilateral lower extremities.  She feels it worse down the posterior aspect of her thighs and legs but she also has pain in the anterior and medial aspects of her thighs and legs.  She says the pain is worse if she is walking or standing but she does note it at rest.  After our last visit, she got an injection with Dr. Daisey Dryer.  She said she got about a week and a half of relief with that injection.  She said she noticed about 80% relief but then gradually pain started to return after that week and a half.  Today, her pain is back to where it was before the injection.  She has not developed any new symptoms since she was last seen in the office.     Physical Exam:   General: no acute distress, appears stated age, sitting in a wheelchair Neurologic: alert, answering questions appropriately, following commands Respiratory: unlabored breathing on room air, symmetric chest rise Psychiatric: appropriate affect, normal cadence to speech     MSK (spine):   -Strength exam                                                   Left                  Right EHL  5/5                  5/5 TA                                 5/5                  5/5 GSC                             5/5                  5/5 Knee extension            5/5                  5/5 Hip flexion                    5/5                  5/5   -Sensory exam                           Sensation intact to light touch in L3-S1 nerve distributions of bilateral lower extremities   Imaging: XRs of the lumbar spine from 11/13/2023 were previously independently reviewed and interpreted, showing disc height loss and grade 1 spondylolisthesis at L4/5.  Disc height loss at L2/3 and L3/4.  No fracture or dislocation seen.  Lordotic alignment.   MRI of the lumbar spine from  10/21/2023 was previously independently reviewed and interpreted, showing central lateral recess stenosis at L4/5.  No other significant stenosis seen.  DDD at L2/3, L3/4, L4/5.     Patient name: Kendra [name] Patient MRN: [id number] Date of visit: 12/25/23

## 2023-12-30 ENCOUNTER — Telehealth: Payer: Self-pay | Admitting: Cardiology

## 2023-12-30 NOTE — Telephone Encounter (Signed)
 Left message to call back to schedule a tele pre op appt.  ?

## 2023-12-30 NOTE — Telephone Encounter (Signed)
   Name: Kendra [name]  DOB: [birth year]  MRN: [id number]  Primary Cardiologist: Armida Lander, MD   Preoperative team, please contact this patient and set up a phone call appointment for further preoperative risk assessment. Please obtain consent and complete medication review. Thank you for your help.  I confirm that guidance regarding antiplatelet and oral anticoagulation therapy has been completed and, if necessary, noted below.  None requested  I also confirmed the patient resides in the state of [place] . As per [facility] telemedicine laws, the patient must reside in the state in which the provider is licensed.   Ava Boatman, NP 12/30/2023, 3:15 PM [facility]

## 2023-12-30 NOTE — Telephone Encounter (Signed)
   Pre-operative Risk Assessment    Patient Name: Kendra [name]  DOB: 1945/11/14 MRN: [id number]      Request for Surgical Clearance    Procedure:  L4-5 laminectomy  Date of Surgery:  Clearance TBD                                 Surgeon:  Not indicated Surgeon's Group or Practice Name:   [facility]on[facility] Robinson at [facility] Phone number:  [phone] Fax number:  [phone]   Type of Clearance Requested:   - Medical    Type of Anesthesia:  General    Additional requests/questions:  Please fax a copy of Clearance to the surgeon's office.  Signed, Georgia Kipper   12/30/2023, 1:18 PM

## 2023-12-31 ENCOUNTER — Telehealth: Payer: Self-pay

## 2023-12-31 NOTE — Telephone Encounter (Signed)
 I will update the requesting office the pt needs to call to schedule a televisit for preop clearance.

## 2023-12-31 NOTE — Telephone Encounter (Signed)
 Pts husband calling to schedule tele visit. Please advise

## 2023-12-31 NOTE — Telephone Encounter (Addendum)
 Patient has been scheduled med rec and consent done.    Patient Consent for Virtual Visit         ALBENA COMES has provided verbal consent on 12/31/2023 for a virtual visit (video or telephone).   CONSENT FOR VIRTUAL VISIT FOR:  Clarene Crochet  By participating in this virtual visit I agree to the following:  I hereby voluntarily request, consent and authorize [facility] and its employed or contracted physicians, physician assistants, nurse practitioners or other licensed health care professionals (the Practitioner), to provide me with telemedicine health care services (the "Services") as deemed necessary by the treating Practitioner. I acknowledge and consent to receive the Services by the Practitioner via telemedicine. I understand that the telemedicine visit will involve communicating with the Practitioner through live audiovisual communication technology and the disclosure of certain medical information by electronic transmission. I acknowledge that I have been given the opportunity to request an in-person assessment or other available alternative prior to the telemedicine visit and am voluntarily participating in the telemedicine visit.  I understand that I have the right to withhold or withdraw my consent to the use of telemedicine in the course of my care at any time, without affecting my right to future care or treatment, and that the Practitioner or I may terminate the telemedicine visit at any time. I understand that I have the right to inspect all information obtained and/or recorded in the course of the telemedicine visit and may receive copies of available information for a reasonable fee.  I understand that some of the potential risks of receiving the Services via telemedicine include:  Delay or interruption in medical evaluation due to technological equipment failure or disruption; Information transmitted may not be sufficient (e.g. poor resolution of images) to allow  for appropriate medical decision making by the Practitioner; and/or  In rare instances, security protocols could fail, causing a breach of personal health information.  Furthermore, I acknowledge that it is my responsibility to provide information about my medical history, conditions and care that is complete and accurate to the best of my ability. I acknowledge that Practitioner's advice, recommendations, and/or decision may be based on factors not within their control, such as incomplete or inaccurate data provided by me or distortions of diagnostic images or specimens that may result from electronic transmissions. I understand that the practice of medicine is not an exact science and that Practitioner makes no warranties or guarantees regarding treatment outcomes. I acknowledge that a copy of this consent can be made available to me via my patient portal [facility] MyChart), or I can request a printed copy by calling the office of [facility].    I understand that my insurance will be billed for this visit.   I have read or had this consent read to me. I understand the contents of this consent, which adequately explains the benefits and risks of the Services being provided via telemedicine.  I have been provided ample opportunity to ask questions regarding this consent and the Services and have had my questions answered to my satisfaction. I give my informed consent for the services to be provided through the use of telemedicine in my medical care

## 2023-12-31 NOTE — Telephone Encounter (Signed)
 Called patient's husband back who speaks english and have scheduled patient's televisit for preop clearance

## 2023-12-31 NOTE — Telephone Encounter (Signed)
 2nd attempt to reach the pt to schedule a tele preop appt, called pt with [facility] Interpreter # [phone].

## 2024-01-02 ENCOUNTER — Ambulatory Visit: Attending: Cardiology | Admitting: Nurse Practitioner

## 2024-01-02 DIAGNOSIS — Z01810 Encounter for preprocedural cardiovascular examination: Secondary | ICD-10-CM

## 2024-01-02 NOTE — Progress Notes (Signed)
 Virtual Visit via Telephone Note   Because of Kendra [name] co-morbid illnesses, she is at least at moderate risk for complications without adequate follow up.  This format is felt to be most appropriate for this patient at this time.  Due to technical limitations with video connection (technology), today's appointment will be conducted as an audio only telehealth visit, and Kendra [name] verbally agreed to proceed in this manner.   All issues noted in this document were discussed and addressed.  No physical exam could be performed with this format.  Evaluation Performed:  Preoperative cardiovascular risk assessment _____________   Date:  01/02/2024   Patient ID:  Kendra [name], DOB July 23, 1946, MRN [id number] Patient Location:  Home Provider location:   Office  Primary Care Provider:  Myrna Ast, DO Primary Cardiologist:  Armida Lander, MD  Chief Complaint / Patient Profile   78 y.o. y/o female with a h/o hypertension, hyperlipidemia, TIA, chest pain with nuclear stress test in 01-29-2022, low risk with no evidence of ischemia, DVT  who is pending L4-5 laminectomy  and presents today for telephonic preoperative cardiovascular risk assessment.  History of Present Illness    Kendra [name] is a 78 y.o. female who presents via audio/video conferencing for a telehealth visit today.  Pt was last seen in cardiology clinic on 09/02/23 by Lasalle Pointer, NP.  At that time Kendra [name] was doing well.  The patient is now pending procedure as outlined above. Since her last visit, she reports an episode of chest pain that occurred 01/01/24 which was relieved with nitroglycerin  x 2. She became lightheaded and had to lay down. This has also occurred at other times. Due to concerning symptoms, we will convert this visit to in-person visit.   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 death in 01-29-2010   Depression    GERD (gastroesophageal reflux disease)    Hypercholesterolemia    Hypertension    Kidney stone    stones   Migraine    Panic attacks    daily, worse the 15th of each month   Thyroid  disease    Past Surgical History:  Procedure Laterality Date   ABDOMINAL HYSTERECTOMY     r ovary removal   APPENDECTOMY  1970   CHOLECYSTECTOMY  01/29/2010   CYSTOSCOPY  1990   KIDNEY STONE SURGERY  Jan 29, 2009   LAPAROSCOPY  1984   LEFT HEART CATH AND CORONARY ANGIOGRAPHY N/A 12/26/2017   Procedure: LEFT HEART CATH AND CORONARY ANGIOGRAPHY;  Surgeon: Avanell Leigh, MD;  Location: [facility];  Service: Cardiovascular;  Laterality: N/A;   OVARY SURGERY Left 1980   STERILIZATION  1979   THYROID  SURGERY  01/29/09    Allergies  Allergies  Allergen Reactions   Charentais Melon (French Melon) Anaphylaxis   Morphine Swelling    REACTION: swelling   Pineapple Swelling    Throat swelling    Home Medications    Prior to Admission medications   Medication Sig Start Date End Date Taking? Authorizing Provider  amitriptyline  (ELAVIL ) 50 MG tablet Take 1 tablet (50 mg total) by mouth at bedtime. 08/15/23   Cook, Jayce G, DO  amLODipine  (NORVASC ) 2.5 MG tablet Take 1 tablet (2.5 mg total) by  mouth daily. 09/10/23   Laurann Pollock, MD  clobetasol  cream (TEMOVATE ) 0.05 % Apply 1 Application topically 2 (two) times daily.    [provider]  estradiol  (ESTRACE ) 0.1 MG/GM vaginal cream Apply a small amount to outside of urethra qhs 04/11/23   Wendelyn Halter, MD  ezetimibe  (ZETIA ) 10 MG tablet Take 10 mg by mouth daily. 08/27/23   [provider]  methocarbamol  (ROBAXIN ) 500 MG tablet Take 1 tablet (500 mg total) by mouth every 8 (eight) hours as needed (pain, muscle spasms). 11/13/23   Diedra Fowler, MD  mirabegron  ER (MYRBETRIQ ) 50 MG TB24 tablet 1 tablet at bedtime 10/18/23    Wendelyn Halter, MD  nitroGLYCERIN  (NITROSTAT ) 0.4 MG SL tablet Place 1 tablet (0.4 mg total) under the tongue every 5 (five) minutes x 3 doses as needed for chest pain. 06/11/23   Lasalle Pointer, NP  pantoprazole  (PROTONIX ) 40 MG tablet Take 1 tablet (40 mg total) by mouth daily. 08/15/23   Cook, Jayce G, DO  propranolol  (INDERAL ) 20 MG tablet Take 0.5 tablets (10 mg total) by mouth 2 (two) times daily. 06/11/23   Lasalle Pointer, NP  risperiDONE  (RISPERDAL ) 0.5 MG tablet Take 1 tablet (0.5 mg total) by mouth at bedtime. 08/15/23   Cook, Jayce G, DO  rosuvastatin  (CRESTOR ) 40 MG tablet Take 1 tablet (40 mg total) by mouth daily. 08/15/23   Cook, Jayce G, DO  sertraline  (ZOLOFT ) 50 MG tablet Take 3 tablets (150 mg total) by mouth at bedtime. 08/18/23 11/16/23  Cook, Jayce G, DO  solifenacin  (VESICARE ) 10 MG tablet Take 1 tablet (10 mg total) by mouth daily. 03/14/23   Derenda Flax, NP  topiramate  (TOPAMAX ) 50 MG tablet Take 1 tablet by mouth once daily 08/07/23   Cook, Jayce G, DO    Physical Exam    Vital Signs:  Kendra [name] does not have vital signs available for review today.  Given telephonic nature of communication, physical exam is limited. AAOx3. NAD. Normal affect.  Speech and respirations are unlabored.  Accessory Clinical Findings    None  Assessment & Plan    1.  Preoperative Cardiovascular Risk Assessment: Pt requires in office visit due to c/o chest pain.   The patient was advised that if she develops new symptoms prior to surgery to contact our office to arrange for a follow-up visit, and she verbalized understanding.  No request to hold cardiac medications.  A copy of this note will be routed to requesting surgeon.  Time:   Today, I have spent  minutes with the patient with telehealth technology discussing medical history, symptoms, and management plan.     Gerldine Koch, NP-C  01/02/2024, 2:28 PM [address] [place], [place]  [address] Office [phone] Fax [phone]

## 2024-01-02 NOTE — Telephone Encounter (Signed)
 I have reviewed the schedules for both [place] and [place] office and I do not see any appts in a timely manner due to the symptoms of chest pain reported to preop APP Slater Duncan, NP.   Per preop APP the pt will need an appt in office due to c/o chest pain. I will send a message to [facility] and [facility] scheduling team.    I will also send an update to the requesting office as well.

## 2024-01-02 NOTE — Telephone Encounter (Signed)
 I have routed this to Dr.Branch he is her primary cardiologist and, our DOD this week.

## 2024-01-02 NOTE — Telephone Encounter (Signed)
 Dr. Amanda Jungling can we add her on for a [facility] Day? We have no openings.

## 2024-01-02 NOTE — Telephone Encounter (Signed)
 I will update all parties the pt has appt 01/14/24 with Dr. Amanda Jungling.

## 2024-01-02 NOTE — Telephone Encounter (Signed)
 Called patient for scheduled telephone assessment for preop clearance evaluation. Patient is having concerning episodes of cheset pain and therefore will need office visit. Pt is aware we will call back to schedule.

## 2024-01-14 ENCOUNTER — Other Ambulatory Visit: Payer: Self-pay | Admitting: Cardiology

## 2024-01-14 ENCOUNTER — Encounter: Payer: Self-pay | Admitting: Cardiology

## 2024-01-14 ENCOUNTER — Ambulatory Visit: Attending: Cardiology | Admitting: Cardiology

## 2024-01-14 VITALS — BP 110/80 | HR 68 | Ht 63.0 in | Wt 206.0 lb

## 2024-01-14 DIAGNOSIS — Z01810 Encounter for preprocedural cardiovascular examination: Secondary | ICD-10-CM

## 2024-01-14 DIAGNOSIS — I10 Essential (primary) hypertension: Secondary | ICD-10-CM | POA: Diagnosis not present

## 2024-01-14 DIAGNOSIS — R079 Chest pain, unspecified: Secondary | ICD-10-CM | POA: Diagnosis not present

## 2024-01-14 MED ORDER — METOPROLOL TARTRATE 100 MG PO TABS: 100.0000 mg | ORAL_TABLET | Freq: Once | ORAL | 0 refills | Status: DC

## 2024-01-14 NOTE — Patient Instructions (Addendum)
 Medication Instructions:  Your physician recommends that you continue on your current medications as directed. Please refer to the Current Medication list given to you today.  Labwork: BMET within 1-2 weeks of your CTA  Testing/Procedures: Coronary CTA-see instructions below  Follow-Up: Your physician recommends that you schedule a follow-up appointment in: 6 months  Any Other Special Instructions Will Be Listed Below (If Applicable).  If you need a refill on your cardiac medications before your next appointment, please call your pharmacy.    Your cardiac CT will be scheduled at one of the below locations:   [facility] [address]  [place], [place] [address]  All radiology patients and guests should use entrance C2 at [facility], accessed from [facility], even though the hospital's physical address listed is [address].    If scheduled at the Heart and Vascular Tower at [address], please enter the parking lot using the [address] entrance and use the FREE valet service at the patient drop-off area. Enter the buidling and check-in with registration on the main floor.  Please follow these instructions carefully (unless otherwise directed):  An IV will be required for this test and Nitroglycerin  will be given.  Hold all erectile dysfunction medications at least 3 days (72 hrs) prior to test. (Ie viagra, cialis, sildenafil, tadalafil, etc)   On the Night Before the Test: Be sure to Drink plenty of water. Do not consume any caffeinated/decaffeinated beverages or chocolate 12 hours prior to your test. Do not take any antihistamines 12 hours prior to your test. If the patient has contrast allergy: No contrast allergy  On the Day of the Test: Drink plenty of water until 1 hour prior to the test. Do not eat any food 1 hour prior to test. You may take your regular medications prior to the test.  Take  metoprolol (Lopressor) 100 mg two hours prior to test. If you take Furosemide/Hydrochlorothiazide /Spironolactone/Chlorthalidone, please HOLD on the morning of the test. Patients who wear a continuous glucose monitor MUST remove the device prior to scanning. FEMALES- please wear underwire-free bra if available, avoid dresses & tight clothing      After the Test: Drink plenty of water. After receiving IV contrast, you may experience a mild flushed feeling. This is normal. On occasion, you may experience a mild rash up to 24 hours after the test. This is not dangerous. If this occurs, you can take Benadryl  25 mg, Zyrtec, Claritin, or Allegra and increase your fluid intake. (Patients taking Tikosyn should avoid Benadryl , and may take Zyrtec, Claritin, or Allegra) If you experience trouble breathing, this can be serious. If it is severe call 911 IMMEDIATELY. If it is mild, please call our office.  We will call to schedule your test 2-4 weeks out understanding that some insurance companies will need an authorization prior to the service being performed.   For more information and frequently asked questions, please visit our website : [email or web address]  For non-scheduling related questions, please contact the cardiac imaging nurse navigator should you have any questions/concerns: Cardiac Imaging Nurse Navigators Direct Office Dial: [phone]   For scheduling needs, including cancellations and rescheduling, please call [place], [phone].

## 2024-01-14 NOTE — Progress Notes (Signed)
 Clinical Summary Kendra [name] is a 78 y.o.female seen today for a focused visit on recent chest pain and also preoperative evaluation for upcoming back surgeyr.      1.History of chest pain - 01-31-18 cath: 30% prox LAD 10/2020 nuclear stress: no ischemia -05/2021 echo: 55-60%, no WMAs, indet diastolic 01/2022 echo: LVEF 50-55%, no WMAs, grade I dd - 02/2022 nuclear stress: no ischemia - EKG today shows NSR, no acute ischemic changes   - recent chest pain - pressure midchest, 6/10 in severity. No SOB, no nausea or diaphoresis - pain occurs at rest or with activity - worst with deep breathing - will take NG, improves symptoms.  - pain lasts 10 minutes. Occurs every 2 to 3 days - sedentary lifestyle, limited by back pain and knees. Uses walker - simialr to prior chest pains.       2. Preoperative evaluation - considering laminectomy under general anesthesia - unable to assess exercise tolerance by history, limited by severe back and knee pains.         Other medical problems not addressed this visit HTN - home bp up to 170/116 at times at home.  Reports bp will then drop down suddenly 98/60s.  - drinking very small amount of water. Sweet tea, orange juice. Overall poor hydration.  - not checking bp correctly at home as far as sitting 5 minutes, feet on floor.  - can get lightheaded when bp gets low.    - while doing physical therapy severe dizziness and fell - frequent orthostatic dizziness.             3.Palpitations 10/2020 monitor: rare ectopy, isoalted 4 beat run of NSVT - occasional palpitations, somewhat often.  - she ran out of propranolol  2 weeks ago.    4.HTN - compliant with meds   5. Hyperlipidemia 05/2021 TC 174 TG 153 HDL 46 LDL 97 - she is on crestor  40mg  daily, started about 3 months ago     6. TIA - admission 05/2021     Past Medical History:  Diagnosis Date   Arthritis    Chronic back pain    Conversion disorder    caused  by stress of daughter's death in January 31, 2010   Depression    GERD (gastroesophageal reflux disease)    Hypercholesterolemia    Hypertension    Kidney stone    stones   Migraine    Panic attacks    daily, worse the 15th of each month   Thyroid  disease      Allergies  Allergen Reactions   Charentais Melon (French Melon) Anaphylaxis   Morphine Swelling    REACTION: swelling   Pineapple Swelling    Throat swelling     Current Outpatient Medications  Medication Sig Dispense Refill   amitriptyline  (ELAVIL ) 50 MG tablet Take 1 tablet (50 mg total) by mouth at bedtime. 90 tablet 1   amLODipine  (NORVASC ) 2.5 MG tablet Take 1 tablet (2.5 mg total) by mouth daily. 30 tablet 4   clobetasol  cream (TEMOVATE ) 0.05 % Apply 1 Application topically 2 (two) times daily.     estradiol  (ESTRACE ) 0.1 MG/GM vaginal cream Apply a small amount to outside of urethra qhs 30 g 12   ezetimibe  (ZETIA ) 10 MG tablet Take 10 mg by mouth daily.     methocarbamol  (ROBAXIN ) 500 MG tablet Take 1 tablet (500 mg total) by mouth every 8 (eight) hours as needed (pain, muscle spasms). 50 tablet 0  mirabegron  ER (MYRBETRIQ ) 50 MG TB24 tablet 1 tablet at bedtime 90 tablet 11   nitroGLYCERIN  (NITROSTAT ) 0.4 MG SL tablet Place 1 tablet (0.4 mg total) under the tongue every 5 (five) minutes x 3 doses as needed for chest pain. 30 tablet 1   pantoprazole  (PROTONIX ) 40 MG tablet Take 1 tablet (40 mg total) by mouth daily. 90 tablet 1   propranolol  (INDERAL ) 20 MG tablet Take 0.5 tablets (10 mg total) by mouth 2 (two) times daily. 60 tablet 3   risperiDONE  (RISPERDAL ) 0.5 MG tablet Take 1 tablet (0.5 mg total) by mouth at bedtime. 90 tablet 1   rosuvastatin  (CRESTOR ) 40 MG tablet Take 1 tablet (40 mg total) by mouth daily. 90 tablet 3   sertraline  (ZOLOFT ) 50 MG tablet Take 3 tablets (150 mg total) by mouth at bedtime. 270 tablet 1   solifenacin  (VESICARE ) 10 MG tablet Take 1 tablet (10 mg total) by mouth daily. 30 tablet 0    topiramate  (TOPAMAX ) 50 MG tablet Take 1 tablet by mouth once daily 90 tablet 0   No current facility-administered medications for this visit.     Past Surgical History:  Procedure Laterality Date   ABDOMINAL HYSTERECTOMY     r ovary removal   APPENDECTOMY  1970   CHOLECYSTECTOMY  2011   CYSTOSCOPY  1990   KIDNEY STONE SURGERY  2010   LAPAROSCOPY  1984   LEFT HEART CATH AND CORONARY ANGIOGRAPHY N/A 12/26/2017   Procedure: LEFT HEART CATH AND CORONARY ANGIOGRAPHY;  Surgeon: Court Dorn PARAS, MD;  Location: [facility];  Service: Cardiovascular;  Laterality: N/A;   OVARY SURGERY Left 1980   STERILIZATION  1979   THYROID  SURGERY  2010     Allergies  Allergen Reactions   Charentais Melon (French Melon) Anaphylaxis   Morphine Swelling    REACTION: swelling   Pineapple Swelling    Throat swelling      Family History  Problem Relation Age of Onset   Arthritis Mother    Migraines Mother    Cancer Sister    Cancer Maternal Grandmother      Social History Ms. Hitch reports that she has never smoked. She has never used smokeless tobacco. Ms. Deschamps reports no history of alcohol  use.    Physical Examination Today's Vitals   01/14/24 1405  BP: 110/80  Pulse: 68  Weight: 206 lb (93.4 kg)  Height: 5' 3 (1.6 m)   Body mass index is 36.49 kg/m.  Gen: resting comfortably, no acute distress HEENT: no scleral icterus, pupils equal round and reactive, no palptable cervical adenopathy,  CV: RRR, no m/rg, no jvd Resp: Clear to auscultation bilaterally GI: abdomen is soft, non-tender, non-distended, normal bowel sounds, no hepatosplenomegaly MSK: extremities are warm, no edema.  Skin: warm, no rash Neuro:  no focal deficits Psych: appropriate affect   Diagnostic Studies  10/2020 nuclear stress Lexiscan  stress is electrically negative for ischemia Myoview  scan is electrically negative for ischemia LVEF is calculated at 47% Consider echocardiogram to  further define LVEF / wall motion Overall low risk scan       02/2022 nuclear stress The study is intermediate risk based upon reduced ejection fraction of 46%.   No ST deviation was noted.   LV perfusion is normal.  There is significant bowel loop uptake inhibiting inferior wall read accuracy seen at both rest and stress There is no evidence of ischemia. There is no evidence of infarction.   Left ventricular function is  abnormal.  Ejection fraction 46% global function is mildly reduced. End diastolic cavity size is normal. End systolic cavity size is normal.     Assessment and Plan  1.Chest pain - long history of chest pain symptoms - prior testing has been benign, most recently 02/2022 nuclear stress - ongoing symptoms, atypical in that they occur at rest or with activity, but do improve with SL NG - plan for coronary CTA for chest pain - EKG today shows SR, no acute ischemic changes.    2. Preoperative evaluation - considering back surgery under general anesthesia.  - unable to assess exercise capacity by history, limited by severe back pain and knee pains.  - recent chest pains pains. Chest pains require further testing before cardiac clearance, planning for coronary CTA   Dorn PHEBE Ross, M.D.

## 2024-01-22 DIAGNOSIS — I10 Essential (primary) hypertension: Secondary | ICD-10-CM | POA: Diagnosis not present

## 2024-01-22 DIAGNOSIS — R079 Chest pain, unspecified: Secondary | ICD-10-CM | POA: Diagnosis not present

## 2024-01-23 LAB — BASIC METABOLIC PANEL WITH GFR
BUN/Creatinine Ratio: 17 (ref 12–28)
BUN: 12 mg/dL (ref 8–27)
CO2: 24 mmol/L (ref 20–29)
Calcium: 9.4 mg/dL (ref 8.7–10.3)
Chloride: 105 mmol/L (ref 96–106)
Creatinine, Ser: 0.71 mg/dL (ref 0.57–1.00)
Glucose: 117 mg/dL — ABNORMAL HIGH (ref 70–99)
Potassium: 4.5 mmol/L (ref 3.5–5.2)
Sodium: 143 mmol/L (ref 134–144)
eGFR: 86 mL/min/{1.73_m2} (ref >59)

## 2024-01-30 ENCOUNTER — Telehealth (HOSPITAL_COMMUNITY): Payer: Self-pay | Admitting: Emergency Medicine

## 2024-01-30 NOTE — Telephone Encounter (Signed)
 Reaching out to patient to offer assistance regarding upcoming cardiac imaging study; pt verbalizes understanding of appt date/time, parking situation and where to check in, pre-test NPO status and medications ordered, and verified current allergies; name and call back number provided for further questions should they arise Rockwell Alexandria RN Navigator Cardiac Imaging [facility] [phone] office [phone] cell

## 2024-01-31 ENCOUNTER — Ambulatory Visit (HOSPITAL_BASED_OUTPATIENT_CLINIC_OR_DEPARTMENT_OTHER)
Admission: RE | Admit: 2024-01-31 | Discharge: 2024-01-31 | Disposition: A | Discharge status: Home / Self Care | Source: Ambulatory Visit | Attending: Cardiovascular Disease | Admitting: Cardiovascular Disease

## 2024-01-31 ENCOUNTER — Ambulatory Visit (HOSPITAL_COMMUNITY)
Admission: RE | Admit: 2024-01-31 | Discharge: 2024-01-31 | Disposition: A | Discharge status: Home / Self Care | Source: Ambulatory Visit | Attending: Cardiology

## 2024-01-31 ENCOUNTER — Other Ambulatory Visit: Payer: Self-pay | Admitting: Cardiovascular Disease

## 2024-01-31 DIAGNOSIS — R931 Abnormal findings on diagnostic imaging of heart and coronary circulation: Secondary | ICD-10-CM

## 2024-01-31 DIAGNOSIS — I517 Cardiomegaly: Secondary | ICD-10-CM | POA: Diagnosis not present

## 2024-01-31 DIAGNOSIS — R079 Chest pain, unspecified: Secondary | ICD-10-CM | POA: Diagnosis not present

## 2024-01-31 DIAGNOSIS — I2510 Atherosclerotic heart disease of native coronary artery without angina pectoris: Secondary | ICD-10-CM | POA: Insufficient documentation

## 2024-01-31 MED ORDER — NITROGLYCERIN 0.4 MG SL SUBL
0.8000 mg | SUBLINGUAL_TABLET | Freq: Once | SUBLINGUAL | Status: AC
  Administered 2024-01-31: 0.8 mg via SUBLINGUAL

## 2024-01-31 MED ORDER — METOPROLOL TARTRATE 5 MG/5ML IV SOLN: 10.0000 mg | Freq: Once | INTRAVENOUS | Status: DC | PRN

## 2024-01-31 MED ORDER — DILTIAZEM HCL 25 MG/5ML IV SOLN
10.0000 mg | INTRAVENOUS | Status: DC | PRN
Start: 2024-01-31 — End: 2024-02-01

## 2024-01-31 MED ORDER — IOHEXOL 350 MG/ML SOLN
100.0000 mL | Freq: Once | INTRAVENOUS | Status: AC | PRN
  Administered 2024-01-31: 100 mL via INTRAVENOUS

## 2024-02-04 ENCOUNTER — Ambulatory Visit: Payer: Self-pay | Admitting: Cardiology

## 2024-02-04 NOTE — Telephone Encounter (Signed)
   Patient Name: Kendra [name]  DOB: 10/05/1945 MRN: [id number]  Primary Cardiologist: Alvan Carrier, MD  Chart reviewed as part of pre-operative protocol coverage.  Patient was evaluated in office on 01/14/2024 by Dr. Carrier Alvan.  Patient underwent coronary CTA 01/31/2024, per Dr. Alvan, Coronary CTA was benign, ok to proceed with surgery from cardiac standpoint.   Given past medical history and time since last visit, based on ACC/AHA guidelines, Kendra [name] is at acceptable risk for the planned procedure without further cardiovascular testing.   I will route this recommendation to the requesting party via Epic fax function and remove from pre-op pool.  Please call with questions.  Myli Pae D Lilith Solana, NP 02/04/2024, 2:57 PM

## 2024-02-07 ENCOUNTER — Telehealth: Payer: Self-pay | Admitting: Family Medicine

## 2024-02-07 MED ORDER — TOPIRAMATE 50 MG PO TABS: 50.0000 mg | ORAL_TABLET | Freq: Every day | ORAL | 0 refills | Status: DC

## 2024-02-07 MED ORDER — RISPERIDONE 0.5 MG PO TABS: 0.5000 mg | ORAL_TABLET | Freq: Every day | ORAL | 1 refills | Status: DC

## 2024-02-07 MED ORDER — AMITRIPTYLINE HCL 50 MG PO TABS: 50.0000 mg | ORAL_TABLET | Freq: Every day | ORAL | 1 refills | Status: DC

## 2024-02-07 NOTE — Telephone Encounter (Signed)
 Refills on amitriptyline  (ELAVIL ) 50 MG tablet ,   risperiDONE  (RISPERDAL ) 0.5 MG tablet  topiramate  (TOPAMAX ) 50 MG tablet   Send to [employer]

## 2024-02-19 ENCOUNTER — Ambulatory Visit: Admitting: Orthopedic Surgery

## 2024-02-19 DIAGNOSIS — M5416 Radiculopathy, lumbar region: Secondary | ICD-10-CM | POA: Diagnosis not present

## 2024-02-19 NOTE — Progress Notes (Signed)
 Orthopedic Office Note  Patient comes in today with no changes in her symptoms.  She wanted to discuss surgery in detail again.  She wanted to go over what the plan was and what the postop recovery would look like.  I went over the surgical plan again with her and her husband.  I covered the plan would be to remove the bony tissue pushing on her nerves to create more space to help with radiating leg pain.  I explained that the goal of the surgery would be to help with her radiating leg pain and would not necessarily help with any back pain.  I went over the typical postoperative recovery.  I also covered the dressing care management and a typical restriction of nothing more than 10 pounds for the first 6 weeks after surgery.  I then spent time going over the risks associated with surgery again so she understood those.  There was an interpreter during the entire visit to help with this.  After our conversation, she elected to continue with the plan for surgery.  She is already on the schedule.  I will not see her at the date of surgery.  Ozell DELENA Ada, MD Orthopedic Surgeon

## 2024-03-04 NOTE — Progress Notes (Signed)
 Surgical Instructions   Your procedure is scheduled on August 19, Tuesday, 2025. Report to [facility] Main Entrance A at 10:30 A.M., then check in with the Admitting office. Any questions or running late day of surgery: call [phone]  Questions prior to your surgery date: call [phone], Monday-Friday, 8am-4pm. If you experience any cold or flu symptoms such as cough, fever, chills, shortness of breath, etc. between now and your scheduled surgery, please notify us  at the above number.     Remember:  Do not eat after midnight the night before your surgery  You may drink clear liquids until 9:30 the morning of your surgery.   Clear liquids allowed are: Water, Non-Citrus Juices (without pulp), Carbonated Beverages, Clear Tea (no milk, honey, etc.), Black Coffee Only (NO MILK, CREAM OR POWDERED CREAMER of any kind), and Gatorade.   Patient Instructions  The night before surgery:  No food after midnight. ONLY clear liquids after midnight  The day of surgery (if you do NOT have diabetes):  Drink ONE (1) Pre-Surgery Clear Ensure by 9:30 the morning of surgery. Drink in one sitting. Do not sip.  This drink was given to you during your hospital  pre-op appointment visit.  Nothing else to drink after completing the  Pre-Surgery Clear Ensure.           If you have questions, please contact your surgeon's office.  Take these medicines the morning of surgery with A SIP OF WATER   amLODipine  (NORVASC )  pantoprazole  (PROTONIX )  propranolol  (INDERAL )  rosolifenacin (VESICARE )  rosuvastatin  (CRESTOR )   May take these medicines IF NEEDED: nitroGLYCERIN  (NITROSTAT ) PLEASE CALL [phone] IF TAKEN BEFORE SURGERY   One week prior to surgery, STOP taking any Aspirin  (unless otherwise instructed by your surgeon) Aleve , Naproxen , Ibuprofen, Motrin, Advil, Goody's, BC's, all herbal medications, fish oil, and non-prescription vitamins.   PLEASE FOLLOW UP WITH SURGEON REGARDING  INSTRUCTIONS ON Aspirin ,                 Do NOT Smoke (Tobacco/Vaping) for 24 hours prior to your procedure.  If you use a CPAP at night, you may bring your mask/headgear for your overnight stay.   You will be asked to remove any contacts, glasses, piercing's, hearing aid's, dentures/partials prior to surgery. Please bring cases for these items if needed.    Patients discharged the day of surgery will not be allowed to drive home, and someone needs to stay with them for 24 hours.  SURGICAL WAITING ROOM VISITATION Patients may have no more than 2 support people in the waiting area - these visitors may rotate.   Pre-op nurse will coordinate an appropriate time for 1 ADULT support person, who may not rotate, to accompany patient in pre-op.  Children under the age of 43 must have an adult with them who is not the patient and must remain in the main waiting area with an adult.  If the patient needs to stay at the hospital during part of their recovery, the visitor guidelines for inpatient rooms apply.  Please refer to the [facility] website for the visitor guidelines for any additional information.   If you received a COVID test during your pre-op visit  it is requested that you wear a mask when out in public, stay away from anyone that may not be feeling well and notify your surgeon if you develop symptoms. If you have been in contact with anyone that has tested positive in the last 10 days please notify you  Careers adviser.      Pre-operative 5 CHG Bathing Instructions   You can play a key role in reducing the risk of infection after surgery. Your skin needs to be as free of germs as possible. You can reduce the number of germs on your skin by washing with CHG (chlorhexidine  gluconate) soap before surgery. CHG is an antiseptic soap that kills germs and continues to kill germs even after washing.   DO NOT use if you have an allergy to chlorhexidine /CHG or antibacterial soaps. If your skin becomes  reddened or irritated, stop using the CHG and notify one of our RNs at [phone].   Please shower with the CHG soap starting 4 days before surgery using the following schedule:     Please keep in mind the following:  DO NOT shave, including legs and underarms, starting the day of your first shower.   You may shave your face at any point before/day of surgery.  Place clean sheets on your bed the day you start using CHG soap. Use a clean washcloth (not used since being washed) for each shower. DO NOT sleep with pets once you start using the CHG.   CHG Shower Instructions:  Wash your face and private area with normal soap. If you choose to wash your hair, wash first with your normal shampoo.  After you use shampoo/soap, rinse your hair and body thoroughly to remove shampoo/soap residue.  Turn the water OFF and apply about 3 tablespoons (45 ml) of CHG soap to a CLEAN washcloth.  Apply CHG soap ONLY FROM YOUR NECK DOWN TO YOUR TOES (washing for 3-5 minutes)  DO NOT use CHG soap on face, private areas, open wounds, or sores.  Pay special attention to the area where your surgery is being performed.  If you are having back surgery, having someone wash your back for you may be helpful. Wait 2 minutes after CHG soap is applied, then you may rinse off the CHG soap.  Pat dry with a clean towel  Put on clean clothes/pajamas   If you choose to wear lotion, please use ONLY the CHG-compatible lotions that are listed below.  Additional instructions for the day of surgery: DO NOT APPLY any lotions, deodorants, cologne, or perfumes.   Do not bring valuables to the hospital. [facility] is not responsible for any belongings/valuables. Do not wear nail polish, gel polish, artificial nails, or any other type of covering on natural nails (fingers and toes) Do not wear jewelry or makeup Put on clean/comfortable clothes.  Please brush your teeth.  Ask your nurse before applying any prescription  medications to the skin.     CHG Compatible Lotions   Aveeno Moisturizing lotion  Cetaphil Moisturizing Cream  Cetaphil Moisturizing Lotion  Clairol Herbal Essence Moisturizing Lotion, Dry Skin  Clairol Herbal Essence Moisturizing Lotion, Extra Dry Skin  Clairol Herbal Essence Moisturizing Lotion, Normal Skin  Curel Age Defying Therapeutic Moisturizing Lotion with Alpha Hydroxy  Curel Extreme Care Body Lotion  Curel Soothing Hands Moisturizing Hand Lotion  Curel Therapeutic Moisturizing Cream, Fragrance-Free  Curel Therapeutic Moisturizing Lotion, Fragrance-Free  Curel Therapeutic Moisturizing Lotion, Original Formula  Eucerin Daily Replenishing Lotion  Eucerin Dry Skin Therapy Plus Alpha Hydroxy Crme  Eucerin Dry Skin Therapy Plus Alpha Hydroxy Lotion  Eucerin Original Crme  Eucerin Original Lotion  Eucerin Plus Crme Eucerin Plus Lotion  Eucerin TriLipid Replenishing Lotion  Keri Anti-Bacterial Hand Lotion  Keri Deep Conditioning Original Lotion Dry Skin Formula Softly Scented  Keri Deep  Conditioning Original Lotion, Fragrance Free Sensitive Skin Formula  Keri Lotion Fast Absorbing Fragrance Free Sensitive Skin Formula  Keri Lotion Fast Absorbing Softly Scented Dry Skin Formula  Keri Original Lotion  Keri Skin Renewal Lotion Keri Silky Smooth Lotion  Keri Silky Smooth Sensitive Skin Lotion  Nivea Body Creamy Conditioning Oil  Nivea Body Extra Enriched Lotion  Nivea Body Original Lotion  Nivea Body Sheer Moisturizing Lotion Nivea Crme  Nivea Skin Firming Lotion  NutraDerm 30 Skin Lotion  NutraDerm Skin Lotion  NutraDerm Therapeutic Skin Cream  NutraDerm Therapeutic Skin Lotion  ProShield Protective Hand Cream  Provon moisturizing lotion  Please read over the following fact sheets that you were given.

## 2024-03-05 ENCOUNTER — Encounter (HOSPITAL_COMMUNITY)
Admission: RE | Admit: 2024-03-05 | Discharge: 2024-03-05 | Disposition: A | Discharge status: Home / Self Care | Source: Ambulatory Visit | Attending: Orthopedic Surgery | Admitting: Orthopedic Surgery

## 2024-03-05 ENCOUNTER — Other Ambulatory Visit: Payer: Self-pay

## 2024-03-05 ENCOUNTER — Encounter (HOSPITAL_COMMUNITY): Payer: Self-pay

## 2024-03-05 VITALS — BP 124/75 | HR 75 | Temp 98.3°F | Resp 16 | Ht 63.0 in | Wt 205.0 lb

## 2024-03-05 DIAGNOSIS — Z8673 Personal history of transient ischemic attack (TIA), and cerebral infarction without residual deficits: Secondary | ICD-10-CM | POA: Insufficient documentation

## 2024-03-05 DIAGNOSIS — I10 Essential (primary) hypertension: Secondary | ICD-10-CM | POA: Diagnosis not present

## 2024-03-05 DIAGNOSIS — M4726 Other spondylosis with radiculopathy, lumbar region: Secondary | ICD-10-CM | POA: Diagnosis not present

## 2024-03-05 DIAGNOSIS — E7800 Pure hypercholesterolemia, unspecified: Secondary | ICD-10-CM | POA: Diagnosis not present

## 2024-03-05 DIAGNOSIS — K219 Gastro-esophageal reflux disease without esophagitis: Secondary | ICD-10-CM | POA: Insufficient documentation

## 2024-03-05 DIAGNOSIS — Z86718 Personal history of other venous thrombosis and embolism: Secondary | ICD-10-CM | POA: Diagnosis not present

## 2024-03-05 DIAGNOSIS — G8929 Other chronic pain: Secondary | ICD-10-CM | POA: Insufficient documentation

## 2024-03-05 DIAGNOSIS — Z01818 Encounter for other preprocedural examination: Secondary | ICD-10-CM

## 2024-03-05 DIAGNOSIS — F410 Panic disorder [episodic paroxysmal anxiety] without agoraphobia: Secondary | ICD-10-CM | POA: Insufficient documentation

## 2024-03-05 DIAGNOSIS — Z01812 Encounter for preprocedural laboratory examination: Secondary | ICD-10-CM | POA: Diagnosis not present

## 2024-03-05 DIAGNOSIS — M549 Dorsalgia, unspecified: Secondary | ICD-10-CM | POA: Diagnosis not present

## 2024-03-05 HISTORY — DX: Transient cerebral ischemic attack, unspecified: G45.9

## 2024-03-05 HISTORY — DX: Personal history of urinary calculi: Z87.442

## 2024-03-05 HISTORY — DX: Personal history of other venous thrombosis and embolism: Z86.718

## 2024-03-05 LAB — SURGICAL PCR SCREEN
MRSA, PCR: NEGATIVE
Staphylococcus aureus: NEGATIVE

## 2024-03-05 NOTE — Progress Notes (Addendum)
 PCP - Jacqulyn Ahle Cardiologist - Dr. Court - clearance is 02/17/24  PPM/ICD - denies  Chest x-ray -  EKG - 01/14/24 Stress Test - 02/2022 ECHO - 02/14/22 Cardiac Cath - 12/2017  Sleep Study - denies  No DM  Last dose of GLP1 agonist-  n/a GLP1 instructions: n/a  Blood Thinner Instructions: n/a Aspirin  Instructions: patient reaching out to surgeon for instructions when to stop Aspirin   ERAS Protcol - clears until 0930 PRE-SURGERY Ensure or G2- ensure as ordered  COVID TEST- n/a   Anesthesia review: yes -   Patient denies shortness of breath, fever, cough and chest pain at PAT appointment   All instructions explained to the patient, with a verbal understanding of the material. Patient agrees to go over the instructions while at home for a better understanding. Patient also instructed to self quarantine after being tested for COVID-19. The opportunity to ask questions was provided.  Unable to collect labs at PAT appointment.  Multiple attempts made by multiple nurses.  Notified Dr. Georgina.  Labs to be collected DOS.

## 2024-03-05 NOTE — Progress Notes (Signed)
 Surgical Instructions     Your procedure is scheduled on August 19, Tuesday, 2025. Report to [facility] Main Entrance A at 10:30 A.M., then check in with the Admitting office. Any questions or running late day of surgery: call [phone]   Questions prior to your surgery date: call [phone], Monday-Friday, 8am-4pm. If you experience any cold or flu symptoms such as cough, fever, chills, shortness of breath, etc. between now and your scheduled surgery, please notify us  at the above number.            Remember:       Do not eat after midnight the night before your surgery   You may drink clear liquids until 9:30 the morning of your surgery.   Clear liquids allowed are: Water, Non-Citrus Juices (without pulp), Carbonated Beverages, Clear Tea (no milk, honey, etc.), Black Coffee Only (NO MILK, CREAM OR POWDERED CREAMER of any kind), and Gatorade.         Patient Instructions   The night before surgery:  No food after midnight. ONLY clear liquids after midnight   The day of surgery (if you do NOT have diabetes):  Drink ONE (1) Pre-Surgery Clear Ensure by 9:30 the morning of surgery. Drink in one sitting. Do not sip.  This drink was given to you during your hospital  pre-op appointment visit.   Nothing else to drink after completing the  Pre-Surgery Clear Ensure.             If you have questions, please contact your surgeon's office.  Take these medicines the morning of surgery with A SIP OF WATER    AmLODipine  (NORVASC )  pantoprazole  (PROTONIX )  rosuvastatin  (CRESTOR )  Solifenacin  (Vesicare ) Topiramate  (Topamax )   May take these medicines IF NEEDED: Acetaminophen  (Tylenol ) nitroGLYCERIN  (NITROSTAT ) PLEASE CALL [phone] IF TAKEN BEFORE SURGERY     One week prior to surgery, STOP taking any Aspirin  (unless otherwise instructed by your surgeon) Aleve , Naproxen , Ibuprofen, Motrin, Advil, Goody's, BC's, all herbal medications, fish oil, and non-prescription  vitamins.     PLEASE FOLLOW UP WITH SURGEON REGARDING INSTRUCTIONS ON Aspirin                     If you use a CPAP at night, you may bring your mask/headgear for your overnight stay.   You will be asked to remove any contacts, glasses, piercing's, hearing aid's, dentures/partials prior to surgery. Please bring cases for these items if needed.    Patients discharged the day of surgery will not be allowed to drive home, and someone needs to stay with them for 24 hours.   SURGICAL WAITING ROOM VISITATION Patients may have no more than 2 support people in the waiting area - these visitors may rotate.   Pre-op nurse will coordinate an appropriate time for 1 ADULT support person, who may not rotate, to accompany patient in pre-op.  Children under the age of 85 must have an adult with them who is not the patient and must remain in the main waiting area with an adult.   If the patient needs to stay at the hospital during part of their recovery, the visitor guidelines for inpatient rooms apply.   Please refer to the [facility] website for the visitor guidelines for any additional information.     If you received a COVID test during your pre-op visit  it is requested that you wear a mask when out in public, stay away from anyone that may not be feeling  well and notify your surgeon if you develop symptoms. If you have been in contact with anyone that has tested positive in the last 10 days please notify you surgeon.         Pre-operative 5 CHG Bathing Instructions    You can play a key role in reducing the risk of infection after surgery. Your skin needs to be as free of germs as possible. You can reduce the number of germs on your skin by washing with CHG (chlorhexidine  gluconate) soap before surgery. CHG is an antiseptic soap that kills germs and continues to kill germs even after washing.    DO NOT use if you have an allergy to chlorhexidine /CHG or antibacterial soaps. If your skin becomes  reddened or irritated, stop using the CHG and notify one of our RNs at [phone].    Please shower with the CHG soap starting 4 days before surgery using the following schedule:       Please keep in mind the following:  DO NOT shave, including legs and underarms, starting the day of your first shower.   You may shave your face at any point before/day of surgery.  Place clean sheets on your bed the day you start using CHG soap. Use a clean washcloth (not used since being washed) for each shower. DO NOT sleep with pets once you start using the CHG.    CHG Shower Instructions:  Wash your face and private area with normal soap. If you choose to wash your hair, wash first with your normal shampoo.  After you use shampoo/soap, rinse your hair and body thoroughly to remove shampoo/soap residue.  Turn the water OFF and apply about 3 tablespoons (45 ml) of CHG soap to a CLEAN washcloth.  Apply CHG soap ONLY FROM YOUR NECK DOWN TO YOUR TOES (washing for 3-5 minutes)  DO NOT use CHG soap on face, private areas, open wounds, or sores.  Pay special attention to the area where your surgery is being performed.  If you are having back surgery, having someone wash your back for you may be helpful. Wait 2 minutes after CHG soap is applied, then you may rinse off the CHG soap.  Pat dry with a clean towel  Put on clean clothes/pajamas   If you choose to wear lotion, please use ONLY the CHG-compatible lotions that are listed below.   Additional instructions for the day of surgery: DO NOT APPLY any lotions, deodorants, cologne, or perfumes.   Do not bring valuables to the hospital. [facility] is not responsible for any belongings/valuables. Do not wear nail polish, gel polish, artificial nails, or any other type of covering on natural nails (fingers and toes) Do not wear jewelry or makeup Put on clean/comfortable clothes.  Please brush your teeth.  Ask your nurse before applying any prescription  medications to the skin.        CHG Compatible Lotions    Aveeno Moisturizing lotion  Cetaphil Moisturizing Cream  Cetaphil Moisturizing Lotion  Clairol Herbal Essence Moisturizing Lotion, Dry Skin  Clairol Herbal Essence Moisturizing Lotion, Extra Dry Skin  Clairol Herbal Essence Moisturizing Lotion, Normal Skin  Curel Age Defying Therapeutic Moisturizing Lotion with Alpha Hydroxy  Curel Extreme Care Body Lotion  Curel Soothing Hands Moisturizing Hand Lotion  Curel Therapeutic Moisturizing Cream, Fragrance-Free  Curel Therapeutic Moisturizing Lotion, Fragrance-Free  Curel Therapeutic Moisturizing Lotion, Original Formula  Eucerin Daily Replenishing Lotion  Eucerin Dry Skin Therapy Plus Alpha Hydroxy Crme  Eucerin Dry Skin Therapy  Plus Alpha Hydroxy Lotion  Eucerin Original Crme  Eucerin Original Lotion  Eucerin Plus Crme Eucerin Plus Lotion  Eucerin TriLipid Replenishing Lotion  Keri Anti-Bacterial Hand Lotion  Keri Deep Conditioning Original Lotion Dry Skin Formula Softly Scented  Keri Deep Conditioning Original Lotion, Fragrance Free Sensitive Skin Formula  Keri Lotion Fast Absorbing Fragrance Free Sensitive Skin Formula  Keri Lotion Fast Absorbing Softly Scented Dry Skin Formula  Keri Original Lotion  Keri Skin Renewal Lotion Keri Silky Smooth Lotion  Keri Silky Smooth Sensitive Skin Lotion  Nivea Body Creamy Conditioning Oil  Nivea Body Extra Enriched Lotion  Nivea Body Original Lotion  Nivea Body Sheer Moisturizing Lotion Nivea Crme  Nivea Skin Firming Lotion  NutraDerm 30 Skin Lotion  NutraDerm Skin Lotion  NutraDerm Therapeutic Skin Cream  NutraDerm Therapeutic Skin Lotion  ProShield Protective Hand Cream  Provon moisturizing lotion   Please read over the following fact sheets that you were given.

## 2024-03-06 NOTE — Progress Notes (Addendum)
 Anesthesia Chart Review:  Case: [id number] Date/Time: 03/10/24 1215   Procedure: DECOMPRESSIVE LUMBAR LAMINECTOMY LEVEL 1 - L4-5 LAMINECTOMY   Anesthesia type: General   Diagnosis: Lumbar radiculopathy [M54.16]   Pre-op diagnosis: LUMBAR RADICULOPATHY   [facility]: [facility] ROOM 04 / [facility]   Surgeons: Georgina Ozell LABOR, MD       DISCUSSION: Patient is an 78 year old female scheduled for the above procedure.   History includes never smoker, HTN, hypercholesterolemia, conversion disorder (following daughter's death in 02/22/10), panic attacks (worse on the 15th of each month), GERD, DVT (RLE 12/25/17, s/p Xarelto  x3 months), TIA (05/2021), chronic back pain, nephrolithiasis (2013, 2014), migraines, partial thyroidectomy (reportedly benign pathology), cholecystectomy (04/20/10). She has had multiple evaluations for chest pain, most recently on 01/31/24 with CCTA  FFRct suggesting no obstructive CAD.   Patient has had numerous admissions and/or out-patient evaluations for chest pain dating back to at least 2008. Most recent testing includes a LHC in 2019 showing no significant CAD (30% proximal LAD), non-ischemic stress tests on 10/28/20 and 03/19/22 (LVEF 50-55%, abnormal septal motion inferior basal hypokinesis, grade 1 DD, normal RV systolic function, normal PASP, mild AV thickening/sclerosis with no AS by 02/14/22 TTE), and a CCTA on 01/31/24 (done as part of preoperative evaluation after reporting nitroglycerin  use for chest pain) that showed 1-24% LM, distal LAD, CX, OM2; 25-49% proximal LAD, mid LAD, ostial/proximal D1, mid RCA, distal RCA; 25% distal PDA; normal OM1, normal PLA. Because of her elevated CAC (1819, 98th percentile), the study was sent for FFR ct which was normal, suggesting no obstructive CAD.   As above recent CCTA was ordered following evaluation by cardiologist Dr. Dorn Ross for recent chest pain in which she had taken 2 nitroglycerin . She reported pain can occur with rest or activity,  can occur every 2-3 days and last about 10 minutes. Activity limited by her back and knee pains. She reported chest pains similar to what she had in the past. Reported episodes of orthostatic dizziness at home. Although with long history of chest pain with prior reassuring evaluation and with atypical symptoms, she did think Nitroglycerin  may help, so a CCTA was recommended prior to back surgery. This was done on 01/31/24 with FFR ct results as previously outlined. Dr. Ross reviewed and wrote, Coronary CT overall looks good. There is some calcium  in the arteries of the heart but not enough where it is affecting blood flow. Overall findings are reassuring. Of note, at PAT she reported still with occasional nitroglycerin  use (last ~ 1 week ago), but no change/no increased frequency from when she was evaluated by Dr. Ross and had CCTA within the past 2 months.   Preoperative cardiology input outlined on Feb 23, 2024 by West, Katlyn, NP, Patient was evaluated in office on 01/14/2024 by Dr. Dorn Ross.  Patient underwent coronary CTA 01/31/2024, per Dr. Ross, Coronary CTA was benign, ok to proceed with surgery from cardiac standpoint.    Given past medical history and time since last visit, based on ACC/AHA guidelines, CORIANNA AVALLONE is at acceptable risk for the planned procedure without further cardiovascular testing.    Patient was is a difficult PIV/lab stick. RN staff were unable to obtain labs at PAT after multiple attempt. Dr. Georgina was notified. Updated labs on arrival the day of surgery. Of note, patient signed a refusal of blood and blood products form. Last H/H 15.7/47.5 on 08/15/23.   Anesthesia team to evaluate on the day of surgery.    VS:  BP 124/75   Pulse 75   Temp 36.8 C   Resp 16   Ht 5' 3 (1.6 m)   Wt 93 kg   SpO2 97%   BMI 36.31 kg/m    PROVIDERS: Cook, Jayce G, DO is PCP  Alvan Carrier, MD is cardiologist    LABS: Multiple RN attempted to collect labs at her  PAT visit (reportedly 5-6 attempts), but were unsuccessful. RN notified Dr. Georgina. Labs as indicated on arrival for surgery. Most recent labs in [facility] include: Lab Results  Component Value Date   WBC 7.7 08/15/2023   HGB 15.7 08/15/2023   HCT 47.5 (H) 08/15/2023   PLT 299 08/15/2023   GLUCOSE 117 (H) 01/22/2024   CHOL 167 08/15/2023   TRIG 122 08/15/2023   HDL 55 08/15/2023   LDLCALC 90 08/15/2023   ALT 37 (H) 09/30/2023   AST 31 09/30/2023   NA 143 01/22/2024   K 4.5 01/22/2024   CL 105 01/22/2024   CREATININE 0.71 01/22/2024   BUN 12 01/22/2024   CO2 24 01/22/2024   HGBA1C 6.2 (H) 08/15/2023    Labs Reviewed  SURGICAL PCR SCREEN     IMAGES: CT Chest (over read CCTA) 01/31/24: IMPRESSION: No acute extracardiac incidental findings.  MRI L-spine 10/21/23: IMPRESSION: 1. Lumbar spondylosis and degenerative disc disease causing moderate to prominent impingement at L4-5; moderate impingement at L3-4; and mild impingement at L2-3 and L5-S1.   CTA Head/Neck 06/02/21: IMPRESSION: CTA neck: The common carotid, internal carotid and vertebral arteries are patent within the neck without stenosis. Mild atherosclerotic plaque within the proximal major branch vessels of the neck, proximal internal carotid arteries and at the origin of the right vertebral artery.   CTA head: 1. No intracranial large vessel occlusion or proximal high-grade arterial stenosis. 2. 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 3. Mild non-stenotic calcified plaque within the intracranial internal carotid arteries.   EKG: EKG 01/14/24: Normal sinus rhythm Normal ECG When compared with ECG of 11-Jun-2023 13:30, No significant change was found Confirmed by Alvan Carrier [phone]) on 01/14/2024 2:26:32 PM   CV: CTA Coronary 01/31/24: FINDINGS:  Calcium  score: LM and 3 vessel calcium  noted LM 28 LAD 661 LCX 273 RCA 857 Total 1819  Coronary Arteries: Right dominant with  no anomalies - LM: 1-24% calcified plaque - LAD: 25-49% mixed plaque proximal and mid vessel, 1-24% calcified plaque distally - D1: 25-49% mixed plaque ostium/proximal vessel - Circumflex: 1-24% mixed plaque proximal and mid vessel - OM1: Normal - OM2: 1-24% mixed plaque - RCA: 25-49% mixed plaque proximally, mid and distally - PDA: 25% calcified plaque distally - PLA: Normal   IMPRESSION: 1. LM/3 vessel calcium  with score 1819 which is 98 th percentile for age/sex 2.  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 CAD RADS 2 non obstructive CAD Given extent of calcium  study sent for FFR  FFRct 01/31/24: FINDINGS: RCA normal proximal 0.99, mid 0.94, distal 0.94 LAD normal proximal 0.97, mid 0.90, distal 0.86 LCX normal throughout 0.97   IMPRESSION: Normal FFR CT suggesting no obstructive CAD    Echo 02/14/22: IMPRESSIONS   1. Abnormal septal motion inferior basal hypokinesis . Left ventricular  ejection fraction, by estimation, is 50 to 55%. The left ventricle has low  normal function. The left ventricle has no regional wall motion  abnormalities. The left ventricular  internal cavity size was mildly dilated. Left ventricular diastolic  parameters are consistent with Grade I diastolic dysfunction (impaired  relaxation).   2. Right ventricular systolic function is normal. The right ventricular  size is normal. There is normal pulmonary artery systolic pressure.   3. The mitral valve is normal in structure. No evidence of mitral valve  regurgitation. No evidence of mitral stenosis.   4. The aortic valve is tricuspid. There is mild calcification of the  aortic valve. There is mild thickening of the aortic valve. Aortic valve  regurgitation is not visualized. Aortic valve sclerosis is present, with  no evidence of aortic valve stenosis.   5. The inferior vena cava is normal in size with greater than 50%  respiratory variability, suggesting right atrial pressure of 3 mmHg.     Long term monitor 10/18/20 - 10/31/20:  Patient had a min HR of 56 bpm, max HR of 152 bpm, and avg HR of 74 bpm. Predominant underlying rhythm was Sinus Rhythm. 1 run of Supraventricular Tachycardia occurred lasting 4 beats with a max rate of 152 bpm (avg 132 bpm). Isolated SVEs were rare  (<1.0%), SVE Couplets were rare (<1.0%), and no SVE Triplets were present. Isolated VEs were rare (<1.0%), and no VE Couplets or VE Triplets were present.    Cardiac cath 12/26/17: IMPRESSION:  Ms. Ytuarte has essentially normal coronary arteries and normal filling pressures.  I believe her chest pain is noncardiac.  The sheath was removed and a TR band was placed on the right wrist to achieve patent hemostasis.  She stable to undergo systemic any coagulation with a novel oral anticoagulant given her DVT and can be discharged home either today or tomorrow by the tried hospitalist service.    Past Medical History:  Diagnosis Date   Arthritis    Chronic back pain    Conversion disorder    caused by stress of daughter's death in 2010/03/28   Depression    GERD (gastroesophageal reflux disease)    History of DVT (deep vein thrombosis)    History of kidney stones    Hypercholesterolemia    Hypertension    Migraine    Panic attacks    daily, worse the 15th of each month   Thyroid  disease    TIA (transient ischemic attack)     Past Surgical History:  Procedure Laterality Date   ABDOMINAL HYSTERECTOMY     r ovary removal   APPENDECTOMY  1970   CHOLECYSTECTOMY  28-Mar-2010   CYSTOSCOPY  1990   KIDNEY STONE SURGERY  2009/03/28   LAPAROSCOPY  1984   LEFT HEART CATH AND CORONARY ANGIOGRAPHY N/A 12/26/2017   Procedure: LEFT HEART CATH AND CORONARY ANGIOGRAPHY;  Surgeon: Court Dorn PARAS, MD;  Location: [facility];  Service: Cardiovascular;  Laterality: N/A;   OVARY SURGERY Left 1980   STERILIZATION  1979   THYROID  SURGERY  Mar 28, 2009    MEDICATIONS:  acetaminophen  (TYLENOL ) 650 MG CR tablet   amitriptyline   (ELAVIL ) 50 MG tablet   amLODipine  (NORVASC ) 2.5 MG tablet   aspirin  EC 81 MG tablet   Cholecalciferol (VITAMIN D3) 50 MCG (2000 UT) capsule   nitroGLYCERIN  (NITROSTAT ) 0.4 MG SL tablet   pantoprazole  (PROTONIX ) 40 MG tablet   propranolol  (INDERAL ) 20 MG tablet   risperiDONE  (RISPERDAL ) 0.5 MG tablet   rosuvastatin  (CRESTOR ) 40 MG tablet   sertraline  (ZOLOFT ) 50 MG tablet   solifenacin  (VESICARE ) 10 MG tablet   topiramate  (TOPAMAX ) 50 MG tablet   No current facility-administered medications for this encounter.  She was advised to follow surgeon instructions regarding perioperative ASA. RN  staff also reaching out to surgeon.    Isaiah Ruder, PA-C Surgical Short Stay/Anesthesiology [facility] Phone [phone] [facility] Phone [phone] 03/06/2024 11:23 AM

## 2024-03-06 NOTE — Anesthesia Preprocedure Evaluation (Addendum)
 Anesthesia Evaluation  Patient identified by MRN, date of birth, ID band Patient awake    Reviewed: Allergy & Precautions, H&P , NPO status , Patient's Chart, lab work & pertinent test results  Airway Mallampati: II  TM Distance: >3 FB Neck ROM: Full    Dental no notable dental hx.    Pulmonary sleep apnea    Pulmonary exam normal breath sounds clear to auscultation       Cardiovascular hypertension, Pt. on medications negative cardio ROS Normal cardiovascular exam Rhythm:Regular Rate:Normal  CTA Coronary 01/31/24 (for preoperative evaluation for CP with Nitroglycerin  use): IMPRESSION: 1. LM/3 vessel calcium  with score 1819 which is 98 th percentile for age/sex 2.  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 CAD RADS 2 non obstructive CAD Given extent of calcium  study sent for FFR  FFRct 01/31/24: FINDINGS: RCA normal proximal 0.99, mid 0.94, distal 0.94 LAD normal proximal 0.97, mid 0.90, distal 0.86 LCX normal throughout 0.97  IMPRESSION: Normal FFR CT suggesting no obstructive CAD   Echo 02/14/22: IMPRESSIONS  1. Abnormal septal motion inferior basal hypokinesis . Left ventricular  ejection fraction, by estimation, is 50 to 55%. The left ventricle has low  normal function. The left ventricle has no regional wall motion  abnormalities. The left ventricular  internal cavity size was mildly dilated. Left ventricular diastolic  parameters are consistent with Grade I diastolic dysfunction (impaired  relaxation).  2. Right ventricular systolic function is normal. The right ventricular  size is normal. There is normal pulmonary artery systolic pressure.  3. The mitral valve is normal in structure. No evidence of mitral valve  regurgitation. No evidence of mitral stenosis.  4. The aortic valve is tricuspid. There is mild calcification of the  aortic valve. There is mild thickening of the aortic valve. Aortic  valve  regurgitation is not visualized. Aortic valve sclerosis is present, with  no evidence of aortic valve stenosis.  5. The inferior vena cava is normal in size with greater than 50%  respiratory variability, suggesting right atrial pressure of 3 mmHg.       Neuro/Psych  Headaches  Anxiety Depression    TIA negative psych ROS   GI/Hepatic Neg liver ROS,GERD  ,,  Endo/Other  negative endocrine ROS    Renal/GU negative Renal ROS  negative genitourinary   Musculoskeletal  (+) Arthritis , Osteoarthritis,    Abdominal  (+) + obese  Peds negative pediatric ROS (+)  Hematology negative hematology ROS (+)   Anesthesia Other Findings   Reproductive/Obstetrics negative OB ROS                              Anesthesia Physical Anesthesia Plan  ASA: 3  Anesthesia Plan: General   Post-op Pain Management:    Induction: Intravenous  PONV Risk Score and Plan: 3 and Ondansetron , Dexamethasone , Midazolam and Treatment may vary due to age or medical condition  Airway Management Planned: Oral ETT  Additional Equipment:   Intra-op Plan:   Post-operative Plan: Extubation in OR  Informed Consent: I have reviewed the patients History and Physical, chart, labs and discussed the procedure including the risks, benefits and alternatives for the proposed anesthesia with the patient or authorized representative who has indicated his/her understanding and acceptance.     Dental advisory given  Plan Discussed with: CRNA  Anesthesia Plan Comments: (PAT note written 03/06/2024 by Nick Stults, PA-C. Chronic, intermittent chest pain with reassuring CCTA in July  2025. Has cardiology clearance from Dr. Dorn Ross. She signed a refusal of blood/blood products form. Difficult needle stick--needs labs on the day of surgery.   )         Anesthesia Quick Evaluation

## 2024-03-09 NOTE — H&P (Signed)
 Orthopedic Spine Surgery H&P Note  Assessment: Patient is a 78 y.o. female with lumbar radiculopathy   Plan: -Out of bed as tolerated, activity as tolerated, no brace -Covered the risks of surgery one more time with the patient and patient elected to proceed with planned surgery -Written consent verified -Hold anticoagulation in anticipation of surgery -Ancef  and TXA on all to OR -NPO for procedure -Site marked -To OR when ready  The risks covered today included but were not limited to: dvt/pe, iatrogenic instability, dural tear, nerve root injury, paralysis, persistent pain, infection, bleeding, heart attack, death, and need for additional procedures.  ___________________________________________________________________________  Chief Complaint: low back pain that radiates into bilateral lower extremities  History: Patient is 78 y.o. female who has been previously seen in the office for low back pain that radiates into bilateral lower extremities. Her symptoms were consistent with lumbar radiculopathy. Her symptoms failed to improve with conservative treatment so operative management was discussed at the last office visit. The patient presents today with no changes in their symptoms since the last office visit. See previous office note for further details.    Review of systems: General: denies fevers and chills, myalgias Neurologic: denies recent changes in vision, slurred speech Abdomen: denies nausea, vomiting, hematemesis Respiratory: denies cough, shortness of breath  Past medical history: HTN CAD Depression Migraines OSA Chronic pain GERD   Allergies: morphine   Past surgical history:  Thyroid  surgery Over surgery Hysterectomy Appendectomy Cholecystectomy   Social history: Denies use of nicotine product (smoking, vaping, patches, smokeless) Alcohol  use: Denies Denies recreational drug use  Family history: -reviewed and not pertinent to lumbar  radiculopathy   Physical Exam:  General: no acute distress, appears stated age Neurologic: alert, answering questions appropriately, following commands Cardiovascular: regular rate, no cyanosis Respiratory: unlabored breathing on room air, symmetric chest rise Psychiatric: appropriate affect, normal cadence to speech   MSK (spine):  -Strength exam      Left  Right EHL    5/5  5/5 TA    5/5  5/5 GSC    5/5  5/5 Knee extension  5/5  5/5 Knee flexion   5/5  5/5 Hip flexion   5/5  5/5  -Sensory exam   Sensation intact to light touch in C5-T1 nerve distributions of bilateral upper extremities   Patient name: Kendra [name] Patient MRN: [id number] Date: 03/10/2024

## 2024-03-10 ENCOUNTER — Ambulatory Visit (HOSPITAL_COMMUNITY)

## 2024-03-10 ENCOUNTER — Ambulatory Visit (HOSPITAL_COMMUNITY): Admitting: Certified Registered Nurse Anesthetist

## 2024-03-10 ENCOUNTER — Observation Stay (HOSPITAL_COMMUNITY)
Admission: RE | Admit: 2024-03-10 | Discharge: 2024-03-11 | Disposition: A | Discharge status: Home / Self Care | Attending: Orthopedic Surgery | Admitting: Orthopedic Surgery

## 2024-03-10 ENCOUNTER — Other Ambulatory Visit (HOSPITAL_COMMUNITY): Payer: Self-pay

## 2024-03-10 ENCOUNTER — Other Ambulatory Visit: Payer: Self-pay

## 2024-03-10 ENCOUNTER — Ambulatory Visit (HOSPITAL_COMMUNITY): Payer: Self-pay | Admitting: Vascular Surgery

## 2024-03-10 ENCOUNTER — Encounter (HOSPITAL_COMMUNITY)
Admission: RE | Disposition: A | Discharge status: Home / Self Care | Payer: Self-pay | Source: Home / Self Care | Attending: Orthopedic Surgery

## 2024-03-10 DIAGNOSIS — I2510 Atherosclerotic heart disease of native coronary artery without angina pectoris: Secondary | ICD-10-CM | POA: Diagnosis not present

## 2024-03-10 DIAGNOSIS — F418 Other specified anxiety disorders: Secondary | ICD-10-CM | POA: Diagnosis not present

## 2024-03-10 DIAGNOSIS — Z0189 Encounter for other specified special examinations: Secondary | ICD-10-CM | POA: Diagnosis not present

## 2024-03-10 DIAGNOSIS — I10 Essential (primary) hypertension: Secondary | ICD-10-CM | POA: Insufficient documentation

## 2024-03-10 DIAGNOSIS — M5416 Radiculopathy, lumbar region: Secondary | ICD-10-CM

## 2024-03-10 DIAGNOSIS — M4726 Other spondylosis with radiculopathy, lumbar region: Secondary | ICD-10-CM | POA: Diagnosis not present

## 2024-03-10 DIAGNOSIS — M48061 Spinal stenosis, lumbar region without neurogenic claudication: Secondary | ICD-10-CM | POA: Diagnosis not present

## 2024-03-10 DIAGNOSIS — Z01818 Encounter for other preprocedural examination: Principal | ICD-10-CM

## 2024-03-10 DIAGNOSIS — Z9889 Other specified postprocedural states: Secondary | ICD-10-CM

## 2024-03-10 HISTORY — DX: Radiculopathy, lumbar region: M54.16

## 2024-03-10 HISTORY — PX: DECOMPRESSIVE LUMBAR LAMINECTOMY LEVEL 1: SHX5791

## 2024-03-10 LAB — POCT I-STAT, CHEM 8
BUN: 14 mg/dL (ref 8–23)
Calcium, Ion: 1.08 mmol/L — ABNORMAL LOW (ref 1.15–1.40)
Chloride: 106 mmol/L (ref 98–111)
Creatinine, Ser: 0.7 mg/dL (ref 0.44–1.00)
Glucose, Bld: 104 mg/dL — ABNORMAL HIGH (ref 70–99)
HCT: 42 % (ref 36.0–46.0)
Hemoglobin: 14.3 g/dL (ref 12.0–15.0)
Potassium: 4 mmol/L (ref 3.5–5.1)
Sodium: 139 mmol/L (ref 135–145)
TCO2: 25 mmol/L (ref 22–32)

## 2024-03-10 SURGERY — DECOMPRESSIVE LUMBAR LAMINECTOMY LEVEL 1
Anesthesia: General | Site: Spine Lumbar

## 2024-03-10 MED ORDER — RISPERIDONE 0.5 MG PO TABS
0.5000 mg | ORAL_TABLET | Freq: Every day | ORAL | Status: DC
  Filled 2024-03-10 (×2): qty 1

## 2024-03-10 MED ORDER — PROPOFOL 10 MG/ML IV BOLUS
INTRAVENOUS | Status: AC
  Filled 2024-03-10: qty 20

## 2024-03-10 MED ORDER — VANCOMYCIN HCL 1000 MG IV SOLR
INTRAVENOUS | Status: AC
  Filled 2024-03-10: qty 20

## 2024-03-10 MED ORDER — LACTATED RINGERS IV SOLN: INTRAVENOUS | Status: DC

## 2024-03-10 MED ORDER — ORAL CARE MOUTH RINSE: 15.0000 mL | Freq: Once | OROMUCOSAL | Status: AC

## 2024-03-10 MED ORDER — AMLODIPINE BESYLATE 2.5 MG PO TABS: 2.5000 mg | ORAL_TABLET | Freq: Every day | ORAL | Status: DC

## 2024-03-10 MED ORDER — METHOCARBAMOL 500 MG PO TABS
500.0000 mg | ORAL_TABLET | Freq: Four times a day (QID) | ORAL | Status: DC
  Administered 2024-03-11: 500 mg via ORAL
  Filled 2024-03-10: qty 1

## 2024-03-10 MED ORDER — PANTOPRAZOLE SODIUM 40 MG PO TBEC: 40.0000 mg | DELAYED_RELEASE_TABLET | Freq: Every day | ORAL | Status: DC

## 2024-03-10 MED ORDER — FESOTERODINE FUMARATE ER 4 MG PO TB24
4.0000 mg | ORAL_TABLET | Freq: Every day | ORAL | Status: DC
  Administered 2024-03-11: 4 mg via ORAL
  Filled 2024-03-10 (×2): qty 1

## 2024-03-10 MED ORDER — PROPRANOLOL HCL 10 MG PO TABS
10.0000 mg | ORAL_TABLET | Freq: Once | ORAL | Status: AC
  Administered 2024-03-10: 10 mg via ORAL
  Filled 2024-03-10 (×2): qty 1

## 2024-03-10 MED ORDER — DEXAMETHASONE SODIUM PHOSPHATE 10 MG/ML IJ SOLN
INTRAMUSCULAR | Status: AC
  Filled 2024-03-10: qty 1

## 2024-03-10 MED ORDER — HYDROMORPHONE HCL 1 MG/ML IJ SOLN
INTRAMUSCULAR | Status: AC
Start: 2024-03-10 — End: 2024-03-10
  Filled 2024-03-10: qty 1

## 2024-03-10 MED ORDER — PHENYLEPHRINE 80 MCG/ML (10ML) SYRINGE FOR IV PUSH (FOR BLOOD PRESSURE SUPPORT)
PREFILLED_SYRINGE | INTRAVENOUS | Status: AC
Start: 2024-03-10 — End: 2024-03-10
  Filled 2024-03-10: qty 10

## 2024-03-10 MED ORDER — HYDRALAZINE HCL 20 MG/ML IJ SOLN
INTRAMUSCULAR | Status: DC | PRN
Start: 2024-03-10 — End: 2024-03-10
  Administered 2024-03-10 (×2): 10 mg via INTRAVENOUS

## 2024-03-10 MED ORDER — CHLORHEXIDINE GLUCONATE 0.12 % MT SOLN
OROMUCOSAL | Status: AC
  Administered 2024-03-10: 15 mL via OROMUCOSAL
  Filled 2024-03-10: qty 15

## 2024-03-10 MED ORDER — NITROGLYCERIN 0.4 MG SL SUBL: 0.4000 mg | SUBLINGUAL_TABLET | SUBLINGUAL | Status: DC | PRN

## 2024-03-10 MED ORDER — OXYCODONE HCL 5 MG/5ML PO SOLN: 5.0000 mg | Freq: Once | ORAL | Status: DC | PRN

## 2024-03-10 MED ORDER — ONDANSETRON HCL 4 MG/2ML IJ SOLN
4.0000 mg | Freq: Four times a day (QID) | INTRAMUSCULAR | Status: DC | PRN
  Administered 2024-03-11 (×2): 4 mg via INTRAVENOUS
  Filled 2024-03-10 (×2): qty 2

## 2024-03-10 MED ORDER — OXYCODONE HCL 5 MG PO TABS
5.0000 mg | ORAL_TABLET | ORAL | Status: DC | PRN
  Administered 2024-03-11: 5 mg via ORAL
  Filled 2024-03-10: qty 1

## 2024-03-10 MED ORDER — ONDANSETRON HCL 4 MG/2ML IJ SOLN
INTRAMUSCULAR | Status: AC
  Filled 2024-03-10: qty 2

## 2024-03-10 MED ORDER — LIDOCAINE 2% (20 MG/ML) 5 ML SYRINGE
INTRAMUSCULAR | Status: DC | PRN
  Administered 2024-03-10: 100 mg via INTRAVENOUS

## 2024-03-10 MED ORDER — SODIUM CHLORIDE 0.9 % IV SOLN: 12.5000 mg | INTRAVENOUS | Status: DC | PRN

## 2024-03-10 MED ORDER — BUPIVACAINE-EPINEPHRINE (PF) 0.25% -1:200000 IJ SOLN
INTRAMUSCULAR | Status: DC | PRN
  Administered 2024-03-10: 30 mL

## 2024-03-10 MED ORDER — POLYETHYLENE GLYCOL 3350 17 GM/SCOOP PO POWD
17.0000 g | Freq: Every day | ORAL | 0 refills | Status: AC
  Filled 2024-03-10: qty 238, 14d supply, fill #0

## 2024-03-10 MED ORDER — ACETAMINOPHEN 500 MG PO TABS
1000.0000 mg | ORAL_TABLET | Freq: Three times a day (TID) | ORAL | Status: DC
  Administered 2024-03-11 (×2): 1000 mg via ORAL
  Filled 2024-03-10 (×2): qty 2

## 2024-03-10 MED ORDER — ACETAMINOPHEN 500 MG PO TABS
1000.0000 mg | ORAL_TABLET | Freq: Three times a day (TID) | ORAL | 0 refills | Status: AC
  Filled 2024-03-10: qty 84, 14d supply, fill #0

## 2024-03-10 MED ORDER — HYDROMORPHONE HCL 1 MG/ML IJ SOLN: 0.5000 mg | INTRAMUSCULAR | Status: AC | PRN

## 2024-03-10 MED ORDER — CEFAZOLIN SODIUM-DEXTROSE 2-4 GM/100ML-% IV SOLN
INTRAVENOUS | Status: AC
  Filled 2024-03-10: qty 100

## 2024-03-10 MED ORDER — AMLODIPINE BESYLATE 2.5 MG PO TABS
2.5000 mg | ORAL_TABLET | Freq: Every day | ORAL | Status: DC
Start: 2024-03-11 — End: 2024-03-11
  Administered 2024-03-11: 2.5 mg via ORAL
  Filled 2024-03-10 (×2): qty 1

## 2024-03-10 MED ORDER — PHENYLEPHRINE HCL-NACL 20-0.9 MG/250ML-% IV SOLN
INTRAVENOUS | Status: DC | PRN
  Administered 2024-03-10: 40 ug/min via INTRAVENOUS

## 2024-03-10 MED ORDER — 0.9 % SODIUM CHLORIDE (POUR BTL) OPTIME
TOPICAL | Status: DC | PRN
  Administered 2024-03-10: 1000 mL

## 2024-03-10 MED ORDER — FENTANYL CITRATE (PF) 250 MCG/5ML IJ SOLN
INTRAMUSCULAR | Status: AC
  Filled 2024-03-10: qty 5

## 2024-03-10 MED ORDER — POLYETHYLENE GLYCOL 3350 17 G PO PACK: 17.0000 g | PACK | Freq: Every day | ORAL | Status: DC

## 2024-03-10 MED ORDER — ROCURONIUM BROMIDE 10 MG/ML (PF) SYRINGE
PREFILLED_SYRINGE | INTRAVENOUS | Status: AC
  Filled 2024-03-10: qty 10

## 2024-03-10 MED ORDER — SUGAMMADEX SODIUM 200 MG/2ML IV SOLN
INTRAVENOUS | Status: DC | PRN
  Administered 2024-03-10: 200 mg via INTRAVENOUS

## 2024-03-10 MED ORDER — METHOCARBAMOL 500 MG PO TABS: 500.0000 mg | ORAL_TABLET | Freq: Four times a day (QID) | ORAL | Status: DC

## 2024-03-10 MED ORDER — OXYCODONE HCL 5 MG PO TABS: 5.0000 mg | ORAL_TABLET | Freq: Once | ORAL | Status: DC | PRN

## 2024-03-10 MED ORDER — METHOCARBAMOL 500 MG PO TABS
500.0000 mg | ORAL_TABLET | Freq: Four times a day (QID) | ORAL | 0 refills | Status: AC
  Filled 2024-03-10: qty 40, 10d supply, fill #0

## 2024-03-10 MED ORDER — HYDROMORPHONE HCL 1 MG/ML IJ SOLN
0.2500 mg | INTRAMUSCULAR | Status: DC | PRN
  Administered 2024-03-10 (×3): 0.5 mg via INTRAVENOUS

## 2024-03-10 MED ORDER — ONDANSETRON HCL 4 MG PO TABS: 4.0000 mg | ORAL_TABLET | Freq: Four times a day (QID) | ORAL | Status: DC | PRN

## 2024-03-10 MED ORDER — VANCOMYCIN HCL 1000 MG IV SOLR
INTRAVENOUS | Status: DC | PRN
  Administered 2024-03-10: 1000 mg via TOPICAL

## 2024-03-10 MED ORDER — TOPIRAMATE 25 MG PO TABS
50.0000 mg | ORAL_TABLET | Freq: Every day | ORAL | Status: DC
  Administered 2024-03-11: 50 mg via ORAL
  Filled 2024-03-10: qty 2

## 2024-03-10 MED ORDER — ROCURONIUM BROMIDE 10 MG/ML (PF) SYRINGE
PREFILLED_SYRINGE | INTRAVENOUS | Status: DC | PRN
  Administered 2024-03-10: 20 mg via INTRAVENOUS
  Administered 2024-03-10: 10 mg via INTRAVENOUS
  Administered 2024-03-10: 60 mg via INTRAVENOUS

## 2024-03-10 MED ORDER — SENNA 8.6 MG PO TABS
1.0000 | ORAL_TABLET | Freq: Two times a day (BID) | ORAL | Status: DC
  Administered 2024-03-11: 8.6 mg via ORAL
  Filled 2024-03-10: qty 1

## 2024-03-10 MED ORDER — DEXAMETHASONE SODIUM PHOSPHATE 10 MG/ML IJ SOLN
10.0000 mg | Freq: Once | INTRAMUSCULAR | Status: AC
  Administered 2024-03-10: 10 mg via INTRAVENOUS

## 2024-03-10 MED ORDER — TRANEXAMIC ACID-NACL 1000-0.7 MG/100ML-% IV SOLN
1000.0000 mg | INTRAVENOUS | Status: AC
  Administered 2024-03-10: 1000 mg via INTRAVENOUS

## 2024-03-10 MED ORDER — FENTANYL CITRATE (PF) 250 MCG/5ML IJ SOLN
INTRAMUSCULAR | Status: DC | PRN
  Administered 2024-03-10 (×2): 50 ug via INTRAVENOUS

## 2024-03-10 MED ORDER — AMITRIPTYLINE HCL 50 MG PO TABS: 50.0000 mg | ORAL_TABLET | Freq: Every day | ORAL | Status: DC

## 2024-03-10 MED ORDER — CEFAZOLIN SODIUM-DEXTROSE 2-4 GM/100ML-% IV SOLN
2.0000 g | Freq: Four times a day (QID) | INTRAVENOUS | Status: AC
  Administered 2024-03-10 – 2024-03-11 (×2): 2 g via INTRAVENOUS
  Filled 2024-03-10 (×2): qty 100

## 2024-03-10 MED ORDER — SENNA 8.6 MG PO TABS
1.0000 | ORAL_TABLET | Freq: Two times a day (BID) | ORAL | 0 refills | Status: AC
  Filled 2024-03-10: qty 28, 14d supply, fill #0

## 2024-03-10 MED ORDER — PHENYLEPHRINE 80 MCG/ML (10ML) SYRINGE FOR IV PUSH (FOR BLOOD PRESSURE SUPPORT)
PREFILLED_SYRINGE | INTRAVENOUS | Status: DC | PRN
Start: 2024-03-10 — End: 2024-03-10
  Administered 2024-03-10 (×2): 80 ug via INTRAVENOUS

## 2024-03-10 MED ORDER — AMISULPRIDE (ANTIEMETIC) 5 MG/2ML IV SOLN
10.0000 mg | Freq: Once | INTRAVENOUS | Status: AC | PRN
  Administered 2024-03-10: 10 mg via INTRAVENOUS

## 2024-03-10 MED ORDER — SERTRALINE HCL 50 MG PO TABS: 150.0000 mg | ORAL_TABLET | Freq: Every day | ORAL | Status: DC

## 2024-03-10 MED ORDER — LIDOCAINE 2% (20 MG/ML) 5 ML SYRINGE
INTRAMUSCULAR | Status: AC
  Filled 2024-03-10: qty 5

## 2024-03-10 MED ORDER — PROPOFOL 10 MG/ML IV BOLUS
INTRAVENOUS | Status: DC | PRN
  Administered 2024-03-10: 100 mg via INTRAVENOUS

## 2024-03-10 MED ORDER — PROPRANOLOL HCL 20 MG PO TABS
20.0000 mg | ORAL_TABLET | Freq: Every day | ORAL | Status: DC
  Filled 2024-03-10: qty 1

## 2024-03-10 MED ORDER — TRANEXAMIC ACID-NACL 1000-0.7 MG/100ML-% IV SOLN
1000.0000 mg | Freq: Once | INTRAVENOUS | Status: AC
  Administered 2024-03-10: 1000 mg via INTRAVENOUS
  Filled 2024-03-10: qty 100

## 2024-03-10 MED ORDER — CHLORHEXIDINE GLUCONATE 0.12 % MT SOLN: 15.0000 mL | Freq: Once | OROMUCOSAL | Status: AC

## 2024-03-10 MED ORDER — ROSUVASTATIN CALCIUM 20 MG PO TABS: 40.0000 mg | ORAL_TABLET | Freq: Every day | ORAL | Status: DC

## 2024-03-10 MED ORDER — POVIDONE-IODINE 10 % EX SWAB
2.0000 | Freq: Once | CUTANEOUS | Status: AC
  Administered 2024-03-10: 2 via TOPICAL

## 2024-03-10 MED ORDER — OXYCODONE HCL 5 MG PO TABS
5.0000 mg | ORAL_TABLET | ORAL | 0 refills | Status: AC | PRN
  Filled 2024-03-10: qty 30, 5d supply, fill #0

## 2024-03-10 MED ORDER — BUPIVACAINE-EPINEPHRINE (PF) 0.25% -1:200000 IJ SOLN
INTRAMUSCULAR | Status: AC
  Filled 2024-03-10: qty 30

## 2024-03-10 MED ORDER — CEFAZOLIN SODIUM-DEXTROSE 2-4 GM/100ML-% IV SOLN
2.0000 g | INTRAVENOUS | Status: AC
  Administered 2024-03-10: 2 g via INTRAVENOUS

## 2024-03-10 MED ORDER — AMISULPRIDE (ANTIEMETIC) 5 MG/2ML IV SOLN
INTRAVENOUS | Status: AC
  Filled 2024-03-10: qty 4

## 2024-03-10 MED ORDER — HYDROMORPHONE HCL 1 MG/ML IJ SOLN
INTRAMUSCULAR | Status: AC
  Filled 2024-03-10: qty 1

## 2024-03-10 MED ORDER — ONDANSETRON HCL 4 MG/2ML IJ SOLN
INTRAMUSCULAR | Status: DC | PRN
  Administered 2024-03-10: 4 mg via INTRAVENOUS

## 2024-03-10 MED ORDER — TRANEXAMIC ACID-NACL 1000-0.7 MG/100ML-% IV SOLN
INTRAVENOUS | Status: AC
  Filled 2024-03-10: qty 100

## 2024-03-10 SURGICAL SUPPLY — 34 items
BLADE CLIPPER SURG (BLADE) IMPLANT
BUR MATCHSTICK NEURO 3.0 LAGG (BURR) ×1 IMPLANT
CANISTER SUCTION 3000ML PPV (SUCTIONS) ×1 IMPLANT
COVER MAYO STAND STRL (DRAPES) ×3 IMPLANT
COVER SURGICAL LIGHT HANDLE (MISCELLANEOUS) ×1 IMPLANT
DERMABOND ADVANCED .7 DNX12 (GAUZE/BANDAGES/DRESSINGS) ×1 IMPLANT
DRAPE C-ARM 42X72 X-RAY (DRAPES) ×1 IMPLANT
DRAPE UTILITY XL STRL (DRAPES) ×2 IMPLANT
DRESSING MEPILEX FLEX 4X4 (GAUZE/BANDAGES/DRESSINGS) ×1 IMPLANT
DRSG MEPILEX POST OP 4X8 (GAUZE/BANDAGES/DRESSINGS) ×1 IMPLANT
DRSG TEGADERM 4X4.75 (GAUZE/BANDAGES/DRESSINGS) ×2 IMPLANT
DURAPREP 26ML APPLICATOR (WOUND CARE) ×1 IMPLANT
ELECT COATED BLADE 2.86 ST (ELECTRODE) ×1 IMPLANT
ELECT PENCIL ROCKER SW 15FT (MISCELLANEOUS) ×1 IMPLANT
ELECTRODE REM PT RTRN 9FT ADLT (ELECTROSURGICAL) ×1 IMPLANT
GAUZE SPONGE 4X4 12PLY STRL (GAUZE/BANDAGES/DRESSINGS) ×1 IMPLANT
GLOVE INDICATOR 7.5 STRL GRN (GLOVE) ×1 IMPLANT
GLOVE SS BIOGEL STRL SZ 7.5 (GLOVE) ×1 IMPLANT
GOWN STRL REUS W/ TWL LRG LVL3 (GOWN DISPOSABLE) ×1 IMPLANT
GOWN STRL SURGICAL XL XLNG (GOWN DISPOSABLE) ×1 IMPLANT
KIT BASIN OR (CUSTOM PROCEDURE TRAY) ×1 IMPLANT
KIT POSITIONER JACKSON TABLE (MISCELLANEOUS) ×1 IMPLANT
KIT TURNOVER KIT B (KITS) ×1 IMPLANT
NS IRRIG 1000ML POUR BTL (IV SOLUTION) ×1 IMPLANT
PACK LAMINECTOMY ORTHO (CUSTOM PROCEDURE TRAY) ×1 IMPLANT
SUCTION TUBE FRAZIER 10FR DISP (SUCTIONS) ×1 IMPLANT
SUT BONE WAX W31G (SUTURE) ×1 IMPLANT
SUT MNCRL AB 3-0 PS2 27 (SUTURE) ×1 IMPLANT
SUT VIC AB 0 CT1 18XCR BRD8 (SUTURE) ×1 IMPLANT
SUT VIC AB 2-0 CT1 18 (SUTURE) ×1 IMPLANT
TOWEL GREEN STERILE (TOWEL DISPOSABLE) ×1 IMPLANT
TOWEL GREEN STERILE FF (TOWEL DISPOSABLE) ×1 IMPLANT
TUBING FEATHERFLOW (TUBING) ×1 IMPLANT
WATER STERILE IRR 1000ML POUR (IV SOLUTION) ×1 IMPLANT

## 2024-03-10 NOTE — Plan of Care (Signed)

## 2024-03-10 NOTE — Op Note (Signed)
 Orthopedic Spine Surgery Operative Report  Procedure: L4, L5 segment lumbar laminectomy with partial medial facetectomies  Modifier: none  Date of procedure: 03/10/2024  Patient name: Kendra [name] MRN: [id number] DOB: January 01, 1946  Surgeon: Ozell Ada, MD Assistant: none Pre-operative diagnosis: lumbar stenosis, lumbar radiculopathy Post-operative diagnosis: same as above Findings: L4/5 hypertrophic facets and thickened ligamentum flavum  Specimens: none Anesthesia: general EBL: 100cc Complications: none Pre-incision antibiotic: ancef  TXA was given prior to incision as well  Implants: none   Indication for procedure: Patient is a 78 y.o. female who presented to the office with symptoms consistent with lumbar radiculopathy. The patient had tried conservative treatments that did not provide any lasting relief. As result, operative management was discussed. L4 and L5 segment laminectomies with partial medial facetectomies was presented as a treatment option. The risks including but not limited to iatrogenic instability, dural tear, nerve root injury, paralysis, persistent pain, infection, bleeding, heart attack, death, stroke, dvt/pe, and need for additional procedures were discussed with the patient. The benefit of the surgery would be improvement in the patient's radiating leg pain. The alternatives to surgical management were covered with the patient and included continued monitoring, physical therapy, over-the-counter pain medications, ambulatory aids, and activity modification. All the patient's questions were answered to her satisfaction. After this discussion, the patient expressed understanding and elected to proceed with surgical intervention.   Procedure Description: The patient was met in the pre-operative holding area. The patient's identity and consent were verified. The operative site was marked. The patient's remaining questions about the surgery were answered. The patient  was brought back to the operating room. General anesthesia was induced and an endotracheal tube was placed by the anesthesia staff. The patient was transferred to the prone [place] table in the prone position. All bony prominences were well padded. The head of the bed was slightly elevated and the eyes were free from compression by the face pillow. The surgical area was cleansed with alcohol . Fluoroscopy was then brought in to check rotation on the AP image and to mark the levels on the lateral image. The patient's skin was then prepped and draped in a standard, sterile fashion. A time out was performed that identified the patient, the procedure, and the operative levels. All team members agreed with what was stated in the time out.   A midline incision over the spinous processes of the previously marked levels was made and sharp dissection was continued down through the skin and dermis. Electrocautery was then used to continue the midline dissection down to the level of the spinous process. Subperiosteal dissection was performed using electrocautery to expose the lamina out lateral to the facet joint capsule. Care was taken to not violate the facet joint capsules. A lateral fluoroscopic image was taken to confirm the level. Subperiosteal dissection with electrocautery was then done to expose all the lamina and pars interarticularis at L4 and L5. Again, care was taken to avoid disruption of the facet capsules.    A rongeur was used to remove the spinous processes and interspinous ligaments between the L3/4 interspinous ligaments to the cranial portion of the L5 spinous process. Bone wax was used to obtain hemostasis at the bleeding bony surfaces. A high-speed burr was used to thin the lamina at L4 and the cranial aspect of L5. Above the level of the ligamentum, the lamina was thinned with the burr to the approximate level of the ligamentum. Care was taken to leave at least 8mm of pars interarticularis on each  side. A series of Kerrison rongeurs were used to remove the remaining lamina and ligamentum overlying the thecal sac. A woodsen was then used to protect the thecal sac as a kerrison was used to remove the medial portion of the right L4/5 facet joint. The same process was used to remove the medial aspect of the left L4/5 facet joint.   A woodsen was placed into the laminectomy site to palpate for any remaining areas of stenosis. The woodsen was able to be passed freely around the thecal sac from L4 pedicle to L5 pedicle. No stenosis was felt.   The wound was copiously irrigated with sterile saline. 30cc of marcaine  with epinephrine  was injected into the muscle, subcutaneous tissue, and dermis. 1g of vancomycin  powder was placed into the wound. The fascia was reapproximated with 0 vicryl suture. The subcutaneous fat was reapproximated with 0 vicryl suture. The deep dermal layer was reapproximated with 2-0 vicryl. The skin as closed with a 3-0 running monocryl. All counts were correct at the end of the case. Dermabond was applied over the skin. An island dressing was placed over the wound. The patient was transferred back to a bed and brought to the post-anesthesia care unit by anesthesia staff in stable condition.  Post-operative plan: The patient will recover in the post-anesthesia care unit and then go to the floor. The patient will receive two post-operative doses of ancef . She will get another dose of TXA. The patient will be out of bed as tolerated with no brace. The patient will work with physical therapy. The patient will likely discharge to home tomorrow.    Ozell Ada, MD Orthopedic Surgeon

## 2024-03-10 NOTE — Transfer of Care (Signed)
 Immediate Anesthesia Transfer of Care Note  Patient: Kendra [name]  Procedure(s) Performed: DECOMPRESSIVE LUMBAR LAMINECTOMY LEVEL 1 (Spine Lumbar)  Patient Location: PACU  Anesthesia Type:General  Level of Consciousness: drowsy  Airway & Oxygen  Therapy: Patient Spontanous Breathing and Patient connected to face mask oxygen   Post-op Assessment: Report given to RN and Post -op Vital signs reviewed and stable  Post vital signs: Reviewed and stable  Last Vitals:  Vitals Value Taken Time  BP 130/55 03/10/24 15:30  Temp    Pulse 76 03/10/24 15:31  Resp 13 03/10/24 15:31  SpO2 97 % 03/10/24 15:31  Vitals shown include unfiled device data.  Last Pain:  Vitals:   03/10/24 1119  TempSrc: Oral  PainSc:       Patients Stated Pain Goal: 3 (03/10/24 1110)  Complications: No notable events documented.

## 2024-03-10 NOTE — Discharge Summary (Signed)
 Orthopedic Surgery Discharge Summary  Patient name: Kendra [name] Patient MRN: [id number] Admit today: 03/10/2024 Discharge date: 03/11/2024  Attending physician: Ozell Ada, MD Final diagnosis: lumbar radiculopathy Findings: L4/5 hypertrophic facets and thickened ligamentum flavum   Hospital course: Patient is a 78 y.o. female who was admitted after undergoing L4/5 laminectomy. The patient had significant pain immediately after surgery, but pain eventually was controlled with a multimodal regimen including oxycodone . Labs during the hospitalization revealed no significant electrolyte abnormality. The patient worked with physical therapy who recommended discharge to home. The patient was tolerating an oral diet without issue and was voiding spontaneously after surgery. The patient's vitals were stable on the day of discharge. The patient was medically ready for discharge and was discharge to home on post-operative day one.  Instructions:   Orthopedic Surgery Discharge Instructions  Patient name: Kendra [name] Procedure Performed: L4/5 laminectomy Date of Surgery: 03/10/2024 Surgeon: Ozell Ada, MD  Pre-operative Diagnosis: lumbar radiculopathy Post-operative Diagnosis: same as above  Discharged to: home Discharge Condition: stable  Activity: You should refrain from bending, lifting, or twisting with objects greater than ten pounds until six weeks after surgery. You are encouraged to walk as much as desired. You can perform household activities such as cleaning dishes, doing laundry, vacuuming, etc. as long as the ten-pound restriction is followed. You do not need to wear a brace during the post-operative period.   Incision Care: Your incision site has a dressing over it. That dressing should remain in place and dry at all times for a total of one week after surgery. After one week, you can remove the dressing. Underneath the dressing, you will find skin glue. You should leave the  skin glue in place. It will fall off with time. Do not pick, rub, or scrub at it. Do not put cream or lotion over the surgical area. After one week and once the dressing is off, it is okay to let soap and water run over your incision. Again, do not pick, scrub, or rub at the skin glue when bathing. Do not submerge (e.g., take a bath, swim, go in a hot tub, etc.) until six weeks after surgery. There may be some bloody drainage from the incision into the dressing after surgery. This is normal. You do not need to replace the dressing. Continue to leave it in place for the one week as instructed above. Should the dressing become saturated with blood or drainage, please call the office for further instructions.   Medications: You have been prescribed oxycodone . This is a narcotic pain medication and should only be taken as prescribed. You should not drink alcohol  or operate heavy machinery (including driving) while taking this medication. The oxycodone  can cause constipation as a side effect. For that reason, you have been prescribed senna and miralax . These are both laxatives. You do not need to take this medication if you develop diarrhea. Should you remain constipated even while taking these medications, please increase the dose of miralax  to twice daily. Tylenol  has been prescribed to be taken every 8 hours, which will give you additional pain relief. Robaxin  is a muscle relaxer that has been prescribed to you for muscle spasm type pain. Take this medication as needed.   You can use over-the-counter NSAIDs (ibuprofen, Aleve , Celebrex , naproxen , meloxicam , etc.) for additional pain relief after this surgery starting 72 hours after surgery. These medications are safe to take with the Tylenol  you have been prescribed. You should not take these medications if you  have or have had kidney problems or gastrointestinal ulcers. Take these medications as instructed on the packaging.   In order to set expectations for  opioid prescriptions, you will only be prescribed opioids for a total of six weeks after surgery and, at two-weeks after surgery, your opioid prescription will start to tapered (decreased dosage and number of pills). If you have ongoing need for opioid medication six weeks after surgery, you will be referred to pain management. If you are already established with a provider that is giving you opioid medications, you should schedule an appointment with them for six weeks after surgery if you feel you are going to need another prescription. State law only allows for opioid prescriptions one week at a time. If you are running out of opioid medication near the end of the week, please call the office during business hours before running out so I can send you another prescription.   You may resume any home blood thinners (warfarin, lovenox , apixaban, plavix, xarelto , etc) 72 hours after your surgery. Take these medications as they were previously prescribed.  Driving: You should not drive while taking narcotic pain medications. You should start getting back to driving slowly and you may want to try driving in a parking lot before doing anything more.   Diet: You are safe to resume your regular diet after surgery.   Reasons to Call the Office After Surgery: You should feel free to call the office with any concerns or questions you have in the post-operative period, but you should definitely notify the office if you develop: -shortness of breath, chest pain, or trouble breathing -excessive bleeding, drainage, redness, or swelling around the surgical site -fevers, chills, or pain that is getting worse with each passing day -persistent nausea or vomiting -new weakness in either leg -new or worsening numbness or tingling in either leg -numbness in the groin, bowel or bladder incontinence -other concerns about your surgery  Follow Up Appointments: You should have an office appointment scheduled for  approximately two weeks after surgery. If you do not remember when this appointment is or do not already have it scheduled, please call the office to schedule.   Office Information:  -Ozell Ada, MD -Phone number: [phone] -Address: [address]       [place], [place] [address]

## 2024-03-10 NOTE — Discharge Instructions (Signed)
 Orthopedic Surgery Discharge Instructions  Patient name: Kendra [name] Procedure Performed: L4/5 laminectomy Date of Surgery: 03/10/2024 Surgeon: Ozell Ada, MD  Pre-operative Diagnosis: lumbar radiculopathy Post-operative Diagnosis: same as above  Discharged to: home Discharge Condition: stable  Activity: You should refrain from bending, lifting, or twisting with objects greater than ten pounds until six weeks after surgery. You are encouraged to walk as much as desired. You can perform household activities such as cleaning dishes, doing laundry, vacuuming, etc. as long as the ten-pound restriction is followed. You do not need to wear a brace during the post-operative period.   Incision Care: Your incision site has a dressing over it. That dressing should remain in place and dry at all times for a total of one week after surgery. After one week, you can remove the dressing. Underneath the dressing, you will find skin glue. You should leave the skin glue in place. It will fall off with time. Do not pick, rub, or scrub at it. Do not put cream or lotion over the surgical area. After one week and once the dressing is off, it is okay to let soap and water run over your incision. Again, do not pick, scrub, or rub at the skin glue when bathing. Do not submerge (e.g., take a bath, swim, go in a hot tub, etc.) until six weeks after surgery. There may be some bloody drainage from the incision into the dressing after surgery. This is normal. You do not need to replace the dressing. Continue to leave it in place for the one week as instructed above. Should the dressing become saturated with blood or drainage, please call the office for further instructions.   Medications: You have been prescribed oxycodone . This is a narcotic pain medication and should only be taken as prescribed. You should not drink alcohol  or operate heavy machinery (including driving) while taking this medication. The oxycodone  can  cause constipation as a side effect. For that reason, you have been prescribed senna and miralax . These are both laxatives. You do not need to take this medication if you develop diarrhea. Should you remain constipated even while taking these medications, please increase the dose of miralax  to twice daily. Tylenol  has been prescribed to be taken every 8 hours, which will give you additional pain relief. Robaxin  is a muscle relaxer that has been prescribed to you for muscle spasm type pain. Take this medication as needed.   You can use over-the-counter NSAIDs (ibuprofen, Aleve , Celebrex , naproxen , meloxicam , etc.) for additional pain relief after this surgery starting 72 hours after surgery. These medications are safe to take with the Tylenol  you have been prescribed. You should not take these medications if you have or have had kidney problems or gastrointestinal ulcers. Take these medications as instructed on the packaging.   In order to set expectations for opioid prescriptions, you will only be prescribed opioids for a total of six weeks after surgery and, at two-weeks after surgery, your opioid prescription will start to tapered (decreased dosage and number of pills). If you have ongoing need for opioid medication six weeks after surgery, you will be referred to pain management. If you are already established with a provider that is giving you opioid medications, you should schedule an appointment with them for six weeks after surgery if you feel you are going to need another prescription. State law only allows for opioid prescriptions one week at a time. If you are running out of opioid medication near the end of  the week, please call the office during business hours before running out so I can send you another prescription.   You may resume any home blood thinners (warfarin, lovenox , apixaban, plavix, xarelto , etc) 72 hours after your surgery. Take these medications as they were previously  prescribed.  Driving: You should not drive while taking narcotic pain medications. You should start getting back to driving slowly and you may want to try driving in a parking lot before doing anything more.   Diet: You are safe to resume your regular diet after surgery.   Reasons to Call the Office After Surgery: You should feel free to call the office with any concerns or questions you have in the post-operative period, but you should definitely notify the office if you develop: -shortness of breath, chest pain, or trouble breathing -excessive bleeding, drainage, redness, or swelling around the surgical site -fevers, chills, or pain that is getting worse with each passing day -persistent nausea or vomiting -new weakness in either leg -new or worsening numbness or tingling in either leg -numbness in the groin, bowel or bladder incontinence -other concerns about your surgery  Follow Up Appointments: You should have an office appointment scheduled for approximately two weeks after surgery. If you do not remember when this appointment is or do not already have it scheduled, please call the office to schedule.   Office Information:  -Ozell Ada, MD -Phone number: [phone] -Address: [address]       [place], [place] [address]

## 2024-03-10 NOTE — Progress Notes (Signed)
 Orthopedic Surgery Post-operative Progress Note  Assessment: Patient is a 78 y.o. female who is currently admitted after undergoing L4/5 laminectomy   Plan: -Operative plans complete -Drain: none -Out of bed as tolerated, no brace -No bending/lifting/twisting greater than 10 pounds -PT evaluate and treat -Pain control -Diabetic diet (patient is not diabetic but this will limit her simple carbohydrates post-operatively since [facility] diabetic diet still allows for french toast, juice, cake, etc.) -No chemoprophylaxis for dvt or antiplatelets for 72 hours after surgery -Ancef  x2 post-operative doses -Disposition: to floor from PACU  ___________________________________________________________________________   Subjective: No acute events since surgery. Recovering in PACU. Pain well controlled. Having some back pain. No radiating leg pain.   Objective:  General: no acute distress, appropriate affect Neurologic: alert, answering questions appropriately, following commands Respiratory: unlabored breathing on room air Skin: dressing clear/dry/intact  MSK (spine):  -Strength exam      Right  Left  EHL    5/5  5/5 TA    5/5  5/5 GSC    5/5  5/5 Knee extension  5/5  5/5 Hip flexion   5/5  5/5  -Sensory exam    Sensation intact to light touch in L3-S1 nerve distributions of bilateral lower extremities   Yesterday's total administered Morphine Milligram Equivalents: 0   Patient name: Kendra [name] Patient MRN: [id number] Date: 03/10/24

## 2024-03-10 NOTE — Anesthesia Postprocedure Evaluation (Signed)
 Anesthesia Post Note  Patient: Kendra [name]  Procedure(s) Performed: DECOMPRESSIVE LUMBAR LAMINECTOMY LEVEL 1 (Spine Lumbar)     Patient location during evaluation: PACU Anesthesia Type: General Level of consciousness: awake and alert Pain management: pain level controlled Vital Signs Assessment: post-procedure vital signs reviewed and stable Respiratory status: spontaneous breathing, nonlabored ventilation and respiratory function stable Cardiovascular status: blood pressure returned to baseline and stable Postop Assessment: no apparent nausea or vomiting Anesthetic complications: no   No notable events documented.  Last Vitals:  Vitals:   03/10/24 1645 03/10/24 1700  BP: 125/67 132/68  Pulse: 76 78  Resp: 17 (!) 9  Temp:  36.6 C  SpO2: 100% 100%    Last Pain:  Vitals:   03/10/24 1700  TempSrc:   PainSc: Asleep                 Butler Levander Pinal

## 2024-03-10 NOTE — Anesthesia Procedure Notes (Signed)
 Procedure Name: Intubation Date/Time: 03/10/2024 12:24 PM  Performed by: Viviana Almarie DASEN, CRNAPre-anesthesia Checklist: Patient identified, Emergency Drugs available, Suction available and Patient being monitored Patient Re-evaluated:Patient Re-evaluated prior to induction Oxygen  Delivery Method: Circle System Utilized Preoxygenation: Pre-oxygenation with 100% oxygen  Induction Type: IV induction Ventilation: Mask ventilation without difficulty Laryngoscope Size: Mac and 3 Grade View: Grade I Tube type: Oral Number of attempts: 1 Airway Equipment and Method: Stylet, Oral airway and Bite block Placement Confirmation: ETT inserted through vocal cords under direct vision, positive ETCO2 and breath sounds checked- equal and bilateral Secured at: 20 cm Tube secured with: Tape Dental Injury: Teeth and Oropharynx as per pre-operative assessment

## 2024-03-11 ENCOUNTER — Encounter (HOSPITAL_COMMUNITY): Payer: Self-pay | Admitting: Orthopedic Surgery

## 2024-03-11 ENCOUNTER — Other Ambulatory Visit (HOSPITAL_COMMUNITY): Payer: Self-pay

## 2024-03-11 DIAGNOSIS — M48061 Spinal stenosis, lumbar region without neurogenic claudication: Secondary | ICD-10-CM | POA: Diagnosis not present

## 2024-03-11 DIAGNOSIS — I2510 Atherosclerotic heart disease of native coronary artery without angina pectoris: Secondary | ICD-10-CM | POA: Diagnosis not present

## 2024-03-11 DIAGNOSIS — I10 Essential (primary) hypertension: Secondary | ICD-10-CM | POA: Diagnosis not present

## 2024-03-11 DIAGNOSIS — M5416 Radiculopathy, lumbar region: Secondary | ICD-10-CM | POA: Diagnosis not present

## 2024-03-11 LAB — BASIC METABOLIC PANEL WITH GFR
Anion gap: 13 (ref 5–15)
BUN: 8 mg/dL (ref 8–23)
CO2: 25 mmol/L (ref 22–32)
Calcium: 9.1 mg/dL (ref 8.9–10.3)
Chloride: 100 mmol/L (ref 98–111)
Creatinine, Ser: 0.76 mg/dL (ref 0.44–1.00)
GFR, Estimated: >60 mL/min (ref >60)
Glucose, Bld: 147 mg/dL — ABNORMAL HIGH (ref 70–99)
Potassium: 3.9 mmol/L (ref 3.5–5.1)
Sodium: 138 mmol/L (ref 135–145)

## 2024-03-11 MED ORDER — PROPRANOLOL HCL 10 MG PO TABS
10.0000 mg | ORAL_TABLET | Freq: Once | ORAL | Status: AC
  Administered 2024-03-11: 10 mg via ORAL

## 2024-03-11 NOTE — TOC Transition Note (Signed)
 Transition of Care [facility]) - Discharge Note   Patient Details  Name: Kendra [name] MRN: [id number] Date of Birth: September 29, 1945  [facility]) CM/SW Contact:  Andrez JULIANNA George, RN Phone Number: 03/11/2024, 9:46 AM   Clinical Narrative:     Pt is discharging home with home health through [place]. Information on the AVS. Hedda will contact her for the first home visit. Pt has supervision at home and transportation home.  Final next level of care: Home w Home Health Services Barriers to Discharge: No Barriers Identified   Patient Goals and CMS Choice   CMS Medicare.gov Compare Post Acute Care list provided to:: Patient Choice offered to / list presented to : Patient, Spouse      Discharge Placement                       Discharge Plan and Services Additional resources added to the After Visit Summary for                            [facility] Arranged: PT, OT [facility] Agency: [facility] Date [facility] Agency Contacted: 03/11/24   Representative spoke with at [facility] Agency: Darleene  Social Drivers of Health (SDOH) Interventions SDOH Screenings   Depression (PHQ2-9): High Risk (08/15/2023)  Tobacco Use: Low Risk  (03/05/2024)     Readmission Risk Interventions     No data to display

## 2024-03-11 NOTE — Plan of Care (Signed)

## 2024-03-11 NOTE — Progress Notes (Signed)
 Patient alert and oriented, mae's well, voiding adequate amount of urine, swallowing without difficulty, no c/o pain at time of discharge. Patient discharged home with husband. Medications and discharged instructions given to patient. Patient and husband stated understanding of instructions given. Room was checked and accounted for all patient's belongings; discharge instructions concerning her medications, incision care, follow up appointment and when to call the doctor as needed were all discussed with patient by RN and she expressed understanding on the instructions given.

## 2024-03-11 NOTE — Evaluation (Signed)
 Occupational Therapy Evaluation Patient Details Name: Kendra [name] MRN: [id number] DOB: 09-18-1945 Today's Date: 03/11/2024   History of Present Illness   78 y/o F s/p L4-5 laminectomy on 8/19. PMH includes HTN, hypercholesteremia, conversion disorder, panick attacks, GERD, DVT, TIA, chornic back pain, nephrolithaisis, migraines, partial thyroidectomy, cholecystectomy.     Clinical Impressions Pt has assist at baseline from spouse for ADLs, uses cane for mobility. Pt will have 24/7 assist from spouse at d/c. Pt lethargic upon arrival, needing mod A for bed mobility via log roll technique. Pt needing up to mod A for ADLs, mod A for bed mobility and min A for transfers with RW. Pt educated on back precautions and compensatory strategies for ADLs, but spouse reports he will be assisting. Provided with spanish back precaution handout and reviewed, but will need reinforcement. Pt presenting with impairments listed below, will follow acutely. Recommend HHOT at d/c.      If plan is discharge home, recommend the following:   A little help with walking and/or transfers;A lot of help with bathing/dressing/bathroom;Assistance with cooking/housework;Assist for transportation;Supervision due to cognitive status;Help with stairs or ramp for entrance     Functional Status Assessment   Patient has had a recent decline in their functional status and demonstrates the ability to make significant improvements in function in a reasonable and predictable amount of time.     Equipment Recommendations   None recommended by OT (pt has all needed DME)     Recommendations for Other Services   PT consult     Precautions/Restrictions   Precautions Precautions: Back Precaution Booklet Issued: Yes (comment) (spanish back prec provided) Recall of Precautions/Restrictions: Impaired (will need reinforcement) Required Braces or Orthoses: Other Brace Other Brace: no brace needed per MD  order Restrictions Weight Bearing Restrictions Per Provider Order: No     Mobility Bed Mobility Overal bed mobility: Needs Assistance Bed Mobility: Sidelying to Sit, Rolling Rolling: Mod assist Sidelying to sit: Mod assist            Transfers Overall transfer level: Needs assistance Equipment used: Rolling walker (2 wheels) Transfers: Sit to/from Stand Sit to Stand: Min assist                  Balance Overall balance assessment: Needs assistance Sitting-balance support: Feet supported Sitting balance-Leahy Scale: Fair Sitting balance - Comments: sits unsupported at EOB   Standing balance support: During functional activity Standing balance-Leahy Scale: Poor Standing balance comment: reliant on RW support                           ADL either performed or assessed with clinical judgement   ADL Overall ADL's : Needs assistance/impaired Eating/Feeding: Set up;Sitting   Grooming: Set up;Sitting   Upper Body Bathing: Minimal assistance;Sitting;Standing   Lower Body Bathing: Moderate assistance;Sitting/lateral leans;Sit to/from stand   Upper Body Dressing : Minimal assistance;Sitting;Standing   Lower Body Dressing: Maximal assistance;Sitting/lateral leans;Sit to/from stand   Toilet Transfer: Minimal assistance;Ambulation;Rolling walker (2 wheels);Regular Toilet   Toileting- Clothing Manipulation and Hygiene: Contact guard assist       Functional mobility during ADLs: Minimal assistance;Rolling walker (2 wheels)       Vision   Vision Assessment?: No apparent visual deficits     Perception Perception: Not tested       Praxis Praxis: Not tested       Pertinent Vitals/Pain Pain Assessment Pain Assessment: Faces Pain Score: 3  Faces Pain Scale:  Hurts little more Pain Location: back /incision Pain Descriptors / Indicators: Discomfort Pain Intervention(s): Limited activity within patient's tolerance, Monitored during session,  Repositioned     Extremity/Trunk Assessment Upper Extremity Assessment Upper Extremity Assessment: Generalized weakness   Lower Extremity Assessment Lower Extremity Assessment: Defer to PT evaluation   Cervical / Trunk Assessment Cervical / Trunk Assessment: Back Surgery   Communication Communication Communication: Other (comment) (spouse interpreting)   Cognition Arousal: Lethargic Behavior During Therapy: WFL for tasks assessed/performed Cognition: No apparent impairments             OT - Cognition Comments: slow to follow commands                 Following commands: Intact       Cueing  General Comments   Cueing Techniques: Verbal cues  SpO2 96% on RA   Exercises     Shoulder Instructions      Home Living Family/patient expects to be discharged to:: Private residence Living Arrangements: Spouse/significant other Available Help at Discharge: Family;Available 24 hours/day Type of Home: House Home Access: Ramped entrance     Home Layout: One level     Bathroom Shower/Tub: Producer, television/film/video: Handicapped height Bathroom Accessibility: Yes   Home Equipment: Agricultural consultant (2 wheels);Rollator (4 wheels);Shower seat;Grab bars - tub/shower;Cane - single point          Prior Functioning/Environment Prior Level of Function : Needs assist             Mobility Comments: SPC for mobility ADLs Comments: assist from spouse for ADLs    OT Problem List: Decreased strength;Decreased range of motion;Decreased activity tolerance;Impaired balance (sitting and/or standing);Decreased cognition;Decreased knowledge of precautions   OT Treatment/Interventions: Self-care/ADL training;Therapeutic exercise;Energy conservation;DME and/or AE instruction;Therapeutic activities;Patient/family education;Balance training      OT Goals(Current goals can be found in the care plan section)   Acute Rehab OT Goals Patient Stated Goal: none  stated OT Goal Formulation: With patient Time For Goal Achievement: 03/25/24 Potential to Achieve Goals: Good   OT Frequency:  Min 2X/week    Co-evaluation              AM-PAC OT 6 Clicks Daily Activity     Outcome Measure Help from another person eating meals?: A Little Help from another person taking care of personal grooming?: A Little Help from another person toileting, which includes using toliet, bedpan, or urinal?: A Little Help from another person bathing (including washing, rinsing, drying)?: A Lot Help from another person to put on and taking off regular upper body clothing?: A Little Help from another person to put on and taking off regular lower body clothing?: A Lot 6 Click Score: 16   End of Session Equipment Utilized During Treatment: Rolling walker (2 wheels) Nurse Communication: Mobility status  Activity Tolerance: Patient tolerated treatment well Patient left: in bed;with call bell/phone within reach;with family/visitor present (seated EOB)  OT Visit Diagnosis: Unsteadiness on feet (R26.81);Other abnormalities of gait and mobility (R26.89);Muscle weakness (generalized) (M62.81)                Time: 9189-9154 OT Time Calculation (min): 35 min Charges:  OT General Charges $OT Visit: 1 Visit OT Evaluation $OT Eval Low Complexity: 1 Low OT Treatments $Self Care/Home Management : 8-22 mins  Masoud Nyce K, OTD, OTR/L SecureChat Preferred Acute Rehab [phone]   Cerenity Goshorn K Koonce 03/11/2024, 10:05 AM

## 2024-03-11 NOTE — Evaluation (Signed)
 Physical Therapy Evaluation  Patient Details Name: Kendra [name] MRN: [id number] DOB: January 05, 1946 Today's Date: 03/11/2024  History of Present Illness  Pt is an 78 y/o F s/p L4-5 laminectomy on 8/19. PMH includes HTN, hypercholesteremia, conversion disorder, panic attacks, DVT, TIA, chornic back pain, nephrolithaisis, migraines, partial thyroidectomy, cholecystectomy.  Clinical Impression  Pt admitted with above diagnosis. At the time of PT eval, pt was able to demonstrate transfers and ambulation with up to mod assist and RW for support. Pt was educated on precautions, positioning recommendations, appropriate activity progression, and car transfer. Pt currently with functional limitations due to the deficits listed below (see PT Problem List). Pt will benefit from skilled PT to increase their independence and safety with mobility to allow discharge to the venue listed below.          If plan is discharge home, recommend the following: A little help with walking and/or transfers;A little help with bathing/dressing/bathroom;Assistance with cooking/housework;Assist for transportation;Help with stairs or ramp for entrance   Can travel by private vehicle        Equipment Recommendations None recommended by PT  Recommendations for Other Services       Functional Status Assessment Patient has had a recent decline in their functional status and demonstrates the ability to make significant improvements in function in a reasonable and predictable amount of time.     Precautions / Restrictions Precautions Precautions: Back;Fall Precaution Booklet Issued: Yes (comment) (spanish back prec provided) Recall of Precautions/Restrictions: Impaired (will need reinforcement) Precaution/Restrictions Comments: Reviewed precautions during functional mobility Required Braces or Orthoses:  (no brace needed per MD order) Restrictions Weight Bearing Restrictions Per Provider Order: No      Mobility  Bed  Mobility Overal bed mobility: Needs Assistance Bed Mobility: Sidelying to Sit, Rolling, Sit to Sidelying Rolling: Mod assist Sidelying to sit: Mod assist     Sit to sidelying: Mod assist General bed mobility comments: Assist throughout log roll for optimal technique. Poor carryover.    Transfers Overall transfer level: Needs assistance Equipment used: Rolling walker (2 wheels) Transfers: Sit to/from Stand Sit to Stand: Min assist           General transfer comment: Assist for power up to full stand. Unsteady initially but able to recover with light assist.    Ambulation/Gait Ambulation/Gait assistance: Contact guard assist Gait Distance (Feet): 100 Feet Assistive device: Rolling walker (2 wheels) Gait Pattern/deviations: Step-through pattern, Decreased stride length, Trunk flexed Gait velocity: Decreased Gait velocity interpretation: 1.31 - 2.62 ft/sec, indicative of limited community ambulator   General Gait Details: VC's for improved posture, closer walker proximity and forward gaze. Very slow but generally steady with RW for support.  Stairs            Wheelchair Mobility     Tilt Bed    Modified Rankin (Stroke Patients Only)       Balance Overall balance assessment: Needs assistance Sitting-balance support: Feet supported Sitting balance-Leahy Scale: Fair Sitting balance - Comments: sits unsupported at EOB   Standing balance support: During functional activity Standing balance-Leahy Scale: Poor Standing balance comment: reliant on RW support                             Pertinent Vitals/Pain Pain Assessment Pain Assessment: Faces Faces Pain Scale: Hurts little more Pain Location: back /incision Pain Descriptors / Indicators: Discomfort Pain Intervention(s): Limited activity within patient's tolerance, Monitored during session, Repositioned  Home Living Family/patient expects to be discharged to:: Private residence Living  Arrangements: Spouse/significant other Available Help at Discharge: Family;Available 24 hours/day Type of Home: House Home Access: Ramped entrance       Home Layout: One level Home Equipment: Agricultural consultant (2 wheels);Rollator (4 wheels);Shower seat;Grab bars - tub/shower;Cane - single point      Prior Function Prior Level of Function : Needs assist             Mobility Comments: SPC for mobility ADLs Comments: assist from spouse for ADLs     Extremity/Trunk Assessment   Upper Extremity Assessment Upper Extremity Assessment: Generalized weakness    Lower Extremity Assessment Lower Extremity Assessment: Generalized weakness    Cervical / Trunk Assessment Cervical / Trunk Assessment: Back Surgery  Communication   Communication Communication: Other (comment) (spouse interpreting)    Cognition Arousal: Lethargic Behavior During Therapy: WFL for tasks assessed/performed                             Following commands: Intact       Cueing Cueing Techniques: Verbal cues     General Comments      Exercises     Assessment/Plan    PT Assessment Patient needs continued PT services  PT Problem List Decreased strength;Decreased activity tolerance;Decreased balance;Decreased mobility;Decreased knowledge of use of DME;Decreased safety awareness;Decreased knowledge of precautions;Pain       PT Treatment Interventions DME instruction;Gait training;Stair training;Functional mobility training;Therapeutic activities;Therapeutic exercise;Balance training;Patient/family education    PT Goals (Current goals can be found in the Care Plan section)  Acute Rehab PT Goals Patient Stated Goal: None stated. Husnand is hopeful for home today. PT Goal Formulation: With patient/family Time For Goal Achievement: 03/25/24 Potential to Achieve Goals: Good    Frequency Min 5X/week     Co-evaluation               AM-PAC PT 6 Clicks Mobility  Outcome Measure  Help needed turning from your back to your side while in a flat bed without using bedrails?: A Lot Help needed moving from lying on your back to sitting on the side of a flat bed without using bedrails?: A Lot Help needed moving to and from a bed to a chair (including a wheelchair)?: A Lot Help needed standing up from a chair using your arms (e.g., wheelchair or bedside chair)?: A Little Help needed to walk in hospital room?: A Little Help needed climbing 3-5 steps with a railing? : A Lot 6 Click Score: 14    End of Session Equipment Utilized During Treatment: Gait belt Activity Tolerance: Patient tolerated treatment well Patient left: with call bell/phone within reach;in bed;with family/visitor present (Bed in chair position) Nurse Communication: Mobility status PT Visit Diagnosis: Unsteadiness on feet (R26.81);Pain Pain - part of body:  (back)    Time: 8975-8940 PT Time Calculation (min) (ACUTE ONLY): 35 min   Charges:   PT Evaluation $PT Eval Low Complexity: 1 Low PT Treatments $Gait Training: 8-22 mins PT General Charges $$ ACUTE PT VISIT: 1 Visit         Leita Sable, PT, DPT Acute Rehabilitation Services Secure Chat Preferred Office: [phone]   Leita JONETTA Sable 03/11/2024, 1:58 PM

## 2024-03-11 NOTE — Progress Notes (Signed)
 Orthopedic Surgery Post-operative Progress Note  Assessment: Patient is a 78 y.o. female who is currently admitted after undergoing L4/5 laminectomy   Plan: -Operative plans complete -Drain: none -Out of bed as tolerated, no brace -No bending/lifting/twisting greater than 10 pounds -PT evaluate and treat -Pain control -Diabetic diet (patient is not diabetic but this will limit her simple carbohydrates post-operatively since [facility] diabetic diet still allows for french toast, juice, cake, etc.) -No chemoprophylaxis for dvt or antiplatelets for 72 hours after surgery -Ancef  x2 post-operative doses -Anticipate discharge to home today  ___________________________________________________________________________   Subjective: No acute events overnight. Was able to get a good amount of sleep. Not having any radiating leg pain. Back pain well controlled.   Objective:  General: no acute distress, appropriate affect Neurologic: alert, answering questions appropriately, following commands Respiratory: unlabored breathing on room air Skin: dressing clear/dry/intact  MSK (spine):  -Strength exam      Right  Left  EHL    5/5  5/5 TA    5/5  5/5 GSC    5/5  5/5 Knee extension  5/5  5/5 Hip flexion   5/5  5/5  -Sensory exam    Sensation intact to light touch in L3-S1 nerve distributions of bilateral lower extremities   Yesterday's total administered Morphine Milligram Equivalents: 60   Patient name: Kendra [name] Patient MRN: [id number] Date: 03/11/24

## 2024-03-11 NOTE — Care Management Obs Status (Signed)
 MEDICARE OBSERVATION STATUS NOTIFICATION   Patient Details  Name: Kendra [name] MRN: [id number] Date of Birth: November 16, 1945   Medicare Observation Status Notification Given:  Yes    Jon Cruel 03/11/2024, 9:45 AM

## 2024-03-13 DIAGNOSIS — Z4789 Encounter for other orthopedic aftercare: Secondary | ICD-10-CM | POA: Diagnosis not present

## 2024-03-13 DIAGNOSIS — G43909 Migraine, unspecified, not intractable, without status migrainosus: Secondary | ICD-10-CM | POA: Diagnosis not present

## 2024-03-13 DIAGNOSIS — I10 Essential (primary) hypertension: Secondary | ICD-10-CM | POA: Diagnosis not present

## 2024-03-13 DIAGNOSIS — E7800 Pure hypercholesterolemia, unspecified: Secondary | ICD-10-CM | POA: Diagnosis not present

## 2024-03-13 DIAGNOSIS — Z86718 Personal history of other venous thrombosis and embolism: Secondary | ICD-10-CM | POA: Diagnosis not present

## 2024-03-13 DIAGNOSIS — Z8673 Personal history of transient ischemic attack (TIA), and cerebral infarction without residual deficits: Secondary | ICD-10-CM | POA: Diagnosis not present

## 2024-03-13 DIAGNOSIS — F410 Panic disorder [episodic paroxysmal anxiety] without agoraphobia: Secondary | ICD-10-CM | POA: Diagnosis not present

## 2024-03-13 DIAGNOSIS — G8929 Other chronic pain: Secondary | ICD-10-CM | POA: Diagnosis not present

## 2024-03-13 DIAGNOSIS — M4726 Other spondylosis with radiculopathy, lumbar region: Secondary | ICD-10-CM | POA: Diagnosis not present

## 2024-03-16 DIAGNOSIS — Z4789 Encounter for other orthopedic aftercare: Secondary | ICD-10-CM | POA: Diagnosis not present

## 2024-03-16 DIAGNOSIS — M4726 Other spondylosis with radiculopathy, lumbar region: Secondary | ICD-10-CM | POA: Diagnosis not present

## 2024-03-16 DIAGNOSIS — Z86718 Personal history of other venous thrombosis and embolism: Secondary | ICD-10-CM | POA: Diagnosis not present

## 2024-03-16 DIAGNOSIS — Z8673 Personal history of transient ischemic attack (TIA), and cerebral infarction without residual deficits: Secondary | ICD-10-CM | POA: Diagnosis not present

## 2024-03-16 DIAGNOSIS — I10 Essential (primary) hypertension: Secondary | ICD-10-CM | POA: Diagnosis not present

## 2024-03-16 DIAGNOSIS — F410 Panic disorder [episodic paroxysmal anxiety] without agoraphobia: Secondary | ICD-10-CM | POA: Diagnosis not present

## 2024-03-16 DIAGNOSIS — E7800 Pure hypercholesterolemia, unspecified: Secondary | ICD-10-CM | POA: Diagnosis not present

## 2024-03-16 DIAGNOSIS — G8929 Other chronic pain: Secondary | ICD-10-CM | POA: Diagnosis not present

## 2024-03-16 DIAGNOSIS — G43909 Migraine, unspecified, not intractable, without status migrainosus: Secondary | ICD-10-CM | POA: Diagnosis not present

## 2024-03-17 DIAGNOSIS — E7800 Pure hypercholesterolemia, unspecified: Secondary | ICD-10-CM | POA: Diagnosis not present

## 2024-03-17 DIAGNOSIS — G43909 Migraine, unspecified, not intractable, without status migrainosus: Secondary | ICD-10-CM | POA: Diagnosis not present

## 2024-03-17 DIAGNOSIS — Z4789 Encounter for other orthopedic aftercare: Secondary | ICD-10-CM | POA: Diagnosis not present

## 2024-03-17 DIAGNOSIS — G8929 Other chronic pain: Secondary | ICD-10-CM | POA: Diagnosis not present

## 2024-03-17 DIAGNOSIS — I10 Essential (primary) hypertension: Secondary | ICD-10-CM | POA: Diagnosis not present

## 2024-03-17 DIAGNOSIS — Z8673 Personal history of transient ischemic attack (TIA), and cerebral infarction without residual deficits: Secondary | ICD-10-CM | POA: Diagnosis not present

## 2024-03-17 DIAGNOSIS — Z86718 Personal history of other venous thrombosis and embolism: Secondary | ICD-10-CM | POA: Diagnosis not present

## 2024-03-17 DIAGNOSIS — M4726 Other spondylosis with radiculopathy, lumbar region: Secondary | ICD-10-CM | POA: Diagnosis not present

## 2024-03-17 DIAGNOSIS — F410 Panic disorder [episodic paroxysmal anxiety] without agoraphobia: Secondary | ICD-10-CM | POA: Diagnosis not present

## 2024-03-18 ENCOUNTER — Telehealth: Payer: Self-pay

## 2024-03-18 DIAGNOSIS — E7800 Pure hypercholesterolemia, unspecified: Secondary | ICD-10-CM | POA: Diagnosis not present

## 2024-03-18 DIAGNOSIS — G8929 Other chronic pain: Secondary | ICD-10-CM | POA: Diagnosis not present

## 2024-03-18 DIAGNOSIS — Z4789 Encounter for other orthopedic aftercare: Secondary | ICD-10-CM | POA: Diagnosis not present

## 2024-03-18 DIAGNOSIS — G43909 Migraine, unspecified, not intractable, without status migrainosus: Secondary | ICD-10-CM | POA: Diagnosis not present

## 2024-03-18 DIAGNOSIS — Z8673 Personal history of transient ischemic attack (TIA), and cerebral infarction without residual deficits: Secondary | ICD-10-CM | POA: Diagnosis not present

## 2024-03-18 DIAGNOSIS — Z86718 Personal history of other venous thrombosis and embolism: Secondary | ICD-10-CM | POA: Diagnosis not present

## 2024-03-18 DIAGNOSIS — I10 Essential (primary) hypertension: Secondary | ICD-10-CM | POA: Diagnosis not present

## 2024-03-18 DIAGNOSIS — M4726 Other spondylosis with radiculopathy, lumbar region: Secondary | ICD-10-CM | POA: Diagnosis not present

## 2024-03-18 DIAGNOSIS — F410 Panic disorder [episodic paroxysmal anxiety] without agoraphobia: Secondary | ICD-10-CM | POA: Diagnosis not present

## 2024-03-18 NOTE — Telephone Encounter (Signed)
 Communication  Caller/Agency:Don Rudolpho with [facility] Nurses    Callback Number: [phone]    Service Requested: Occupational Therapy    Frequency: 1 x a week for 2 weeks for mobility, exercise, safety, pain control, health promotion and ulcer prevention.    Any new concerns about the patient? Yes he states the patient has not been taking any of her regular meds since being discharged from the hospital. He says she was discharged with a med list from [facility] on 8/19 and he instructed her to resume her meds as indicated on the discharge papers. He also wanted to report her bp on arrival was 130/88 and when he left it was 124/80.        Please assist patient further

## 2024-03-19 DIAGNOSIS — Z4789 Encounter for other orthopedic aftercare: Secondary | ICD-10-CM | POA: Diagnosis not present

## 2024-03-19 DIAGNOSIS — Z86718 Personal history of other venous thrombosis and embolism: Secondary | ICD-10-CM | POA: Diagnosis not present

## 2024-03-19 DIAGNOSIS — Z8673 Personal history of transient ischemic attack (TIA), and cerebral infarction without residual deficits: Secondary | ICD-10-CM | POA: Diagnosis not present

## 2024-03-19 DIAGNOSIS — F410 Panic disorder [episodic paroxysmal anxiety] without agoraphobia: Secondary | ICD-10-CM | POA: Diagnosis not present

## 2024-03-19 DIAGNOSIS — G8929 Other chronic pain: Secondary | ICD-10-CM | POA: Diagnosis not present

## 2024-03-19 DIAGNOSIS — G43909 Migraine, unspecified, not intractable, without status migrainosus: Secondary | ICD-10-CM | POA: Diagnosis not present

## 2024-03-19 DIAGNOSIS — E7800 Pure hypercholesterolemia, unspecified: Secondary | ICD-10-CM | POA: Diagnosis not present

## 2024-03-19 DIAGNOSIS — I10 Essential (primary) hypertension: Secondary | ICD-10-CM | POA: Diagnosis not present

## 2024-03-19 DIAGNOSIS — M4726 Other spondylosis with radiculopathy, lumbar region: Secondary | ICD-10-CM | POA: Diagnosis not present

## 2024-03-20 DIAGNOSIS — G43909 Migraine, unspecified, not intractable, without status migrainosus: Secondary | ICD-10-CM | POA: Diagnosis not present

## 2024-03-20 DIAGNOSIS — F410 Panic disorder [episodic paroxysmal anxiety] without agoraphobia: Secondary | ICD-10-CM | POA: Diagnosis not present

## 2024-03-20 DIAGNOSIS — I10 Essential (primary) hypertension: Secondary | ICD-10-CM | POA: Diagnosis not present

## 2024-03-20 DIAGNOSIS — E7800 Pure hypercholesterolemia, unspecified: Secondary | ICD-10-CM | POA: Diagnosis not present

## 2024-03-20 DIAGNOSIS — Z4789 Encounter for other orthopedic aftercare: Secondary | ICD-10-CM | POA: Diagnosis not present

## 2024-03-20 DIAGNOSIS — Z8673 Personal history of transient ischemic attack (TIA), and cerebral infarction without residual deficits: Secondary | ICD-10-CM | POA: Diagnosis not present

## 2024-03-20 DIAGNOSIS — M4726 Other spondylosis with radiculopathy, lumbar region: Secondary | ICD-10-CM | POA: Diagnosis not present

## 2024-03-20 DIAGNOSIS — Z86718 Personal history of other venous thrombosis and embolism: Secondary | ICD-10-CM | POA: Diagnosis not present

## 2024-03-20 DIAGNOSIS — G8929 Other chronic pain: Secondary | ICD-10-CM | POA: Diagnosis not present

## 2024-03-20 NOTE — Telephone Encounter (Signed)
 Left message for verbal orders on secured voicemail [facility] for OT

## 2024-03-24 ENCOUNTER — Telehealth: Payer: Self-pay | Admitting: Orthopedic Surgery

## 2024-03-24 ENCOUNTER — Other Ambulatory Visit: Payer: Self-pay | Admitting: Nurse Practitioner

## 2024-03-24 DIAGNOSIS — I10 Essential (primary) hypertension: Secondary | ICD-10-CM

## 2024-03-24 DIAGNOSIS — G8929 Other chronic pain: Secondary | ICD-10-CM | POA: Diagnosis not present

## 2024-03-24 DIAGNOSIS — Z4789 Encounter for other orthopedic aftercare: Secondary | ICD-10-CM | POA: Diagnosis not present

## 2024-03-24 DIAGNOSIS — F410 Panic disorder [episodic paroxysmal anxiety] without agoraphobia: Secondary | ICD-10-CM | POA: Diagnosis not present

## 2024-03-24 DIAGNOSIS — E7800 Pure hypercholesterolemia, unspecified: Secondary | ICD-10-CM | POA: Diagnosis not present

## 2024-03-24 DIAGNOSIS — G43909 Migraine, unspecified, not intractable, without status migrainosus: Secondary | ICD-10-CM | POA: Diagnosis not present

## 2024-03-24 DIAGNOSIS — Z8673 Personal history of transient ischemic attack (TIA), and cerebral infarction without residual deficits: Secondary | ICD-10-CM | POA: Diagnosis not present

## 2024-03-24 DIAGNOSIS — M4726 Other spondylosis with radiculopathy, lumbar region: Secondary | ICD-10-CM | POA: Diagnosis not present

## 2024-03-24 DIAGNOSIS — Z86718 Personal history of other venous thrombosis and embolism: Secondary | ICD-10-CM | POA: Diagnosis not present

## 2024-03-24 NOTE — Telephone Encounter (Signed)
 I called and lmom giving verbal orders

## 2024-03-24 NOTE — Telephone Encounter (Signed)
 Don from [facility] called and said that he needs verbal orders for 1 wk 2, pain control, mobility, safety, and exercise. CB# [phone]

## 2024-03-25 ENCOUNTER — Ambulatory Visit (INDEPENDENT_AMBULATORY_CARE_PROVIDER_SITE_OTHER): Admitting: Orthopedic Surgery

## 2024-03-25 DIAGNOSIS — I10 Essential (primary) hypertension: Secondary | ICD-10-CM | POA: Diagnosis not present

## 2024-03-25 DIAGNOSIS — Z4789 Encounter for other orthopedic aftercare: Secondary | ICD-10-CM | POA: Diagnosis not present

## 2024-03-25 DIAGNOSIS — M4726 Other spondylosis with radiculopathy, lumbar region: Secondary | ICD-10-CM | POA: Diagnosis not present

## 2024-03-25 DIAGNOSIS — Z86718 Personal history of other venous thrombosis and embolism: Secondary | ICD-10-CM | POA: Diagnosis not present

## 2024-03-25 DIAGNOSIS — F410 Panic disorder [episodic paroxysmal anxiety] without agoraphobia: Secondary | ICD-10-CM | POA: Diagnosis not present

## 2024-03-25 DIAGNOSIS — Z9889 Other specified postprocedural states: Secondary | ICD-10-CM

## 2024-03-25 DIAGNOSIS — E7800 Pure hypercholesterolemia, unspecified: Secondary | ICD-10-CM | POA: Diagnosis not present

## 2024-03-25 DIAGNOSIS — G43909 Migraine, unspecified, not intractable, without status migrainosus: Secondary | ICD-10-CM | POA: Diagnosis not present

## 2024-03-25 DIAGNOSIS — G8929 Other chronic pain: Secondary | ICD-10-CM | POA: Diagnosis not present

## 2024-03-25 DIAGNOSIS — Z8673 Personal history of transient ischemic attack (TIA), and cerebral infarction without residual deficits: Secondary | ICD-10-CM | POA: Diagnosis not present

## 2024-03-25 NOTE — Progress Notes (Signed)
 Orthopedic Surgery Post-operative Office Visit  Procedure: L4/5 laminectomy Date of Surgery: 03/10/2024 (~2 weeks post-op)  Assessment: Patient is a 78 y.o. who is doing well after surgery   Plan: -Operative plans complete -Out of bed as tolerated, no brace -Okay to let soap/water run over the incision but do not submerge -No bending/lifting/twisting greater than 10 pounds -Pain management: tylenol  as needed -Return to office in 4 weeks, x-rays needed at next visit: none  ___________________________________________________________________________   Subjective: Patient has been doing well since discharge from the hospital.  She is feeling much better.  She is not having any back pain.  She has some mild pain over the lateral aspect of her thighs.  This is also improved since surgery.  She does not have any severe or significant pain in her legs.  She is only using Tylenol  for pain control.  She has not noticed any redness or drainage around her incision.  She is pleased with how she is doing postoperatively.  Objective:  General: no acute distress, appropriate affect Neurologic: alert, answering questions appropriately, following commands Respiratory: unlabored breathing on room air Skin: incision is well approximated with no erythema, induration, active/expressible drainage  MSK (spine):  -Strength exam      Left  Right  EHL    5/5  5/5 TA    5/5  5/5 GSC    5/5  5/5 Knee extension  5/5  5/5 Hip flexion   5/5  5/5  -Sensory exam    Sensation intact to light touch in L3-S1 nerve distributions of bilateral lower extremities  Imaging: None obtained at today's visit    Patient name: Kendra [name] Patient MRN: [id number] Date of visit: 03/25/24

## 2024-03-26 DIAGNOSIS — M4726 Other spondylosis with radiculopathy, lumbar region: Secondary | ICD-10-CM | POA: Diagnosis not present

## 2024-03-26 DIAGNOSIS — G8929 Other chronic pain: Secondary | ICD-10-CM | POA: Diagnosis not present

## 2024-03-26 DIAGNOSIS — Z86718 Personal history of other venous thrombosis and embolism: Secondary | ICD-10-CM | POA: Diagnosis not present

## 2024-03-26 DIAGNOSIS — E7800 Pure hypercholesterolemia, unspecified: Secondary | ICD-10-CM | POA: Diagnosis not present

## 2024-03-26 DIAGNOSIS — I10 Essential (primary) hypertension: Secondary | ICD-10-CM | POA: Diagnosis not present

## 2024-03-26 DIAGNOSIS — Z8673 Personal history of transient ischemic attack (TIA), and cerebral infarction without residual deficits: Secondary | ICD-10-CM | POA: Diagnosis not present

## 2024-03-26 DIAGNOSIS — Z4789 Encounter for other orthopedic aftercare: Secondary | ICD-10-CM | POA: Diagnosis not present

## 2024-03-26 DIAGNOSIS — F410 Panic disorder [episodic paroxysmal anxiety] without agoraphobia: Secondary | ICD-10-CM | POA: Diagnosis not present

## 2024-03-26 DIAGNOSIS — G43909 Migraine, unspecified, not intractable, without status migrainosus: Secondary | ICD-10-CM | POA: Diagnosis not present

## 2024-03-31 DIAGNOSIS — G8929 Other chronic pain: Secondary | ICD-10-CM | POA: Diagnosis not present

## 2024-03-31 DIAGNOSIS — G43909 Migraine, unspecified, not intractable, without status migrainosus: Secondary | ICD-10-CM | POA: Diagnosis not present

## 2024-03-31 DIAGNOSIS — M4726 Other spondylosis with radiculopathy, lumbar region: Secondary | ICD-10-CM | POA: Diagnosis not present

## 2024-03-31 DIAGNOSIS — Z8673 Personal history of transient ischemic attack (TIA), and cerebral infarction without residual deficits: Secondary | ICD-10-CM | POA: Diagnosis not present

## 2024-03-31 DIAGNOSIS — Z86718 Personal history of other venous thrombosis and embolism: Secondary | ICD-10-CM | POA: Diagnosis not present

## 2024-03-31 DIAGNOSIS — E7800 Pure hypercholesterolemia, unspecified: Secondary | ICD-10-CM | POA: Diagnosis not present

## 2024-03-31 DIAGNOSIS — I10 Essential (primary) hypertension: Secondary | ICD-10-CM | POA: Diagnosis not present

## 2024-03-31 DIAGNOSIS — F410 Panic disorder [episodic paroxysmal anxiety] without agoraphobia: Secondary | ICD-10-CM | POA: Diagnosis not present

## 2024-03-31 DIAGNOSIS — Z4789 Encounter for other orthopedic aftercare: Secondary | ICD-10-CM | POA: Diagnosis not present

## 2024-04-03 DIAGNOSIS — Z4789 Encounter for other orthopedic aftercare: Secondary | ICD-10-CM | POA: Diagnosis not present

## 2024-04-03 DIAGNOSIS — F410 Panic disorder [episodic paroxysmal anxiety] without agoraphobia: Secondary | ICD-10-CM | POA: Diagnosis not present

## 2024-04-03 DIAGNOSIS — G8929 Other chronic pain: Secondary | ICD-10-CM | POA: Diagnosis not present

## 2024-04-03 DIAGNOSIS — G43909 Migraine, unspecified, not intractable, without status migrainosus: Secondary | ICD-10-CM | POA: Diagnosis not present

## 2024-04-03 DIAGNOSIS — M4726 Other spondylosis with radiculopathy, lumbar region: Secondary | ICD-10-CM | POA: Diagnosis not present

## 2024-04-03 DIAGNOSIS — E7800 Pure hypercholesterolemia, unspecified: Secondary | ICD-10-CM | POA: Diagnosis not present

## 2024-04-03 DIAGNOSIS — Z86718 Personal history of other venous thrombosis and embolism: Secondary | ICD-10-CM | POA: Diagnosis not present

## 2024-04-03 DIAGNOSIS — Z8673 Personal history of transient ischemic attack (TIA), and cerebral infarction without residual deficits: Secondary | ICD-10-CM | POA: Diagnosis not present

## 2024-04-03 DIAGNOSIS — I10 Essential (primary) hypertension: Secondary | ICD-10-CM | POA: Diagnosis not present

## 2024-04-06 DIAGNOSIS — Z86718 Personal history of other venous thrombosis and embolism: Secondary | ICD-10-CM | POA: Diagnosis not present

## 2024-04-06 DIAGNOSIS — M4726 Other spondylosis with radiculopathy, lumbar region: Secondary | ICD-10-CM | POA: Diagnosis not present

## 2024-04-06 DIAGNOSIS — G8929 Other chronic pain: Secondary | ICD-10-CM | POA: Diagnosis not present

## 2024-04-06 DIAGNOSIS — G43909 Migraine, unspecified, not intractable, without status migrainosus: Secondary | ICD-10-CM | POA: Diagnosis not present

## 2024-04-06 DIAGNOSIS — Z8673 Personal history of transient ischemic attack (TIA), and cerebral infarction without residual deficits: Secondary | ICD-10-CM | POA: Diagnosis not present

## 2024-04-06 DIAGNOSIS — Z4789 Encounter for other orthopedic aftercare: Secondary | ICD-10-CM | POA: Diagnosis not present

## 2024-04-06 DIAGNOSIS — E7800 Pure hypercholesterolemia, unspecified: Secondary | ICD-10-CM | POA: Diagnosis not present

## 2024-04-06 DIAGNOSIS — I10 Essential (primary) hypertension: Secondary | ICD-10-CM | POA: Diagnosis not present

## 2024-04-06 DIAGNOSIS — F410 Panic disorder [episodic paroxysmal anxiety] without agoraphobia: Secondary | ICD-10-CM | POA: Diagnosis not present

## 2024-04-07 ENCOUNTER — Telehealth: Payer: Self-pay | Admitting: Family Medicine

## 2024-04-07 DIAGNOSIS — I10 Essential (primary) hypertension: Secondary | ICD-10-CM

## 2024-04-07 MED ORDER — PROPRANOLOL HCL 20 MG PO TABS: 20.0000 mg | ORAL_TABLET | Freq: Every day | ORAL | 0 refills | Status: DC

## 2024-04-07 MED ORDER — AMLODIPINE BESYLATE 2.5 MG PO TABS: 2.5000 mg | ORAL_TABLET | Freq: Every day | ORAL | 0 refills | Status: DC

## 2024-04-07 NOTE — Telephone Encounter (Signed)
 Refill on  propranolol  (INDERAL ) 20 MG tablet   amLODipine  (NORVASC ) 2.5 MG tablet   Clobetasol  propionate ointment [facility]

## 2024-04-07 NOTE — Telephone Encounter (Signed)
 Refills sent patient needs follow up

## 2024-04-13 ENCOUNTER — Telehealth: Payer: Self-pay | Admitting: Obstetrics & Gynecology

## 2024-04-13 NOTE — Telephone Encounter (Signed)
 Spoke with patients husband she has been wearing diapers and now has a rash. I recommended using zinc diaper rash cream and if not improvement to let us  know. No other questions at this time.

## 2024-04-13 NOTE — Telephone Encounter (Signed)
 Patient had surgery and now she has a rash and is wanting to know if she can get a refill on clobetasol  propionate 0.05 cream script to [facility] in [place]

## 2024-04-17 DIAGNOSIS — Z8673 Personal history of transient ischemic attack (TIA), and cerebral infarction without residual deficits: Secondary | ICD-10-CM | POA: Diagnosis not present

## 2024-04-17 DIAGNOSIS — G43909 Migraine, unspecified, not intractable, without status migrainosus: Secondary | ICD-10-CM | POA: Diagnosis not present

## 2024-04-17 DIAGNOSIS — F410 Panic disorder [episodic paroxysmal anxiety] without agoraphobia: Secondary | ICD-10-CM | POA: Diagnosis not present

## 2024-04-17 DIAGNOSIS — Z4789 Encounter for other orthopedic aftercare: Secondary | ICD-10-CM | POA: Diagnosis not present

## 2024-04-17 DIAGNOSIS — Z86718 Personal history of other venous thrombosis and embolism: Secondary | ICD-10-CM | POA: Diagnosis not present

## 2024-04-17 DIAGNOSIS — E7800 Pure hypercholesterolemia, unspecified: Secondary | ICD-10-CM | POA: Diagnosis not present

## 2024-04-17 DIAGNOSIS — G8929 Other chronic pain: Secondary | ICD-10-CM | POA: Diagnosis not present

## 2024-04-17 DIAGNOSIS — I10 Essential (primary) hypertension: Secondary | ICD-10-CM | POA: Diagnosis not present

## 2024-04-17 DIAGNOSIS — M4726 Other spondylosis with radiculopathy, lumbar region: Secondary | ICD-10-CM | POA: Diagnosis not present

## 2024-04-23 ENCOUNTER — Telehealth: Payer: Self-pay

## 2024-04-23 ENCOUNTER — Encounter: Admitting: Orthopedic Surgery

## 2024-04-23 NOTE — Telephone Encounter (Signed)
 FYI husband called stating patient is sick and they will call back to reschedule

## 2024-04-29 DIAGNOSIS — Z4789 Encounter for other orthopedic aftercare: Secondary | ICD-10-CM | POA: Diagnosis not present

## 2024-04-29 DIAGNOSIS — G8929 Other chronic pain: Secondary | ICD-10-CM | POA: Diagnosis not present

## 2024-04-29 DIAGNOSIS — G43909 Migraine, unspecified, not intractable, without status migrainosus: Secondary | ICD-10-CM | POA: Diagnosis not present

## 2024-04-29 DIAGNOSIS — Z86718 Personal history of other venous thrombosis and embolism: Secondary | ICD-10-CM | POA: Diagnosis not present

## 2024-04-29 DIAGNOSIS — M4726 Other spondylosis with radiculopathy, lumbar region: Secondary | ICD-10-CM | POA: Diagnosis not present

## 2024-04-29 DIAGNOSIS — E7800 Pure hypercholesterolemia, unspecified: Secondary | ICD-10-CM | POA: Diagnosis not present

## 2024-04-29 DIAGNOSIS — Z8673 Personal history of transient ischemic attack (TIA), and cerebral infarction without residual deficits: Secondary | ICD-10-CM | POA: Diagnosis not present

## 2024-04-29 DIAGNOSIS — I10 Essential (primary) hypertension: Secondary | ICD-10-CM | POA: Diagnosis not present

## 2024-04-29 DIAGNOSIS — F410 Panic disorder [episodic paroxysmal anxiety] without agoraphobia: Secondary | ICD-10-CM | POA: Diagnosis not present

## 2024-05-11 ENCOUNTER — Ambulatory Visit (INDEPENDENT_AMBULATORY_CARE_PROVIDER_SITE_OTHER): Admitting: Orthopedic Surgery

## 2024-05-11 DIAGNOSIS — Z9889 Other specified postprocedural states: Secondary | ICD-10-CM

## 2024-05-11 NOTE — Progress Notes (Signed)
 Orthopedic Surgery Post-operative Office Visit   Procedure: L4/5 laminectomy Date of Surgery: 03/10/2024 (~6 weeks post-op)   Assessment: Patient is a 78 y.o. who is doing well after surgery     Plan: -No spine specific precautions -Okay to submerge wound at this time -Pain management: tylenol  as needed -Return to office in 6 weeks, x-rays needed at next visit: AP/lateral/flex/ex lumbar and AP/lateral/flex/ex cervical   ___________________________________________________________________________     Subjective: Patient is not having any radiating leg pain. She does have neck and back pain. She has been walking around the house. She is pleased with her improvement since surgery. Has not noticed any redness or drainage around her incision.   She is also having some lower cervical pain. No radiating arm pain. Sometimes that pain feels like it radiates down to her lumbar spine.    Objective:   General: no acute distress, appropriate affect Neurologic: alert, answering questions appropriately, following commands Respiratory: unlabored breathing on room air Skin: incision is well healed with no erythema, induration, active/expressible drainage   MSK (spine):   -Strength exam                                                   Left                  Right   EHL                              5/5                  5/5 TA                                 5/5                  5/5 GSC                             5/5                  5/5 Knee extension            5/5                  5/5 Hip flexion                    5/5                  5/5   -Sensory exam                           Sensation intact to light touch in L3-S1 nerve distributions of bilateral lower extremities   Imaging: None obtained at today's visit      Patient name: Kendra [name] Patient MRN: [id number] Date of visit: 05/11/24

## 2024-05-19 ENCOUNTER — Encounter: Payer: Self-pay | Admitting: Family Medicine

## 2024-05-19 ENCOUNTER — Ambulatory Visit: Admitting: Family Medicine

## 2024-05-19 VITALS — BP 142/88 | HR 65 | Ht 63.0 in | Wt 206.0 lb

## 2024-05-19 DIAGNOSIS — N904 Leukoplakia of vulva: Secondary | ICD-10-CM | POA: Diagnosis not present

## 2024-05-19 DIAGNOSIS — Z23 Encounter for immunization: Secondary | ICD-10-CM

## 2024-05-19 DIAGNOSIS — Z0001 Encounter for general adult medical examination with abnormal findings: Secondary | ICD-10-CM | POA: Diagnosis not present

## 2024-05-19 DIAGNOSIS — G8929 Other chronic pain: Secondary | ICD-10-CM | POA: Diagnosis not present

## 2024-05-19 DIAGNOSIS — M545 Low back pain, unspecified: Secondary | ICD-10-CM | POA: Diagnosis not present

## 2024-05-19 DIAGNOSIS — Z0000 Encounter for general adult medical examination without abnormal findings: Secondary | ICD-10-CM

## 2024-05-19 MED ORDER — CELECOXIB 100 MG PO CAPS: 100.0000 mg | ORAL_CAPSULE | Freq: Two times a day (BID) | ORAL | 1 refills | Status: AC

## 2024-05-19 MED ORDER — CLOBETASOL PROPIONATE 0.05 % EX OINT: 1.0000 | TOPICAL_OINTMENT | Freq: Every day | CUTANEOUS | 3 refills | Status: AC

## 2024-05-19 MED ORDER — BACLOFEN 10 MG PO TABS: 5.0000 mg | ORAL_TABLET | Freq: Three times a day (TID) | ORAL | 0 refills | Status: AC | PRN

## 2024-05-19 NOTE — Patient Instructions (Signed)
Medications as directed.  Follow up in 6 months.  Take care  Dr. Lacinda Axon

## 2024-05-20 ENCOUNTER — Encounter: Payer: Self-pay | Admitting: Family Medicine

## 2024-05-20 DIAGNOSIS — Z0000 Encounter for general adult medical examination without abnormal findings: Secondary | ICD-10-CM | POA: Insufficient documentation

## 2024-05-20 NOTE — Assessment & Plan Note (Signed)
Clobetasol refilled.

## 2024-05-20 NOTE — Assessment & Plan Note (Signed)
 Chronic problem.  Recent surgery.  Acutely worsening over the past 2 weeks.  Celebrex  and baclofen  as directed.

## 2024-05-20 NOTE — Progress Notes (Signed)
 Subjective:  Patient ID: Kendra [name], female    DOB: 08-Mar-1946  Age: 78 y.o. MRN: [id number]  CC:   Chief Complaint  Patient presents with   Annual Exam    HPI:  78 year old female presents for an exam.  Patient in need of flu vaccine today.  She is amenable to this vaccine today.  Labs up-to-date.  Patient has had recent lumbar surgery.  She states that she has recently been experiencing worsening back pain.  Pain has been severe at times.  Was recently seen by her surgeon on 10/20.  Additionally, patient states that she is having difficulty with itching in the vaginal region.  Has known lichen sclerosis.  Needs medication refill.  Patient Active Problem List   Diagnosis Date Noted   Annual physical exam 05/20/2024   S/P lumbar laminectomy 03/10/2024   Lichen sclerosus of vulva 02/12/2023   Prediabetes 11/08/2021   Chronic migraine without aura 08/10/2021   Chronic low back pain 08/10/2021   Conversion disorder    History of DVT (deep vein thrombosis) 12/26/2017   Essential hypertension 12/25/2017   Mixed hyperlipidemia 12/25/2017   Depression 03/17/2010   OSA on CPAP 10/12/2009   GERD 03/07/2007    Social Hx   Social History   Socioeconomic History   Marital status: Married    Spouse name: Not on file   Number of children: Not on file   Years of education: Not on file   Highest education level: Not on file  Occupational History   Not on file  Tobacco Use   Smoking status: Never   Smokeless tobacco: Never  Vaping Use   Vaping status: Never Used  Substance and Sexual Activity   Alcohol  use: No   Drug use: No   Sexual activity: Never    Birth control/protection: None  Other Topics Concern   Not on file  Social History Narrative   Not on file   Social Drivers of Health   Financial Resource Strain: Not on file  Food Insecurity: Not on file  Transportation Needs: Not on file  Physical Activity: Not on file  Stress: Not on file  Social  Connections: Not on file    Review of Systems Per HPI  Objective:  BP (!) 142/88   Pulse 65   Ht 5' 3 (1.6 m)   Wt 206 lb (93.4 kg)   SpO2 94%   BMI 36.49 kg/m      05/19/2024    2:16 PM 03/11/2024    7:30 AM 03/11/2024    5:58 AM  BP/Weight  Systolic BP 142 152 159  Diastolic BP 88 82 81  Wt. (Lbs) 206    BMI 36.49 kg/m2      Physical Exam Vitals and nursing note reviewed.  Constitutional:      General: She is not in acute distress.    Appearance: Normal appearance.  HENT:     Head: Normocephalic and atraumatic.  Eyes:     General:        Right eye: No discharge.        Left eye: No discharge.     Conjunctiva/sclera: Conjunctivae normal.  Cardiovascular:     Rate and Rhythm: Normal rate and regular rhythm.  Pulmonary:     Effort: Pulmonary effort is normal.     Breath sounds: Normal breath sounds. No wheezing, rhonchi or rales.  Neurological:     Mental Status: She is alert.  Psychiatric:     Comments: Flat  affect.     Lab Results  Component Value Date   WBC 7.7 08/15/2023   HGB 14.3 03/10/2024   HCT 42.0 03/10/2024   PLT 299 08/15/2023   GLUCOSE 147 (H) 03/11/2024   CHOL 167 08/15/2023   TRIG 122 08/15/2023   HDL 55 08/15/2023   LDLCALC 90 08/15/2023   ALT 37 (H) 09/30/2023   AST 31 09/30/2023   NA 138 03/11/2024   K 3.9 03/11/2024   CL 100 03/11/2024   CREATININE 0.76 03/11/2024   BUN 8 03/11/2024   CO2 25 03/11/2024   TSH 0.995 02/14/2022   INR 1.0 02/13/2022   HGBA1C 6.2 (H) 08/15/2023     Assessment & Plan:  Annual physical exam Assessment & Plan: Flu vaccine given.  Labs are current. Unsure of last tetanus.  Unsure of prior shingle vaccine.  Remainder of preventative health care up-to-date.   Encounter for immunization -     Flu vaccine HIGH DOSE PF(Fluzone Trivalent)  Chronic low back pain, unspecified back pain laterality, unspecified whether sciatica present Assessment & Plan: Chronic problem.  Recent surgery.  Acutely  worsening over the past 2 weeks.  Celebrex  and baclofen  as directed.  Orders: -     Celecoxib ; Take 1 capsule (100 mg total) by mouth 2 (two) times daily.  Dispense: 60 capsule; Refill: 1 -     Baclofen ; Take 0.5 tablets (5 mg total) by mouth 3 (three) times daily as needed for muscle spasms.  Dispense: 30 each; Refill: 0  Lichen sclerosus of vulva Assessment & Plan: Clobetasol  refilled.  Orders: -     Clobetasol  Propionate; Apply 1 Application topically at bedtime. For 12 weeks. Then twice a week.  Dispense: 60 g; Refill: 3    Follow-up:  6 months  Barrington Worley Bluford DO [facility]

## 2024-05-20 NOTE — Assessment & Plan Note (Addendum)
 Flu vaccine given.  Labs are current. Unsure of last tetanus.  Unsure of prior shingle vaccine.  Remainder of preventative health care up-to-date.

## 2024-05-25 ENCOUNTER — Ambulatory Visit (HOSPITAL_COMMUNITY): Admitting: Registered Nurse

## 2024-06-03 ENCOUNTER — Ambulatory Visit: Admitting: Orthopedic Surgery

## 2024-06-03 DIAGNOSIS — M545 Low back pain, unspecified: Secondary | ICD-10-CM

## 2024-06-03 MED ORDER — TRAMADOL HCL 50 MG PO TABS: 50.0000 mg | ORAL_TABLET | Freq: Four times a day (QID) | ORAL | 0 refills | Status: AC | PRN

## 2024-06-03 NOTE — Progress Notes (Signed)
 Orthopedic Surgery Post-operative Office Visit   Procedure: L4/5 laminectomy Date of Surgery: 03/10/2024 (~10 weeks post-op)   Assessment: Patient is a 78 y.o. who has resolution of her radiating leg pain.  However, she is having more significant left-sided low back pain     Plan: -No spine specific precautions -Okay to submerge wound at this time -Can continue with the baclofen  and celebrex . Prescribed tramadol  for additional pain relief  -Return to office in 4 weeks, x-rays needed at next visit: AP/lateral/flex/ex lumbar   ___________________________________________________________________________     Subjective: Patient has noticed worsening low back pain in the last couple of weeks.  She feels it on the left side of the lumbar spine.  She has no pain radiating into either lower extremity.  She has been using baclofen  and Celebrex  which were prescribed by her primary care doctor.  She has found those helpful.  Pain is more tolerable with those medications.   Objective:   General: no acute distress, appropriate affect Neurologic: alert, answering questions appropriately, following commands Respiratory: unlabored breathing on room air Skin: incision is well healed   MSK (spine):   -Strength exam                                                   Left                  Right   EHL                              5/5                  5/5 TA                                 5/5                  5/5 GSC                             5/5                  5/5 Knee extension            5/5                  5/5 Hip flexion                    5/5                  5/5   -Sensory exam                           Sensation intact to light touch in L3-S1 nerve distributions of bilateral lower extremities   Imaging: None obtained at today's visit      Patient name: Kendra [name] Patient MRN: [id number] Date of visit: 06/03/24

## 2024-06-05 ENCOUNTER — Other Ambulatory Visit: Payer: Self-pay

## 2024-06-05 ENCOUNTER — Telehealth: Payer: Self-pay | Admitting: Family Medicine

## 2024-06-05 DIAGNOSIS — G43709 Chronic migraine without aura, not intractable, without status migrainosus: Secondary | ICD-10-CM

## 2024-06-05 MED ORDER — SOLIFENACIN SUCCINATE 10 MG PO TABS: 10.0000 mg | ORAL_TABLET | Freq: Every day | ORAL | 0 refills | Status: DC

## 2024-06-05 MED ORDER — TOPIRAMATE 50 MG PO TABS: 50.0000 mg | ORAL_TABLET | Freq: Every day | ORAL | 0 refills | Status: DC

## 2024-06-05 NOTE — Telephone Encounter (Signed)
 Prescription Request  06/05/2024  LOV: 05/19/2024  What is the name of the medication or equipment? topiramate  (TOPAMAX ) 50 MG tablet [[id number]]   solifenacin  (VESICARE ) 10 MG tablet [[id number]]    Have you contacted your pharmacy to request a refill? Yes   Which pharmacy would you like this sent to? [facility]    Patient notified that their request is being sent to the clinical staff for review and that they should receive a response within 2 business days.   Please advise at spouse walked into office

## 2024-06-05 NOTE — Telephone Encounter (Signed)
 I have sent in the refills to [facility].

## 2024-06-08 ENCOUNTER — Other Ambulatory Visit (HOSPITAL_COMMUNITY): Payer: Self-pay

## 2024-06-08 ENCOUNTER — Telehealth: Payer: Self-pay | Admitting: Pharmacy Technician

## 2024-06-08 NOTE — Telephone Encounter (Signed)
 Pharmacy Patient Advocate Encounter   Received notification from Onbase that prior authorization for Solifenacin  Succinate 10MG  tablets is required/requested.   Insurance verification completed.   The patient is insured through [employer].   Per test claim: PA required; PA submitted to above mentioned insurance via Latent Key/confirmation #/EOC [id number] Status is pending

## 2024-06-09 ENCOUNTER — Other Ambulatory Visit (HOSPITAL_COMMUNITY): Payer: Self-pay

## 2024-06-09 NOTE — Telephone Encounter (Signed)
 Pharmacy Patient Advocate Encounter  Received notification from [employer] that Prior Authorization for Solifenacin  Succinate 10MG  tablets has been APPROVED from 06/08/2024 to 07/22/2025. Ran test claim, Copay is $5.00. This test claim was processed through [facility]- copay amounts may vary at other pharmacies due to pharmacy/plan contracts, or as the patient moves through the different stages of their insurance plan.   PA #/Case ID/Reference #: [id number]

## 2024-06-22 ENCOUNTER — Encounter: Admitting: Orthopedic Surgery

## 2024-06-24 ENCOUNTER — Other Ambulatory Visit: Payer: Self-pay

## 2024-06-24 ENCOUNTER — Ambulatory Visit: Admitting: Orthopedic Surgery

## 2024-06-24 DIAGNOSIS — Z9889 Other specified postprocedural states: Secondary | ICD-10-CM

## 2024-06-24 NOTE — Progress Notes (Signed)
 Orthopedic Surgery Post-operative Office Visit   Procedure: L4/5 laminectomy Date of Surgery: 03/10/2024 (~3 months post-op)   Assessment: Patient is a 78 y.o. who has resolution of her radiating leg pain.  Still having significant back pain     Plan: -No spine specific precautions -Talked about options for her back pain.  Discussed pain management, fusion type surgery, spinal cord stimulator.  I explained what each of these would entail.  I told her that a fusion surgery would be unpredictable in providing her with relief.  After companies options, she wanted to explore spinal cord stimulator as an option.  She is now status post laminectomy without relief of her back pain so I think she is a reasonable candidate and I placed a referral to have her evaluated by Dr. Lyda office -Return to office 3 months, x-rays needed at next visit: none   ___________________________________________________________________________     Subjective: Patient has not had any return of her leg pain since surgery.  She still has significant low back pain.  She finds that the tramadol  helps her very temporarily.  She notes the pain if she is more active.  If she rests, the pain is not as bad.  She is pleased with how her leg pain is resolved since the surgery but the back pain is now limiting her.   Objective:   General: no acute distress, appropriate affect Neurologic: alert, answering questions appropriately, following commands Respiratory: unlabored breathing on room air Skin: incision is well healed   MSK (spine):   -Strength exam                                                   Left                  Right   EHL                              5/5                  5/5 TA                                 5/5                  5/5 GSC                             5/5                  5/5 Knee extension            5/5                  5/5 Hip flexion                    5/5                  5/5    -Sensory exam                           Sensation intact to light touch in L3-S1 nerve distributions of bilateral  lower extremities   Imaging: XRs of the lumbar spine from 06/24/2024 were independently reviewed and interpreted, showing multiple levels with disc height loss and anterior osteophyte formation.  Most notable at L2/3. No fracture or dislocation seen. No evidence of instability on flexion/extension views.      Patient name: Kendra [name] Patient MRN: [id number] Date of visit: 06/24/24

## 2024-07-06 ENCOUNTER — Encounter: Payer: Self-pay | Admitting: Physical Medicine and Rehabilitation

## 2024-07-06 ENCOUNTER — Ambulatory Visit: Admitting: Physical Medicine and Rehabilitation

## 2024-07-06 DIAGNOSIS — G894 Chronic pain syndrome: Secondary | ICD-10-CM

## 2024-07-06 DIAGNOSIS — M961 Postlaminectomy syndrome, not elsewhere classified: Secondary | ICD-10-CM

## 2024-07-06 DIAGNOSIS — M545 Low back pain, unspecified: Secondary | ICD-10-CM | POA: Diagnosis not present

## 2024-07-06 DIAGNOSIS — G8929 Other chronic pain: Secondary | ICD-10-CM

## 2024-07-06 NOTE — Progress Notes (Unsigned)
 Pain Scale   Average Pain 8 Patient advising she has chronic lower back pain that is constant and she doesn't get any relief        +Driver, -BT, -Dye Allergies.

## 2024-07-06 NOTE — Progress Notes (Unsigned)
 Kendra [name] - 78 y.o. female MRN [id number]  Date of birth: Dec 28, 1945  Office Visit Note: Visit Date: 07/06/2024 PCP: Cook, Jayce G, DO Referred by: Cook, Jayce G, DO  Subjective: Chief Complaint  Patient presents with   Lower Back - Pain   HPI: Kendra [name] is a 78 y.o. female who comes in today per the request of Dr. Ozell Ada for evaluation of chronic, worsening and severe bilateral lower back pain. She is here today to determine candidacy for spinal canal stimulator. Spanish interpreter at bedside. Patient is somewhat of poor historian. Pain started in 2002, worsens with walking and prolonged sitting. States her pain leaves her breathless, currently rates as 8 out of 10. History of multiple lumbar epidural steroid injections with short term relief of pain. Lumbar MRI imaging from March of 2025 shows central/lateral recess narrowing at L4-L5. She underwent L4-L5 decompressive laminectomy with Dr. Ada on 03/10/2024. She reports short term relief of pain post surgery. Some relief of pain with home exercise regimen, rest and use of medications. She does take Tramadol  for severe pain. Per Dr. Jeraline notes he feels a spinal fusion would provide somewhat unpredictable pain relief and does not recommend. She reports difficulty walking, states she is not able to pick up her feet due to pain. Patient denies numbness and tingling. No recent trauma or falls. No bowel/bladder incontinence.      Review of Systems  Musculoskeletal:  Positive for back pain.  Neurological:  Negative for tingling, sensory change, focal weakness and weakness.  All other systems reviewed and are negative.  Otherwise per HPI.  Assessment & Plan: Visit Diagnoses:    ICD-10-CM   1. Chronic bilateral low back pain without sciatica  M54.50    G89.29     2. Post laminectomy syndrome  M96.1     3. Chronic pain syndrome  G89.4        Plan: Findings:  Chronic, worsening and severe bilateral lower back pain.  No radicular symptoms noted. Patient continues to have severe pain despite good conservative therapies such as home exercise regimen, rest and use of medications. Patients clinical presentation and exam are complex. I explained to patient that spinal cord stimulation is a procedure of last resort. She does meet criteria for stimulator placement given her history of failed conservative therapies, failed injection therapy and surgery, however I am not convinced she is ideal candidate. She is a poor historian, very difficult to determine how effective procedures have been for her in the past. She currently denies radicular pain, lower back pain remains. There is also language barrier to consider. The permanent implant could involve minor surgery if placed by surgeon. She is Jehovah's Witness and concerned about the risk of bleeding if second surgical procedure is warranted. I would recommend percutaneous placement with anesthesiology. I did consult with Dr. Eldonna today, he would like some time to discuss her cause with Dr. Ada. I encouraged patient to also take time and speak with her family and continue to research procedure at home. Her exam today was difficult likely due to pain and decreased effort. Antalgic gait noted.     Meds & Orders: No orders of the defined types were placed in this encounter.  No orders of the defined types were placed in this encounter.   Follow-up: Return if symptoms worsen or fail to improve.   Procedures: No procedures performed      Clinical History: MRI LUMBAR SPINE WITHOUT CONTRAST   TECHNIQUE:  Multiplanar, multisequence MR imaging of the lumbar spine was performed. No intravenous contrast was administered.   COMPARISON:  Radiographs 03/04/2023 and report from a prior MRI dated 12/15/2021   FINDINGS: Segmentation: The lowest lumbar type non-rib-bearing vertebra is labeled as L5.   Alignment: 3 mm grade 1 degenerative anterolisthesis at L4-5. 3  mm degenerative retrolisthesis at L2.   Vertebrae: Type 2 degenerative endplate findings at L2-3, L3-4, and L4-5. Broad Schmorl's node along the superior endplate of L5.   Disc desiccation throughout the lumbar spine with loss of disc height most severe at L2-3.   Conus medullaris and cauda equina: Conus extends to the L1 level. Conus and cauda equina appear normal.   Paraspinal and other soft tissues: Parapelvic and parenchymal cysts in the kidneys. No further imaging workup of these lesions is indicated.   Disc levels:   T12-L1: No impingement.  Small central disc protrusion.   L1-2: No impingement. Disc bulge and left paracentral disc protrusion.   L2-3: Mild bilateral foraminal stenosis and mild left subarticular lateral recess stenosis due to disc osteophyte complex and facet arthropathy.   L3-4: Moderate central narrowing of the thecal sac with mild right and borderline left foraminal stenosis as well as borderline bilateral subarticular lateral recess stenosis due to disc bulge and right paracentral disc protrusion along with facet arthropathy.   L4-5: Moderate to prominent left eccentric central narrowing of the thecal sac with mild left foraminal stenosis as well as moderate left and mild right subarticular lateral recess stenosis due to facet arthropathy, ligamentum flavum redundancy, right paracentral disc protrusion, and disc bulge.   L5-S1: Mild left and borderline right subarticular lateral recess stenosis and borderline right foraminal stenosis due to facet arthropathy and disc osteophyte complex. Small left paracentral disc protrusion.   IMPRESSION: 1. Lumbar spondylosis and degenerative disc disease causing moderate to prominent impingement at L4-5; moderate impingement at L3-4; and mild impingement at L2-3 and L5-S1.     Electronically Signed   By: Ryan Salvage M.D.   On: 10/21/2023 18:32   She reports that she has never smoked. She has  never used smokeless tobacco.  Recent Labs    08/15/23 1415  HGBA1C 6.2*    Objective:  VS:  HT:    WT:   BMI:     BP:   HR: bpm  TEMP: ( )  RESP:  Physical Exam Vitals and nursing note reviewed.  HENT:     Head: Normocephalic and atraumatic.     Right Ear: External ear normal.     Left Ear: External ear normal.     Nose: Nose normal.     Mouth/Throat:     Mouth: Mucous membranes are moist.  Eyes:     Extraocular Movements: Extraocular movements intact.  Cardiovascular:     Rate and Rhythm: Normal rate.     Pulses: Normal pulses.  Pulmonary:     Effort: Pulmonary effort is normal.  Abdominal:     General: Abdomen is flat. There is no distension.  Musculoskeletal:        General: Tenderness present.     Cervical back: Normal range of motion.     Comments: Her exam today was difficulty likely due to pain and decreased effort. Patient is slow to rise from seated position to standing. Good lumbar range of motion. No pain noted with facet loading. 5/5 strength noted with bilateral hip flexion, knee flexion/extension, ankle dorsiflexion/plantarflexion and EHL. No clonus noted bilaterally. No pain upon palpation  of greater trochanters. No pain with internal/external rotation of bilateral hips. Sensation intact bilaterally. Negative slump test bilaterally. Antalgic gait noted, uses cane/walker to assist with ambulation.     Skin:    General: Skin is warm and dry.     Capillary Refill: Capillary refill takes less than 2 seconds.  Neurological:     General: No focal deficit present.     Mental Status: She is alert and oriented to person, place, and time.  Psychiatric:        Mood and Affect: Mood normal.        Behavior: Behavior normal.     Ortho Exam  Imaging: No results found.  Past Medical/Family/Surgical/Social History: Medications & Allergies reviewed per EMR, new medications updated. Patient Active Problem List   Diagnosis Date Noted   Annual physical exam  05/20/2024   S/P lumbar laminectomy 03/10/2024   Lichen sclerosus of vulva 02/12/2023   Prediabetes 11/08/2021   Chronic migraine without aura 08/10/2021   Chronic low back pain 08/10/2021   Conversion disorder    History of DVT (deep vein thrombosis) 12/26/2017   Essential hypertension 12/25/2017   Mixed hyperlipidemia 12/25/2017   Depression 03/17/2010   OSA on CPAP 10/12/2009   GERD 03/07/2007   Past Medical History:  Diagnosis Date   Arthritis    Chronic back pain    Conversion disorder    caused by stress of daughter's death in 08/03/10   Depression    GERD (gastroesophageal reflux disease)    History of DVT (deep vein thrombosis)    History of kidney stones    Hypercholesterolemia    Hypertension    Migraine    Panic attacks    daily, worse the 15th of each month   Radiculopathy, lumbar region 03/10/2024   Thyroid  disease    TIA (transient ischemic attack)    Family History  Problem Relation Age of Onset   Arthritis Mother    Migraines Mother    Cancer Sister    Cancer Maternal Grandmother    Past Surgical History:  Procedure Laterality Date   ABDOMINAL HYSTERECTOMY     r ovary removal   APPENDECTOMY  1970   CHOLECYSTECTOMY  08/03/2010   CYSTOSCOPY  1990   DECOMPRESSIVE LUMBAR LAMINECTOMY LEVEL 1 N/A 03/10/2024   Procedure: DECOMPRESSIVE LUMBAR LAMINECTOMY LEVEL 1;  Surgeon: Georgina Ozell LABOR, MD;  Location: [facility] OR;  Service: Orthopedics;  Laterality: N/A;  L4-5 LAMINECTOMY   KIDNEY STONE SURGERY  August 03, 2009   LAPAROSCOPY  1984   LEFT HEART CATH AND CORONARY ANGIOGRAPHY N/A 12/26/2017   Procedure: LEFT HEART CATH AND CORONARY ANGIOGRAPHY;  Surgeon: Court Dorn PARAS, MD;  Location: [facility];  Service: Cardiovascular;  Laterality: N/A;   OVARY SURGERY Left 1980   STERILIZATION  1979   THYROID  SURGERY  2009-08-03   Social History   Occupational History   Not on file  Tobacco Use   Smoking status: Never   Smokeless tobacco: Never  Vaping Use   Vaping status: Never  Used  Substance and Sexual Activity   Alcohol  use: No   Drug use: No   Sexual activity: Never    Birth control/protection: None

## 2024-07-08 ENCOUNTER — Ambulatory Visit: Admitting: Orthopedic Surgery

## 2024-07-08 ENCOUNTER — Other Ambulatory Visit: Payer: Self-pay | Admitting: Family Medicine

## 2024-07-08 ENCOUNTER — Telehealth: Payer: Self-pay

## 2024-07-08 ENCOUNTER — Encounter: Payer: Self-pay | Admitting: Cardiology

## 2024-07-08 ENCOUNTER — Ambulatory Visit: Attending: Cardiology | Admitting: Cardiology

## 2024-07-08 VITALS — BP 115/80 | HR 86 | Ht 63.0 in | Wt 201.0 lb

## 2024-07-08 DIAGNOSIS — G43709 Chronic migraine without aura, not intractable, without status migrainosus: Secondary | ICD-10-CM

## 2024-07-08 DIAGNOSIS — R1013 Epigastric pain: Secondary | ICD-10-CM

## 2024-07-08 DIAGNOSIS — E785 Hyperlipidemia, unspecified: Secondary | ICD-10-CM

## 2024-07-08 MED ORDER — TOPIRAMATE 50 MG PO TABS: 50.0000 mg | ORAL_TABLET | Freq: Every day | ORAL | 0 refills | Status: AC

## 2024-07-08 NOTE — Telephone Encounter (Signed)
 Prescription Request  07/08/2024  LOV: Visit date not found  What is the name of the medication or equipment? topiramate  (TOPAMAX ) 50 MG tablet   Have you contacted your pharmacy to request a refill? Yes   Which pharmacy would you like this sent to?  [facility] [address], [place] - [address] [place] [address] [place] [address] Phone: [phone] Fax: [phone]  [facility] [address] [place] [address] Phone: [phone] Fax: [phone]    Patient notified that their request is being sent to the clinical staff for review and that they should receive a response within 2 business days.   Please advise at Mobile [phone] (mobile)

## 2024-07-08 NOTE — Patient Instructions (Signed)
 Medication Instructions:  Your physician recommends that you continue on your current medications as directed. Please refer to the Current Medication list given to you today.  *If you need a refill on your cardiac medications before your next appointment, please call your pharmacy*  Lab Work: Fasting Lipid Panel If you have labs (blood work) drawn today and your tests are completely normal, you will receive your results only by: MyChart Message (if you have MyChart) OR A paper copy in the mail If you have any lab test that is abnormal or we need to change your treatment, we will call you to review the results.  Testing/Procedures: None  Follow-Up: At [facility], you and your health needs are our priority.  As part of our continuing mission to provide you with exceptional heart care, our providers are all part of one team.  This team includes your primary Cardiologist (physician) and Advanced Practice Providers or APPs (Physician Assistants and Nurse Practitioners) who all work together to provide you with the care you need, when you need it.  Your next appointment:   6 month(s)  Provider:   You may see Dina Rich, MD or one of the following Advanced Practice Providers on your designated Care Team:   Randall An, PA-C  [place], [place] Jacolyn Reedy, [place]     We recommend signing up for the patient portal called "MyChart".  Sign up information is provided on this After Visit Summary.  MyChart is used to connect with patients for Virtual Visits (Telemedicine).  Patients are able to view lab/test results, encounter notes, upcoming appointments, etc.  Non-urgent messages can be sent to your provider as well.   To learn more about what you can do with MyChart, go to [email or web address].   Other Instructions

## 2024-07-08 NOTE — Progress Notes (Unsigned)
 Clinical Summary Kendra [name] is a 78 y.o.female seen today for follow up of the following medical problems.   1.History of chest pain/epigastric pain - 12/2017 cath: 30% prox LAD 10/2020 nuclear stress: no ischemia -05/2021 echo: 55-60%, no WMAs, indet diastolic 01/2022 echo: LVEF 50-55%, no WMAs, grade I dd - 02/2022 nuclear stress: no ischemia - EKG today shows NSR, no acute ischemic changes     - recent chest pain - pressure midchest, 6/10 in severity. No SOB, no nausea or diaphoresis - pain occurs at rest or with activity - worst with deep breathing - will take NG, improves symptoms.  - pain lasts 10 minutes. Occurs every 2 to 3 days - sedentary lifestyle, limited by back pain and knees. Uses walker - simialr to prior chest pains.       01/2024 coronary CTA Calcium  score 1819, 98th percentile. Mild CAD, Normal FFR - still with some chest pains/epigastric.       2.HTN - home bp up to 170/116 at times at home.  Reports bp will then drop down suddenly 98/60s.  - drinking very small amount of water. Sweet tea, orange juice. Overall poor hydration.  - not checking bp correctly at home as far as sitting 5 minutes, feet on floor.  - can get lightheaded when bp gets low.    - while doing physical therapy severe dizziness and fell - frequent orthostatic dizziness.      - checking home bp. Can be high 175 up to 200 - sits for 5 minutes.          3.Palpitations 10/2020 monitor: rare ectopy, isoalted 4 beat run of NSVT - occasional palpitations, somewhat often.  - she ran out of propranolol  2 weeks ago.    4.HTN - compliant with meds   5. Hyperlipidemia 05/2021 TC 174 TG 153 HDL 46 LDL 97 - she is on crestor  40mg  daily, started about 3 months ago Jan 2025 TC 167 TG 122 HDL 55 LDL 90     6. TIA - admission 05/2021   Past Medical History:  Diagnosis Date   Arthritis    Chronic back pain    Conversion disorder    caused by stress of daughter's death in 07/25/2010    Depression    GERD (gastroesophageal reflux disease)    History of DVT (deep vein thrombosis)    History of kidney stones    Hypercholesterolemia    Hypertension    Migraine    Panic attacks    daily, worse the 15th of each month   Radiculopathy, lumbar region 03/10/2024   Thyroid  disease    TIA (transient ischemic attack)      Allergies[1]   Current Outpatient Medications  Medication Sig Dispense Refill   amitriptyline  (ELAVIL ) 50 MG tablet Take 1 tablet (50 mg total) by mouth at bedtime. 90 tablet 1   amLODipine  (NORVASC ) 2.5 MG tablet Take 1 tablet (2.5 mg total) by mouth daily. 90 tablet 0   aspirin  EC 81 MG tablet Take 81 mg by mouth daily.     baclofen  (LIORESAL ) 10 MG tablet Take 0.5 tablets (5 mg total) by mouth 3 (three) times daily as needed for muscle spasms. 30 each 0   celecoxib  (CELEBREX ) 100 MG capsule Take 1 capsule (100 mg total) by mouth 2 (two) times daily. 60 capsule 1   Cholecalciferol (VITAMIN D3) 50 MCG (2000 UT) capsule Take 2,000 Units by mouth daily.     clobetasol  ointment (TEMOVATE )  0.05 % Apply 1 Application topically at bedtime. For 12 weeks. Then twice a week. 60 g 3   nitroGLYCERIN  (NITROSTAT ) 0.4 MG SL tablet Place 1 tablet (0.4 mg total) under the tongue every 5 (five) minutes x 3 doses as needed for chest pain. 30 tablet 1   pantoprazole  (PROTONIX ) 40 MG tablet Take 1 tablet (40 mg total) by mouth daily. 90 tablet 1   propranolol  (INDERAL ) 20 MG tablet Take 1 tablet (20 mg total) by mouth at bedtime. 90 tablet 0   risperiDONE  (RISPERDAL ) 0.5 MG tablet Take 1 tablet (0.5 mg total) by mouth at bedtime. 90 tablet 1   rosuvastatin  (CRESTOR ) 40 MG tablet Take 1 tablet (40 mg total) by mouth daily. 90 tablet 3   sertraline  (ZOLOFT ) 50 MG tablet Take 3 tablets (150 mg total) by mouth at bedtime. 270 tablet 1   solifenacin  (VESICARE ) 10 MG tablet Take 1 tablet (10 mg total) by mouth daily. 30 tablet 0   topiramate  (TOPAMAX ) 50 MG tablet Take 1 tablet  (50 mg total) by mouth daily. 90 tablet 0   No current facility-administered medications for this visit.     Past Surgical History:  Procedure Laterality Date   ABDOMINAL HYSTERECTOMY     r ovary removal   APPENDECTOMY  1970   CHOLECYSTECTOMY  2011   CYSTOSCOPY  1990   DECOMPRESSIVE LUMBAR LAMINECTOMY LEVEL 1 N/A 03/10/2024   Procedure: DECOMPRESSIVE LUMBAR LAMINECTOMY LEVEL 1;  Surgeon: Georgina Ozell LABOR, MD;  Location: [facility] OR;  Service: Orthopedics;  Laterality: N/A;  L4-5 LAMINECTOMY   KIDNEY STONE SURGERY  2010   LAPAROSCOPY  1984   LEFT HEART CATH AND CORONARY ANGIOGRAPHY N/A 12/26/2017   Procedure: LEFT HEART CATH AND CORONARY ANGIOGRAPHY;  Surgeon: Court Dorn PARAS, MD;  Location: [facility];  Service: Cardiovascular;  Laterality: N/A;   OVARY SURGERY Left 1980   STERILIZATION  1979   THYROID  SURGERY  2010     Allergies[2]    Family History  Problem Relation Age of Onset   Arthritis Mother    Migraines Mother    Cancer Sister    Cancer Maternal Grandmother      Social History Kendra [name] reports that she has never smoked. She has never used smokeless tobacco. Kendra [name] reports no history of alcohol  use.   Review of Systems CONSTITUTIONAL: No weight loss, fever, chills, weakness or fatigue.  HEENT: Eyes: No visual loss, blurred vision, double vision or yellow sclerae.No hearing loss, sneezing, congestion, runny nose or sore throat.  SKIN: No rash or itching.  CARDIOVASCULAR:  RESPIRATORY: No shortness of breath, cough or sputum.  GASTROINTESTINAL: No anorexia, nausea, vomiting or diarrhea. No abdominal pain or blood.  GENITOURINARY: No burning on urination, no polyuria NEUROLOGICAL: No headache, dizziness, syncope, paralysis, ataxia, numbness or tingling in the extremities. No change in bowel or bladder control.  MUSCULOSKELETAL: No muscle, back pain, joint pain or stiffness.  LYMPHATICS: No enlarged nodes. No history of splenectomy.  PSYCHIATRIC:  No history of depression or anxiety.  ENDOCRINOLOGIC: No reports of sweating, cold or heat intolerance. No polyuria or polydipsia.  Kendra [name]   Physical Examination There were no vitals filed for this visit. There were no vitals filed for this visit.  Gen: resting comfortably, no acute distress HEENT: no scleral icterus, pupils equal round and reactive, no palptable cervical adenopathy,  CV Resp: Clear to auscultation bilaterally GI: abdomen is soft, non-tender, non-distended, normal bowel sounds, no hepatosplenomegaly MSK: extremities are warm,  no edema.  Skin: warm, no rash Neuro:  no focal deficits Psych: appropriate affect   Diagnostic Studies  10/2020 nuclear stress Lexiscan  stress is electrically negative for ischemia Myoview  scan is electrically negative for ischemia LVEF is calculated at 47% Consider echocardiogram to further define LVEF / wall motion Overall low risk scan       02/2022 nuclear stress The study is intermediate risk based upon reduced ejection fraction of 46%.   No ST deviation was noted.   LV perfusion is normal.  There is significant bowel loop uptake inhibiting inferior wall read accuracy seen at both rest and stress There is no evidence of ischemia. There is no evidence of infarction.   Left ventricular function is abnormal.  Ejection fraction 46% global function is mildly reduced. End diastolic cavity size is normal. End systolic cavity size is normal.  01/2024 coronary CTA Calcium  score 1819, 98th percentile. Mild CAD, Normal FFR  Assessment and Plan  1.Chest pain - long history of chest pain symptoms - prior testing has been benign, most recently 02/2022 nuclear stress - ongoing symptoms, atypical in that they occur at rest or with activity, but do improve with SL NG - plan for coronary CTA for chest pain - EKG today shows SR, no acute ischemic changes.      2. Preoperative evaluation - considering back surgery under general anesthesia.  - unable  to assess exercise capacity by history, limited by severe back pain and knee pains.  - recent chest pains pains. Chest pains require further testing before cardiac clearance, planning for coronary CTA   Labile blood pressure -low bp's likely related to poor oral hydration based on history, though today orthostatics are negative. Does have 16 beat elevation in HR with standing, 12 point drop in SBP.  - encouraged drinking at least 2-3 L of water per day - high bp's may be due to inappropriate bp measurements, discussed sitting 5 minutes with feet on floor and arm at rest. SHe will keep bp log and update us  in 1 week   Dorn PHEBE Ross, M.D., F.A.C.C.     [1]  Allergies Allergen Reactions   Food Anaphylaxis    Melon (green and yellow varieties) - anaphylaxis   Ms Contin [Morphine] Swelling   Pineapple Swelling    Throat swelling  [2]  Allergies Allergen Reactions   Food Anaphylaxis    Melon (green and yellow varieties) - anaphylaxis   Ms Contin [Morphine] Swelling   Pineapple Swelling    Throat swelling

## 2024-07-11 ENCOUNTER — Other Ambulatory Visit: Payer: Self-pay | Admitting: Family Medicine

## 2024-07-17 ENCOUNTER — Other Ambulatory Visit: Payer: Self-pay | Admitting: Family Medicine

## 2024-07-17 DIAGNOSIS — I10 Essential (primary) hypertension: Secondary | ICD-10-CM

## 2024-07-20 ENCOUNTER — Encounter: Payer: Self-pay | Admitting: Internal Medicine

## 2024-07-20 ENCOUNTER — Encounter: Payer: Self-pay | Admitting: Family Medicine

## 2024-08-10 ENCOUNTER — Other Ambulatory Visit: Payer: Self-pay | Admitting: Family Medicine

## 2024-08-11 ENCOUNTER — Ambulatory Visit: Payer: Self-pay | Admitting: Cardiology

## 2024-08-20 ENCOUNTER — Ambulatory Visit (INDEPENDENT_AMBULATORY_CARE_PROVIDER_SITE_OTHER): Admitting: Gastroenterology

## 2024-08-20 ENCOUNTER — Ambulatory Visit: Admitting: Internal Medicine

## 2024-08-21 ENCOUNTER — Other Ambulatory Visit: Payer: Self-pay | Admitting: Family Medicine

## 2024-08-27 ENCOUNTER — Ambulatory Visit: Admitting: Physical Medicine and Rehabilitation

## 2024-08-27 NOTE — Patient Instructions (Signed)
 Ms. Veiga,  Thank you for taking the time for your Medicare Wellness Visit. I appreciate your continued commitment to your health goals. Please review the care plan we discussed, and feel free to reach out if I can assist you further.  Please note that Annual Wellness Visits do not include a physical exam. Some assessments may be limited, especially if the visit was conducted virtually. If needed, we may recommend an in-person follow-up with your provider.  Ongoing Care Seeing your primary care provider every 3 to 6 months helps us  monitor your health and provide consistent, personalized care.   Referrals If a referral was made during today's visit and you haven't received any updates within two weeks, please contact the referred provider directly to check on the status.  Recommended Screenings:  Health Maintenance  Topic Date Due   Medicare Annual Wellness Visit  Never done   DTaP/Tdap/Td vaccine (1 - Tdap) Never done   Zoster (Shingles) Vaccine (1 of 2) Never done   COVID-19 Vaccine (1 - 2025-26 season) Never done   Pneumococcal Vaccine for age over 61  Completed   Flu Shot  Completed   Osteoporosis screening with Bone Density Scan  Completed   Meningitis B Vaccine  Aged Out   Breast Cancer Screening  Discontinued   Hepatitis C Screening  Discontinued       03/05/2024    1:47 PM  Advanced Directives  Does Patient Have a Medical Advance Directive? No  Would patient like information on creating a medical advance directive? Yes (MAU/Ambulatory/Procedural Areas - Information given)   Information on Advanced Care Planning can be found at [place]  Secretary of [facility] Advance Health Care Directives Advance Health Care Directives ([email or web address]   Vision: Annual vision screenings are recommended for early detection of glaucoma, cataracts, and diabetic retinopathy. These exams can also reveal signs of chronic conditions such as diabetes and high blood pressure.  Dental: Annual  dental screenings help detect early signs of oral cancer, gum disease, and other conditions linked to overall health, including heart disease and diabetes.  Please see the attached documents for additional preventive care recommendations.

## 2024-08-28 ENCOUNTER — Ambulatory Visit

## 2024-08-28 VITALS — BP 112/70 | HR 84 | Ht 63.0 in | Wt 200.0 lb

## 2024-08-28 DIAGNOSIS — Z0000 Encounter for general adult medical examination without abnormal findings: Secondary | ICD-10-CM

## 2024-08-28 NOTE — Progress Notes (Cosign Needed)
 "  Chief Complaint  Patient presents with   Medicare Wellness     Subjective:   Kendra [name] is a 79 y.o. female who presents for a Medicare Annual Wellness Visit.  Visit info / Clinical Intake: Medicare Wellness Visit Type:: Initial Annual Wellness Visit Persons participating in visit and providing information:: patient & caregiver (husband) Medicare Wellness Visit Mode:: In-person (required for [employer]) Interpreter Needed?: Yes Interpreter Agency: [employer] Interpreter Interpreter Name: Maryelizabeth Patient Declined Interpreter : No Interpretation services provided by: [place] Interpreter Patient signed [place] waiver: No Pre-visit prep was completed: yes AWV questionnaire completed by patient prior to visit?: no Living arrangements:: lives with spouse/significant other Patient's Overall Health Status Rating: (!) fair Typical amount of pain: some Does pain affect daily life?: (!) yes Are you currently prescribed opioids?: no  Dietary Habits and Nutritional Risks How many meals a day?: 3 Eats fruit and vegetables daily?: yes Most meals are obtained by: having others provide food In the last 2 weeks, have you had any of the following?: none Diabetic:: no  Functional Status Activities of Daily Living (to include ambulation/medication): (!) Needs Assist Feeding: Independent Dressing/Grooming: Needs assistance Bathing: Needs assistance Toileting: Needs assistance Transfer: Needs assistance Ambulation: Independent with device- listed below Home Assistive Devices/Equipment: Walker (specify Type) Medication Administration: Needs assistance (comment) Is this a change from baseline?: Pre-admission baseline Home Management (perform basic housework or laundry): Needs assistance (comment) Manage your own finances?: (!) no Primary transportation is: family / friends Concerns about vision?: no *vision screening is required for [facility]* Concerns about hearing?: no  Fall  Screening Falls in the past year?: 0 Number of falls in past year: 0 Was there an injury with Fall?: 0 Fall Risk Category Calculator: 0 Patient Fall Risk Level: Low Fall Risk  Fall Risk Patient at Risk for Falls Due to: Impaired mobility; Impaired balance/gait Fall risk Follow up: Education provided; Falls prevention discussed; Falls evaluation completed  Home and Transportation Safety: All rugs have non-skid backing?: yes All stairs or steps have railings?: yes Grab bars in the bathtub or shower?: yes Have non-skid surface in bathtub or shower?: yes Good home lighting?: yes Regular seat belt use?: yes Hospital stays in the last year:: (!) yes How many hospital stays:: 1 Reason: surgery  Cognitive Assessment Difficulty concentrating, remembering, or making decisions? : no Will 6CIT or Mini Cog be Completed: no 6CIT or Mini Cog Declined: patient alert, oriented, able to answer questions appropriately and recall recent events  Advance Directives (For Healthcare) Does Patient Have a Medical Advance Directive?: Yes Does patient want to make changes to medical advance directive?: Yes (MAU/Ambulatory/Procedural Areas - Information given) Type of Advance Directive: Healthcare Power of Attorney Copy of Healthcare Power of Attorney in Chart?: Yes - validated most recent copy scanned in chart (See row information) Would patient like information on creating a medical advance directive?: Yes (MAU/Ambulatory/Procedural Areas - Information given)  Reviewed/Updated  Reviewed/Updated: Reviewed All (Medical, Surgical, Family, Medications, Allergies, Care Teams, Patient Goals)    Allergies (verified) Food, Ms contin [morphine], and Pineapple   Current Medications (verified) Outpatient Encounter Medications as of 08/28/2024  Medication Sig   amitriptyline  (ELAVIL ) 50 MG tablet TAKE 1 TABLET BY MOUTH AT BEDTIME   amLODipine  (NORVASC ) 2.5 MG tablet Take 1 tablet by mouth once daily   aspirin   EC 81 MG tablet Take 81 mg by mouth daily.   baclofen  (LIORESAL ) 10 MG tablet Take 0.5 tablets (5 mg total) by mouth 3 (three)  times daily as needed for muscle spasms.   celecoxib  (CELEBREX ) 100 MG capsule Take 1 capsule (100 mg total) by mouth 2 (two) times daily.   Cholecalciferol (VITAMIN D3) 50 MCG (2000 UT) capsule Take 2,000 Units by mouth daily.   clobetasol  ointment (TEMOVATE ) 0.05 % Apply 1 Application topically at bedtime. For 12 weeks. Then twice a week.   nitroGLYCERIN  (NITROSTAT ) 0.4 MG SL tablet Place 1 tablet (0.4 mg total) under the tongue every 5 (five) minutes x 3 doses as needed for chest pain.   pantoprazole  (PROTONIX ) 40 MG tablet Take 1 tablet (40 mg total) by mouth daily.   propranolol  (INDERAL ) 20 MG tablet TAKE 1 TABLET BY MOUTH AT BEDTIME   risperiDONE  (RISPERDAL ) 0.5 MG tablet TAKE 1 TABLET BY MOUTH AT BEDTIME   rosuvastatin  (CRESTOR ) 40 MG tablet Take 1 tablet (40 mg total) by mouth daily.   sertraline  (ZOLOFT ) 50 MG tablet Take 3 tablets (150 mg total) by mouth at bedtime.   solifenacin  (VESICARE ) 10 MG tablet Take 1 tablet by mouth once daily   topiramate  (TOPAMAX ) 50 MG tablet Take 1 tablet (50 mg total) by mouth daily.   No facility-administered encounter medications on file as of 08/28/2024.    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 in 09/10/09   Depression    GERD (gastroesophageal reflux disease)    History of DVT (deep vein thrombosis)    History of kidney stones    Hypercholesterolemia    Hypertension    Migraine    Panic attacks    daily, worse the 15th of each month   Radiculopathy, lumbar region 03/10/2024   Thyroid  disease    TIA (transient ischemic attack)    Past Surgical History:  Procedure Laterality Date   ABDOMINAL HYSTERECTOMY     r ovary removal   APPENDECTOMY  1970   CHOLECYSTECTOMY  09-10-2009   CYSTOSCOPY  1990   DECOMPRESSIVE  LUMBAR LAMINECTOMY LEVEL 1 N/A 03/10/2024   Procedure: DECOMPRESSIVE LUMBAR LAMINECTOMY LEVEL 1;  Surgeon: Georgina Ozell LABOR, MD;  Location: [facility] OR;  Service: Orthopedics;  Laterality: N/A;  L4-5 LAMINECTOMY   KIDNEY STONE SURGERY  2008-09-10   LAPAROSCOPY  1984   LEFT HEART CATH AND CORONARY ANGIOGRAPHY N/A 12/26/2017   Procedure: LEFT HEART CATH AND CORONARY ANGIOGRAPHY;  Surgeon: Court Dorn PARAS, MD;  Location: [facility] INVASIVE CV [facility];  Service: Cardiovascular;  Laterality: N/A;   OVARY SURGERY Left 1980   STERILIZATION  1979   THYROID  SURGERY  Sep 10, 2008   Family History  Problem Relation Age of Onset   Arthritis Mother    Migraines Mother    Cancer Sister    Cancer Maternal Grandmother    Social History   Occupational History   Not on file  Tobacco Use   Smoking status: Never   Smokeless tobacco: Never  Vaping Use   Vaping status: Never Used  Substance and Sexual Activity   Alcohol  use: No   Drug use: No   Sexual activity: Never    Birth control/protection: None   Tobacco Counseling Counseling given: Not Answered  SDOH Screenings   Food Insecurity: No Food Insecurity (08/28/2024)  Housing: Low Risk (08/28/2024)  Transportation Needs: No Transportation Needs (08/28/2024)  Utilities: Not At Risk (08/28/2024)  Depression (PHQ2-9): Medium Risk (08/28/2024)  Physical Activity: Inactive (08/28/2024)  Social Connections: Moderately Integrated (08/28/2024)  Stress: No Stress Concern Present (08/28/2024)  Tobacco Use: Low Risk (08/28/2024)  Health Literacy: Adequate Health Literacy (08/28/2024)   See flowsheets for full screening details  Depression Screen PHQ 2 & 9 Depression Scale- Over the past 2 weeks, how often have you been bothered by any of the following problems? Little interest or pleasure in doing things: 1 Feeling down, depressed, or hopeless (PHQ Adolescent also includes...irritable): 2 PHQ-2 Total Score: 3 Trouble falling or staying asleep, or sleeping too much: 2 Feeling tired or having  little energy: 2 Poor appetite or overeating (PHQ Adolescent also includes...weight loss): 0 Feeling bad about yourself - or that you are a failure or have let yourself or your family down: 1 Trouble concentrating on things, such as reading the newspaper or watching television (PHQ Adolescent also includes...like school work): 0 Moving or speaking so slowly that other people could have noticed. Or the opposite - being so fidgety or restless that you have been moving around a lot more than usual: 0 Thoughts that you would be better off dead, or of hurting yourself in some way: 0 PHQ-9 Total Score: 8  Depression Treatment Depression Interventions/Treatment : Currently on Treatment; Medication     Goals Addressed             This Visit's Progress    Complete next back surgery   On track            Objective:    Today's Vitals   08/28/24 1015  BP: 112/70  Pulse: 84  SpO2: 97%  Weight: 200 lb (90.7 kg)  Height: 5' 3 (1.6 m)   Body mass index is 35.43 kg/m.  Hearing/Vision screen No results found. Immunizations and Health Maintenance Health Maintenance  Topic Date Due   DTaP/Tdap/Td (1 - Tdap) Never done   Zoster Vaccines- Shingrix (1 of 2) Never done   COVID-19 Vaccine (1 - 2025-26 season) Never done   Medicare Annual Wellness (AWV)  08/28/2025   Pneumococcal Vaccine: 50+ Years  Completed   Influenza Vaccine  Completed   Bone Density Scan  Completed   Meningococcal B Vaccine  Aged Out   Mammogram  Discontinued   Hepatitis C Screening  Discontinued        Assessment/Plan:  This is a routine wellness examination for Kendra [name].  Patient Care Team: Cook, Jayce G, DO as PCP - General (Family Medicine) Alvan, Dorn FALCON, MD as PCP - Cardiology (Cardiology) Foley, Val LABOR, FNP (Nurse Practitioner) Georgina Ozell LABOR, MD as Consulting Physician (Orthopedic Surgery)  I have personally reviewed and noted the following in the patients chart:   Medical and social  history Use of alcohol , tobacco or illicit drugs  Current medications and supplements including opioid prescriptions. Functional ability and status Nutritional status Physical activity Advanced directives List of other physicians Hospitalizations, surgeries, and ER visits in previous 12 months Vitals Screenings to include cognitive, depression, and falls Referrals and appointments  No orders of the defined types were placed in this encounter.  In addition, I have reviewed and discussed with patient certain preventive protocols, quality metrics, and best practice recommendations. A written personalized care plan for preventive services as well as general preventive health recommendations were provided to patient.   Kendra Charmaine Browner, LPN   01/23/7972   Return in 1 year (on 08/28/2025).  After Visit Summary: (In Person-Printed) AVS printed and given to the patient  Nurse Notes: Patient advised to keep follow-up appointment with PCP (11/17/24) Separate telephone note sent for (  patient concern with tremor episodes)  "

## 2024-09-30 ENCOUNTER — Ambulatory Visit: Admitting: Orthopedic Surgery

## 2024-10-07 ENCOUNTER — Ambulatory Visit: Admitting: Gastroenterology

## 2024-10-12 ENCOUNTER — Ambulatory Visit (INDEPENDENT_AMBULATORY_CARE_PROVIDER_SITE_OTHER): Admitting: Gastroenterology

## 2024-11-17 ENCOUNTER — Ambulatory Visit: Admitting: Family Medicine
